# Patient Record
Sex: Male | Born: 1960 | Race: White | Hispanic: No | Marital: Single | Smoking: Current every day smoker
Health system: Southern US, Community
[De-identification: ages and names within clinical notes are randomized; demographics above are authoritative.]

## PROBLEM LIST (undated history)

## (undated) DIAGNOSIS — F1910 Other psychoactive substance abuse, uncomplicated: Secondary | ICD-10-CM

## (undated) DIAGNOSIS — F4310 Post-traumatic stress disorder, unspecified: Secondary | ICD-10-CM

## (undated) DIAGNOSIS — R51 Headache: Secondary | ICD-10-CM

## (undated) DIAGNOSIS — F419 Anxiety disorder, unspecified: Secondary | ICD-10-CM

## (undated) DIAGNOSIS — E785 Hyperlipidemia, unspecified: Secondary | ICD-10-CM

## (undated) DIAGNOSIS — G4700 Insomnia, unspecified: Secondary | ICD-10-CM

## (undated) DIAGNOSIS — F99 Mental disorder, not otherwise specified: Secondary | ICD-10-CM

## (undated) DIAGNOSIS — I2510 Atherosclerotic heart disease of native coronary artery without angina pectoris: Secondary | ICD-10-CM

## (undated) DIAGNOSIS — I10 Essential (primary) hypertension: Secondary | ICD-10-CM

## (undated) DIAGNOSIS — I219 Acute myocardial infarction, unspecified: Secondary | ICD-10-CM

## (undated) DIAGNOSIS — F329 Major depressive disorder, single episode, unspecified: Secondary | ICD-10-CM

## (undated) DIAGNOSIS — F1010 Alcohol abuse, uncomplicated: Secondary | ICD-10-CM

## (undated) DIAGNOSIS — F32A Depression, unspecified: Secondary | ICD-10-CM

## (undated) DIAGNOSIS — C4490 Unspecified malignant neoplasm of skin, unspecified: Secondary | ICD-10-CM

## (undated) HISTORY — PX: TONSILLECTOMY AND ADENOIDECTOMY: SUR1326

## (undated) HISTORY — DX: Insomnia, unspecified: G47.00

---

## 2003-09-01 ENCOUNTER — Other Ambulatory Visit: Payer: Self-pay

## 2005-06-25 ENCOUNTER — Inpatient Hospital Stay: Payer: Self-pay | Admitting: Internal Medicine

## 2005-06-25 ENCOUNTER — Other Ambulatory Visit: Payer: Self-pay

## 2005-12-01 ENCOUNTER — Inpatient Hospital Stay: Payer: Self-pay | Admitting: Internal Medicine

## 2007-01-03 ENCOUNTER — Ambulatory Visit: Payer: Self-pay | Admitting: Family Medicine

## 2008-09-21 ENCOUNTER — Emergency Department: Payer: Self-pay | Admitting: Internal Medicine

## 2008-09-23 ENCOUNTER — Emergency Department: Payer: Self-pay | Admitting: Emergency Medicine

## 2011-05-29 ENCOUNTER — Encounter (HOSPITAL_COMMUNITY): Payer: Self-pay | Admitting: *Deleted

## 2011-05-29 ENCOUNTER — Other Ambulatory Visit: Payer: Self-pay

## 2011-05-29 ENCOUNTER — Inpatient Hospital Stay (HOSPITAL_COMMUNITY)
Admission: EM | Admit: 2011-05-29 | Discharge: 2011-06-02 | DRG: 247 | Disposition: A | Discharge status: Home / Self Care | Payer: 59 | Attending: Cardiology | Admitting: Cardiology

## 2011-05-29 ENCOUNTER — Encounter (HOSPITAL_COMMUNITY)
Admission: EM | Disposition: A | Discharge status: Home / Self Care | Payer: Self-pay | Source: Home / Self Care | Attending: Cardiology

## 2011-05-29 ENCOUNTER — Emergency Department (HOSPITAL_COMMUNITY): Payer: Self-pay

## 2011-05-29 DIAGNOSIS — I213 ST elevation (STEMI) myocardial infarction of unspecified site: Secondary | ICD-10-CM | POA: Diagnosis present

## 2011-05-29 DIAGNOSIS — I2119 ST elevation (STEMI) myocardial infarction involving other coronary artery of inferior wall: Principal | ICD-10-CM | POA: Diagnosis present

## 2011-05-29 DIAGNOSIS — Z720 Tobacco use: Secondary | ICD-10-CM | POA: Diagnosis present

## 2011-05-29 DIAGNOSIS — F17200 Nicotine dependence, unspecified, uncomplicated: Secondary | ICD-10-CM | POA: Diagnosis present

## 2011-05-29 DIAGNOSIS — I2510 Atherosclerotic heart disease of native coronary artery without angina pectoris: Secondary | ICD-10-CM | POA: Diagnosis present

## 2011-05-29 DIAGNOSIS — F1011 Alcohol abuse, in remission: Secondary | ICD-10-CM | POA: Diagnosis present

## 2011-05-29 HISTORY — PX: LEFT HEART CATH: SHX5478

## 2011-05-29 HISTORY — DX: Depression, unspecified: F32.A

## 2011-05-29 HISTORY — DX: Mental disorder, not otherwise specified: F99

## 2011-05-29 HISTORY — DX: Anxiety disorder, unspecified: F41.9

## 2011-05-29 HISTORY — DX: Atherosclerotic heart disease of native coronary artery without angina pectoris: I25.10

## 2011-05-29 HISTORY — DX: Unspecified malignant neoplasm of skin, unspecified: C44.90

## 2011-05-29 HISTORY — DX: Headache: R51

## 2011-05-29 HISTORY — DX: Major depressive disorder, single episode, unspecified: F32.9

## 2011-05-29 HISTORY — DX: Alcohol abuse, uncomplicated: F10.10

## 2011-05-29 LAB — BASIC METABOLIC PANEL
BUN: 9 mg/dL (ref 6–23)
CO2: 24 mEq/L (ref 19–32)
Chloride: 103 mEq/L (ref 96–112)
Creatinine, Ser: 1.11 mg/dL (ref 0.50–1.35)
GFR calc Af Amer: 88 mL/min — ABNORMAL LOW (ref >90)
Potassium: 3.2 mEq/L — ABNORMAL LOW (ref 3.5–5.1)

## 2011-05-29 LAB — CK: Total CK: 70 U/L (ref 7–232)

## 2011-05-29 LAB — CBC
HCT: 44.1 % (ref 39.0–52.0)
MCV: 90 fL (ref 78.0–100.0)
Platelets: 228 10*3/uL (ref 150–400)
RBC: 4.9 MIL/uL (ref 4.22–5.81)
WBC: 9.3 10*3/uL (ref 4.0–10.5)

## 2011-05-29 LAB — TROPONIN I: Troponin I: 0.36 ng/mL (ref <0.30)

## 2011-05-29 SURGERY — LEFT HEART CATH
Anesthesia: LOCAL | Laterality: Right

## 2011-05-29 SURGERY — LEFT HEART CATH
Anesthesia: LOCAL | Site: Groin | Laterality: Right

## 2011-05-29 MED ORDER — EPTIFIBATIDE BOLUS VIA INFUSION: 180.0000 ug/kg | Freq: Once | INTRAVENOUS | Status: DC

## 2011-05-29 MED ORDER — NITROGLYCERIN IN D5W 200-5 MCG/ML-% IV SOLN
2.0000 ug/min | INTRAVENOUS | Status: DC
  Administered 2011-05-29: 5 ug/min via INTRAVENOUS

## 2011-05-29 MED ORDER — SODIUM CHLORIDE 0.9 % IJ SOLN
3.0000 mL | Freq: Two times a day (BID) | INTRAMUSCULAR | Status: DC
  Administered 2011-05-30 – 2011-06-01 (×4): 3 mL via INTRAVENOUS

## 2011-05-29 MED ORDER — DIAZEPAM 2 MG PO TABS
2.0000 mg | ORAL_TABLET | ORAL | Status: DC | PRN
  Administered 2011-05-29 – 2011-05-31 (×5): 2 mg via ORAL
  Filled 2011-05-29 (×6): qty 1

## 2011-05-29 MED ORDER — TRAZODONE HCL 150 MG PO TABS
300.0000 mg | ORAL_TABLET | Freq: Every evening | ORAL | Status: DC | PRN
Start: 2011-05-29 — End: 2011-06-02
  Administered 2011-05-30 – 2011-06-01 (×4): 300 mg via ORAL
  Filled 2011-05-29 (×5): qty 2

## 2011-05-29 MED ORDER — NICOTINE 14 MG/24HR TD PT24
14.0000 mg | MEDICATED_PATCH | TRANSDERMAL | Status: DC
  Administered 2011-05-30 – 2011-06-01 (×3): 14 mg via TRANSDERMAL
  Filled 2011-05-29 (×5): qty 1

## 2011-05-29 MED ORDER — HEPARIN SODIUM (PORCINE) 1000 UNIT/ML IJ SOLN
INTRAMUSCULAR | Status: AC
  Filled 2011-05-29: qty 1

## 2011-05-29 MED ORDER — ASPIRIN 325 MG PO TABS
325.0000 mg | ORAL_TABLET | Freq: Once | ORAL | Status: AC
  Administered 2011-05-29: 325 mg via ORAL

## 2011-05-29 MED ORDER — ATROPINE SULFATE 1 MG/ML IJ SOLN
INTRAMUSCULAR | Status: AC
  Filled 2011-05-29: qty 1

## 2011-05-29 MED ORDER — MORPHINE SULFATE 2 MG/ML IJ SOLN
2.0000 mg | Freq: Four times a day (QID) | INTRAMUSCULAR | Status: DC | PRN
  Administered 2011-05-30: 2 mg via INTRAVENOUS
  Administered 2011-05-30: 4 mg via INTRAVENOUS
  Administered 2011-05-30 – 2011-06-01 (×5): 2 mg via INTRAVENOUS
  Filled 2011-05-29 (×6): qty 1
  Filled 2011-05-29: qty 2
  Filled 2011-05-29 (×2): qty 1

## 2011-05-29 MED ORDER — HEPARIN SODIUM (PORCINE) 1000 UNIT/ML IJ SOLN
4000.0000 [IU] | Freq: Once | INTRAMUSCULAR | Status: AC
  Administered 2011-05-29: 4000 [IU] via INTRAVENOUS

## 2011-05-29 MED ORDER — HYDROXYZINE HCL 25 MG PO TABS
50.0000 mg | ORAL_TABLET | Freq: Four times a day (QID) | ORAL | Status: DC | PRN
  Administered 2011-05-30 – 2011-06-02 (×3): 50 mg via ORAL
  Filled 2011-05-29: qty 1
  Filled 2011-05-29: qty 2
  Filled 2011-05-29: qty 1
  Filled 2011-05-29: qty 2

## 2011-05-29 MED ORDER — METOPROLOL TARTRATE 1 MG/ML IV SOLN
INTRAVENOUS | Status: AC
  Filled 2011-05-29: qty 5

## 2011-05-29 MED ORDER — ONDANSETRON HCL 4 MG/2ML IJ SOLN: 4.0000 mg | Freq: Four times a day (QID) | INTRAMUSCULAR | Status: DC | PRN

## 2011-05-29 MED ORDER — SODIUM CHLORIDE 0.9 % IV SOLN
1.0000 mL/kg/h | INTRAVENOUS | Status: AC
  Administered 2011-05-29 – 2011-05-30 (×2): 1 mL/kg/h via INTRAVENOUS

## 2011-05-29 MED ORDER — PAROXETINE HCL 10 MG PO TABS
10.0000 mg | ORAL_TABLET | Freq: Every day | ORAL | Status: DC
  Administered 2011-05-30 – 2011-06-02 (×4): 10 mg via ORAL
  Filled 2011-05-29 (×4): qty 1

## 2011-05-29 MED ORDER — EPTIFIBATIDE 75 MG/100ML IV SOLN
INTRAVENOUS | Status: AC
  Filled 2011-05-29: qty 100

## 2011-05-29 MED ORDER — FENTANYL CITRATE 0.05 MG/ML IJ SOLN
INTRAMUSCULAR | Status: AC
  Administered 2011-05-29: 25 ug
  Filled 2011-05-29: qty 2

## 2011-05-29 MED ORDER — CLOPIDOGREL BISULFATE 300 MG PO TABS
600.0000 mg | ORAL_TABLET | Freq: Once | ORAL | Status: AC
  Administered 2011-05-29: 600 mg via ORAL
  Filled 2011-05-29: qty 2

## 2011-05-29 MED ORDER — MIDAZOLAM HCL 2 MG/2ML IJ SOLN
INTRAMUSCULAR | Status: AC
  Filled 2011-05-29: qty 2

## 2011-05-29 MED ORDER — EPTIFIBATIDE BOLUS VIA INFUSION
180.0000 ug/kg | Freq: Once | INTRAVENOUS | Status: DC
  Filled 2011-05-29: qty 23

## 2011-05-29 MED ORDER — ASPIRIN 81 MG PO CHEW
81.0000 mg | CHEWABLE_TABLET | Freq: Every day | ORAL | Status: DC
  Administered 2011-05-30 – 2011-06-02 (×4): 81 mg via ORAL
  Filled 2011-05-29 (×3): qty 1

## 2011-05-29 MED ORDER — QUETIAPINE FUMARATE 100 MG PO TABS
100.0000 mg | ORAL_TABLET | Freq: Every day | ORAL | Status: DC
  Administered 2011-05-29 – 2011-06-01 (×4): 100 mg via ORAL
  Filled 2011-05-29 (×5): qty 1

## 2011-05-29 MED ORDER — SODIUM CHLORIDE 0.9 % IJ SOLN: 3.0000 mL | INTRAMUSCULAR | Status: DC | PRN

## 2011-05-29 MED ORDER — HEPARIN (PORCINE) IN NACL 100-0.45 UNIT/ML-% IJ SOLN: 14.0000 [IU]/kg/h | Freq: Once | INTRAMUSCULAR | Status: DC

## 2011-05-29 MED ORDER — MORPHINE SULFATE 2 MG/ML IJ SOLN
INTRAMUSCULAR | Status: AC
  Administered 2011-05-29: 2 mg via INTRAVENOUS
  Filled 2011-05-29: qty 1

## 2011-05-29 MED ORDER — FENTANYL CITRATE 0.05 MG/ML IJ SOLN
INTRAMUSCULAR | Status: AC
  Filled 2011-05-29: qty 2

## 2011-05-29 MED ORDER — METOPROLOL TARTRATE 1 MG/ML IV SOLN: 2.5000 mg | Freq: Once | INTRAVENOUS | Status: DC

## 2011-05-29 MED ORDER — PRASUGREL HCL 10 MG PO TABS
10.0000 mg | ORAL_TABLET | Freq: Every day | ORAL | Status: DC
  Administered 2011-05-30 – 2011-06-02 (×4): 10 mg via ORAL
  Filled 2011-05-29 (×4): qty 1

## 2011-05-29 MED ORDER — LIDOCAINE HCL (PF) 1 % IJ SOLN
INTRAMUSCULAR | Status: AC
  Filled 2011-05-29: qty 30

## 2011-05-29 MED ORDER — EPTIFIBATIDE 75 MG/100ML IV SOLN
1.0000 ug/kg/min | INTRAVENOUS | Status: AC
  Administered 2011-05-30: 2 ug/kg/min via INTRAVENOUS
  Administered 2011-05-30: 1 ug/kg/min via INTRAVENOUS
  Administered 2011-05-30: 0.997 ug/kg/min via INTRAVENOUS
  Filled 2011-05-29 (×6): qty 100

## 2011-05-29 MED ORDER — HEPARIN (PORCINE) IN NACL 2-0.9 UNIT/ML-% IJ SOLN
INTRAMUSCULAR | Status: AC
  Filled 2011-05-29: qty 2000

## 2011-05-29 MED ORDER — NITROGLYCERIN 0.2 MG/ML ON CALL CATH LAB
INTRAVENOUS | Status: AC
  Filled 2011-05-29: qty 1

## 2011-05-29 MED ORDER — NITROGLYCERIN IN D5W 200-5 MCG/ML-% IV SOLN
INTRAVENOUS | Status: AC
  Filled 2011-05-29: qty 250

## 2011-05-29 MED ORDER — VILAZODONE HCL 40 MG PO TABS
40.0000 mg | ORAL_TABLET | Freq: Every day | ORAL | Status: DC
Start: 2011-05-30 — End: 2011-06-02
  Administered 2011-05-30 – 2011-06-02 (×4): 40 mg via ORAL
  Filled 2011-05-29 (×4): qty 1

## 2011-05-29 MED ORDER — EPTIFIBATIDE 75 MG/100ML IV SOLN
INTRAVENOUS | Status: AC
  Administered 2011-05-30: 0.997 ug/kg/min via INTRAVENOUS
  Filled 2011-05-29: qty 100

## 2011-05-29 MED ORDER — SODIUM CHLORIDE 0.9 % IV SOLN: 250.0000 mL | INTRAVENOUS | Status: DC

## 2011-05-29 MED ORDER — ACETAMINOPHEN 325 MG PO TABS
650.0000 mg | ORAL_TABLET | ORAL | Status: DC | PRN
  Administered 2011-05-30 – 2011-06-01 (×3): 650 mg via ORAL
  Filled 2011-05-29 (×4): qty 2

## 2011-05-29 MED ORDER — ROSUVASTATIN CALCIUM 40 MG PO TABS
40.0000 mg | ORAL_TABLET | Freq: Every day | ORAL | Status: DC
  Administered 2011-05-29 – 2011-06-01 (×4): 40 mg via ORAL
  Filled 2011-05-29 (×5): qty 1

## 2011-05-29 MED ORDER — METOPROLOL TARTRATE 25 MG PO TABS
25.0000 mg | ORAL_TABLET | Freq: Two times a day (BID) | ORAL | Status: DC
  Administered 2011-05-29 – 2011-05-31 (×4): 25 mg via ORAL
  Filled 2011-05-29 (×4): qty 1

## 2011-05-29 NOTE — ED Notes (Signed)
Family notified pt here

## 2011-05-29 NOTE — Op Note (Signed)
[facility] CARDIAC CATHETERIZATION REPORT  NAME:  Larry French     MRN: [id number] DATE OF BIRTH:  23-May-1960    ADMIT DATE:  05/29/2011  Performing Cardiologist: Marykay Lex, M.D., M.S. Primary Physician: No primary provider on file. Primary Cardiologist:  Marykay Lex , MD, MS  Procedures Performed:  Left Heart Catheterization via 6 Fr Right Common Femoral Artery access  Native Coronary Angiography  IC NTG Injection  Difficult/Complex Percutaneous Coronary Artery Intervention on mid Right Coronary Artery with a 2.25 mm x 16 mm Promus DES; final diameter 2.45 mm  Indication(s): Inferior STEMI  History: 51 y.o. male no prior cardiac history the long standing tobacco abuse history who has noted some right arm and jaw pain over last couple days but has otherwise been in his usual state of health until about 4:00 this afternoon (05/29/2011) when he began having more constant pain radiating to his right arm. After 2 hours his symptoms he called EMS and was given nitroglycerin, improving his pain from 10/10 to 5/10. ECG per EMS demonstrated significant inferior ST elevations consistent with STEMI. Code STEMI was called during transport. He arrived at the emergency room at 1834 hours and ECG  confirmed inferior STEMI. At the time of the activating the code STEMI for this patient there was already 1 patient on the table for a complex ST elevation MI procedure. A second call team was called and for this procedure as well as myself as a backup Interventional Cardiologist. There was a significant delay of at least 35 minutes of the time the patient arrived in the emergency room until there was enough Cath Lab staff available for second team.   Reasons for Non--System Delay:    Prompt arrival the patient emergency room with having to wait for second Cath Lab staff.  Extreme difficulty engaging the Right Coronary Artery with a guide adequate for wiring the  artery.  Despite these delays and ECG with ST elevations upon arrival to the Cath Lab he did have a brief spell where he mentioned that his symptoms were significantly improved and his ECG did so and show some improvement on telemetry. This likely reflected temporary reperfusion following the Heparin and Aspirin/Plavix administered in the Emergency Room.   Consent:  Emergency consent was implied. I did discuss the risks benefits alternatives and indications of procedure with the patient and he did agree to proceed with verbal consent.  Risks / Complications include, but Not limited to: Death, MI, CVA/TIA, VF/VT (with defibrillation), Bradycardia (need for temporary pacer placement), contrast induced nephropathy, bleeding / bruising / hematoma / pseudoaneurysm, vascular or coronary injury (with possible emergent CT or Vascular Surgery), adverse medication reactions, infection.    Procedure: The patient was brought to the 2nd Floor [facility] C[facility]ter[facility] Lab in the fasting state and prepped and draped in the usual sterile fashion for Right groin access. Sterile technique was used including antiseptics, cap, gloves, gown, hand hygiene, mask and sheet.  Skin prep: Chlorhexidine;  Time Out: Verified patient identification, verified procedure, site/side was marked, verified correct patient position, special equipment/implants available, medications/allergies/relevent history reviewed, required imaging and test results available.  Performed  The right femoral head was identified using tactile and fluoroscopic technique.  The right groin was anesthetized with 1% subcutaneous Lidocaine.  The right Common Femoral Artery was accessed using the Modified Seldinger Technique with placement of a antimicrobial bonded/coated single lumen 6 Fr sheath.  The sheath was aspirated and flushed.  A 6 Fr JL4 Catheter was advanced of over a Standard J wire into the ascending Aorta.  The catheter was used to engage  the Left Coronary Artery, and multiple cineangiographic views of the Left Coronary Artery system were performed demonstrating significant lesions in both the mid Circumflex and mid LAD.    Next, a  6 Fr  JR 4 Guide Catheter was advanced of over a  standard J  wire into the ascending Aorta.  The catheter was used to engage the  Right Coronary Artery, and multiple cineangiographic views of the  Right Coronary Artery system were performed -- demonstrating the likely culprit lesion in the mid portion of extremely tortuous diffusely diseased RCA.  Hemodynamics:  Central Aortic Pressure / Mean Aortic Pressure: 139/91 mmHg; 113 mm Hg.  LV Pressure / LV End diastolic Pressure:  137/10 mmHg; 23 mm Hg.  Coronary Angiographic Data:  Left Main:  Ostial 10-20%, large caliber vessel bifurcates normally into the LAD and Circumflex  Left Anterior Descending (LAD):  Large caliber vessel that gives off one major diagonal branch. At this branch point just before and after there is a hazy ulcerated eccentric 60-70% lesion.  1st diagonal (D1):  Small in size but large in distribution;20-40% proximal, 70% mid/distal and the superior branch of this vessel.  Circumflex (LCx):  Large caliber vessel gives rise to a very high small obtuse marginal that is insignificant. On the curved portion just prior to the second Obtuse Marginal there is a focal eccentric 75% lesion. The remainder of the vessel is rise to a large caliber branching OM 3 with a tubular 40-50% lesion in the midportion.  The remainder the circumflex is free of any significant disease in the small AV groove branch there is insignificant.  Right Coronary Artery: This is an extremely tortuous moderate caliber vessel at the origin with a steep shepherd's crook-type band and diffuse 20-40% and maybe in this case up to 50% stenosis in the early midportion with a returns relatively normal at the takeoff of the RV marginal branch. Just beyond this branch there is a  hazy, thrombotic 80-90 % lesion that is the likely culprit lesion for severe escalation of MI.   There is diffuse mild to moderate disease throughout the early mid RCA the distal RCA is moderate to large in caliber with good large distribution with a moderate to half a 3 mm PDA and small moderate posterior lateral system with no significant disease  PERCUTANEOUS CORONARY INTERVENTION PROCEDURE  After reviewing the initial cineangiography images, the culprit lesion(s) was identified, and the decision was made to proceed with percutaneous coronary intervention. Oral Clopidogrel 600 was administered by the emergency room staff. A weight based double bolus of IV Integrilin was administered and the drip was continued until completion of the procedure with the plan to continue for 12-18 hours post procedure.   Heparin boluses were administered after ACT was checked to ensure an ACT of greater than 220 seconds.  Initially the JL4 guide catheter was used however due to the extreme tortuosity and side branches of the vessel is able to pass a wire beyond the lesion even with the use of the predilatation balloon to stabilize the wire. The guide catheter continued to push back on losing support. This guide catheter was exchanged for a 6 [place] AR 1 catheter which is used today safely engage the Right Coronary Artery, and allowed for safe wiring of the culprit lesion.   This is extremely difficult/complex due to the  extreme tortuosity of the RCA requiring multiple guide catheters exchanging wires. After the initial balloon inflation that with the predilatation balloon the guide was extremely deep-seated and upon pulling it back to the ostium guidewire placement was lost. Present impossible to be engaged with this guide catheter to provide enough support to rewired the lesion. Similar situation occurred for both a stent and the predilatation balloon. After predilatation the guide catheter itself was exchanged and for  an AL 0.75 guide catheter which was then used to complete the case. Catheter engagement in place and was extremely difficult to manipulate and managed with resulting in extreme wire bias making scout angiography difficult to interpret. Finally upon completion of the interventional procedure with the wire removed a diagnostic JR 4 catheter was used to perform post-deployment angiography. Intracoronary nitroglycerin she was administered after each balloon catheter use.  Lesion #1:  Mid RCA hazy thrombotic lesion  Pre-PCI Stenosis: 80-90 % Post-PCI Stenosis: 0 %     TIMI 2-3 flow       TIMI 3 flow  Guide Catheter: 6 Fr AR-1, followed by 6 Fr AL0.75   Guidewire: Initial - Prowater, followed by a Luge then finally a Whisper wire   Pre-Dilitation Balloon: Emerge Monorail 2.0 mm x  12 mm ; 2 Inflations: 6 Atm for 35 Sec; 8 Atm for 30 Sec Scout angiography did not reveal evidence of dissection or perforation.  However the guide placed and was lost requiring rewiring of the vessel.   Stent: Promus Element DES 2.25 mm x 16 mm ;  Deployment: 12 Atm for 19 Sec Scout angiography did reveal evidence of dissection or perforation.  Post-Dilitation Balloon: [place] Quantum Apex 2.5 mm 12 mm; 1st Inflation: 10 Atm for  10 Sec; final diameter 2.45 mm   Post deployment angiography in multiple angiographic projections using a 6 Fr JR 4 diagnostic catheter --  did not reveal evidence of dissection or perforation. The catheter was then removed the body over wire.   The patient was transported to the  CCU in hemodynamically stable, chest pain-free condition, where the sheath will be removed after ACT checked with manual pressure held for hemostasis.  The patient  was stable before, during and following the procedure.  The Patient did tolerate procedure well.  There were not complications.  EBL: < 20 mL  Medications:  Sedation:  4 mg IV Versed,  150 IV mcg Fentanyl  Contrast:  260 ml Omnipaque  Integrilin double  bolus and drip (weight based) was administered and continued throughout the procedure with the plan to continue for 18 hours post procedure  IV Heparin boluses of 2 to and 1000 units in addition to the 5000 units given in the emergency room her minister to keep an ACT greater than 220 seconds.  0.5 mg IV atropine  2.5 mg IV metoprolol  Intracoronary Nitroglycerin: 4 injections of 200 mg each  Impression:  Severe multivessel coronary disease with discrete lesions in all 3 major coronary arteries.  70% focal Proximal/Mid Circumflex, 70% eccentric ulcerated plaque in the mid LAD at the takeoff of the major diagonal branch with notable disease in his diagonal branch that is not PCI amenable.  Culprit lesion is a mid RCA hazy thrombotic 80-90% lesion successfully treated with Percutaneous Coronary Intervention using a Promus Element DES 2.25 mm x 16 mm postdilated to 2.45 mm.  Mildly elevated LVEDP.  Plan:   Admit to CCU, continue Integrilin overnight until Effient is given in the morning - converting from  Plavix in the emergency room, given the extent of his disease.  Would not fast track based on his multivessel disease.   Sheath were removed according to ACT.  Initiate beta blocker and statin therapy. Dual Antiplatelet therapy for at least one year but likely longer based on multivessel PCI plan.  Check a 2-D echocardiogram in the morning to assess his EF and wall motion.  He does have the residual lesions in the LAD and Circumflex. I will consider bring him back to the Cath Lab on Tuesday, February 12 for staged revascularization of the LAD and Circumflex lesions. Initial evaluation of his lesions all 3 are PCI amenable. He is nondiabetic and given his age we discussed the possibility of bypass surgery versus multivessel stenting and he was in favor proceeding multivessel stenting. Based on his home situation I feel is best to complete his revascularization prior to discharge. I am concerned  about the ulcerated LAD plaque.   The case and results was discussed with the patient (and family).  The case and results was discussed with the patient's Cardiologist.  Time Spend Directly with Patient:  180 minutes  HARDING,DAVID W, M.D., M.S. [facility] [address] [place], [place]  [address]  [phone]

## 2011-05-29 NOTE — H&P (Signed)
CARDIOLOGY ADMISSION NOTE  Patient ID: Larry French MRN: [id number] DOB/AGE: 25-May-1960 51 y.o.  Admit date: 05/29/2011 Primary Physician   None Primary Cardiologist   None Chief Complaint    Chest pain    HPI: The patient has not cardiac history. He does have a history of long-standing tobacco abuse. Today he was dancing to some music. He developed substernal chest discomfort. It is 10 out of 10. He radiates down his right arm and up into his jaw. It has been constant. He was nauseated with dry heaves. He was diaphoretic. He was somewhat short of breath. It never had this kind of discomfort before. EMS was called and he was found to have acute inferior ST elevation. He is currently having 8/10 chance chest pain. He's been given aspirin, morphine, fentanyl and is on heparin. He said sublabel nitroglycerin.  Past Medical History  Diagnosis Date  . Alcohol abuse     Recovered x 15 months    Past Surgical History  Procedure Date  . Tonsillectomy and adenoidectomy     No Known Allergies    Medications (Home)    Paxil 10 mg by mouth daily    Seroquel 100 mg by mouth each bedtime    Trazodone 300 mg each bedtime    Vilazodone  40 mg po daily  History   Social History  . Marital Status: Unknown    Spouse Name: [name]    Number of Children: 0  . Years of Education: [name]   Occupational History  . Student    Social History Main Topics  . Smoking status: Current Everyday Smoker -- 1.0 packs/day for 30 years    Types: Cigarettes  . Smokeless tobacco: Not on file  . Alcohol Use: No  . Drug Use: Not on file  . Sexually Active: Not on file   Other Topics Concern  . Not on file   Social History Narrative  . No narrative on file    Family History  Problem Relation Age of Onset  . Coronary artery disease Maternal Grandfather 34    Died    ROS:  As stated in the HPI and negative for all other systems.  Physical Exam: Blood pressure 114/62, pulse 79, temperature 98.2 F (36.8  C), temperature source Oral, resp. rate 18, weight 95.255 kg (210 lb), SpO2 100.00%.  GENERAL:  Well appearing HEENT:  Pupils equal round and reactive, fundi not visualized, oral mucosa unremarkable NECK:  No jugular venous distention, waveform within normal limits, carotid upstroke brisk and symmetric, no bruits, no thyromegaly LYMPHATICS:  No cervical, inguinal adenopathy LUNGS:  Clear to auscultation bilaterally BACK:  No CVA tenderness CHEST:  Unremarkable HEART:  PMI not displaced or sustained,S1 and S2 within normal limits, no S3, no S4, no clicks, no rubs, no murmurs ABD:  Flat, positive bowel sounds normal in frequency in pitch, no bruits, no rebound, no guarding, no midline pulsatile mass, no hepatomegaly, no splenomegaly EXT:  2 plus pulses throughout, no edema, no cyanosis no clubbing SKIN:  No rashes no nodules NEURO:  Cranial nerves II through XII grossly intact, motor grossly intact throughout PSYCH:  Cognitively intact, oriented to person place and time  Labs: Lab Results  Component Value Date   WBC 9.3 05/29/2011   HGB 15.2 05/29/2011   HCT 44.1 05/29/2011   MCV 90.0 05/29/2011   PLT 228 05/29/2011    Radiology:  CXR:  No acute pulmonary disease  05/29/2011  EKG:  05/29/2011  NSR acute inferior  ST elevation 5 mm II, III, aVF  ASSESSMENT AND PLAN:   1)  Acute inferior MI:  Urgently to the cath lab with Dr. Herbie Baltimore  2)  Tobacco:  Stop smoking consult  3)  Substance abuse/psych:  Continue previous medications  4)  Risk reduction:  Fasting lipids and statins  Signed: Rollene Rotunda 05/29/2011, 7:08 PM

## 2011-05-29 NOTE — ED Notes (Signed)
Patient c/o cp starting at 1630, right sided chest pain radiating into right arm, patient states diaphoresis, n/v associated.

## 2011-05-29 NOTE — ED Notes (Signed)
Pt to cath lab.

## 2011-05-29 NOTE — ED Provider Notes (Signed)
History     CSN: [id number]  Arrival date & time 05/29/11  4098   First MD Initiated Contact with Patient 05/29/11 1836      Chief Complaint  Patient presents with  . Chest Pain    stemi    (Consider location/radiation/quality/duration/timing/severity/associated sxs/prior treatment) HPI Comments: Has been having chest pain intermittently for 2 weeks. Approximately 2 hours prior to calling EMS he began having chest pain and it has been constant since onset. Pain associated with diaphoresis. Given nitro per EMS with improvement of pain from 10/10 to 5/10.   Patient is a 51 y.o. male presenting with chest pain. The history is provided by the patient and the EMS personnel.  Chest Pain Episode onset: about  2 weeks ago. At its most intense, the pain is at 10/10. The pain is currently at 5/10. The quality of the pain is described as tightness. The pain radiates to the right shoulder and right arm. Chest pain is worsened by exertion. Primary symptoms include shortness of breath and nausea. Pertinent negatives for primary symptoms include no fever, no fatigue, no syncope, no cough, no wheezing, no palpitations, no abdominal pain, no vomiting and no dizziness.  Associated symptoms include diaphoresis.  Pertinent negatives for associated symptoms include no claudication, no lower extremity edema, no numbness and no weakness. He tried aspirin for the symptoms. Risk factors include no known risk factors.  Pertinent negatives for past medical history include no seizures. Past medical history comments: history of alcohol abuse  Procedure history is negative for cardiac catheterization and stress echo.     No past medical history on file.  No past surgical history on file.  No family history on file.  History  Substance Use Topics  . Smoking status: Not on file  . Smokeless tobacco: Not on file  . Alcohol Use: Not on file      Review of Systems  Constitutional: Positive for diaphoresis.  Negative for fever, chills, activity change, appetite change and fatigue.  HENT: Negative for congestion, sore throat, rhinorrhea, neck pain and neck stiffness.   Eyes: Negative for photophobia, redness and visual disturbance.  Respiratory: Positive for chest tightness and shortness of breath. Negative for cough and wheezing.   Cardiovascular: Positive for chest pain. Negative for palpitations, claudication, leg swelling and syncope.  Gastrointestinal: Positive for nausea. Negative for vomiting, abdominal pain, diarrhea, constipation and blood in stool.  Genitourinary: Negative for dysuria, urgency, hematuria and flank pain.  Musculoskeletal: Negative for back pain.  Skin: Negative for rash and wound.  Neurological: Negative for dizziness, seizures, facial asymmetry, speech difficulty, weakness, light-headedness, numbness and headaches.  Psychiatric/Behavioral: Negative for confusion.  All other systems reviewed and are negative.    Allergies  Review of patient's allergies indicates not on file.  Home Medications  No current outpatient prescriptions on file.  BP 145/95  Pulse 66  Temp(Src) 98.2 F (36.8 C) (Oral)  Resp 18  Wt 210 lb (95.255 kg)  SpO2 99%  Physical Exam  Nursing note and vitals reviewed. Constitutional: He is oriented to person, place, and time. He appears well-developed and well-nourished.  Non-toxic appearance. He appears distressed (mild due to pain).  HENT:  Head: Normocephalic and atraumatic.  Mouth/Throat: Oropharynx is clear and moist.  Eyes: Conjunctivae and EOM are normal. Pupils are equal, round, and reactive to light. No scleral icterus.  Neck: Normal range of motion. Neck supple. No JVD present.  Cardiovascular: Normal rate, regular rhythm, normal heart sounds and intact distal pulses.  No murmur heard. Pulmonary/Chest: Effort normal and breath sounds normal. No respiratory distress. He has no wheezes. He has no rales.  Abdominal: Soft. Bowel  sounds are normal. He exhibits no distension. There is no tenderness. There is no rebound and no guarding.  Musculoskeletal: Normal range of motion. He exhibits no edema.  Neurological: He is alert and oriented to person, place, and time. He has normal strength. No cranial nerve deficit. GCS eye subscore is 4. GCS verbal subscore is 5. GCS motor subscore is 6.  Skin: Skin is warm and dry. No rash noted. He is not diaphoretic.  Psychiatric: He has a normal mood and affect.    ED Course  Procedures (including critical care time)   Date: 05/29/2011  Rate: 59  Rhythm: sinus bradycardia  QRS Axis: normal  Intervals: normal  ST/T Wave abnormalities: ST elevations inferiorly  Conduction Disutrbances:none  Narrative Interpretation: Acute inferior MI.   Old EKG Reviewed: none available    Labs Reviewed  CBC  TROPONIN I  BASIC METABOLIC PANEL  CK   Dg Chest Port 1 View  05/29/2011  *RADIOLOGY REPORT*  Clinical Data: Abdominal pain  PORTABLE CHEST - 1 VIEW  Comparison: None.  Findings: Heart size is mildly enlarged.  Vascularity is normal. Negative for pneumonia or effusion.  Lungs are clear  IMPRESSION: No active cardiopulmonary disease.  Original Report Authenticated By: Camelia Phenes, M.D.     1. Inferior MI       MDM  51yo CM with PMH significant for depression and remote hx alcohol abuse who presents to the ED as a code STEMI from home. EKG with 3-60mm STE in inferior leads. EMS gave 2 NTG tabs with improvement of pain from 10 to 5. No hx GIB or melena. Giving ASA, plavix, and heparin bolus followed by gtt. Giving additional SL nitro. 2 patients in cath lab. Pt staying in ED for now.   At 6:51 PM no improvement with additional SL nitro. Giving 4mg  morphine. Starting nitro gtt.   CXR w/o mediastinal widening or infiltrate.   At 6:55 PM cardiologist Dr. Delene Loll at bedside.   Pt taken to cath lab.    Verne Carrow, MD 05/29/11 1919

## 2011-05-30 ENCOUNTER — Other Ambulatory Visit: Payer: Self-pay

## 2011-05-30 LAB — CARDIAC PANEL(CRET KIN+CKTOT+MB+TROPI)
Relative Index: 12.3 — ABNORMAL HIGH (ref 0.0–2.5)
Relative Index: 12.7 — ABNORMAL HIGH (ref 0.0–2.5)
Total CK: 563 U/L — ABNORMAL HIGH (ref 7–232)
Total CK: 853 U/L — ABNORMAL HIGH (ref 7–232)
Total CK: 592 U/L — ABNORMAL HIGH (ref 7–232)
Troponin I: >25 ng/mL (ref <0.30)

## 2011-05-30 LAB — BASIC METABOLIC PANEL
Calcium: 8.9 mg/dL (ref 8.4–10.5)
GFR calc Af Amer: >90 mL/min (ref >90)
GFR calc non Af Amer: >90 mL/min (ref >90)
Potassium: 3.8 mEq/L (ref 3.5–5.1)
Sodium: 140 mEq/L (ref 135–145)

## 2011-05-30 LAB — POCT ACTIVATED CLOTTING TIME: Activated Clotting Time: 89 seconds

## 2011-05-30 LAB — CBC
MCHC: 32.9 g/dL (ref 30.0–36.0)
Platelets: 227 10*3/uL (ref 150–400)
RDW: 14.3 % (ref 11.5–15.5)

## 2011-05-30 LAB — MRSA PCR SCREENING: MRSA by PCR: POSITIVE — AB

## 2011-05-30 MED ORDER — MUPIROCIN 2 % EX OINT
1.0000 "application " | TOPICAL_OINTMENT | Freq: Two times a day (BID) | CUTANEOUS | Status: DC
  Administered 2011-05-30 – 2011-06-01 (×5): 1 via NASAL
  Filled 2011-05-30: qty 22

## 2011-05-30 MED ORDER — POTASSIUM CHLORIDE CRYS ER 20 MEQ PO TBCR
40.0000 meq | EXTENDED_RELEASE_TABLET | Freq: Once | ORAL | Status: AC
  Administered 2011-05-30: 40 meq via ORAL
  Filled 2011-05-30: qty 2

## 2011-05-30 MED ORDER — CHLORHEXIDINE GLUCONATE CLOTH 2 % EX PADS
6.0000 | MEDICATED_PAD | Freq: Every day | CUTANEOUS | Status: DC
  Administered 2011-05-30: 6 via TOPICAL

## 2011-05-30 NOTE — ED Provider Notes (Signed)
I saw and evaluated the patient, reviewed the resident's note and I agree with the findings and plan. ekg interpretation (reviewed by me)  Inferior STEMI. CP since approx 1630, currently having chest pain. Heparin, plavix, asa ordered in ED while awaiting cardiology. Dr. Antoine Poche has seen pt in the ED. He was transported to the cath lab.  CRITICAL CARE Performed by: Forbes Cellar   Total critical care time:  Critical care time was exclusive of separately billable procedures and treating other patients.  Critical care was necessary to treat or prevent imminent or life-threatening deterioration.  Critical care was time spent personally by me on the following activities: development of treatment plan with patient and/or surrogate as well as nursing, discussions with consultants, evaluation of patient's response to treatment, examination of patient, obtaining history from patient or surrogate, ordering and performing treatments and interventions, ordering and review of laboratory studies, ordering and review of radiographic studies, pulse oximetry and re-evaluation of patient's condition.   Forbes Cellar, MD 05/30/11 [phone]

## 2011-05-30 NOTE — Progress Notes (Signed)
At 0155, Right femoral arterial line was d/c with any complications, ACT was 100.  Manual pressure was held for 30 minutes. Pressure dsg. Applied. Site began and ended as a level 0. 2+ pedal pulses obtained. Educated pt. To keep head on the pillow and to keep leg straight. Will continue to monitor groin closely.

## 2011-05-30 NOTE — Progress Notes (Deleted)
Dionisios Ricci MRN: [id number] DOB/AGE: 11-06-1960 51 y.o.  Admit date: 05/29/2011 Primary Physician   None Primary Cardiologist   None Chief Complaint    Chest pain    HPI: The patient has not cardiac history. He does have a history of long-standing tobacco abuse. Today he was dancing to some music. He developed substernal chest discomfort. It is 10 out of 10. He radiates down his right arm and up into his jaw. It has been constant. He was nauseated with dry heaves. He was diaphoretic. He was somewhat short of breath. It never had this kind of discomfort before. EMS was called and he was found to have acute inferior ST elevation. He is currently having 8/10 chance chest pain. He's been given aspirin, morphine, fentanyl and is on heparin. He said sublabel nitroglycerin.  Past Medical History  Diagnosis Date  . Alcohol abuse     Recovered x 15 months  . CA - skin cancer   . Mental disorder   . Headache   . Anxiety   . Depression     Past Surgical History  Procedure Date  . Tonsillectomy and adenoidectomy     No Known Allergies    Medications (Home)    Paxil 10 mg by mouth daily    Seroquel 100 mg by mouth each bedtime    Trazodone 300 mg each bedtime    Vilazodone  40 mg po daily  History   Social History  . Marital Status: Unknown    Spouse Name: [name]    Number of Children: 0  . Years of Education: [name]   Occupational History  . Student    Social History Main Topics  . Smoking status: Current Everyday Smoker -- 1.0 packs/day for 30 years    Types: Cigarettes  . Smokeless tobacco: Not on file  . Alcohol Use: No  . Drug Use: No  . Sexually Active: No   Other Topics Concern  . Not on file   Social History Narrative  . No narrative on file    Family History  Problem Relation Age of Onset  . Coronary artery disease Maternal Grandfather 67    Died    ROS:  As stated in the HPI and negative for all other systems.  Physical Exam: Blood pressure 122/71, pulse  71, temperature 98 F (36.7 C), temperature source Oral, resp. rate 12, height 5\' 9"  (1.753 m), weight 216 lb 4.3 oz (98.1 kg), SpO2 93.00%.  GENERAL:  Well appearing. NAD NECK:  No jugular venous distention, waveform within normal limits, carotid upstroke brisk and symmetric, no bruits Resp:  Clear to auscultation bilaterally HEART:  PMI not displaced or sustained,S1 and S2 within normal limits, no S3, no S4, no clicks, no rubs, no murmurs ABD:  Flat, NT/ND, no rebound, no guarding EXT:  2 plus pulses throughout, no edema, no cyanosis no clubbing SKIN:  No rashes no nodules NEURO:  Cranial nerves II through XII grossly intact, motor grossly intact throughout  Labs: Lab Results  Component Value Date   WBC 9.9 05/30/2011   HGB 13.4 05/30/2011   HCT 40.7 05/30/2011   MCV 92.3 05/30/2011   PLT 227 05/30/2011    Radiology:  CXR:  No acute pulmonary disease  05/30/2011  EKG:  05/30/2011  NSR acute inferior ST elevation 5 mm II, III, aVF  ASSESSMENT AND PLAN:   1)  Acute inferior STEMI: s/p RCA stent 2/9 per Dr. Herbie Baltimore. Diffuse disease. LAD ulcerated plaque and Circumflex  dis- both amenable to PCI. Tr- 13. No other lab abn. - Continue Heparin Gtt, ASA, Effient. - D/C plavix and integrilin. - Add BB and started statin.  - 2D Echo pending. - Staged Revasc of LAD and Circumflex on Tuesday per Dr. Herbie Baltimore.  2)  Tobacco abuse: smoking cessation consult  3)  Substance abuse/psych:  Continue previous medications Trazodone, Seoquel, Paxil and Vilazodone  4)  Risk reduction:  Fasting lipids and statins  Signed: Saheed Carrington 05/30/2011, 8:04 AM

## 2011-05-30 NOTE — Progress Notes (Addendum)
[facility]  Subjective: Pt is anxious.  CP  / R arm pain resolved. ECG - STE resolved - evolutionary TWI & Q in III/avF. No further tachycardia  Objective: Vital signs in last 24 hours: Temp:  [98 F (36.7 C)-98.2 F (36.8 C)] 98 F (36.7 C) (02/10 0400) Pulse Rate:  [47-94] 71  (02/10 0500) Resp:  [12-22] 12  (02/10 0500) BP: (112-145)/(62-95) 122/71 mmHg (02/10 0500) SpO2:  [91 %-100 %] 93 % (02/10 0500) Weight:  [95.255 kg (210 lb)-98.1 kg (216 lb 4.3 oz)] 98.1 kg (216 lb 4.3 oz) (02/09 2200) Last BM Date: 05/29/11  Intake/Output from previous day: 02/09 0701 - 02/10 0700 In: 1358.9 [P.O.:360; I.V.:998.9] Out: 1875 [Urine:1875] Intake/Output this shift:    Medications Current Facility-Administered Medications  Medication Dose Route Frequency Provider Last Rate Last Dose  . 0.9 %  sodium chloride infusion  1 mL/kg/hr Intravenous Continuous Marykay Lex, MD 20 mL/hr at 05/30/11 0556 20 mL/hr at 05/30/11 0556  . 0.9 %  sodium chloride infusion  250 mL Intravenous Continuous Marykay Lex, MD      . acetaminophen (TYLENOL) tablet 650 mg  650 mg Oral Q4H PRN Marykay Lex, MD      . aspirin chewable tablet 81 mg  81 mg Oral Daily Marykay Lex, MD      . aspirin tablet 325 mg  325 mg Oral Once Verne Carrow, MD   325 mg at 05/29/11 1840  . atropine 1 MG/ML injection           . Chlorhexidine Gluconate Cloth 2 % PADS 6 each  6 each Topical Q0600 Rollene Rotunda, MD      . clopidogrel (PLAVIX) tablet 600 mg  600 mg Oral Once Verne Carrow, MD   600 mg at 05/29/11 1845  . diazepam (VALIUM) tablet 2 mg  2 mg Oral Q4H PRN Marykay Lex, MD   2 mg at 05/30/11 1610  . eptifibatide (INTEGRILIN) 75 mg / 100 mL infusion  2 mcg/kg/min Intravenous Continuous Marykay Lex, MD 15.2 mL/hr at 05/30/11 0414 2 mcg/kg/min at 05/30/11 0414  . eptifibatide (INTEGRILIN) 75 mg / 100 mL infusion           . eptifibatide (INTEGRILIN) 75 mg / 100 mL  infusion           . fentaNYL (SUBLIMAZE) 0.05 MG/ML injection        25 mcg at 05/29/11 1905  . fentaNYL (SUBLIMAZE) 0.05 MG/ML injection           . fentaNYL (SUBLIMAZE) 0.05 MG/ML injection           . heparin 1000 UNIT/ML injection           . heparin 2-0.9 UNIT/ML-% infusion           . heparin injection 4,000 Units  4,000 Units Intravenous Once Verne Carrow, MD   4,000 Units at 05/29/11 1841  . hydrOXYzine (ATARAX/VISTARIL) tablet 50 mg  50 mg Oral Q6H PRN Marykay Lex, MD      . lidocaine (XYLOCAINE) 1 % injection           . metoprolol (LOPRESSOR) 1 MG/ML injection           . metoprolol tartrate (LOPRESSOR) tablet 25 mg  25 mg Oral BID Marykay Lex, MD   25 mg at 05/29/11 2312  . midazolam (VERSED) 2 MG/2ML injection           .  midazolam (VERSED) 2 MG/2ML injection           . midazolam (VERSED) 2 MG/2ML injection           . morphine 2 MG/ML injection 2-4 mg  2-4 mg Intravenous Q6H PRN Marykay Lex, MD   2 mg at 05/30/11 0202  . morphine 2 MG/ML injection        2 mg at 05/29/11 1850  . mupirocin ointment (BACTROBAN) 2 % 1 application  1 application Nasal BID Rollene Rotunda, MD      . nicotine (NICODERM CQ - dosed in mg/24 hours) patch 14 mg  14 mg Transdermal Q24H Marykay Lex, MD      . nitroGLYCERIN (NTG ON-CALL) 0.2 mg/mL injection           . nitroGLYCERIN 0.2 mg/mL in dextrose 5 % infusion           . ondansetron (ZOFRAN) injection 4 mg  4 mg Intravenous Q6H PRN Marykay Lex, MD      . PARoxetine (PAXIL) tablet 10 mg  10 mg Oral Daily Marykay Lex, MD      . potassium chloride SA (K-DUR,KLOR-CON) CR tablet 40 mEq  40 mEq Oral Once Rollene Rotunda, MD   40 mEq at 05/30/11 0100  . prasugrel (EFFIENT) tablet 10 mg  10 mg Oral Daily Marykay Lex, MD      . QUEtiapine (SEROQUEL) tablet 100 mg  100 mg Oral QHS Marykay Lex, MD   100 mg at 05/29/11 2311  . rosuvastatin (CRESTOR) tablet 40 mg  40 mg Oral q1800 Marykay Lex, MD   40 mg at 05/29/11 2312    . sodium chloride 0.9 % injection 3 mL  3 mL Intravenous Q12H Marykay Lex, MD      . sodium chloride 0.9 % injection 3 mL  3 mL Intravenous PRN Marykay Lex, MD      . traZODone (DESYREL) tablet 300 mg  300 mg Oral QHS PRN Marykay Lex, MD   300 mg at 05/30/11 0232  . Vilazodone HCl (VIIBRYD) TABS 40 mg  40 mg Oral Daily Marykay Lex, MD      . DISCONTD: eptifibatide (INTEGRILIN) 0.75 mg/mL bolus via infusion 17,200 mcg  180 mcg/kg Intravenous Once Marykay Lex, MD      . DISCONTD: eptifibatide (INTEGRILIN) 0.75 mg/mL bolus via infusion 17,200 mcg  180 mcg/kg Intravenous Once Marykay Lex, MD      . DISCONTD: heparin ADULT infusion 100 units/mL (25000 units/250 mL)  14 Units/kg/hr Intravenous Once Verne Carrow, MD      . DISCONTD: metoprolol (LOPRESSOR) injection 2.5 mg  2.5 mg Intravenous Once Forbes Cellar, MD      . DISCONTD: nitroGLYCERIN 0.2 mg/mL in dextrose 5 % infusion  2-200 mcg/min Intravenous Titrated Verne Carrow, MD   5 mcg/min at 05/29/11 1851    PE: General appearance: alert, cooperative and no distress Lungs: Decreased BS bilaterally. Heart: regular rate and rhythm, S1, S2 normal, no murmur, click, rub or gallop Abd:  BS + Extremities: No LEE; R groin access site - dressing c/d/i, no bruit & non-tender. Pulses: DPs 2+ and symmetric, 1+ radials  Lab Results:   Basename 05/30/11 0531 05/29/11 1840  WBC 9.9 9.3  HGB 13.4 15.2  HCT 40.7 44.1  PLT 227 228   BMET  Basename 05/30/11 0531 05/29/11 1840  NA 140 139  K 3.8 3.2*  CL 107  103  CO2 23 24  GLUCOSE 112* 151*  BUN 8 9  CREATININE 0.93 1.11  CALCIUM 8.9 9.8   PT/INR  Cholesterol  Cardiac Enzymes Troponin I 0.36,  12.99   Studies/Results: Studies/Results: Hemodynamics:  Central Aortic Pressure / Mean Aortic Pressure: 139/91 mmHg; 113 mm Hg.  LV Pressure / LV End diastolic Pressure: 137/10 mmHg; 23 mm Hg.  Coronary Angiographic Data:  Left Main: Ostial 10-20%, large caliber  vessel bifurcates normally into the LAD and Circumflex  Left Anterior Descending (LAD): Large caliber vessel that gives off one major diagonal branch. At this branch point just before and after there is a hazy ulcerated eccentric 60-70% lesion.  1st diagonal (D1): Small in size but large in distribution;20-40% proximal, 70% mid/distal and the superior branch of this vessel.  Circumflex (LCx): Large caliber vessel gives rise to a very high small obtuse marginal that is insignificant. On the curved portion just prior to the second Obtuse Marginal there is a focal eccentric 75% lesion. The remainder of the vessel is rise to a large caliber branching OM 3 with a tubular 40-50% lesion in the midportion.  The remainder the circumflex is free of any significant disease in the small AV groove branch there is insignificant.  Right Coronary Artery: This is an extremely tortuous moderate caliber vessel at the origin with a steep shepherd's crook-type band and diffuse 20-40% and maybe in this case up to 50% stenosis in the early midportion with a returns relatively normal at the takeoff of the RV marginal branch. Just beyond this branch there is a hazy, thrombotic 80-90 % lesion that is the likely culprit lesion for severe escalation of MI.  There is diffuse mild to moderate disease throughout the early mid RCA the distal RCA is moderate to large in caliber with good large distribution with a moderate to half a 3 mm PDA and small moderate posterior lateral system with no significant disease PERCUTANEOUS CORONARY INTERVENTION PROCEDURE  Lesion #1: Mid RCA hazy thrombotic lesion  Pre-PCI Stenosis: 80-90 % Post-PCI Stenosis: 0 %  TIMI 2-3 flow TIMI 3 flow  Guide Catheter: 6 Fr AR-1, followed by 6 Fr AL0.75  Guidewire: Initial - Prowater, followed by a Luge then finally a Whisper wire Pre-Dilitation Balloon: Emerge Monorail 2.0 mm x 12 mm ; 2 Inflations: 6 Atm for 35 Sec; 8 Atm for 30 Sec Scout angiography did not  reveal evidence of dissection or perforation. However the guide placed and was lost requiring rewiring of the vessel.  Stent: Promus Element DES 2.25 mm x 16 mm ; Deployment: 12 Atm for 19 Sec Scout angiography did reveal evidence of dissection or perforation.  Post-Dilitation Balloon: [place] Quantum Apex 2.5 mm 12 mm; 1st Inflation: 10 Atm for 10 Sec; final diameter 2.45 mm  Impression:  Severe multivessel coronary disease with discrete lesions in all 3 major coronary arteries. 70% focal Proximal/Mid Circumflex, 70% eccentric ulcerated plaque in the mid LAD at the takeoff of the major diagonal branch with notable disease in his diagonal branch that is not PCI amenable.  Culprit lesion is a mid RCA hazy thrombotic 80-90% lesion successfully treated with Percutaneous Coronary Intervention using a Promus Element DES 2.25 mm x 16 mm postdilated to 2.45 mm.  Mildly elevated LVEDP.   PORTABLE CHEST - 1 VIEW  Comparison: None.  Findings: Heart size is mildly enlarged. Vascularity is normal.  Negative for pneumonia or effusion. Lungs are clear  IMPRESSION:  No active cardiopulmonary disease  Echo ordered -  not yet done.  Assessment/Plan    Principal Problem:  *Inferior MI-STEMI Active Problems:   CAD- multivessel - mid LAD ulcerated ~70%, mid LCx 75% focal eccentric Tobacco abuse History of ETOH abuse  Plan:  S/P emergent left heart cath and PCI to the Mid RCA with a Promus DES.  Possible staged intervention to LAD and Circ.  ASA/ Effient/ lopressor/crestor.  K+ repleted.  Nicotine patch.   LOS: 1 day    HAGER,BRYAN W 05/30/2011 8:09 AM  I have seen & examined the patient this AM along with Mr. Leron Croak, [place].  I agree with his note as written.  Stable post complex PCI of mRCA for Inf STEMI.   Continue Integrelin gtt at reduced rate for additional 24 hrs once Prasugrel dose given due to ulcerated LAD plaque.  Continue BB - titrate up as able  Statin on board.  With significant Anxiety  / Depression history - will continue home regimen with the addition of prn Bzds & morphine.  Bzds given his extensive EtOH history -- although he does note ~4months sobriety.  Nicotine patch  Replete electrolytes  Follow-up echo.  I will review films with my partners, but will most likley proceed with staged PCI of mLAD & mCx lesion on Tuesday.    Marykay Lex, M.D., M.S. [facility] [address] [place], [place]  [address]  [phone]  05/30/2011 8:45 AM

## 2011-05-31 MED ORDER — DIAZEPAM 5 MG PO TABS
5.0000 mg | ORAL_TABLET | ORAL | Status: DC | PRN
  Administered 2011-05-31 (×3): 5 mg via ORAL
  Filled 2011-05-31 (×3): qty 1

## 2011-05-31 MED ORDER — DIAZEPAM 5 MG PO TABS: 5.0000 mg | ORAL_TABLET | ORAL | Status: DC

## 2011-05-31 MED ORDER — SODIUM CHLORIDE 0.9 % IV SOLN: 250.0000 mL | INTRAVENOUS | Status: DC | PRN

## 2011-05-31 MED ORDER — SODIUM CHLORIDE 0.9 % IJ SOLN: 3.0000 mL | Freq: Two times a day (BID) | INTRAMUSCULAR | Status: DC

## 2011-05-31 MED ORDER — SODIUM CHLORIDE 0.9 % IV SOLN
1.0000 mL/kg/h | INTRAVENOUS | Status: DC
  Administered 2011-05-31: 1 mL/kg/h via INTRAVENOUS

## 2011-05-31 MED ORDER — SODIUM CHLORIDE 0.9 % IJ SOLN: 3.0000 mL | INTRAMUSCULAR | Status: DC | PRN

## 2011-05-31 MED ORDER — SODIUM CHLORIDE 0.9 % IV SOLN: 1.0000 mL/kg/h | INTRAVENOUS | Status: DC

## 2011-05-31 MED ORDER — DIAZEPAM 5 MG PO TABS
5.0000 mg | ORAL_TABLET | ORAL | Status: AC
  Administered 2011-06-01: 5 mg via ORAL
  Filled 2011-05-31: qty 1

## 2011-05-31 MED ORDER — CHLORHEXIDINE GLUCONATE CLOTH 2 % EX PADS
6.0000 | MEDICATED_PAD | Freq: Every day | CUTANEOUS | Status: DC
  Administered 2011-05-31: 6 via TOPICAL

## 2011-05-31 MED ORDER — METOPROLOL TARTRATE 50 MG PO TABS
50.0000 mg | ORAL_TABLET | Freq: Two times a day (BID) | ORAL | Status: DC
  Administered 2011-05-31: 25 mg via ORAL
  Administered 2011-05-31 – 2011-06-01 (×2): 50 mg via ORAL
  Filled 2011-05-31 (×4): qty 1

## 2011-05-31 NOTE — Progress Notes (Signed)
[facility]  Subjective: Right axillary tenderness exacerbated with movement.  Very Anxious.  Objective: Vital signs in last 24 hours: Temp:  [97.5 F (36.4 C)-98.9 F (37.2 C)] 98.2 F (36.8 C) (02/11 0400) Pulse Rate:  [74-82] 80  (02/10 2100) Resp:  [12-23] 16  (02/11 0400) BP: (103-147)/(70-87) 129/74 mmHg (02/11 0700) SpO2:  [93 %-97 %] 97 % (02/11 0400) Last BM Date: 05/29/11  Intake/Output from previous day: 02/10 0701 - 02/11 0700 In: 1647.1 [P.O.:1320; I.V.:327.1] Out: 3425 [Urine:3425] Intake/Output this shift:    Medications Current Facility-Administered Medications  Medication Dose Route Frequency Provider Last Rate Last Dose  . 0.9 %  sodium chloride infusion  250 mL Intravenous Continuous Marykay Lex, MD 10 mL/hr at 05/30/11 1900 250 mL at 05/30/11 1900  . acetaminophen (TYLENOL) tablet 650 mg  650 mg Oral Q4H PRN Marykay Lex, MD   650 mg at 05/30/11 1616  . aspirin chewable tablet 81 mg  81 mg Oral Daily Marykay Lex, MD   81 mg at 05/30/11 9811  . Chlorhexidine Gluconate Cloth 2 % PADS 6 each  6 each Topical Daily Marykay Lex, MD      . diazepam (VALIUM) tablet 2 mg  2 mg Oral Q4H PRN Marykay Lex, MD   2 mg at 05/31/11 0336  . eptifibatide (INTEGRILIN) 75 mg / 100 mL infusion  1 mcg/kg/min Intravenous Continuous Marykay Lex, MD 7.6 mL/hr at 05/30/11 2337 1 mcg/kg/min at 05/30/11 2337  . hydrOXYzine (ATARAX/VISTARIL) tablet 50 mg  50 mg Oral Q6H PRN Marykay Lex, MD   50 mg at 05/31/11 0735  . metoprolol tartrate (LOPRESSOR) tablet 25 mg  25 mg Oral BID Marykay Lex, MD   25 mg at 05/30/11 2124  . morphine 2 MG/ML injection 2-4 mg  2-4 mg Intravenous Q6H PRN Marykay Lex, MD   2 mg at 05/31/11 0735  . mupirocin ointment (BACTROBAN) 2 % 1 application  1 application Nasal BID Rollene Rotunda, MD   1 application at 05/30/11 2125  . nicotine (NICODERM CQ - dosed in mg/24 hours) patch 14 mg  14 mg  Transdermal Q24H Marykay Lex, MD   14 mg at 05/30/11 0830  . ondansetron (ZOFRAN) injection 4 mg  4 mg Intravenous Q6H PRN Marykay Lex, MD      . PARoxetine (PAXIL) tablet 10 mg  10 mg Oral Daily Marykay Lex, MD   10 mg at 05/30/11 9147  . prasugrel (EFFIENT) tablet 10 mg  10 mg Oral Daily Marykay Lex, MD   10 mg at 05/30/11 8295  . QUEtiapine (SEROQUEL) tablet 100 mg  100 mg Oral QHS Marykay Lex, MD   100 mg at 05/30/11 2124  . rosuvastatin (CRESTOR) tablet 40 mg  40 mg Oral q1800 Marykay Lex, MD   40 mg at 05/30/11 1700  . sodium chloride 0.9 % injection 3 mL  3 mL Intravenous Q12H Marykay Lex, MD   3 mL at 05/30/11 2234  . sodium chloride 0.9 % injection 3 mL  3 mL Intravenous PRN Marykay Lex, MD      . traZODone (DESYREL) tablet 300 mg  300 mg Oral QHS PRN Marykay Lex, MD   300 mg at 05/30/11 2231  . Vilazodone HCl (VIIBRYD) TABS 40 mg  40 mg Oral Daily Marykay Lex, MD   40 mg at 05/30/11 6213  . DISCONTD: Chlorhexidine  Gluconate Cloth 2 % PADS 6 each  6 each Topical Q0600 Rollene Rotunda, MD   6 each at 05/30/11 1430    PE: General appearance: alert, cooperative and no distress Lungs: Decreased BS bilaterally,  Mild crackles at bases. Heart: regular rate and rhythm, S1, S2 normal, no murmur, click, rub or gallop Abdomen: Nontender. Extremities: No LEE Pulses: DPs 2+ and symmetric, right radial 1+, left 2+ Right mid axillary tenderness on distal ribs  Lab Results:   Basename 05/30/11 0531 05/29/11 1840  WBC 9.9 9.3  HGB 13.4 15.2  HCT 40.7 44.1  PLT 227 228   BMET  Basename 05/30/11 0531 05/29/11 1840  NA 140 139  K 3.8 3.2*  CL 107 103  CO2 23 24  GLUCOSE 112* 151*  BUN 8 9  CREATININE 0.93 1.11  CALCIUM 8.9 9.8    Assessment/Plan  Principal Problem:  *Inferior MI Active Problems:  Tobacco abuse  History of ETOH abuse  Plan:  S/P LHC for STEMI, Mid RCA, PRomus DES.  Staged intervention to LAD, CIRC tomorrow.  ASA/ Effient/  lopressor/crestor. K+ repleted. Nicotine patch.  Tele: SR 70's - with one run of NSVT last PM (within 48 hr of MI). Increased Valium to 5mg  Q4hr.  BP stable and controlled.   LOS: 2 days    HAGER,BRYAN W 05/31/2011 8:34 AM  I have seen and examined the patient along with Wilburt Finlay, PA. I have reviewed the chart, notes and new data.  I agree with Bryan's note.  Key new complaints: Had one episode of feeling clammy & SOB o/n - may have coincided with NSVT run.  No further Sx - excited to walk Key examination changes: groin with mild bruising Key new findings / data: significantTroponin elevation - will f/u Echo done this AM & recheck EGG  PLAN: Complete ~24 hrs of Integrilin gtt, on ASA/Effient Increase BB dose Continue statin & anxiolytics  Relook Cath with likely staged PCI of mLAD & mLCx in Am.  Performing MD: Marykay Lex, M.D., M.S.  Procedure:  Left Heart Catheterization with coronary angiography and possible percutaneous coronary intervention on 2 separate lesions.  The procedure with Risks/Benefits/Alternatives and Indications was reviewed with the patient and his parents.  All questions were answered.    Risks / Complications include, but not limited to: Death, MI, CVA/TIA, VF/VT (with defibrillation), Bradycardia (need for temporary pacer placement), contrast induced nephropathy, bleeding / bruising / hematoma / pseudoaneurysm, vascular or coronary injury (with possible emergent CT or Vascular Surgery), adverse medication reactions, infection.    The patient voicew understanding and agree to proceed.     Marykay Lex, M.D., M.S. [facility] [address] [place], [place]  [address]  [phone]  05/31/2011 11:09 AM

## 2011-05-31 NOTE — Progress Notes (Signed)
   CARE MANAGEMENT NOTE 05/31/2011  Patient:  Larry French,Larry French   Account Number:  [id number]  Date Initiated:  05/31/2011  Documentation initiated by:  GRAVES-BIGELOW,Siria Calandro  Subjective/Objective Assessment:   Pt admitted with cp. 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 Plan is for d/c on Thursday.     Action/Plan:   Pt will need an effient 30 day free card with a rx for 30 day free no refills and please fill out lilly forms for patient assistance. CM will f/u before Thursday.   Anticipated DC Date:  06/03/2011   Anticipated DC Plan:  HOME/SELF CARE  In-house referral  Clinical Social Leisure centre manager         Choice offered to / List presented to:             Status of service:  In process, will continue to follow Medicare Important Message given?   (If response is "NO", the following Medicare IM given date fields will be blank) Date Medicare IM given:   Date Additional Medicare IM given:    Discharge Disposition:    Per UR Regulation:    Comments:

## 2011-05-31 NOTE — Progress Notes (Signed)
  Echocardiogram 2D Echocardiogram has been performed.  [name] [name] 05/31/2011, 10:09 AM

## 2011-05-31 NOTE — Progress Notes (Signed)
Clinical Social Worker spoke with patient, who was pleasant, regarding his self pay status. Patient is interested in receiving assistance with insurance and hopefully receiving Medicaid. CSW contacted Arline Asp, Artist but had to leave a voicemail. Patient stated that he lives in a residential treatment facility for abuse of xanax. Patient declined CSW making an appointment at a health clinic in [place] for pcp need because he has an appointment at [facility] for medical care on March 1st. Patient lives alone, and has family support. CSW followed up with CM regarding medication need.   Rozetta Nunnery MSW, [employer] [phone]

## 2011-05-31 NOTE — Progress Notes (Signed)
UR Completed. Simmons, Jeilani Grupe F [phone]  

## 2011-05-31 NOTE — Progress Notes (Signed)
CARDIAC REHAB PHASE I   PRE:  Rate/Rhythm: 96 SR  BP:  Supine:   Sitting: 130/82  Standing:    SaO2:   MODE:  Ambulation: 250 ft   POST:  Rate/Rhythem: 95 SR  BP:  Supine:   Sitting: 142/82  Standing:    SaO2:  Pt ambulated well, steady gait slow pace. Pt denied any SOB, CP or leg pain. At Dr. Elissa Hefty request kept walk brief.   Minus [place]

## 2011-05-31 NOTE — Progress Notes (Signed)
Pt smokes 1 ppd and has been for 30 years. He is a recovering alcoholic who's been sober now for 15 months per pt. He verbalizes understanding of the risk factors with smoking and is in contemplation stage because he wants to quit but not right now. Encouraged pt to quit and in the mean time referred to 1-800 quit now for f/u and support. Discussed oral fixation substitutes, second hand smoke and in home smoking policy. Reviewed and gave pt Written education/contact information.

## 2011-06-01 ENCOUNTER — Encounter (HOSPITAL_COMMUNITY)
Admission: EM | Disposition: A | Discharge status: Home / Self Care | Payer: Self-pay | Source: Home / Self Care | Attending: Cardiology

## 2011-06-01 ENCOUNTER — Encounter (HOSPITAL_COMMUNITY): Payer: Self-pay | Admitting: Cardiology

## 2011-06-01 DIAGNOSIS — I2510 Atherosclerotic heart disease of native coronary artery without angina pectoris: Secondary | ICD-10-CM

## 2011-06-01 DIAGNOSIS — I213 ST elevation (STEMI) myocardial infarction of unspecified site: Secondary | ICD-10-CM | POA: Diagnosis present

## 2011-06-01 HISTORY — PX: LEFT HEART CATHETERIZATION WITH CORONARY ANGIOGRAM: SHX5451

## 2011-06-01 HISTORY — DX: Atherosclerotic heart disease of native coronary artery without angina pectoris: I25.10

## 2011-06-01 LAB — POCT ACTIVATED CLOTTING TIME
Activated Clotting Time: 204 seconds
Activated Clotting Time: 221 seconds
Activated Clotting Time: 408 seconds

## 2011-06-01 LAB — LIPID PANEL
Cholesterol: 170 mg/dL (ref 0–200)
HDL: 28 mg/dL — ABNORMAL LOW (ref >39)
LDL Cholesterol: 98 mg/dL (ref 0–99)
Total CHOL/HDL Ratio: 6.1 RATIO
Triglycerides: 221 mg/dL — ABNORMAL HIGH (ref <150)
VLDL: 44 mg/dL — ABNORMAL HIGH (ref 0–40)

## 2011-06-01 LAB — CBC
HCT: 42 % (ref 39.0–52.0)
Hemoglobin: 14.2 g/dL (ref 13.0–17.0)
MCH: 30.7 pg (ref 26.0–34.0)
MCV: 90.9 fL (ref 78.0–100.0)
Platelets: 220 10*3/uL (ref 150–400)
RBC: 4.62 MIL/uL (ref 4.22–5.81)
WBC: 10.8 10*3/uL — ABNORMAL HIGH (ref 4.0–10.5)

## 2011-06-01 LAB — BASIC METABOLIC PANEL
CO2: 25 mEq/L (ref 19–32)
Chloride: 105 mEq/L (ref 96–112)
Glucose, Bld: 99 mg/dL (ref 70–99)
Sodium: 139 mEq/L (ref 135–145)

## 2011-06-01 LAB — PROTIME-INR: INR: 0.96 (ref 0.00–1.49)

## 2011-06-01 SURGERY — PERCUTANEOUS CORONARY STENT INTERVENTION (PCI-S)
Anesthesia: LOCAL

## 2011-06-01 SURGERY — LEFT HEART CATHETERIZATION WITH CORONARY ANGIOGRAM
Anesthesia: LOCAL

## 2011-06-01 MED ORDER — SODIUM CHLORIDE 0.9 % IV SOLN
1.0000 mL/kg/h | INTRAVENOUS | Status: AC
  Administered 2011-06-01: 1 mL/kg/h via INTRAVENOUS

## 2011-06-01 MED ORDER — FENTANYL CITRATE 0.05 MG/ML IJ SOLN
INTRAMUSCULAR | Status: AC
  Filled 2011-06-01: qty 2

## 2011-06-01 MED ORDER — SODIUM CHLORIDE 0.9 % IV SOLN
INTRAVENOUS | Status: DC
  Administered 2011-06-01: 10 mL/h via INTRAVENOUS

## 2011-06-01 MED ORDER — NITROGLYCERIN 0.2 MG/ML ON CALL CATH LAB
INTRAVENOUS | Status: AC
  Filled 2011-06-01: qty 1

## 2011-06-01 MED ORDER — SODIUM CHLORIDE 0.9 % IJ SOLN: 3.0000 mL | INTRAMUSCULAR | Status: DC | PRN

## 2011-06-01 MED ORDER — BIVALIRUDIN 250 MG IV SOLR
INTRAVENOUS | Status: AC
  Filled 2011-06-01: qty 250

## 2011-06-01 MED ORDER — OXYCODONE-ACETAMINOPHEN 5-325 MG PO TABS
1.0000 | ORAL_TABLET | Freq: Four times a day (QID) | ORAL | Status: DC | PRN
  Administered 2011-06-01: 2 via ORAL
  Filled 2011-06-01: qty 2

## 2011-06-01 MED ORDER — MIDAZOLAM HCL 2 MG/2ML IJ SOLN
INTRAMUSCULAR | Status: AC
  Filled 2011-06-01: qty 2

## 2011-06-01 MED ORDER — SODIUM CHLORIDE 0.9 % IV SOLN: 250.0000 mL | INTRAVENOUS | Status: DC

## 2011-06-01 MED ORDER — VERAPAMIL HCL 2.5 MG/ML IV SOLN
INTRAVENOUS | Status: AC
  Filled 2011-06-01: qty 2

## 2011-06-01 MED ORDER — LIDOCAINE HCL (PF) 1 % IJ SOLN
INTRAMUSCULAR | Status: AC
  Filled 2011-06-01: qty 30

## 2011-06-01 MED ORDER — SODIUM CHLORIDE 0.9 % IJ SOLN
3.0000 mL | Freq: Two times a day (BID) | INTRAMUSCULAR | Status: DC
  Administered 2011-06-01 – 2011-06-02 (×2): 3 mL via INTRAVENOUS

## 2011-06-01 MED ORDER — HEPARIN (PORCINE) IN NACL 2-0.9 UNIT/ML-% IJ SOLN
INTRAMUSCULAR | Status: AC
  Filled 2011-06-01: qty 2000

## 2011-06-01 MED ORDER — ACETAMINOPHEN 325 MG PO TABS
650.0000 mg | ORAL_TABLET | Freq: Once | ORAL | Status: DC
  Filled 2011-06-01 (×2): qty 2

## 2011-06-01 NOTE — Progress Notes (Signed)
Subjective:  No chest pain, slept well.  Objective:  Vital Signs in the last 24 hours: Temp:  [97.7 F (36.5 C)-98.2 F (36.8 C)] 98.2 F (36.8 C) (02/12 0000) Pulse Rate:  [73-74] 73  (02/11 1632) Resp:  [16-18] 16  (02/11 2200) BP: (92-126)/(63-90) 108/71 mmHg (02/12 0600) SpO2:  [96 %-98 %] 96 % (02/11 2000)  Intake/Output from previous day:  Intake/Output Summary (Last 24 hours) at 06/01/11 0746 Last data filed at 06/01/11 0600  Gross per 24 hour  Intake 1974.4 ml  Output   3225 ml  Net -1250.6 ml    Physical Exam: General appearance: alert, cooperative and no distress Lungs: clear to auscultation bilaterally Heart: regular rate and rhythm Rt groin echymotic, no hematoma   Rate: 74  Rhythm: normal sinus rhythm  Lab Results:  Basename 06/01/11 0530 05/30/11 0531  WBC 10.8* 9.9  HGB 14.2 13.4  PLT 220 227    Basename 06/01/11 0530 05/30/11 0531  NA 139 140  K 3.8 3.8  CL 105 107  CO2 25 23  GLUCOSE 99 112*  BUN 12 8  CREATININE 1.13 0.93    Basename 05/30/11 1447 05/30/11 0840  TROPONINI >25.00* >25.00*   Hepatic Function Panel No results found for this basename: PROT,ALBUMIN,AST,ALT,ALKPHOS,BILITOT,BILIDIR,IBILI in the last 72 hours No results found for this basename: CHOL in the last 72 hours  Basename 06/01/11 0530  INR 0.96    Imaging: Imaging results have been reviewed  Cardiac Studies:2D- NL LVF  Assessment/Plan:   Principal Problem:  *STEMI, RCA DES 05/28/10 Active Problems:  History of ETOH abuse and Xanax abuse. In rehab pta  CAD, residual CFX LAD disease  Tobacco abuse  Plan-Cath/PCI today. Check lipids, d/c prn Valium.  Larry Shelter PA-C 06/01/2011, 7:46 AM  Had a good night.   Based upon the appearance of the LAD lesion on initial catheterization - ulcerated / irregular, we have treated him aggressively with antiplatelet agents.  I feel that it is necessary to re-look at his coronary arteries for stability.  I am also a bit  concerned about his follow-up with significant anxiety that he is likely to have additional "angina" with the existing lesions. Thankfully his EF is preserved with 83 min door-to-balloon time despite the complex procedure.   The plan is to perform PCI on the 2 remaining lesions to complete his revascularization.   Procedure:  Left Heart Catheterization with planned Percutaneous Coronary Intervention of the LAD & LCircumflex lesions  The procedure with Risks/Benefits/Alternatives and Indications was reviewed with the patient.  All questions were answered.    Risks / Complications include, but not limited to: Death, MI, CVA/TIA, VF/VT (with defibrillation), Bradycardia (need for temporary pacer placement), contrast induced nephropathy, bleeding / bruising / hematoma / pseudoaneurysm, vascular or coronary injury (with possible emergent CT or Vascular Surgery), adverse medication reactions, infection.    The patient voice understanding and agree to proceed.   I have signed the consent form and placed it on the chart for patient signature and RN witness.     Larry French, M.D., M.S. [facility] [address] [place], [place]  [address]  [phone]  06/01/2011 8:34 AM

## 2011-06-01 NOTE — Brief Op Note (Signed)
05/29/2011 - 06/01/2011  3:52 PM  PATIENT:  Larry French  51 y.o. male who is status post inferior STEMI on the evening of 05/29/2011 who underwent emergent PCI of the mid RCA And a Complex Procedure with a Promus Drug Eluding Stent 2.25 mm x 16 mm postdilated to 2.45 mm. During this catheterization is also noted to have an ulcerated plaque in proximal mid LAD with questionable possible stump small Diagonal branch versus a Septal Perforator. He also had a moderate to severe lesion in the Circumflex. Based on the nature of the LAD lesion in the difficulty of the RCA PCI he was brought back to the cardiac catheterization lab to look catheterization of the RCA and plan PCI of the LAD lesion. We then also consider possibly performing PCI on the Circumflex. He has been on aspirin and ST and  PRE-OPERATIVE DIAGNOSIS:  Residual Severe LAD CAD with history of STEMI,  POST-OPERATIVE DIAGNOSIS:    Successful PCI of the proximal and mid LAD and Diagonal bifurcation with a Promus DES 2.75 mm x 20 mm post dilated to 3.0 mm.  Well expanded RCA stent with brisk TIMI 3 flow & residual proximal disease  Mid LCx lesion appears to be 50-60% , therefore no PCI performed.    PROCEDURE:  Procedure(s) (LRB): LEFT HEART CATHETERIZATION WITH CORONARY ANGIOGRAM ([name]) Percutaneous Coronary Intervention of Prox to Mid LAD with a Promus DES 2.75 mm x 20 mm (post dilated to 3.0 mm)   6 French Right Femoral Artery Sheath after failed attempt at access in the Right Radial Artery  JR 4 -- RCA diagnostic  6 French XP 3.5 guide; BMW wire  Predilatation balloon: Emergent Monorail 2.5 mm x 15 mm: 10 atmospheres x30 seconds; posterior to 30 seconds.  Stent: Promus Element 2.75 mm x 20 mm: Deployment at 14 atmospheres x45 seconds; postdilatation 18 atmospheres for 10 seconds. Final diameter: 3.0 mm  SURGEON:  Marykay Lex, MD - Primary  ASSISTANTS: Holli Humbles, RRT   ANESTHESIA:   IV sedation; 4mg  Versed,  Fentanyl OMNIPAQUE: 130 ml  IC NTG 200 mcg  EBL: < 20ml  DICTATION: .Note written in EPIC  PLAN OF CARE: Monitor overnight as PCI anticipate discharge in the morning.  PATIENT DISPOSITION:  PACU - hemodynamically stable. chest pain-free.    Delay start of Pharmacological VTE agent (>24hrs) due to surgical blood loss or risk of bleeding: not applicable

## 2011-06-01 NOTE — Progress Notes (Signed)
Patient's right groin is a level 1 because of a large area of ecchymosis.  His right groin sheath was removed at 1919 and pressure was held for 28 minutes. Hemostasis was achieved and a dry sterile pressure dressing was applied. Groin remains a level 1 post sheath removal.  Right dorsalis pedis pulse remains a 2+.  Color good with good capillary refill.  Patient tolerated procedure well

## 2011-06-02 LAB — CBC
MCV: 91.3 fL (ref 78.0–100.0)
Platelets: 194 10*3/uL (ref 150–400)
RDW: 14.1 % (ref 11.5–15.5)
WBC: 11.1 10*3/uL — ABNORMAL HIGH (ref 4.0–10.5)

## 2011-06-02 LAB — BASIC METABOLIC PANEL
Chloride: 104 mEq/L (ref 96–112)
Creatinine, Ser: 1.02 mg/dL (ref 0.50–1.35)
GFR calc Af Amer: >90 mL/min (ref >90)
Sodium: 136 mEq/L (ref 135–145)

## 2011-06-02 MED ORDER — PRASUGREL HCL 10 MG PO TABS: 10.0000 mg | ORAL_TABLET | Freq: Every day | ORAL | Status: DC

## 2011-06-02 MED ORDER — METOPROLOL TARTRATE 12.5 MG HALF TABLET: 12.5000 mg | ORAL_TABLET | Freq: Two times a day (BID) | ORAL | Status: DC

## 2011-06-02 MED ORDER — METOPROLOL TARTRATE 12.5 MG HALF TABLET
12.5000 mg | ORAL_TABLET | Freq: Two times a day (BID) | ORAL | Status: DC
  Administered 2011-06-02: 12.5 mg via ORAL
  Filled 2011-06-02 (×2): qty 1

## 2011-06-02 MED ORDER — ALPRAZOLAM 0.25 MG PO TABS
0.2500 mg | ORAL_TABLET | Freq: Once | ORAL | Status: AC
  Administered 2011-06-02: 0.25 mg via ORAL

## 2011-06-02 MED ORDER — ASPIRIN 81 MG PO CHEW: 81.0000 mg | CHEWABLE_TABLET | Freq: Every day | ORAL | Status: AC

## 2011-06-02 MED ORDER — ALPRAZOLAM 0.25 MG PO TABS
0.2500 mg | ORAL_TABLET | Freq: Three times a day (TID) | ORAL | Status: DC | PRN
  Filled 2011-06-02: qty 1

## 2011-06-02 MED ORDER — SIMVASTATIN 40 MG PO TABS: 40.0000 mg | ORAL_TABLET | Freq: Every evening | ORAL | Status: DC

## 2011-06-02 MED ORDER — NITROGLYCERIN 0.4 MG SL SUBL: 0.4000 mg | SUBLINGUAL_TABLET | SUBLINGUAL | Status: DC | PRN

## 2011-06-02 MED ORDER — ACETAMINOPHEN 325 MG PO TABS
650.0000 mg | ORAL_TABLET | Freq: Once | ORAL | Status: AC
  Administered 2011-06-02: 650 mg via ORAL

## 2011-06-02 MED FILL — Dextrose Inj 5%: INTRAVENOUS | Qty: 50 | Status: AC

## 2011-06-02 NOTE — Progress Notes (Signed)
   CARE MANAGEMENT NOTE 06/02/2011  Patient:  Larry French,Larry French   Account Number:  [id number]  Date Initiated:  05/31/2011  Documentation initiated by:  GRAVES-BIGELOW,Ariez Neilan  Subjective/Objective Assessment:   Pt admitted with cp. Currently lives at a residentail treatment center in [place] for substance abuse to xanax. Plan is for d/c on Thursday.     Action/Plan:   Pt will need an effient 30 day free card with a rx for 30 day free no refills and please fill out lilly forms for patient assistance. CM will f/u before Thursday.   Anticipated DC Date:  06/03/2011   Anticipated DC Plan:  HOME/SELF CARE  In-house referral  Clinical Social Worker  Artist      DC Planning Services  CM consult  Medication Assistance      Choice offered to / List presented to:             Status of service:  In process, will continue to follow Medicare Important Message given?   (If response is "NO", the following Medicare IM given date fields will be blank) Date Medicare IM given:   Date Additional Medicare IM given:    Discharge Disposition:    Per UR Regulation:    Comments:  06-02-11 [address] Tomi Bamberger, RN,BSN [phone] CM spoke to pt and he lives at the [facility] [place]. CM spoke to [employer] at the facility and they dispense medications. CM will provide pt with a 30 day supply card of effient and MD please write rx for 30 day free no refills and then the original rx with refills to be sent to [facility] with patient assist application. MD please fill out patient assist forms in the shadow chart in d/c section. RN please give pt patient assist forms at d/c. CM stated to Okey Regal that if pt has not been approved for effient before te 30 day free is completed, to call the office to check for samples. No other needs assessed at this time.

## 2011-06-02 NOTE — Discharge Instructions (Signed)
Myocardial Infarction A myocardial infarction (MI) is damage to the heart that is not reversible. It is also called a heart attack. An MI usually occurs when a heart (coronary) artery becomes blocked or narrowed. This cuts off the blood supply to the heart. When one or more of the heart (coronary) arteries becomes blocked, that area of the heart begins to die. This causes pain felt during an MI.  If you think you might be having an MI, call your local emergency services immediately (911 in [place] It is recommended that you take a 162 mg non-enteric coated aspirin if you do not have an aspirin allergy. Do not drive yourself to the hospital or wait to see if your symptoms go away. The sooner MI is treated, the greater the amount of heart muscle saved. Time is muscle. It can save your life. CAUSES  An MI can occur from:  A gradual buildup of a fatty substance called plaque. When plaque builds up in the arteries, this condition is called atherosclerosis. This buildup can block or reduce the blood supply to the heart artery(s).   A sudden plaque rupture within a heart artery that causes a blood clot (thrombus). A blood clot can block the heart artery which does not allow blood flow to the heart.   A severe tightening (spasm) of the heart artery. This is a less common cause of a heart attack. When a heart artery spasms, it cuts off blood flow through the artery. Spasms can occur in heart arteries that do not have atherosclerosis.  RISK FACTORS People at risk for an MI usually have one or more risk factors, such as:  High blood pressure.   High cholesterol.   Smoking.   Gender. Men have a higher heart attack risk.   Overweight/obesity.   Age.   Family history.   Lack of exercise.   Diabetes.   Stress.   Excessive alcohol use.   Street drug use (cocaine and methamphetamines).  SYMPTOMS  MI symptoms can vary, such as:  In both men and women, MI symptoms can include the following:    Chest pain. The chest pain may feel like a crushing, squeezing, or "pressure" type feeling. MI pain can be "referred," meaning pain can be caused in one part of the body but felt in another part of the body. Referred MI pain may occur in the left arm, neck, or jaw. Pain may even be felt in the right arm.   Shortness of breath (dyspnea).   Heartburn or indigestion with or without vomiting, shortness of breath, or sweating (diaphoresis).   Sudden, cold sweats.   Sudden lightheadedness.   Upper back pain.   Women can have unique MI symptoms, such as:   Unexplained feelings of nervousness or anxiety.   Discomfort between the shoulder blades (scapula) or upper back.   Tingling in the hands and arms.   In elderly people (regardless of gender), MI symptoms can be subtle, such as:   Sweating (diaphoresis).   Shortness of breath (dyspnea).   General tiredness (fatigue) or not feeling well (malaise).  DIAGNOSIS  Diagnosis of an MI involves several tests such as:  An assessment of your vital signs such as heart rhythm, blood pressure, respiratory rate, and oxygen level.   An EKG (ECG) to look at the electrical activity of your heart.   Blood tests called cardiac markers are drawn at scheduled times to measure proteins or enzymes released by the damaged heart muscle.   A chest   X-ray.   An echocardiogram to evaluate heart motion and blood flow.   Coronary angiography (cardiac catheterization). This is a diagnostic procedure to look at the heart arteries.  TREATMENT  Acute Intervention. For an MI, the national standard in the [place] is to have an acute intervention in under 90 minutes from the time you get to the hospital. An acute intervention is a special procedure to open up the heart arteries. It is done in a treatment room called a "catheterization lab" (cath lab). Some hospitals do no have a cath lab. If you are having an MI and the hospital does not have a cath lab, the  standard is to transport you to a hospital that has one. In the cath lab, acute intervention includes:  Angioplasty. An angioplasty involves inserting a thin, flexible tube (catheter) into an artery in either your groin or wrist. The catheter is threaded to the heart arteries. A balloon at the end of the catheter is inflated to open a narrowed or blocked heart artery. During an angioplasty procedure, a small mesh tube (stent) may be used to keep the heart artery open. Depending on your condition and health history, one of two types of stents may be placed:   Drug-eluting stent (DES). A DES is coated with a medicine to prevent scar tissue from growing over the stent. With drug-eluting stents, blood thinning medicine will need to be taken for up to a year.   Bare metal stent. This type of stent has no special coating to keep tissue from growing over it. This type of stent is used if you cannot take blood thinning medicine for a prolonged time or you need surgery in the near future. After a bare metal stent is placed, blood thinning medicine will need to be taken for about a month.   If you are taking blood thinning medicine (anti-platelet therapy) after stent placement, do not stop taking it unless your caregiver says it is okay to do so. Make sure you understand how long you need to take the medicine.  Surgical Intervention  If an acute intervention is not successful, surgery may be needed:   Open heart surgery (coronary artery bypass graft, CABG). CABG takes a vein (saphenous vein) from your leg. The vein is then attached to the blocked heart artery which bypasses the blockage. This then allows blood flow to the heart muscle.  Additional Interventions  A "clot buster" medicine (thrombolytic) may be given. This medicine can help break up a clot in the heart artery. This medicine may be given if a person cannot get to a cath lab right away.   Intra-aortic balloon pump (IABP). If you have suffered a  very severe MI and are too unstable to go to the cath lab or to surgery, an IABP may be used. This is a temporary mechanical device used to increase blood flow to the heart and reduce the workload of the heart until you are stable enough to go to the cath lab or surgery.  HOME CARE INSTRUCTIONS After an MI, you may need the following:  Medication. Take medication as directed by your caregiver. Medications after an MI may:   Keep your blood from clotting easily (blood thinners).   Control your blood pressure.   Help lower your cholesterol.   Control abnormal heart rhythms.   Lifestyle changes. Under the guidance of your caregiver, lifestyle changes include:   Quitting smoking, if you smoke. Your caregiver can help you quit.   Being   physically active.   Maintaining a healthy weight.   Eating a heart healthy diet. A dietician can help you learn healthy eating options.   Managing diabetes.   Reducing stress.   Limiting alcohol intake.  SEEK IMMEDIATE MEDICAL CARE IF:   You have severe chest pain, especially if the pain is crushing or pressure-like and spreads to the arms, back, neck, or jaw. This is an emergency. Do not wait to see if the pain will go away. Get medical help at once. Call your local emergency services (911 in the [place] Do not drive yourself to the hospital.   You have shortness of breath during rest, sleep, or with activity.   You have sudden sweating or clammy skin.   You feel sick to your stomach (nauseous) and throw up (vomit).   You suddenly become lightheaded or dizzy.   You feel your heart beating rapidly or you notice "skipped" beats.  MAKE SURE YOU:   Understand these instructions.   Will watch your condition.   Will get help right away if you are not doing well or get worse.  Document Released: 04/05/2005 Document Revised: 12/16/2010 Document Reviewed: 09/02/2010 [employer] Patient Information 2012 [employer]. 

## 2011-06-02 NOTE — Progress Notes (Signed)
CARDIAC REHAB PHASE I   PRE:  Rate/Rhythm: 82 SR    BP: sitting 122/75    SaO2:   MODE:  Ambulation: 350 ft   POST:  Rate/Rhythm: 100 ST    BP: sitting 121/75     SaO2:   Tolerated well. Pt very interested in education. Ready to make diet and ex change, not ready to quit smoking. Sts he will process that in the future. Requests his name be sent to [facility]. [phone]  Harriet Masson CES, ACSM

## 2011-06-02 NOTE — Progress Notes (Signed)
Subjective:  No chest pain this am.  Objective:  Vital Signs in the last 24 hours: Temp:  [96.6 F (35.9 C)-98 F (36.7 C)] 97.4 F (36.3 C) (02/13 0739) Pulse Rate:  [44-88] 64  (02/13 0800) Resp:  [13-22] 16  (02/13 0739) BP: (91-118)/(51-83) 107/66 mmHg (02/13 0739) SpO2:  [93 %-99 %] 94 % (02/13 0800)  Intake/Output from previous day:  Intake/Output Summary (Last 24 hours) at 06/02/11 0838 Last data filed at 06/02/11 0400  Gross per 24 hour  Intake  648.3 ml  Output   3850 ml  Net -3201.7 ml    Physical Exam: General appearance: alert, cooperative and no distress Lungs: clear to auscultation bilaterally Heart: regular rate and rhythm Rt groin echymotic, no hematoma   Rate: 64  Rhythm: normal sinus rhythm  Lab Results:  Basename 06/02/11 0535 06/01/11 0530  WBC 11.1* 10.8*  HGB 15.3 14.2  PLT 194 220    Basename 06/02/11 0535 06/01/11 0530  NA 136 139  K 4.3 3.8  CL 104 105  CO2 20 25  GLUCOSE 95 99  BUN 13 12  CREATININE 1.02 1.13    Basename 05/30/11 1447 05/30/11 0840  TROPONINI >25.00* >25.00*   Hepatic Function Panel No results found for this basename: PROT,ALBUMIN,AST,ALT,ALKPHOS,BILITOT,BILIDIR,IBILI in the last 72 hours  Basename 06/01/11 0530  CHOL 170    Basename 06/01/11 0530  INR 0.96    Imaging: Imaging results have been reviewed  Cardiac Studies:  Assessment/Plan:   Principal Problem:  *STEMI, RCA DES 05/28/10  Active Problems:  History of ETOH abuse and Xanax abuse. In rehab pta  CAD, residual CFX LAD disease, s/p LAD DES 06/01/11  Tobacco abuse   Plan-Pt is self pay, will review meds with MD. He will [facility] in [place]. He is not on a beta blocker because of sinus bradycardia earlier this adm but this appears to have resolved. Will start low dose beta blocker. Consider adding ACE-I.   Corine Shelter PA-C 06/02/2011, 8:38 AM Agree with note written by Corine Shelter PAC  No CP/SOB. S/P inf  STEMI treated with PCI and Stent 2/9 with staged LAD PCI and stent yesterday (DES). Exam benign. Groin ecchymotic. OK to D/C home on ASA, effient (need to make sure he can get free drug, cost will be an issue), low dose BB and statin. Can add ace-I as an OP BP allowing. ROV with Dr. Dwaine Deter 1-2 weeks.   Runell Gess 06/02/2011 11:02 AM

## 2011-06-02 NOTE — Discharge Summary (Signed)
Patient ID: Larry French,  MRN: [id number], DOB/AGE: 50-Oct-1962 50 y.o.  Admit date: 05/29/2011 Discharge date: 06/02/2011  Primary Care Provider:  Primary Cardiologist: Dr Herbie Baltimore  Discharge Diagnoses Principal Problem:  *STEMI, RCA DES 05/28/10 Active Problems:  History of ETOH abuse and Xanax abuse. In rehab pta  CAD, residual CFX LAD disease  Tobacco abuse    Procedures: Urgent RCA DES 05/29/11                        Elective LAD DES 06/01/11   Hospital Course The patient has no cardiac history. He does have a history of long-standing tobacco abuse and Xanax and ETOH abuse. He is a resident of the [facility] [place].On the day of admission he was dancing to some music. He developed substernal chest discomfort. Described as  10 out of 10. It radiated down his right arm and up into his jaw. It was constant. He was nauseated with dry heaves. He was diaphoretic. He was somewhat short of breath. HE never had this kind of discomfort before. EMS was called and he was found to have acute inferior ST elevation. He was admitted as a STEMI and taken urgently to the cath lab by Dr Herbie Baltimore. He underwent elective RCA DES. He tolorated this well. Pk CK/MB 853/152mb. He did have residual CAD in the LAD and CFX and was taken back to the lab 06/01/11. He received a DES to the LAD and the plan is for medical Rx of CFX disease. We feel he can be discharged 06/02/11. He is to return to the [facility] center. 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 We'll see him in follow up. He'll need his lipids checked as an out patient. We arranged for [employer] assistance as he is self pay.   Discharge Vitals:  Blood pressure 126/80, pulse 76, temperature 96.6 F (35.9 C), temperature source Oral, resp. rate 18, height 5\' 9"  (1.753 m), weight 98.1 kg (216 lb 4.3 oz), SpO2 96.00%.    Labs: Results for orders placed during the hospital encounter of 05/29/11  (from the past 48 hour(s))  BASIC METABOLIC PANEL     Status: Abnormal   Collection Time   06/01/11  5:30 AM      Component Value Range Comment   Sodium 139  135 - 145 (mEq/L)    Potassium 3.8  3.5 - 5.1 (mEq/L)    Chloride 105  96 - 112 (mEq/L)    CO2 25  19 - 32 (mEq/L)    Glucose, Bld 99  70 - 99 (mg/dL)    BUN 12  6 - 23 (mg/dL)    Creatinine, Ser 9.14  0.50 - 1.35 (mg/dL)    Calcium 9.3  8.4 - 10.5 (mg/dL)    GFR calc non Af Amer 75 (*) >90 (mL/min)    GFR calc Af Amer 87 (*) >90 (mL/min)   CBC     Status: Abnormal   Collection Time   06/01/11  5:30 AM      Component Value Range Comment   WBC 10.8 (*) 4.0 - 10.5 (K/uL)    RBC 4.62  4.22 - 5.81 (MIL/uL)    Hemoglobin 14.2  13.0 - 17.0 (g/dL)    HCT 78.2  95.6 - 21.3 (%)    MCV 90.9  78.0 - 100.0 (fL)    MCH 30.7  26.0 - 34.0 (pg)  MCHC 33.8  30.0 - 36.0 (g/dL)    RDW 16.1  09.6 - 04.5 (%)    Platelets 220  150 - 400 (K/uL)   PROTIME-INR     Status: Normal   Collection Time   06/01/11  5:30 AM      Component Value Range Comment   Prothrombin Time 13.0  11.6 - 15.2 (seconds)    INR 0.96  0.00 - 1.49    LIPID PANEL     Status: Abnormal   Collection Time   06/01/11  5:30 AM      Component Value Range Comment   Cholesterol 170  0 - 200 (mg/dL)    Triglycerides 409 (*) <150 (mg/dL)    HDL 28 (*) >81 (mg/dL)    Total CHOL/HDL Ratio 6.1      VLDL 44 (*) 0 - 40 (mg/dL)    LDL Cholesterol 98  0 - 99 (mg/dL)   POCT ACTIVATED CLOTTING TIME     Status: Normal   Collection Time   06/01/11  2:54 PM      Component Value Range Comment   Activated Clotting Time 408     CBC     Status: Abnormal   Collection Time   06/02/11  5:35 AM      Component Value Range Comment   WBC 11.1 (*) 4.0 - 10.5 (K/uL)    RBC 4.96  4.22 - 5.81 (MIL/uL)    Hemoglobin 15.3  13.0 - 17.0 (g/dL)    HCT 19.1  47.8 - 29.5 (%)    MCV 91.3  78.0 - 100.0 (fL)    MCH 30.8  26.0 - 34.0 (pg)    MCHC 33.8  30.0 - 36.0 (g/dL)    RDW 62.1  30.8 - 65.7 (%)     Platelets 194  150 - 400 (K/uL)   BASIC METABOLIC PANEL     Status: Abnormal   Collection Time   06/02/11  5:35 AM      Component Value Range Comment   Sodium 136  135 - 145 (mEq/L)    Potassium 4.3  3.5 - 5.1 (mEq/L) HEMOLYSIS AT THIS LEVEL MAY AFFECT RESULT   Chloride 104  96 - 112 (mEq/L)    CO2 20  19 - 32 (mEq/L)    Glucose, Bld 95  70 - 99 (mg/dL)    BUN 13  6 - 23 (mg/dL)    Creatinine, Ser 8.46  0.50 - 1.35 (mg/dL)    Calcium 9.7  8.4 - 10.5 (mg/dL)    GFR calc non Af Amer 85 (*) >90 (mL/min)    GFR calc Af Amer >90  >90 (mL/min)     Disposition:  Follow-up Information    Follow up with HARDING,DAVID W, MD. (office will call)    Contact information:   [facility] [address], [address] [place] [address] [phone]          Discharge Medications:  Medication List  As of 06/02/2011  3:18 PM   TAKE these medications         aspirin 81 MG chewable tablet   Chew 1 tablet (81 mg total) by mouth daily.      metoprolol tartrate 12.5 mg Tabs   Commonly known as: LOPRESSOR   Take 0.5 tablets (12.5 mg total) by mouth 2 (two) times daily.      nitroGLYCERIN 0.4 MG SL tablet   Commonly known as: NITROSTAT   Place 1 tablet (0.4 mg  total) under the tongue every 5 (five) minutes as needed for chest pain.      PARoxetine 10 MG tablet   Commonly known as: PAXIL   Take 10 mg by mouth every morning.      prasugrel 10 MG Tabs   Commonly known as: EFFIENT   Take 1 tablet (10 mg total) by mouth daily.      QUEtiapine 100 MG tablet   Commonly known as: SEROQUEL   Take 100 mg by mouth at bedtime.      simvastatin 40 MG tablet   Commonly known as: ZOCOR   Take 1 tablet (40 mg total) by mouth every evening.      trazodone 300 MG tablet   Commonly known as: DESYREL   Take 300 mg by mouth at bedtime.      VIIBRYD 40 MG Tabs   Generic drug: Vilazodone HCl   Take 40 mg by mouth daily.            Outstanding  Labs/Studies  Duration of Discharge Encounter: Greater than 30 minutes including physician time.  Jolene Provost PA-C 06/02/2011 3:18 PM

## 2012-09-22 ENCOUNTER — Other Ambulatory Visit: Payer: Self-pay

## 2012-09-22 MED ORDER — METOPROLOL TARTRATE 12.5 MG HALF TABLET: 12.5000 mg | ORAL_TABLET | Freq: Two times a day (BID) | ORAL | Status: DC

## 2012-09-22 NOTE — Telephone Encounter (Signed)
Rx was sent to pharmacy electronically. 

## 2012-09-22 NOTE — Telephone Encounter (Addendum)
Rx was called in to pharmacy. 

## 2012-09-22 NOTE — Addendum Note (Signed)
Addended by: Neta Ehlers on: 09/22/2012 04:06 PM   Modules accepted: Orders

## 2012-09-22 NOTE — Addendum Note (Signed)
Addended by: Neta Ehlers on: 09/22/2012 04:12 PM   Modules accepted: Orders

## 2012-10-10 ENCOUNTER — Other Ambulatory Visit: Payer: Self-pay | Admitting: *Deleted

## 2012-10-10 MED ORDER — SIMVASTATIN 40 MG PO TABS: 40.0000 mg | ORAL_TABLET | Freq: Every evening | ORAL | Status: DC

## 2012-10-10 NOTE — Telephone Encounter (Signed)
Simvastatin refilled x1 electronically

## 2012-11-22 ENCOUNTER — Other Ambulatory Visit: Payer: Self-pay

## 2012-11-22 MED ORDER — SIMVASTATIN 40 MG PO TABS: 40.0000 mg | ORAL_TABLET | Freq: Every evening | ORAL | Status: DC

## 2012-11-22 NOTE — Telephone Encounter (Signed)
Rx was called in to pharmacy. 

## 2012-12-05 ENCOUNTER — Other Ambulatory Visit: Payer: Self-pay | Admitting: *Deleted

## 2013-01-03 ENCOUNTER — Other Ambulatory Visit: Payer: Self-pay | Admitting: *Deleted

## 2013-01-03 MED ORDER — SIMVASTATIN 40 MG PO TABS: 40.0000 mg | ORAL_TABLET | Freq: Every evening | ORAL | Status: DC

## 2013-01-03 NOTE — Telephone Encounter (Signed)
Rx was sent to pharmacy electronically. 

## 2013-11-24 ENCOUNTER — Emergency Department: Payer: Self-pay | Admitting: Emergency Medicine

## 2013-11-24 LAB — URINALYSIS, COMPLETE
BACTERIA: NONE SEEN
BILIRUBIN, UR: NEGATIVE
BLOOD: NEGATIVE
GLUCOSE, UR: NEGATIVE mg/dL (ref 0–75)
KETONE: NEGATIVE
LEUKOCYTE ESTERASE: NEGATIVE
Nitrite: NEGATIVE
PROTEIN: NEGATIVE
Ph: 6 (ref 4.5–8.0)
RBC, UR: NONE SEEN /HPF (ref 0–5)
Specific Gravity: 1.008 (ref 1.003–1.030)
Squamous Epithelial: NONE SEEN
WBC UR: 2 /HPF (ref 0–5)

## 2013-11-24 LAB — COMPREHENSIVE METABOLIC PANEL
ALT: 31 U/L
ANION GAP: 11 (ref 7–16)
AST: 54 U/L — AB (ref 15–37)
Albumin: 3.7 g/dL (ref 3.4–5.0)
Alkaline Phosphatase: 82 U/L
BUN: 7 mg/dL (ref 7–18)
Bilirubin,Total: 0.4 mg/dL (ref 0.2–1.0)
Calcium, Total: 8 mg/dL — ABNORMAL LOW (ref 8.5–10.1)
Chloride: 107 mmol/L (ref 98–107)
Co2: 25 mmol/L (ref 21–32)
Creatinine: 1.04 mg/dL (ref 0.60–1.30)
EGFR (African American): >60
EGFR (Non-African Amer.): >60
GLUCOSE: 104 mg/dL — AB (ref 65–99)
OSMOLALITY: 283 (ref 275–301)
POTASSIUM: 3.5 mmol/L (ref 3.5–5.1)
SODIUM: 143 mmol/L (ref 136–145)
Total Protein: 7 g/dL (ref 6.4–8.2)

## 2013-11-24 LAB — DRUG SCREEN, URINE
AMPHETAMINES, UR SCREEN: NEGATIVE (ref ?–1000)
Barbiturates, Ur Screen: NEGATIVE (ref ?–200)
Benzodiazepine, Ur Scrn: POSITIVE (ref ?–200)
Cannabinoid 50 Ng, Ur ~~LOC~~: NEGATIVE (ref ?–50)
Cocaine Metabolite,Ur ~~LOC~~: NEGATIVE (ref ?–300)
MDMA (Ecstasy)Ur Screen: POSITIVE (ref ?–500)
Methadone, Ur Screen: NEGATIVE (ref ?–300)
OPIATE, UR SCREEN: NEGATIVE (ref ?–300)
PHENCYCLIDINE (PCP) UR S: NEGATIVE (ref ?–25)
TRICYCLIC, UR SCREEN: POSITIVE (ref ?–1000)

## 2013-11-24 LAB — ACETAMINOPHEN LEVEL

## 2013-11-24 LAB — CBC
HCT: 45.9 % (ref 40.0–52.0)
HGB: 15.2 g/dL (ref 13.0–18.0)
MCH: 32.1 pg (ref 26.0–34.0)
MCHC: 33.2 g/dL (ref 32.0–36.0)
MCV: 97 fL (ref 80–100)
PLATELETS: 232 10*3/uL (ref 150–440)
RBC: 4.75 10*6/uL (ref 4.40–5.90)
RDW: 14.6 % — ABNORMAL HIGH (ref 11.5–14.5)
WBC: 6.9 10*3/uL (ref 3.8–10.6)

## 2013-11-24 LAB — ETHANOL
ETHANOL %: 0.21 % — AB (ref 0.000–0.080)
Ethanol: 210 mg/dL

## 2013-11-24 LAB — SALICYLATE LEVEL: SALICYLATES, SERUM: 4.4 mg/dL — AB

## 2013-11-24 LAB — TSH: Thyroid Stimulating Horm: 0.41 u[IU]/mL — ABNORMAL LOW

## 2013-12-01 ENCOUNTER — Emergency Department: Payer: Self-pay | Admitting: Student

## 2014-03-04 ENCOUNTER — Emergency Department: Payer: Self-pay | Admitting: Emergency Medicine

## 2014-03-04 LAB — DRUG SCREEN, URINE
AMPHETAMINES, UR SCREEN: NEGATIVE (ref ?–1000)
BENZODIAZEPINE, UR SCRN: POSITIVE (ref ?–200)
Barbiturates, Ur Screen: NEGATIVE (ref ?–200)
COCAINE METABOLITE, UR ~~LOC~~: NEGATIVE (ref ?–300)
Cannabinoid 50 Ng, Ur ~~LOC~~: NEGATIVE (ref ?–50)
MDMA (Ecstasy)Ur Screen: NEGATIVE (ref ?–500)
METHADONE, UR SCREEN: NEGATIVE (ref ?–300)
OPIATE, UR SCREEN: NEGATIVE (ref ?–300)
Phencyclidine (PCP) Ur S: NEGATIVE (ref ?–25)
Tricyclic, Ur Screen: NEGATIVE (ref ?–1000)

## 2014-03-04 LAB — URINALYSIS, COMPLETE
BILIRUBIN, UR: NEGATIVE
Glucose,UR: NEGATIVE mg/dL (ref 0–75)
Ketone: NEGATIVE
Leukocyte Esterase: NEGATIVE
Nitrite: NEGATIVE
Ph: 5 (ref 4.5–8.0)
Protein: NEGATIVE
RBC,UR: <1 /HPF (ref 0–5)
SPECIFIC GRAVITY: 1.005 (ref 1.003–1.030)
Squamous Epithelial: NONE SEEN
WBC UR: 1 /HPF (ref 0–5)

## 2014-03-04 LAB — CBC WITH DIFFERENTIAL/PLATELET
BASOS ABS: 0.1 10*3/uL (ref 0.0–0.1)
BASOS PCT: 0.7 %
Eosinophil #: 0 10*3/uL (ref 0.0–0.7)
Eosinophil %: 0.1 %
HCT: 54.2 % — AB (ref 40.0–52.0)
HGB: 18.3 g/dL — AB (ref 13.0–18.0)
LYMPHS PCT: 24.9 %
Lymphocyte #: 3.1 10*3/uL (ref 1.0–3.6)
MCH: 32.8 pg (ref 26.0–34.0)
MCHC: 33.7 g/dL (ref 32.0–36.0)
MCV: 98 fL (ref 80–100)
MONO ABS: 1 x10 3/mm (ref 0.2–1.0)
Monocyte %: 7.9 %
NEUTROS ABS: 8.3 10*3/uL — AB (ref 1.4–6.5)
Neutrophil %: 66.4 %
Platelet: 311 10*3/uL (ref 150–440)
RBC: 5.56 10*6/uL (ref 4.40–5.90)
RDW: 14 % (ref 11.5–14.5)
WBC: 12.5 10*3/uL — ABNORMAL HIGH (ref 3.8–10.6)

## 2014-03-04 LAB — COMPREHENSIVE METABOLIC PANEL
ALBUMIN: 4.3 g/dL (ref 3.4–5.0)
ALT: 79 U/L — AB
Alkaline Phosphatase: 120 U/L — ABNORMAL HIGH
Anion Gap: 13 (ref 7–16)
BUN: 13 mg/dL (ref 7–18)
Bilirubin,Total: 0.5 mg/dL (ref 0.2–1.0)
CALCIUM: 7.9 mg/dL — AB (ref 8.5–10.1)
CREATININE: 1.18 mg/dL (ref 0.60–1.30)
Chloride: 109 mmol/L — ABNORMAL HIGH (ref 98–107)
Co2: 22 mmol/L (ref 21–32)
Glucose: 133 mg/dL — ABNORMAL HIGH (ref 65–99)
Potassium: 3.9 mmol/L (ref 3.5–5.1)
SGOT(AST): 95 U/L — ABNORMAL HIGH (ref 15–37)
Sodium: 144 mmol/L (ref 136–145)
Total Protein: 7.8 g/dL (ref 6.4–8.2)

## 2014-03-04 LAB — PROTIME-INR
INR: 1.1
Prothrombin Time: 14.3 secs (ref 11.5–14.7)

## 2014-03-04 LAB — ETHANOL: ETHANOL LVL: 341 mg/dL — AB

## 2014-03-04 LAB — MAGNESIUM: Magnesium: 2.3 mg/dL

## 2014-03-04 LAB — SALICYLATE LEVEL: Salicylates, Serum: 3.9 mg/dL — ABNORMAL HIGH

## 2014-03-04 LAB — APTT: Activated PTT: 31.3 secs (ref 23.6–35.9)

## 2014-03-04 LAB — ACETAMINOPHEN LEVEL: Acetaminophen: <2 ug/mL

## 2014-03-04 LAB — LIPASE, BLOOD: Lipase: 77 U/L (ref 73–393)

## 2014-03-05 ENCOUNTER — Emergency Department: Payer: Self-pay | Admitting: Emergency Medicine

## 2014-03-05 LAB — URINALYSIS, COMPLETE
BLOOD: NEGATIVE
Bacteria: NONE SEEN
Bilirubin,UR: NEGATIVE
Glucose,UR: NEGATIVE mg/dL (ref 0–75)
Ketone: NEGATIVE
Leukocyte Esterase: NEGATIVE
Nitrite: NEGATIVE
Ph: 6 (ref 4.5–8.0)
Protein: NEGATIVE
RBC,UR: NONE SEEN /HPF (ref 0–5)
Specific Gravity: 1.005 (ref 1.003–1.030)
Squamous Epithelial: NONE SEEN
WBC UR: <1 /HPF (ref 0–5)

## 2014-03-05 LAB — COMPREHENSIVE METABOLIC PANEL
ALBUMIN: 3.5 g/dL (ref 3.4–5.0)
AST: 139 U/L — AB (ref 15–37)
Alkaline Phosphatase: 101 U/L
Anion Gap: 8 (ref 7–16)
BUN: 12 mg/dL (ref 7–18)
Bilirubin,Total: 0.6 mg/dL (ref 0.2–1.0)
Calcium, Total: 7.6 mg/dL — ABNORMAL LOW (ref 8.5–10.1)
Chloride: 108 mmol/L — ABNORMAL HIGH (ref 98–107)
Co2: 22 mmol/L (ref 21–32)
Creatinine: 0.83 mg/dL (ref 0.60–1.30)
Glucose: 100 mg/dL — ABNORMAL HIGH (ref 65–99)
Osmolality: 276 (ref 275–301)
Potassium: 4.2 mmol/L (ref 3.5–5.1)
SGPT (ALT): 88 U/L — ABNORMAL HIGH
Sodium: 138 mmol/L (ref 136–145)
TOTAL PROTEIN: 6 g/dL — AB (ref 6.4–8.2)

## 2014-03-05 LAB — DRUG SCREEN, URINE

## 2014-03-05 LAB — CBC
HCT: 45.5 % (ref 40.0–52.0)
HGB: 15.1 g/dL (ref 13.0–18.0)
MCH: 33 pg (ref 26.0–34.0)
MCHC: 33.1 g/dL (ref 32.0–36.0)
MCV: 100 fL (ref 80–100)
PLATELETS: 167 10*3/uL (ref 150–440)
RBC: 4.58 10*6/uL (ref 4.40–5.90)
RDW: 13.8 % (ref 11.5–14.5)
WBC: 7.9 10*3/uL (ref 3.8–10.6)

## 2014-03-05 LAB — ACETAMINOPHEN LEVEL: ACETAMINOPHEN: 11 ug/mL

## 2014-03-05 LAB — TROPONIN I
Troponin-I: <0.02 ng/mL
Troponin-I: <0.02 ng/mL

## 2014-03-05 LAB — SALICYLATE LEVEL: SALICYLATES, SERUM: 12.5 mg/dL — AB

## 2014-03-05 LAB — ETHANOL: Ethanol: 8 mg/dL

## 2014-03-07 ENCOUNTER — Emergency Department: Payer: Self-pay | Admitting: Emergency Medicine

## 2014-03-07 LAB — ETHANOL: Ethanol: 323 mg/dL

## 2014-03-07 LAB — DRUG SCREEN, URINE
Amphetamines, Ur Screen: NEGATIVE (ref ?–1000)
BENZODIAZEPINE, UR SCRN: POSITIVE (ref ?–200)
Barbiturates, Ur Screen: NEGATIVE (ref ?–200)
COCAINE METABOLITE, UR ~~LOC~~: NEGATIVE (ref ?–300)
Cannabinoid 50 Ng, Ur ~~LOC~~: NEGATIVE (ref ?–50)
MDMA (Ecstasy)Ur Screen: NEGATIVE (ref ?–500)
METHADONE, UR SCREEN: NEGATIVE (ref ?–300)
OPIATE, UR SCREEN: NEGATIVE (ref ?–300)
Phencyclidine (PCP) Ur S: NEGATIVE (ref ?–25)
Tricyclic, Ur Screen: POSITIVE (ref ?–1000)

## 2014-03-07 LAB — CBC
HCT: 46.7 % (ref 40.0–52.0)
HGB: 15.6 g/dL (ref 13.0–18.0)
MCH: 32.9 pg (ref 26.0–34.0)
MCHC: 33.4 g/dL (ref 32.0–36.0)
MCV: 99 fL (ref 80–100)
Platelet: 148 10*3/uL — ABNORMAL LOW (ref 150–440)
RBC: 4.74 10*6/uL (ref 4.40–5.90)
RDW: 14.1 % (ref 11.5–14.5)
WBC: 5.6 10*3/uL (ref 3.8–10.6)

## 2014-03-07 LAB — COMPREHENSIVE METABOLIC PANEL
ALBUMIN: 3.4 g/dL (ref 3.4–5.0)
AST: 129 U/L — AB (ref 15–37)
Alkaline Phosphatase: 152 U/L — ABNORMAL HIGH
Anion Gap: 7 (ref 7–16)
BUN: 9 mg/dL (ref 7–18)
Bilirubin,Total: 0.3 mg/dL (ref 0.2–1.0)
CHLORIDE: 109 mmol/L — AB (ref 98–107)
Calcium, Total: 7.4 mg/dL — ABNORMAL LOW (ref 8.5–10.1)
Co2: 27 mmol/L (ref 21–32)
Creatinine: 1.21 mg/dL (ref 0.60–1.30)
EGFR (African American): >60
EGFR (Non-African Amer.): >60
GLUCOSE: 123 mg/dL — AB (ref 65–99)
Osmolality: 285 (ref 275–301)
Potassium: 3.2 mmol/L — ABNORMAL LOW (ref 3.5–5.1)
SGPT (ALT): 104 U/L — ABNORMAL HIGH
SODIUM: 143 mmol/L (ref 136–145)
TOTAL PROTEIN: 6.4 g/dL (ref 6.4–8.2)

## 2014-03-07 LAB — ACETAMINOPHEN LEVEL: Acetaminophen: 4 ug/mL — ABNORMAL LOW

## 2014-03-07 LAB — SALICYLATE LEVEL: SALICYLATES, SERUM: 2.8 mg/dL

## 2014-03-28 ENCOUNTER — Encounter (HOSPITAL_COMMUNITY): Payer: Self-pay | Admitting: Cardiology

## 2014-08-10 NOTE — Consult Note (Signed)
PATIENT NAME:  Larry French, Larry French MR#:  [id number] DATE OF BIRTH:  [birth year]  DATE OF CONSULTATION:  03/07/2014  REFERRING PHYSICIAN:   CONSULTING PHYSICIAN:  Gonzella Lex, MD  IDENTIFYING INFORMATION AND REASON FOR CONSULTATION: A 54 year old man with a history of alcohol abuse.   CHIEF COMPLAINT: "I'm drinking a pint a day."   HISTORY OF PRESENT ILLNESS: Information obtained from the patient and the chart. The patient came into the Emergency Room stating that he was drinking and initially stating that he wanted detoxification. He tells me that he has been drinking a pint a day for the last several days. He tells me that he just did that because his brother had been drinking and for some reason that was a justification to the patient drinking. He claims that prior to that, he had been sober for 6 years. He does not report any mood symptoms. Says he has chronic anxiety symptoms, but that when he takes his medicine he feels fine. Denies any suicidal or homicidal ideation. He denies that he is abusing other drugs. The patient has been in the Emergency Room 3 times in the last 4 days with similar complaints but has not been admitted to the hospital. At this point, he is now stating that he is feeling okay, and that he feels that going to an outpatient treatment program would be safe and reasonable for him.   PAST PSYCHIATRIC HISTORY: Long history of alcohol abuse. According to the patient, he had been sober for 6 years. That is slightly contradicted by his having been here in August of this year for detoxification, but prior to that it did look like it had been several years since he was last in the hospital for detoxification. He denies any history of suicide attempts, denies any history of violence. He sees Dr. Jacqualine Code for outpatient treatment and is apparently prescribed Seroquel. He claims that he is prescribed Xanax 5 times a day, but we are not able to find any evidence of that.   PAST MEDICAL  HISTORY: History of myocardial infarction. History of high blood pressure, dyslipidemia.   SOCIAL HISTORY: The patient lives with his elderly parents, evidently his brother does too. Never married. No children. Does not hold down a job.   FAMILY HISTORY: Positive for substance abuse.   SUBSTANCE ABUSE HISTORY: Long history of alcohol abuse. It looks like he has had detoxification treatment in the hospital before, back in 2007. The patient denies any history of seizures. He says that there have been times when he had hallucinations related to intoxication.   REVIEW OF SYSTEMS: Not having any specific complaints right now. No suicidal or homicidal ideation. No hallucinations. Not feeling shaky. The rest of the review of systems unremarkable.   MENTAL STATUS EXAMINATION: Disheveled gentleman who looks his stated age, cooperative with the interview. Eye contact good. Psychomotor activity rather expansive and demonstrative. Speech is a little bit loud at times. Affect is upbeat and jolly. Mood is stated as good. Thoughts appear to be a little bit scattered but not bizarre. No obvious delusions. Denies auditory or visual hallucinations. Denies suicidal or homicidal ideation. Judgment and insight somewhat impaired about his ongoing substance abuse. The patient able to remember 3/3 objects immediately, only 1/3 at three minutes. Baseline intelligence presumably normal.   LABORATORY RESULTS: Drug screen positive for tricyclics and benzodiazepines. Salicylates normal. Acetaminophen unremarkable. Alcohol level at 9:30 this morning was 323. Glucose elevated at 123, potassium low at 3.2, low calcium 7.4. Elevated  alkaline phosphatase, ALT and AST. CBC shows a low platelet count at 148,000. Urinalysis unremarkable.   VITAL SIGNS: Blood pressure 120/84, respirations 20, pulse 102, temperature 97.7.   ASSESSMENT: A 54 year old man with a history of substance abuse, particularly alcohol. Intoxicated again. Not  psychotic. States he wants to go to outpatient treatment. Understands the potential risks of that. Denies any suicidal ideation. No evidence of acute suicidality. At this point, the patient no longer needs involuntary commitment. He can be released to outpatient treatment. Continue current medicine as prescribed by his outpatient psychiatrist.   DIAGNOSIS, PRINCIPAL AND PRIMARY:  AXIS I: Alcohol dependence.  AXIS II: Anxiety disorder, not otherwise specified.    ____________________________ Gonzella Lex, MD jtc:TT D: 03/07/2014 17:06:28 ET T: 03/07/2014 17:29:16 ET JOB#: [id number]  cc: Gonzella Lex, MD, <Dictator> Gonzella Lex MD ELECTRONICALLY SIGNED 03/29/2014 19:06

## 2014-08-10 NOTE — Consult Note (Signed)
PATIENT NAME:  Larry French, Larry French MR#:  [id number] DATE OF BIRTH:  11-29-1960  DATE OF CONSULTATION:  11/25/2013  REFERRING PHYSICIAN:    CONSULTING PHYSICIAN:  Vu Liebman K. Franchot Mimes, MD  PLACE OF DICTATION:  [place] [address], [place], [place].  SUBJECTIVE:  Patient was seen in consultation in the [facility] Emergency Room at [facility]. Patient is a 54 year old white male, not employed, and last worked at [employer], and he was let go after 6 months for tardiness and absenteeism.  Patient is single, and never married, and lives with his parents who are 42 year old, and 44 year old, all 3 of them live a house.   Patient comes today to [facility] Emergency Room with a chief complaint "I have been drinking alcohol, and I have been sober for 5 years, and started drinking for the past 2 days because I was stupid."    HISTORY OF PRESENT ILLNESS:  Patient reports that he had been sober for 5 years with the help of [place] meetings, and for no reason he started drinking again at the rate of a case of beer a day for the past 2 days, and he got drunk, and he fell down on his piano, and he has bruises on his forehead, and was brought here for help.  ALCOHOL AND DRUGS: Patient reports that he has been sober for 5 years and has been attending [place] meetings.  Currently he has a sponsor that he does not relate.  Had a DWI in 1990s, and has a probational license to go to work; denies any other street drug or prescription drug abuse; does admit to smoking nicotine cigarettes at the rate of pack a day for many years.   MENTAL STATUS:  Patient is alert and oriented to place, person, and time, calm, pleasant, and cooperative, no agitation; affect is neutral, mood stable. Denies feeling depressed; denies feeling hopeless or helpless. No psychosis; does not appear to be responding to internal stimuli; cognition intact.  Denies suicidal or homicidal plans and contracts for safety, is eager to go home, and go back to [employer] and find a new  sponsor, as he does not relate with the current sponsor.   IMPRESSION: Alcohol abuse with intoxication.   RECOMMEND:  Discontinue IVC, that is involuntary commitment, and discharge patient to go back home to live with his parents, and keep up his followup appointments at [facility] and find a new sponsor, which he will be able to do.     ____________________________ Wallace Cullens. Franchot Mimes, MD skc:nt D: 11/25/2013 17:32:16 ET T: 11/25/2013 17:38:44 ET JOB#: [id number]  cc: Arlyn Leak K. Franchot Mimes, MD, <Dictator> Dewain Penning MD ELECTRONICALLY SIGNED 12/01/2013 17:15

## 2014-09-10 ENCOUNTER — Encounter: Payer: Self-pay | Admitting: Pharmacist

## 2014-09-17 ENCOUNTER — Encounter (INDEPENDENT_AMBULATORY_CARE_PROVIDER_SITE_OTHER): Payer: Self-pay

## 2014-10-11 ENCOUNTER — Encounter: Payer: Self-pay | Admitting: Emergency Medicine

## 2014-10-11 ENCOUNTER — Emergency Department
Admission: EM | Admit: 2014-10-11 | Discharge: 2014-10-11 | Disposition: A | Discharge status: Home / Self Care | Payer: Self-pay | Attending: Emergency Medicine | Admitting: Emergency Medicine

## 2014-10-11 DIAGNOSIS — Z720 Tobacco use: Secondary | ICD-10-CM | POA: Insufficient documentation

## 2014-10-11 DIAGNOSIS — F1510 Other stimulant abuse, uncomplicated: Secondary | ICD-10-CM | POA: Insufficient documentation

## 2014-10-11 DIAGNOSIS — F1310 Sedative, hypnotic or anxiolytic abuse, uncomplicated: Secondary | ICD-10-CM | POA: Insufficient documentation

## 2014-10-11 DIAGNOSIS — F419 Anxiety disorder, unspecified: Secondary | ICD-10-CM | POA: Insufficient documentation

## 2014-10-11 LAB — COMPREHENSIVE METABOLIC PANEL
ALT: 21 U/L (ref 17–63)
AST: 21 U/L (ref 15–41)
Albumin: 4.4 g/dL (ref 3.5–5.0)
Alkaline Phosphatase: 80 U/L (ref 38–126)
Anion gap: 9 (ref 5–15)
BILIRUBIN TOTAL: 0.5 mg/dL (ref 0.3–1.2)
BUN: 12 mg/dL (ref 6–20)
CHLORIDE: 99 mmol/L — AB (ref 101–111)
CO2: 26 mmol/L (ref 22–32)
CREATININE: 1.3 mg/dL — AB (ref 0.61–1.24)
Calcium: 9.6 mg/dL (ref 8.9–10.3)
GLUCOSE: 84 mg/dL (ref 65–99)
Potassium: 5.1 mmol/L (ref 3.5–5.1)
Sodium: 134 mmol/L — ABNORMAL LOW (ref 135–145)
Total Protein: 8 g/dL (ref 6.5–8.1)

## 2014-10-11 LAB — URINE DRUG SCREEN, QUALITATIVE (ARMC ONLY)
AMPHETAMINES, UR SCREEN: NOT DETECTED
Barbiturates, Ur Screen: NOT DETECTED
Benzodiazepine, Ur Scrn: POSITIVE — AB
COCAINE METABOLITE, UR ~~LOC~~: NOT DETECTED
Cannabinoid 50 Ng, Ur ~~LOC~~: NOT DETECTED
MDMA (ECSTASY) UR SCREEN: NOT DETECTED
Methadone Scn, Ur: NOT DETECTED
Opiate, Ur Screen: NOT DETECTED
Phencyclidine (PCP) Ur S: NOT DETECTED
TRICYCLIC, UR SCREEN: POSITIVE — AB

## 2014-10-11 LAB — CBC
HCT: 50.4 % (ref 40.0–52.0)
Hemoglobin: 17.1 g/dL (ref 13.0–18.0)
MCH: 32.8 pg (ref 26.0–34.0)
MCHC: 33.8 g/dL (ref 32.0–36.0)
MCV: 96.8 fL (ref 80.0–100.0)
PLATELETS: 221 10*3/uL (ref 150–440)
RBC: 5.21 MIL/uL (ref 4.40–5.90)
RDW: 14.7 % — ABNORMAL HIGH (ref 11.5–14.5)
WBC: 8.7 10*3/uL (ref 3.8–10.6)

## 2014-10-11 NOTE — ED Notes (Signed)
Pt to ed wanting medical clearance to be able to go to [facility].  Pt states he uses five, 1mg  tablets of xanax everyday.  Pt states they have a bed for him there but needs medical clearance.

## 2014-10-11 NOTE — Discharge Instructions (Signed)
Chemical Dependency Chemical dependency is an addiction to drugs or alcohol. It is characterized by the repeated behavior of seeking out and using drugs and alcohol despite harmful consequences to the health and safety of ones self and others.  RISK FACTORS There are certain situations or behaviors that increase a person's risk for chemical dependency. These include:  A family history of chemical dependency.  A history of mental health issues, including depression and anxiety.  A home environment where drugs and alcohol are easily available to you.  Drug or alcohol use at a young age. SYMPTOMS  The following symptoms can indicate chemical dependency:  Inability to limit the use of drugs or alcohol.  Nausea, sweating, shakiness, and anxiety that occurs when alcohol or drugs are not being used.  An increase in amount of drugs or alcohol that is necessary to get drunk or high. People who experience these symptoms can assess their use of drugs and alcohol by asking themselves the following questions:  Have you been told by friends or family that they are worried about your use of alcohol or drugs?  Do friends and family ever tell you about things you did while drinking alcohol or using drugs that you do not remember?  Do you lie about using alcohol or drugs or about the amounts you use?  Do you have difficulty completing daily tasks unless you use alcohol or drugs?  Is the level of your work or school performance lower because of your drug or alcohol use?  Do you get sick from using drugs or alcohol but keep using anyway?  Do you feel uncomfortable in social situations unless you use alcohol or drugs?  Do you use drugs or alcohol to help forget problems? An answer of yes to any of these questions may indicate chemical dependency. Professional evaluation is suggested. Document Released: 03/30/2001 Document Revised: 06/28/2011 Document Reviewed: 06/11/2010 [facility] Patient  Information 2015 [place], [place]. This information is not intended to replace advice given to you by your health care provider. Make sure you discuss any questions you have with your health care provider.

## 2014-10-11 NOTE — ED Provider Notes (Signed)
[facility] Emergency Department Provider Note     Time seen: ----------------------------------------- 12:19 PM on 10/11/2014 -----------------------------------------    I have reviewed the triage vital signs and the nursing notes.   HISTORY  Chief Complaint Medical Clearance    HPI Larry French is a 54 y.o. male who presents ER for medical clearance to go to [facility]. Patient states he uses 51 mg tablets of Xanax every day, and was told by the [facility] director that they have a bed for him today. Patient denies any complaints, states he wants help because he is using more Xanax phase posterior. States his brother died in a car wreck and his usage has increased for him to cope of same.   Past Medical History  Diagnosis Date  . Alcohol abuse     Recovered x 15 months  . CA - skin cancer   . Mental disorder   . Headache(784.0)   . Anxiety   . Depression   . 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 STEMI, RCA DES 05/28/10 06/01/2011  . 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 Tobacco abuse 05/29/2011  . History of ETOH abuse and Xanax abuse. In rehab pta 05/29/2011    Past Surgical History  Procedure Laterality Date  . Tonsillectomy and adenoidectomy    . Left heart cath Right 05/29/2011    Procedure: LEFT HEART CATH;  Surgeon: Leonie Man, MD;  Location[facility] C[facility] LAB;  Service: Cardiovascular;  Laterality: Right;  . Left heart catheterization with coronary angiogram [name] 06/01/2011    Procedure: LEFT HEART CATHETERIZATION WITH CORONARY ANGIOGRAM;  Surgeon: Leonie Man, MD;  Location: [facility] CATH LAB;  Service: Cardiovascular;  Laterality: [name];  relook cath, possible PCI    Allergies Review of patient's allergies indicates no known allergies.  Social History History  Substance Use Topics  . Smoking status: Current Every Day Smoker -- 1.00 packs/day for 30 years    Types: Cigarettes   . Smokeless tobacco: Not on file  . Alcohol Use: No    Review of Systems Constitutional: Negative for fever. Eyes: Negative for visual changes. ENT: Negative for sore throat. Cardiovascular: Negative for chest pain. Respiratory: Negative for shortness of breath. Gastrointestinal: Negative for abdominal pain, vomiting and diarrhea. Genitourinary: Negative for dysuria. Musculoskeletal: Negative for back pain. Skin: Negative for rash. Neurological: Negative for headaches, focal weakness or numbness. Psychiatric: Positive for anxiety  10-point ROS otherwise negative.  ____________________________________________   PHYSICAL EXAM:  VITAL SIGNS: ED Triage Vitals  Enc Vitals Group     BP 10/11/14 1118 131/78 mmHg     Pulse Rate 10/11/14 1118 66     Resp 10/11/14 1118 18     Temp 10/11/14 1118 97.7 F (36.5 C)     Temp Source 10/11/14 1118 Oral     SpO2 10/11/14 1118 98 %     Weight 10/11/14 1118 195 lb (88.451 kg)     Height 10/11/14 1118 5\' 8"  (1.727 m)     Head Cir --      Peak Flow --      Pain Score 10/11/14 1119 8     Pain Loc --      Pain Edu? --      Excl. in [place]? --     Constitutional: Alert and oriented. Well appearing and in no distress. Eyes: Conjunctivae are normal. PERRL. Normal extraocular movements. ENT  Head: Normocephalic and atraumatic.   Nose: No congestion/rhinnorhea.   Mouth/Throat: Mucous membranes are moist.   Neck: No stridor. Hematological/Lymphatic/Immunilogical: No cervical lymphadenopathy. Cardiovascular: Normal rate, regular rhythm. Normal and symmetric distal pulses are present in all extremities. No murmurs, rubs, or gallops. Respiratory: Normal respiratory effort without tachypnea nor retractions. Breath sounds are clear and equal bilaterally. No wheezes/rales/rhonchi. Gastrointestinal: Soft and nontender. No distention. No abdominal bruits. There is no CVA tenderness. Musculoskeletal: Nontender with normal range of motion  in all extremities. No joint effusions.  No lower extremity tenderness nor edema. Neurologic:  Normal speech and language. No gross focal neurologic deficits are appreciated. Speech is normal. No gait instability. Skin:  Skin is warm, dry and intact. No rash noted. Psychiatric: Mood and affect are normal. Speech and behavior are normal. Patient exhibits appropriate insight and judgment. ____________________________________________  ED COURSE:  Pertinent labs & imaging results that were available during my care of the patient were reviewed by me and considered in my medical decision making (see chart for details). Patient with history of alcohol abuse that he subsequently traded for Xanax addiction. He is stable for [facility] from medical perspective ____________________________________________    LABS (pertinent positives/negatives)  Labs Reviewed  CBC - Abnormal; Notable for the following:    RDW 14.7 (*)    All other components within normal limits  COMPREHENSIVE METABOLIC PANEL  URINE DRUG SCREEN, QUALITATIVE ([facility] ONLY)   ____________________________________________  FINAL ASSESSMENT AND PLAN  Benzodiazepine addiction  Plan: Patient is medically cleared to go to [facility], advised him to follow instructions there.   Earleen Newport, MD   Earleen Newport, MD 10/11/14 [phone]

## 2014-10-17 ENCOUNTER — Other Ambulatory Visit: Payer: Self-pay

## 2014-10-18 LAB — BASIC METABOLIC PANEL
BUN: 8 mg/dL (ref 4–21)
Creatinine: 0.9 mg/dL (ref 0.6–1.3)
GLUCOSE: 86 mg/dL
SODIUM: 138 mmol/L (ref 137–147)

## 2014-10-18 LAB — TSH: TSH: 3.66 u[IU]/mL (ref 0.41–5.90)

## 2014-10-18 LAB — HEPATIC FUNCTION PANEL: BILIRUBIN, TOTAL: 0.3 mg/dL

## 2014-10-24 ENCOUNTER — Ambulatory Visit: Payer: Self-pay

## 2014-12-24 ENCOUNTER — Encounter: Payer: Self-pay | Admitting: Pharmacist

## 2015-01-28 ENCOUNTER — Emergency Department
Admission: EM | Admit: 2015-01-28 | Discharge: 2015-01-28 | Disposition: A | Discharge status: Home / Self Care | Payer: Self-pay | Attending: Emergency Medicine | Admitting: Emergency Medicine

## 2015-01-28 ENCOUNTER — Encounter: Payer: Self-pay | Admitting: Urgent Care

## 2015-01-28 DIAGNOSIS — F410 Panic disorder [episodic paroxysmal anxiety] without agoraphobia: Secondary | ICD-10-CM

## 2015-01-28 DIAGNOSIS — Z79899 Other long term (current) drug therapy: Secondary | ICD-10-CM | POA: Insufficient documentation

## 2015-01-28 DIAGNOSIS — R112 Nausea with vomiting, unspecified: Secondary | ICD-10-CM | POA: Insufficient documentation

## 2015-01-28 DIAGNOSIS — Z7902 Long term (current) use of antithrombotics/antiplatelets: Secondary | ICD-10-CM | POA: Insufficient documentation

## 2015-01-28 DIAGNOSIS — Z720 Tobacco use: Secondary | ICD-10-CM | POA: Insufficient documentation

## 2015-01-28 DIAGNOSIS — F419 Anxiety disorder, unspecified: Secondary | ICD-10-CM | POA: Insufficient documentation

## 2015-01-28 DIAGNOSIS — I10 Essential (primary) hypertension: Secondary | ICD-10-CM | POA: Insufficient documentation

## 2015-01-28 HISTORY — DX: Essential (primary) hypertension: I10

## 2015-01-28 HISTORY — DX: Hyperlipidemia, unspecified: E78.5

## 2015-01-28 LAB — COMPREHENSIVE METABOLIC PANEL
ALT: 24 U/L (ref 17–63)
ANION GAP: 14 (ref 5–15)
AST: 24 U/L (ref 15–41)
Albumin: 4.8 g/dL (ref 3.5–5.0)
Alkaline Phosphatase: 107 U/L (ref 38–126)
BUN: 13 mg/dL (ref 6–20)
CO2: 28 mmol/L (ref 22–32)
Calcium: 10 mg/dL (ref 8.9–10.3)
Chloride: 96 mmol/L — ABNORMAL LOW (ref 101–111)
Creatinine, Ser: 1.24 mg/dL (ref 0.61–1.24)
GFR calc non Af Amer: >60 mL/min (ref >60)
Glucose, Bld: 99 mg/dL (ref 65–99)
Potassium: 4.4 mmol/L (ref 3.5–5.1)
SODIUM: 138 mmol/L (ref 135–145)
Total Bilirubin: 0.5 mg/dL (ref 0.3–1.2)
Total Protein: 9 g/dL — ABNORMAL HIGH (ref 6.5–8.1)

## 2015-01-28 LAB — URINALYSIS COMPLETE WITH MICROSCOPIC (ARMC ONLY)
Bilirubin Urine: NEGATIVE
Glucose, UA: NEGATIVE mg/dL
Hgb urine dipstick: NEGATIVE
Ketones, ur: NEGATIVE mg/dL
Leukocytes, UA: NEGATIVE
Nitrite: NEGATIVE
PH: 6 (ref 5.0–8.0)
Protein, ur: NEGATIVE mg/dL
Specific Gravity, Urine: 1.005 (ref 1.005–1.030)

## 2015-01-28 LAB — CBC
HCT: 54 % — ABNORMAL HIGH (ref 40.0–52.0)
HEMOGLOBIN: 18.2 g/dL — AB (ref 13.0–18.0)
MCH: 31.9 pg (ref 26.0–34.0)
MCHC: 33.7 g/dL (ref 32.0–36.0)
MCV: 94.8 fL (ref 80.0–100.0)
Platelets: 314 10*3/uL (ref 150–440)
RBC: 5.7 MIL/uL (ref 4.40–5.90)
RDW: 13.7 % (ref 11.5–14.5)
WBC: 14.2 10*3/uL — ABNORMAL HIGH (ref 3.8–10.6)

## 2015-01-28 LAB — TROPONIN I: Troponin I: <0.03 ng/mL (ref <0.031)

## 2015-01-28 LAB — LIPASE, BLOOD: Lipase: 26 U/L (ref 22–51)

## 2015-01-28 MED ORDER — LORAZEPAM 0.5 MG PO TABS
0.5000 mg | ORAL_TABLET | Freq: Once | ORAL | Status: AC
  Administered 2015-01-28: 0.5 mg via ORAL
  Filled 2015-01-28: qty 1

## 2015-01-28 MED ORDER — ALPRAZOLAM 0.5 MG PO TABS: 0.5000 mg | ORAL_TABLET | Freq: Three times a day (TID) | ORAL | Status: DC | PRN

## 2015-01-28 NOTE — ED Notes (Addendum)
Patient presents via EMS from home with c/o nausea, vomiting, and diaphoresis. Patient presents very anxious reporting that he has not slept in over a month d/t psychiatrist "messing with his meds". Patient reports racing thought upon arrival - asking for anxiety meds before being moved from EMS stretcher to ED bed. PMH significant for MI (2012), HTN, and anxiety.

## 2015-01-28 NOTE — Discharge Instructions (Signed)

## 2015-01-28 NOTE — ED Notes (Signed)
Spoke with Edd Fabian, MD who wants abdominal protocol on patient only at this time. MD made aware of cardiac history, however does not want troponin at this time as patient is not experiencing CP.

## 2015-01-28 NOTE — ED Notes (Signed)
Discussed discharge instructions, prescriptions, and follow-up care with patient. No questions or concerns at this time. Pt stable at discharge.  

## 2015-01-28 NOTE — ED Provider Notes (Signed)
[facility] Emergency Department Provider Note  ____________________________________________  Time seen: 7 AM  I have reviewed the triage vital signs and the nursing notes.   HISTORY  Chief Complaint Anxiety; Nausea; and Emesis    HPI Larry French is a 54 y.o. male who presents with complaints of nausea vomiting and sweating over the last day but it appeared to get worse this morning. Thinks he is having an anxiety attack and blames it on his psychiatrist adjusting his medications. He denies chest pain, he denies shortness of breath. He does have a history of heart attack in the past. No fevers chills. No abdominal pain. He denies diarrhea     Past Medical History  Diagnosis Date  . Alcohol abuse     Recovered x 15 months  . CA - skin cancer   . Mental disorder   . Headache(784.0)   . Anxiety   . Depression   . 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 Hypertension   . Hyperlipemia   . MI, old 2012    Patient Active Problem List   Diagnosis Date Noted  . STEMI, RCA DES 05/28/10 06/01/2011  . 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 Tobacco abuse 05/29/2011  . History of ETOH abuse and Xanax abuse. In rehab pta 05/29/2011    Past Surgical History  Procedure Laterality Date  . Tonsillectomy and adenoidectomy    . Left heart cath Right 05/29/2011    Procedure: LEFT HEART CATH;  Surgeon: Leonie Man, MD;  Location: [facility] CATH LAB;  Service: Cardiovascular;  Laterality: Right;  . Left heart catheterization with coronary angiogram [name] 06/01/2011    Procedure: LEFT HEART CATHETERIZATION WITH CORONARY ANGIOGRAM;  Surgeon: Leonie Man, MD;  Location: [facility] CATH LAB;  Service: Cardiovascular;  Laterality: [name];  relook cath, possible PCI    Current Outpatient Rx  Name  Route  Sig  Dispense  Refill  . metoprolol tartrate (LOPRESSOR) 12.5 mg TABS   Oral   Take 0.5 tablets (12.5 mg total) by mouth 2 (two) times daily.   30 tablet   0   .  EXPIRED: nitroGLYCERIN (NITROSTAT) 0.4 MG SL tablet   Sublingual   Place 1 tablet (0.4 mg total) under the tongue every 5 (five) minutes as needed for chest pain.   100 tablet   3   . PARoxetine (PAXIL) 10 MG tablet   Oral   Take 10 mg by mouth every morning.         . prasugrel (EFFIENT) 10 MG TABS   Oral   Take 1 tablet (10 mg total) by mouth daily.   30 tablet   0   . QUEtiapine (SEROQUEL) 100 MG tablet   Oral   Take 100 mg by mouth at bedtime.         Marland Kitchen EXPIRED: simvastatin (ZOCOR) 40 MG tablet   Oral   Take 1 tablet (40 mg total) by mouth every evening.   15 tablet   0     Patient needs to schedule appointment before futur ...   . trazodone (DESYREL) 300 MG tablet   Oral   Take 300 mg by mouth at bedtime.         . Vilazodone HCl (VIIBRYD) 40 MG TABS   Oral   Take 40 mg by mouth daily.           Allergies Review of patient's allergies indicates no known allergies.  Family History  Problem  Relation Age of Onset  . Coronary artery disease Maternal Grandfather 36    Died    Social History Social History  Substance Use Topics  . Smoking status: Current Every Day Smoker -- 1.50 packs/day for 30 years    Types: Cigarettes  . Smokeless tobacco: None  . Alcohol Use: No    Review of Systems  Constitutional: Negative for fever. Eyes: Negative for visual changes. ENT: Negative for sore throat Cardiovascular: Negative for chest pain. Respiratory: Negative for shortness of breath. Gastrointestinal: Positive for nausea Genitourinary: Negative for dysuria. Musculoskeletal: Negative for back pain. Skin: Negative for rash. Neurological: Negative for headaches Psychiatric: Significant anxiety    ____________________________________________   PHYSICAL EXAM:  VITAL SIGNS: ED Triage Vitals  Enc Vitals Group     BP 01/28/15 0646 143/85 mmHg     Pulse Rate 01/28/15 0646 57     Resp 01/28/15 0646 22     Temp 01/28/15 0646 98.6 F (37 C)      Temp Source 01/28/15 0646 Oral     SpO2 01/28/15 0646 97 %     Weight 01/28/15 0646 195 lb (88.451 kg)     Height 01/28/15 0646 5\' 8"  (1.727 m)     Head Cir --      Peak Flow --      Pain Score 01/28/15 0648 8     Pain Loc --      Pain Edu? --      Excl. in [place]? --      Constitutional: Alert and oriented. Well appearing and in no distress. Eyes: Conjunctivae are normal.  ENT   Head: Normocephalic and atraumatic.   Mouth/Throat: Mucous membranes are moist. Cardiovascular: Normal rate, regular rhythm. Normal and symmetric distal pulses are present in all extremities. No murmurs, rubs, or gallops. Respiratory: Normal respiratory effort without tachypnea nor retractions. Breath sounds are clear and equal bilaterally.  Gastrointestinal: Soft and non-tender in all quadrants. No distention. There is no CVA tenderness. Genitourinary: deferred Musculoskeletal: Nontender with normal range of motion in all extremities. No lower extremity tenderness nor edema. Neurologic:  Normal speech and language. No gross focal neurologic deficits are appreciated. Skin:  Skin is warm, dry and intact. No rash noted. Psychiatric: Mood and affect are normal. Patient exhibits appropriate insight and judgment. Patient is significantly anxious  ____________________________________________    LABS (pertinent positives/negatives)  Labs Reviewed  COMPREHENSIVE METABOLIC PANEL - Abnormal; Notable for the following:    Chloride 96 (*)    Total Protein 9.0 (*)    All other components within normal limits  CBC - Abnormal; Notable for the following:    WBC 14.2 (*)    Hemoglobin 18.2 (*)    HCT 54.0 (*)    All other components within normal limits  LIPASE, BLOOD  URINALYSIS COMPLETEWITH MICROSCOPIC ([facility] ONLY)  TROPONIN I    ____________________________________________   EKG  ED ECG REPORT I, Lavonia Drafts, the attending physician, personally viewed and interpreted this ECG.  Date:  01/28/2015 EKG Time: 6:47 AM Rate: 57 Rhythm: Sinus bradycardia QRS Axis: normal Intervals: normal ST/T Wave abnormalities: normal Conduction Disutrbances: none Narrative Interpretation: unremarkable   ____________________________________________    RADIOLOGY I have personally reviewed any xrays that were ordered on this patient: None  ____________________________________________   PROCEDURES  Procedure(s) performed: none  Critical Care performed: none  ____________________________________________   INITIAL IMPRESSION / ASSESSMENT AND PLAN / ED COURSE  Pertinent labs & imaging results that were available during my care of  the patient were reviewed by me and considered in my medical decision making (see chart for details).  Patient well-appearing and in no distress. Does have some significant anxiety which I suspect is the cause of his presentation to the ED today. We will give Ativan by mouth. His labs thus far unremarkable and his EKG is reassuring. I will add on a Troponin  to previous collection given his history although he has no chest pain.  ----------------------------------------- 8:18 AM on 01/28/2015 -----------------------------------------  Troponin negative. Patient is calm but is now a patient to leave. I'll discharge him with a few days of a Xanax refill until he can see a psychiatrist  ____________________________________________   FINAL CLINICAL IMPRESSION(S) / ED DIAGNOSES  Final diagnoses:  Anxiety attack     Lavonia Drafts, MD 01/28/15 [phone]

## 2015-02-27 ENCOUNTER — Ambulatory Visit: Payer: Self-pay

## 2015-03-20 ENCOUNTER — Ambulatory Visit: Payer: Self-pay

## 2015-03-24 ENCOUNTER — Emergency Department
Admission: EM | Admit: 2015-03-24 | Discharge: 2015-03-24 | Disposition: A | Discharge status: Home / Self Care | Payer: Self-pay | Attending: Emergency Medicine | Admitting: Emergency Medicine

## 2015-03-24 ENCOUNTER — Encounter: Payer: Self-pay | Admitting: Emergency Medicine

## 2015-03-24 ENCOUNTER — Emergency Department
Admission: EM | Admit: 2015-03-24 | Discharge: 2015-03-24 | Discharge status: Against Medical Advice | Payer: Self-pay | Attending: Emergency Medicine | Admitting: Emergency Medicine

## 2015-03-24 DIAGNOSIS — R251 Tremor, unspecified: Secondary | ICD-10-CM | POA: Insufficient documentation

## 2015-03-24 DIAGNOSIS — I10 Essential (primary) hypertension: Secondary | ICD-10-CM | POA: Insufficient documentation

## 2015-03-24 DIAGNOSIS — F17210 Nicotine dependence, cigarettes, uncomplicated: Secondary | ICD-10-CM | POA: Insufficient documentation

## 2015-03-24 DIAGNOSIS — F1020 Alcohol dependence, uncomplicated: Secondary | ICD-10-CM | POA: Insufficient documentation

## 2015-03-24 DIAGNOSIS — Z79899 Other long term (current) drug therapy: Secondary | ICD-10-CM | POA: Insufficient documentation

## 2015-03-24 DIAGNOSIS — F419 Anxiety disorder, unspecified: Secondary | ICD-10-CM | POA: Insufficient documentation

## 2015-03-24 DIAGNOSIS — F1310 Sedative, hypnotic or anxiolytic abuse, uncomplicated: Secondary | ICD-10-CM | POA: Insufficient documentation

## 2015-03-24 DIAGNOSIS — Z7902 Long term (current) use of antithrombotics/antiplatelets: Secondary | ICD-10-CM | POA: Insufficient documentation

## 2015-03-24 DIAGNOSIS — F1010 Alcohol abuse, uncomplicated: Secondary | ICD-10-CM | POA: Insufficient documentation

## 2015-03-24 HISTORY — DX: Post-traumatic stress disorder, unspecified: F43.10

## 2015-03-24 LAB — CBC WITH DIFFERENTIAL/PLATELET
Basophils Absolute: 0 10*3/uL (ref 0–0.1)
Basophils Relative: 1 %
EOS ABS: 0.1 10*3/uL (ref 0–0.7)
Eosinophils Relative: 1 %
HEMATOCRIT: 47.6 % (ref 40.0–52.0)
Hemoglobin: 16.2 g/dL (ref 13.0–18.0)
Lymphocytes Relative: 26 %
Lymphs Abs: 1.9 10*3/uL (ref 1.0–3.6)
MCH: 33 pg (ref 26.0–34.0)
MCHC: 34 g/dL (ref 32.0–36.0)
MCV: 97.1 fL (ref 80.0–100.0)
MONO ABS: 0.9 10*3/uL (ref 0.2–1.0)
MONOS PCT: 12 %
NEUTROS ABS: 4.5 10*3/uL (ref 1.4–6.5)
NEUTROS PCT: 60 %
Platelets: 156 10*3/uL (ref 150–440)
RBC: 4.9 MIL/uL (ref 4.40–5.90)
RDW: 18.6 % — AB (ref 11.5–14.5)
WBC: 7.4 10*3/uL (ref 3.8–10.6)

## 2015-03-24 LAB — COMPREHENSIVE METABOLIC PANEL
ALT: 143 U/L — ABNORMAL HIGH (ref 17–63)
ANION GAP: 10 (ref 5–15)
AST: 173 U/L — AB (ref 15–41)
Albumin: 3.9 g/dL (ref 3.5–5.0)
Alkaline Phosphatase: 121 U/L (ref 38–126)
BUN: 12 mg/dL (ref 6–20)
CHLORIDE: 98 mmol/L — AB (ref 101–111)
CO2: 25 mmol/L (ref 22–32)
Calcium: 8.9 mg/dL (ref 8.9–10.3)
Creatinine, Ser: 1.1 mg/dL (ref 0.61–1.24)
GFR calc Af Amer: >60 mL/min (ref >60)
GFR calc non Af Amer: >60 mL/min (ref >60)
GLUCOSE: 78 mg/dL (ref 65–99)
POTASSIUM: 4.1 mmol/L (ref 3.5–5.1)
SODIUM: 133 mmol/L — AB (ref 135–145)
Total Bilirubin: 0.2 mg/dL — ABNORMAL LOW (ref 0.3–1.2)
Total Protein: 6.9 g/dL (ref 6.5–8.1)

## 2015-03-24 LAB — URINE DRUG SCREEN, QUALITATIVE (ARMC ONLY)
AMPHETAMINES, UR SCREEN: NOT DETECTED
BARBITURATES, UR SCREEN: NOT DETECTED
BENZODIAZEPINE, UR SCRN: POSITIVE — AB
CANNABINOID 50 NG, UR ~~LOC~~: NOT DETECTED
Cocaine Metabolite,Ur ~~LOC~~: NOT DETECTED
MDMA (Ecstasy)Ur Screen: NOT DETECTED
Methadone Scn, Ur: NOT DETECTED
OPIATE, UR SCREEN: NOT DETECTED
PHENCYCLIDINE (PCP) UR S: NOT DETECTED
Tricyclic, Ur Screen: NOT DETECTED

## 2015-03-24 LAB — ETHANOL: Alcohol, Ethyl (B): <5 mg/dL (ref <5)

## 2015-03-24 MED ORDER — LORAZEPAM 1 MG PO TABS
ORAL_TABLET | ORAL | Status: AC
  Administered 2015-03-24: 1 mg via ORAL
  Filled 2015-03-24: qty 1

## 2015-03-24 MED ORDER — NICOTINE 21 MG/24HR TD PT24
21.0000 mg | MEDICATED_PATCH | Freq: Once | TRANSDERMAL | Status: DC
  Administered 2015-03-24: 21 mg via TRANSDERMAL
  Filled 2015-03-24: qty 1

## 2015-03-24 MED ORDER — THIAMINE HCL 100 MG/ML IJ SOLN: 100.0000 mg | Freq: Every day | INTRAMUSCULAR | Status: DC

## 2015-03-24 MED ORDER — HYDROXYZINE HCL 25 MG PO TABS: 25.0000 mg | ORAL_TABLET | Freq: Three times a day (TID) | ORAL | Status: DC | PRN

## 2015-03-24 MED ORDER — LORAZEPAM 2 MG PO TABS: 0.0000 mg | ORAL_TABLET | Freq: Two times a day (BID) | ORAL | Status: DC

## 2015-03-24 MED ORDER — VITAMIN B-1 100 MG PO TABS: 100.0000 mg | ORAL_TABLET | Freq: Every day | ORAL | Status: DC

## 2015-03-24 MED ORDER — LORAZEPAM 2 MG PO TABS
0.0000 mg | ORAL_TABLET | Freq: Four times a day (QID) | ORAL | Status: DC
  Administered 2015-03-24: 1 mg via ORAL

## 2015-03-24 MED ORDER — CHLORDIAZEPOXIDE HCL 25 MG PO CAPS
25.0000 mg | ORAL_CAPSULE | Freq: Once | ORAL | Status: AC
  Administered 2015-03-24: 25 mg via ORAL
  Filled 2015-03-24: qty 1

## 2015-03-24 NOTE — Discharge Instructions (Signed)
Alcohol Use Disorder °Alcohol use disorder is a mental disorder. It is not a one-time incident of heavy drinking. Alcohol use disorder is the excessive and uncontrollable use of alcohol over time that leads to problems with functioning in one or more areas of daily living. People with this disorder risk harming themselves and others when they drink to excess. Alcohol use disorder also can cause other mental disorders, such as mood and anxiety disorders, and serious physical problems. People with alcohol use disorder often misuse other drugs.  °Alcohol use disorder is common and widespread. Some people with this disorder drink alcohol to cope with or escape from negative life events. Others drink to relieve chronic pain or symptoms of mental illness. People with a family history of alcohol use disorder are at higher risk of losing control and using alcohol to excess.  °Drinking too much alcohol can cause injury, accidents, and health problems. One drink can be too much when you are: °· Working. °· Pregnant or breastfeeding. °· Taking medicines. Ask your doctor. °· Driving or planning to drive. °SYMPTOMS  °Signs and symptoms of alcohol use disorder may include the following:  °· Consumption of alcohol in larger amounts or over a longer period of time than intended. °· Multiple unsuccessful attempts to cut down or control alcohol use.   °· A great deal of time spent obtaining alcohol, using alcohol, or recovering from the effects of alcohol (hangover). °· A strong desire or urge to use alcohol (cravings).   °· Continued use of alcohol despite problems at work, school, or home because of alcohol use.   °· Continued use of alcohol despite problems in relationships because of alcohol use. °· Continued use of alcohol in situations when it is physically hazardous, such as driving a car. °· Continued use of alcohol despite awareness of a physical or psychological problem that is likely related to alcohol use. Physical  problems related to alcohol use can involve the brain, heart, liver, stomach, and intestines. Psychological problems related to alcohol use include intoxication, depression, anxiety, psychosis, delirium, and dementia.   °· The need for increased amounts of alcohol to achieve the same desired effect, or a decreased effect from the consumption of the same amount of alcohol (tolerance). °· Withdrawal symptoms upon reducing or stopping alcohol use, or alcohol use to reduce or avoid withdrawal symptoms. Withdrawal symptoms include: °· Racing heart. °· Hand tremor. °· Difficulty sleeping. °· Nausea. °· Vomiting. °· Hallucinations. °· Restlessness. °· Seizures. °DIAGNOSIS °Alcohol use disorder is diagnosed through an assessment by your health care provider. Your health care provider may start by asking three or four questions to screen for excessive or problematic alcohol use. To confirm a diagnosis of alcohol use disorder, at least two symptoms must be present within a 12-month period. The severity of alcohol use disorder depends on the number of symptoms: °· Mild--two or three. °· Moderate--four or five. °· Severe--six or more. °Your health care provider may perform a physical exam or use results from lab tests to see if you have physical problems resulting from alcohol use. Your health care provider may refer you to a mental health professional for evaluation. °TREATMENT  °Some people with alcohol use disorder are able to reduce their alcohol use to low-risk levels. Some people with alcohol use disorder need to quit drinking alcohol. When necessary, mental health professionals with specialized training in substance use treatment can help. Your health care provider can help you decide how severe your alcohol use disorder is and what type of treatment you need.   The following forms of treatment are available:   Detoxification. Detoxification involves the use of prescription medicines to prevent alcohol withdrawal  symptoms in the first week after quitting. This is important for people with a history of symptoms of withdrawal and for heavy drinkers who are likely to have withdrawal symptoms. Alcohol withdrawal can be dangerous and, in severe cases, cause death. Detoxification is usually provided in a hospital or in-patient substance use treatment facility.  Counseling or talk therapy. Talk therapy is provided by substance use treatment counselors. It addresses the reasons people use alcohol and ways to keep them from drinking again. The goals of talk therapy are to help people with alcohol use disorder find healthy activities and ways to cope with life stress, to identify and avoid triggers for alcohol use, and to handle cravings, which can cause relapse.  Medicines.Different medicines can help treat alcohol use disorder through the following actions:  Decrease alcohol cravings.  Decrease the positive reward response felt from alcohol use.  Produce an uncomfortable physical reaction when alcohol is used (aversion therapy).  Support groups. Support groups are run by people who have quit drinking. They provide emotional support, advice, and guidance. These forms of treatment are often combined. Some people with alcohol use disorder benefit from intensive combination treatment provided by specialized substance use treatment centers. Both inpatient and outpatient treatment programs are available.   This information is not intended to replace advice given to you by your health care provider. Make sure you discuss any questions you have with your health care provider.   Document Released: 05/13/2004 Document Revised: 04/26/2014 Document Reviewed: 07/13/2012 Elsevier Interactive Patient Education 2016 Else[employer] Inc[employer]  Panic Attacks Panic attacks are sudden, short-livedsurges of severe anxiety, fear, or discomfort. They may occur for no reason when you are relaxed, when you are anxious, or when you are sleeping.  Panic attacks may occur for a number of reasons:   Healthy people occasionally have panic attacks in extreme, life-threatening situations, such as war or natural disasters. Normal anxiety is a protective mechanism of the body that helps [place] react to danger (fight or flight response).  Panic attacks are often seen with anxiety disorders, such as panic disorder, social anxiety disorder, generalized anxiety disorder, and phobias. Anxiety disorders cause excessive or uncontrollable anxiety. They may interfere with your relationships or other life activities.  Panic attacks are sometimes seen with other mental illnesses, such as depression and posttraumatic stress disorder.  Certain medical conditions, prescription medicines, and drugs of abuse can cause panic attacks. SYMPTOMS  Panic attacks start suddenly, peak within 20 minutes, and are accompanied by four or more of the following symptoms:  Pounding heart or fast heart rate (palpitations).  Sweating.  Trembling or shaking.  Shortness of breath or feeling smothered.  Feeling choked.  Chest pain or discomfort.  Nausea or strange feeling in your stomach.  Dizziness, light-headedness, or feeling like you will faint.  Chills or hot flushes.  Numbness or tingling in your lips or hands and feet.  Feeling that things are not real or feeling that you are not yourself.  Fear of losing control or going crazy.  Fear of dying. Some of these symptoms can mimic serious medical conditions. For example, you may think you are having a heart attack. Although panic attacks can be very scary, they are not life threatening. DIAGNOSIS  Panic attacks are diagnosed through an assessment by your health care provider. Your health care provider will ask questions about your symptoms, such  as where and when they occurred. Your health care provider will also ask about your medical history and use of alcohol and drugs, including prescription medicines. Your  health care provider may order blood tests or other studies to rule out a serious medical condition. Your health care provider may refer you to a mental health professional for further evaluation. TREATMENT   Most healthy people who have one or two panic attacks in an extreme, life-threatening situation will not require treatment.  The treatment for panic attacks associated with anxiety disorders or other mental illness typically involves counseling with a mental health professional, medicine, or a combination of both. Your health care provider will help determine what treatment is best for you.  Panic attacks due to physical illness usually go away with treatment of the illness. If prescription medicine is causing panic attacks, talk with your health care provider about stopping the medicine, decreasing the dose, or substituting another medicine.  Panic attacks due to alcohol or drug abuse go away with abstinence. Some adults need professional help in order to stop drinking or using drugs. HOME CARE INSTRUCTIONS   Take all medicines as directed by your health care provider.   Schedule and attend follow-up visits as directed by your health care provider. It is important to keep all your appointments. SEEK MEDICAL CARE IF:  You are not able to take your medicines as prescribed.  Your symptoms do not improve or get worse. SEEK IMMEDIATE MEDICAL CARE IF:   You experience panic attack symptoms that are different than your usual symptoms.  You have serious thoughts about hurting yourself or others.  You are taking medicine for panic attacks and have a serious side effect. MAKE SURE YOU:  Understand these instructions.  Will watch your condition.  Will get help right away if you are not doing well or get worse.   This information is not intended to replace advice given to you by your health care provider. Make sure you discuss any questions you have with your health care provider.     Document Released: 04/05/2005 Document Revised: 04/10/2013 Document Reviewed: 11/17/2012 Elsevier Interactive Patient Education 2016 Elsevier Inc.  Generalized Anxiety Disorder Generalized anxiety disorder (GAD) is a mental disorder. It interferes with life functions, including relationships, work, and school. GAD is different from normal anxiety, which everyone experiences at some point in their lives in response to specific life events and activities. Normal anxiety actually helps [place] prepare for and get through these life events and activities. Normal anxiety goes away after the event or activity is over.  GAD causes anxiety that is not necessarily related to specific events or activities. It also causes excess anxiety in proportion to specific events or activities. The anxiety associated with GAD is also difficult to control. GAD can vary from mild to severe. People with severe GAD can have intense waves of anxiety with physical symptoms (panic attacks).  SYMPTOMS The anxiety and worry associated with GAD are difficult to control. This anxiety and worry are related to many life events and activities and also occur more days than not for 6 months or longer. People with GAD also have three or more of the following symptoms (one or more in children):  Restlessness.   Fatigue.  Difficulty concentrating.   Irritability.  Muscle tension.  Difficulty sleeping or unsatisfying sleep. DIAGNOSIS GAD is diagnosed through an assessment by your health care provider. Your health care provider will ask you questions aboutyour mood,physical symptoms, and events in your  life. Your health care provider may ask you about your medical history and use of alcohol or drugs, including prescription medicines. Your health care provider may also do a physical exam and blood tests. Certain medical conditions and the use of certain substances can cause symptoms similar to those associated with GAD. Your health  care provider may refer you to a mental health specialist for further evaluation. TREATMENT The following therapies are usually used to treat GAD:   Medication. Antidepressant medication usually is prescribed for long-term daily control. Antianxiety medicines may be added in severe cases, especially when panic attacks occur.   Talk therapy (psychotherapy). Certain types of talk therapy can be helpful in treating GAD by providing support, education, and guidance. A form of talk therapy called cognitive behavioral therapy can teach you healthy ways to think about and react to daily life events and activities.  Stress managementtechniques. These include yoga, meditation, and exercise and can be very helpful when they are practiced regularly. A mental health specialist can help determine which treatment is best for you. Some people see improvement with one therapy. However, other people require a combination of therapies.   This information is not intended to replace advice given to you by your health care provider. Make sure you discuss any questions you have with your health care provider.   Document Released: 07/31/2012 Document Revised: 04/26/2014 Document Reviewed: 07/31/2012 Elsevier Interactive Patient Education [employer].
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
## 2015-03-24 NOTE — ED Provider Notes (Addendum)
[facility] Emergency Department Provider Note     Time seen: ----------------------------------------- 12:12 PM on 03/24/2015 -----------------------------------------    I have reviewed the triage vital signs and the nursing notes.   HISTORY  Chief Complaint No chief complaint on file.    HPI Larry French is a 54 y.o. male who presents ER requesting detox from alcohol. Patient states he last had alcohol yesterday morning. Patient also was taking alcohol disease Xanax withdrawal which he had run out of.Patient denies any fevers or chills, does feel shaky and anxious.   Past Medical History  Diagnosis Date  . Alcohol abuse     Recovered x 15 months  . CA - skin cancer   . Mental disorder   . Headache(784.0)   . Anxiety   . Depression   . 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 Hypertension   . Hyperlipemia   . MI, old 2012    Patient Active Problem List   Diagnosis Date Noted  . STEMI, RCA DES 05/28/10 06/01/2011  . 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 Tobacco abuse 05/29/2011  . History of ETOH abuse and Xanax abuse. In rehab pta 05/29/2011    Past Surgical History  Procedure Laterality Date  . Tonsillectomy and adenoidectomy    . Left heart cath Right 05/29/2011    Procedure: LEFT HEART CATH;  Surgeon: Leonie Man, MD;  Location: [facility] CATH LAB;  Service: Cardiovascular;  Laterality: Right;  . Left heart catheterization with coronary angiogram [name] 06/01/2011    Procedure: LEFT HEART CATHETERIZATION WITH CORONARY ANGIOGRAM;  Surgeon: Leonie Man, MD;  Location: [facility] CATH LAB;  Service: Cardiovascular;  Laterality: [name];  relook cath, possible PCI    Allergies Review of patient's allergies indicates no known allergies.  Social History Social History  Substance Use Topics  . Smoking status: Current Every Day Smoker -- 1.50 packs/day for 30 years    Types: Cigarettes  . Smokeless tobacco: Not on file  . Alcohol Use:  No    Review of Systems Constitutional: Negative for fever. Eyes: Negative for visual changes. ENT: Negative for sore throat. Cardiovascular: Negative for chest pain. Respiratory: Negative for shortness of breath. Gastrointestinal: Negative for abdominal pain, vomiting and diarrhea. Genitourinary: Negative for dysuria. Musculoskeletal: Negative for back pain. Skin: Negative for rash. Neurological: Negative for headaches, focal weakness or numbness. Psychiatric: Positive for anxiety and alcohol abuse  10-point ROS otherwise negative.  ____________________________________________   PHYSICAL EXAM:  VITAL SIGNS: ED Triage Vitals  Enc Vitals Group     BP --      Pulse --      Resp --      Temp --      Temp src --      SpO2 --      Weight --      Height --      Head Cir --      Peak Flow --      Pain Score --      Pain Loc --      Pain Edu? --      Excl. in [place]? --     Constitutional: Alert and oriented. Well appearing and in no distress. Eyes: Conjunctivae are normal. PERRL. Normal extraocular movements. ENT   Head: Normocephalic and atraumatic.   Nose: No congestion/rhinnorhea.   Mouth/Throat: Mucous membranes are moist.   Neck: No stridor. Cardiovascular: Normal rate, regular rhythm. Normal and symmetric distal pulses are present  in all extremities. No murmurs, rubs, or gallops. Respiratory: Normal respiratory effort without tachypnea nor retractions. Breath sounds are clear and equal bilaterally. No wheezes/rales/rhonchi. Gastrointestinal: Soft and nontender. No distention. No abdominal bruits.  Musculoskeletal: Nontender with normal range of motion in all extremities. No joint effusions.  No lower extremity tenderness nor edema. Neurologic:  Normal speech and language. No gross focal neurologic deficits are appreciated. Speech is normal. No gait instability. Tremor is noted. Skin:  Skin is warm, dry and intact. No rash noted. Psychiatric: Mood and  affect are normal. Speech and behavior are normal. Patient exhibits appropriate insight and judgment. ____________________________________________  ED COURSE:  Pertinent labs & imaging results that were available during my care of the patient were reviewed by me and considered in my medical decision making (see chart for details). Patient is in no acute distress, will start on CIWA protocol and attempts detox placement. ____________________________________________    LABS (pertinent positives/negatives)  Labs Reviewed  CBC WITH DIFFERENTIAL/PLATELET - Abnormal; Notable for the following:    RDW 18.6 (*)    All other components within normal limits  COMPREHENSIVE METABOLIC PANEL - Abnormal; Notable for the following:    Sodium 133 (*)    Chloride 98 (*)    AST 173 (*)    ALT 143 (*)    Total Bilirubin 0.2 (*)    All other components within normal limits  ETHANOL  URINE DRUG SCREEN, QUALITATIVE ([facility] ONLY)   ___________________________________________  FINAL ASSESSMENT AND PLAN  Alcohol withdrawal, anxiety, benzodiazepine abuse  Plan: Patient with labs and as dictated above. Patient was started on CIWA protocol, he is medically stable for detox referral.   Earleen Newport, MD  Patient remains medically stable, is requesting to leave [place]. Currently is medically stable to do so. Earleen Newport, MD 03/24/15 1214  Earleen Newport, MD 03/24/15 [id number]

## 2015-03-24 NOTE — ED Notes (Signed)
Patient presents to the ED requesting alcohol detox and assistance with his anxiety. Patient reports he has been taking xanax for anxiety but ran out about 1 week ago.  Patient states when he stopped taking the xanax he started drinking and has been drinking about 4, 40oz of beer per day since that time.  Patient states he last had alcohol yesterday morning and he is feeling very shaky.  Patient states he has a history of anxiety and PTSD.  Patient denies SI and HI.

## 2015-03-24 NOTE — ED Provider Notes (Addendum)
[facility] Emergency Department Provider Note  ____________________________________________  Time seen: 4:20 PM  I have reviewed the triage vital signs and the nursing notes.   HISTORY  Chief Complaint Alcohol Problem    HPI ZIYON BARRONE is a 54 y.o. male who complains of alcohol withdrawal requesting detox. He has a history of long-standing Xanax use, but a while back was unable to refill his Xanax, so he started drinking alcohol. Been drinking large quantities of beer daily. His last check was yesterday. He was seen earlier today requesting detox but when told that there was no inpatient detox here at [facility], he left the hospital.  Claiborne Billings states that he feels anxious and like he is in withdrawal. Requests a nerve pill and a warm blanket.     Past Medical History  Diagnosis Date  . Alcohol abuse     Recovered x 15 months  . CA - skin cancer   . Mental disorder   . Headache(784.0)   . Anxiety   . Depression   . 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 Hypertension   . Hyperlipemia   . MI, old 2012  . PTSD (post-traumatic stress disorder)      Patient Active Problem List   Diagnosis Date Noted  . STEMI, RCA DES 05/28/10 06/01/2011  . 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 Tobacco abuse 05/29/2011  . History of ETOH abuse and Xanax abuse. In rehab pta 05/29/2011     Past Surgical History  Procedure Laterality Date  . Tonsillectomy and adenoidectomy    . Left heart cath Right 05/29/2011    Procedure: LEFT HEART CATH;  Surgeon: Leonie Man, MD;  Location: [facility] CATH LAB;  Service: Cardiovascular;  Laterality: Right;  . Left heart catheterization with coronary angiogram [name] 06/01/2011    Procedure: LEFT HEART CATHETERIZATION WITH CORONARY ANGIOGRAM;  Surgeon: Leonie Man, MD;  Location: [facility] CATH LAB;  Service: Cardiovascular;  Laterality: [name];  relook cath, possible PCI     Current Outpatient Rx  Name  Route  Sig   Dispense  Refill  . ALPRAZolam (XANAX) 0.5 MG tablet   Oral   Take 1 tablet (0.5 mg total) by mouth 3 (three) times daily as needed for sleep or anxiety.   15 tablet   0   . ALPRAZolam (XANAX) 1 MG tablet   Oral   Take 1 mg by mouth 4 (four) times daily.         . clopidogrel (PLAVIX) 75 MG tablet   Oral   Take 75 mg by mouth daily.         . hydrOXYzine (ATARAX/VISTARIL) 25 MG tablet   Oral   Take 1 tablet (25 mg total) by mouth 3 (three) times daily as needed for anxiety.   30 tablet   0   . hydrOXYzine (VISTARIL) 50 MG capsule   Oral   Take 50 mg by mouth 2 (two) times daily as needed for anxiety or itching.         . losartan (COZAAR) 50 MG tablet   Oral   Take 25 mg by mouth daily.         . metoprolol tartrate (LOPRESSOR) 12.5 mg TABS   Oral   Take 0.5 tablets (12.5 mg total) by mouth 2 (two) times daily.   30 tablet   0   . metoprolol tartrate (LOPRESSOR) 25 MG tablet   Oral   Take 25 mg by mouth  2 (two) times daily.         Marland Kitchen EXPIRED: nitroGLYCERIN (NITROSTAT) 0.4 MG SL tablet   Sublingual   Place 1 tablet (0.4 mg total) under the tongue every 5 (five) minutes as needed for chest pain.   100 tablet   3   . nitroGLYCERIN (NITROSTAT) 0.4 MG SL tablet   Sublingual   Place 0.4 mg under the tongue every 5 (five) minutes as needed for chest pain.         . prasugrel (EFFIENT) 10 MG TABS   Oral   Take 1 tablet (10 mg total) by mouth daily.   30 tablet   0   . QUEtiapine (SEROQUEL) 100 MG tablet   Oral   Take 200 mg by mouth at bedtime.          Marland Kitchen EXPIRED: simvastatin (ZOCOR) 40 MG tablet   Oral   Take 1 tablet (40 mg total) by mouth every evening.   15 tablet   0     Patient needs to schedule appointment before futur ...   . simvastatin (ZOCOR) 40 MG tablet   Oral   Take 40 mg by mouth every morning.         . traZODone (DESYREL) 100 MG tablet   Oral   Take 200 mg by mouth at bedtime.         . Vortioxetine HBr (TRINTELLIX)  10 MG TABS   Oral   Take 10 mg by mouth daily.            Allergies Review of patient's allergies indicates no known allergies.   Family History  Problem Relation Age of Onset  . Coronary artery disease Maternal Grandfather 58    Died    Social History Social History  Substance Use Topics  . Smoking status: Current Every Day Smoker -- 1.50 packs/day for 30 years    Types: Cigarettes  . Smokeless tobacco: None  . Alcohol Use: Yes     Comment: this week    Review of Systems  Constitutional:   No fever or chills. No weight changes Eyes:   No blurry vision or double vision.  ENT:   No sore throat. Cardiovascular:   No chest pain. Respiratory:   No dyspnea or cough. Gastrointestinal:   Negative for abdominal pain, vomiting and diarrhea.  No BRBPR or melena. Genitourinary:   Negative for dysuria, urinary retention, bloody urine, or difficulty urinating. Musculoskeletal:   Negative for back pain. No joint swelling or pain. Skin:   Negative for rash. Neurological:   Negative for headaches, focal weakness or numbness. Psychiatric:  Positive anxiety.   Endocrine:  No hot/cold intolerance, changes in energy, or sleep difficulty.  10-point ROS otherwise negative.  ____________________________________________   PHYSICAL EXAM:  VITAL SIGNS: ED Triage Vitals  Enc Vitals Group     BP 03/24/15 1555 111/72 mmHg     Pulse Rate 03/24/15 1555 90     Resp 03/24/15 1555 16     Temp 03/24/15 1555 97.8 F (36.6 C)     Temp Source 03/24/15 1555 Oral     SpO2 03/24/15 1555 100 %     Weight 03/24/15 1555 190 lb (86.183 kg)     Height 03/24/15 1555 5\' 9"  (1.753 m)     Head Cir --      Peak Flow --      Pain Score 03/24/15 1556 10     Pain Loc --  Pain Edu? --      Excl. in [place]? --      Constitutional:   Alert and oriented. Well appearing and in no distress. Eyes:   No scleral icterus. No conjunctival pallor. PERRL. EOMI ENT   Head:   Normocephalic and atraumatic.  No flushing   Nose:   No congestion/rhinnorhea. No septal hematoma   Mouth/Throat:   MMM, no pharyngeal erythema. No peritonsillar mass. No uvula shift. No tongue fasciculations   Neck:   No stridor. No SubQ emphysema. No meningismus. Hematological/Lymphatic/Immunilogical:   No cervical lymphadenopathy. Cardiovascular:   RRR. Normal and symmetric distal pulses are present in all extremities. No murmurs, rubs, or gallops. Respiratory:   Normal respiratory effort without tachypnea nor retractions. Breath sounds are clear and equal bilaterally. No wheezes/rales/rhonchi. Gastrointestinal:   Soft and nontender. No distention. There is no CVA tenderness.  No rebound, rigidity, or guarding. Genitourinary:   deferred Musculoskeletal:   Nontender with normal range of motion in all extremities. No joint effusions.  No lower extremity tenderness.  No edema. Neurologic:   Normal speech and language.  CN 2-10 normal. Motor grossly intact. No pronator drift.  Normal gait. No gross focal neurologic deficits are appreciated.  Skin:    Skin is warm, dry and intact. No rash noted.  No petechiae, purpura, or bullae. Psychiatric:   Mood and affect are normal. Speech and behavior are normal. Patient exhibits appropriate insight and judgment.  ____________________________________________    LABS (pertinent positives/negatives) (all labs ordered are listed, but only abnormal results are displayed) Labs Reviewed - No data to display ____________________________________________   EKG  Interpreted by me  Date: 03/24/2015  Rate: 91  Rhythm: normal sinus rhythm  QRS Axis: normal  Intervals: normal  ST/T Wave abnormalities: normal  Conduction Disutrbances: none  Narrative Interpretation: unremarkable      ____________________________________________     RADIOLOGY    ____________________________________________   PROCEDURES   ____________________________________________   INITIAL IMPRESSION / ASSESSMENT AND PLAN / ED COURSE  Pertinent labs & imaging results that were available during my care of the patient were reviewed by me and considered in my medical decision making (see chart for details).  Patient presents with complaints of alcohol withdrawal. He last had alcohol yesterday. He also is formally dependent on Xanax. No evidence of withdrawal at this time. He symptomatically feels anxious and is worried that he is withdrawing, but has normal vital signs, no flushing diaphoresis or fasciculations or tremor. He is medically stable. He did have a friend arrive at the emergency department waiting room same a could drive him to our day to get his Xanax refilled, but the patient states that he no longer willing to go that route and wants to pursue detox primarily. No labs indicated at this time. We'll give the patient oral Librium for symptomatic relief. Will follow-up with [facility] for possible referral to [facility].Marland Kitchen  ----------------------------------------- 9:25 PM on 03/24/2015 -----------------------------------------  Discussed with [facility]. Referral to [facility] placed. Patient complains of anxiety. [facility] declines to take this patient due to cardiac history. Patient remains with normal vital signs, no evidence of alcohol or benzodiazepine withdrawal. Patient was found smoking in the bathroom. At this point we'll discharge him and have him follow-up with [facility]. I'll give him hydroxyzine for his anxiety. Low suspicion for significant chemical dependency.   ____________________________________________   FINAL CLINICAL IMPRESSION(S) / ED DIAGNOSES  Final diagnoses:  Uncomplicated alcohol dependence ([place])  Anxiety      Carrie Mew,  MD 03/24/15 1826  Carrie Mew, MD 03/24/15 2126

## 2015-03-24 NOTE — BH Assessment (Signed)
Per request ER MD (Dr. Joni Fears), writer talked with patient about Detox. Patient states "I can't go to [facility], because they won't let me have m Xanax." Writer explained to him, no detox facility will allow him to have Xanax while he is their program. Patient mentioned "my mom talked with some place in [facility] and they may let me go there..." Writer reiterated to the patient, Xanax wasn't an optioned. If he was referring to "[facility] [employer]," it will definitely be a no. Writer asked the patient again, what could the ER do for him. Patient stated, I guess nothing.  Writer explained to him, he will talk with the ER MD and he will more than likely be discharged. Patient requested to be stay the night. Writer told him, he will be discharged to the Lobby due to not being in acute distress.  While writing this note, the patient came to the nurse's station and stated he changed his mind. This escorted the patient back to his room and discussed with him, his options told him. discussed with him about his motivation for detox. Writer updated the ER MD(Dr. Joni Fears).    Writer had patient to sign a Release of Information/Informed Consent for [facility]. ER MD note faxed to [facility]. At this point, patient is pending review with [facility].

## 2015-03-24 NOTE — ED Notes (Signed)
Patient reports he is here for alcohol detox

## 2015-03-24 NOTE — ED Notes (Signed)
Report received from [employer].  Pt in room. No complaints or concerns voiced at this time. No abnormal behavior noted at this time. Will continue to monitor with q15 min checks. ODS officer in area.

## 2015-03-24 NOTE — ED Notes (Signed)
Patient presents to the ED after leaving the ED AMA about 1 hour prior.  Patient was seen earlier today to detox from alcohol.  Patient states he left AMA because, "I felt like I was ready."  Patient states he planned on going to his parents house and taking "a couple" of his mother's xanax pills until he could get someone to take him to [place] to get his own Xanax.  Patient reports last drinking alcohol yesterday morning.  Patient states he feels, "really bad."  Patient states, "my heart started hurting."  Patient was unable to get a ride home and was told by his parents they do not want him to come back to their house, where he was staying, at this time.

## 2015-03-24 NOTE — ED Notes (Addendum)
Patient's home medications not removed from his possession when brought to room 20. Pt attempted to take 2-200mg  Ibuprofen, 2--25mg  metoprolol and 1-1mg  terazosin.  Pt informed about policy for not taking home medications while in the Emergency Dept. Pt politely gave up pills he was going to take and surrendered bag with home medications.   Pt requested a cold glass of water to drink to cool off.

## 2015-03-24 NOTE — ED Notes (Signed)

## 2015-03-25 ENCOUNTER — Encounter: Payer: Self-pay | Admitting: Pharmacist

## 2015-03-27 ENCOUNTER — Encounter: Payer: Self-pay | Admitting: Pharmacist

## 2015-04-01 ENCOUNTER — Emergency Department
Admission: EM | Admit: 2015-04-01 | Discharge: 2015-04-01 | Disposition: A | Discharge status: Home / Self Care | Payer: Self-pay | Attending: Emergency Medicine | Admitting: Emergency Medicine

## 2015-04-01 ENCOUNTER — Encounter: Payer: Self-pay | Admitting: Emergency Medicine

## 2015-04-01 DIAGNOSIS — F1010 Alcohol abuse, uncomplicated: Secondary | ICD-10-CM | POA: Insufficient documentation

## 2015-04-01 DIAGNOSIS — Z79899 Other long term (current) drug therapy: Secondary | ICD-10-CM | POA: Insufficient documentation

## 2015-04-01 DIAGNOSIS — F17210 Nicotine dependence, cigarettes, uncomplicated: Secondary | ICD-10-CM | POA: Insufficient documentation

## 2015-04-01 DIAGNOSIS — I10 Essential (primary) hypertension: Secondary | ICD-10-CM | POA: Insufficient documentation

## 2015-04-01 NOTE — ED Notes (Addendum)
Pt presents for medical clearance to be accepted at [facility].   Pt voluntary and is going to [facility] for alcohol detox. Pt denies any thoughts of SI or HI>  No acute distress noted.

## 2015-04-01 NOTE — ED Notes (Signed)
Pt wants medical clearance to go to [facility]. Pt wants detox from alcohol. Denies SI or HI.

## 2015-04-01 NOTE — ED Notes (Signed)
MD at bedside. 

## 2015-04-01 NOTE — ED Provider Notes (Signed)
[facility] Emergency Department Provider Note   ____________________________________________  Time seen: 1550  I have reviewed the triage vital signs and the nursing notes.   HISTORY  Chief Complaint Medical Clearance   History limited by: Not Limited   HPI Larry French is a 54 y.o. male who presented to the emergency department today because he was told to come here for medical detox by [facility]. The patient has no current medical complaints. States that he has been drinking a large amount of alcohol recently since taking over care of his parents. He states that he is ready to detox. He is also on xanax however states he will try to detox from that after he is done detoxing from alcohol.  Past Medical History  Diagnosis Date  . Alcohol abuse     Recovered x 15 months  . CA - skin cancer   . Mental disorder   . Headache(784.0)   . Anxiety   . Depression   . 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 Hypertension   . Hyperlipemia   . MI, old 2012  . PTSD (post-traumatic stress disorder)     Patient Active Problem List   Diagnosis Date Noted  . STEMI, RCA DES 05/28/10 06/01/2011  . 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 Tobacco abuse 05/29/2011  . History of ETOH abuse and Xanax abuse. In rehab pta 05/29/2011    Past Surgical History  Procedure Laterality Date  . Tonsillectomy and adenoidectomy    . Left heart cath Right 05/29/2011    Procedure: LEFT HEART CATH;  Surgeon: Leonie Man, MD;  Location: [facility] CATH LAB;  Service: Cardiovascular;  Laterality: Right;  . Left heart catheterization with coronary angiogram [name] 06/01/2011    Procedure: LEFT HEART CATHETERIZATION WITH CORONARY ANGIOGRAM;  Surgeon: Leonie Man, MD;  Location: [facility] CATH LAB;  Service: Cardiovascular;  Laterality: [name];  relook cath, possible PCI    Current Outpatient Rx  Name  Route  Sig  Dispense  Refill  . ALPRAZolam (XANAX) 0.5 MG tablet   Oral   Take 1 tablet  (0.5 mg total) by mouth 3 (three) times daily as needed for sleep or anxiety.   15 tablet   0   . ALPRAZolam (XANAX) 1 MG tablet   Oral   Take 1 mg by mouth 4 (four) times daily.         . clopidogrel (PLAVIX) 75 MG tablet   Oral   Take 75 mg by mouth daily.         . hydrOXYzine (ATARAX/VISTARIL) 25 MG tablet   Oral   Take 1 tablet (25 mg total) by mouth 3 (three) times daily as needed for anxiety.   30 tablet   0   . hydrOXYzine (VISTARIL) 50 MG capsule   Oral   Take 50 mg by mouth 2 (two) times daily as needed for anxiety or itching.         . losartan (COZAAR) 50 MG tablet   Oral   Take 25 mg by mouth daily.         . metoprolol tartrate (LOPRESSOR) 12.5 mg TABS   Oral   Take 0.5 tablets (12.5 mg total) by mouth 2 (two) times daily.   30 tablet   0   . metoprolol tartrate (LOPRESSOR) 25 MG tablet   Oral   Take 25 mg by mouth 2 (two) times daily.         Marland Kitchen  EXPIRED: nitroGLYCERIN (NITROSTAT) 0.4 MG SL tablet   Sublingual   Place 1 tablet (0.4 mg total) under the tongue every 5 (five) minutes as needed for chest pain.   100 tablet   3   . nitroGLYCERIN (NITROSTAT) 0.4 MG SL tablet   Sublingual   Place 0.4 mg under the tongue every 5 (five) minutes as needed for chest pain.         . prasugrel (EFFIENT) 10 MG TABS   Oral   Take 1 tablet (10 mg total) by mouth daily.   30 tablet   0   . QUEtiapine (SEROQUEL) 100 MG tablet   Oral   Take 200 mg by mouth at bedtime.          Marland Kitchen EXPIRED: simvastatin (ZOCOR) 40 MG tablet   Oral   Take 1 tablet (40 mg total) by mouth every evening.   15 tablet   0     Patient needs to schedule appointment before futur ...   . simvastatin (ZOCOR) 40 MG tablet   Oral   Take 40 mg by mouth every morning.         . traZODone (DESYREL) 100 MG tablet   Oral   Take 200 mg by mouth at bedtime.         . Vortioxetine HBr (TRINTELLIX) 10 MG TABS   Oral   Take 10 mg by mouth daily.            Allergies Review of patient's allergies indicates no known allergies.  Family History  Problem Relation Age of Onset  . Coronary artery disease Maternal Grandfather 78    Died    Social History Social History  Substance Use Topics  . Smoking status: Current Every Day Smoker -- 1.50 packs/day for 30 years    Types: Cigarettes  . Smokeless tobacco: None  . Alcohol Use: Yes     Comment: this week    Review of Systems  Constitutional: Negative for fever. Cardiovascular: Negative for chest pain. Respiratory: Negative for shortness of breath. Gastrointestinal: Negative for abdominal pain, vomiting and diarrhea. Neurological: Negative for headaches, focal weakness or numbness.  10-point ROS otherwise negative.  ____________________________________________   PHYSICAL EXAM:  VITAL SIGNS: ED Triage Vitals  Enc Vitals Group     BP 04/01/15 1337 136/91 mmHg     Pulse Rate 04/01/15 1337 83     Resp 04/01/15 1337 18     Temp 04/01/15 1337 98.1 F (36.7 C)     Temp Source 04/01/15 1337 Oral     SpO2 04/01/15 1337 96 %     Weight 04/01/15 1337 195 lb (88.451 kg)     Height 04/01/15 1337 5\' 9"  (1.753 m)   Constitutional: Alert and oriented. Well appearing and in no distress. Eyes: Conjunctivae are normal. PERRL. Normal extraocular movements. ENT   Head: Normocephalic and atraumatic.   Nose: No congestion/rhinnorhea.   Mouth/Throat: Mucous membranes are moist.   Neck: No stridor. Hematological/Lymphatic/Immunilogical: No cervical lymphadenopathy. Cardiovascular: Normal rate, regular rhythm.  No murmurs, rubs, or gallops. Respiratory: Normal respiratory effort without tachypnea nor retractions. Breath sounds are clear and equal bilaterally. No wheezes/rales/rhonchi. Gastrointestinal: Soft and nontender. No distention.  Genitourinary: Deferred Musculoskeletal: Normal range of motion in all extremities. No joint effusions.  No lower extremity tenderness nor  edema. Neurologic:  Normal speech and language. No gross focal neurologic deficits are appreciated.  Skin:  Skin is warm, dry and intact. No rash noted. Psychiatric: Mood and affect  are normal. Speech and behavior are normal. Patient exhibits appropriate insight and judgment.  ____________________________________________    LABS (pertinent positives/negatives)  None  ____________________________________________   EKG  None  ____________________________________________    RADIOLOGY  None  ____________________________________________   PROCEDURES  Procedure(s) performed: None  Critical Care performed: No  ____________________________________________   INITIAL IMPRESSION / ASSESSMENT AND PLAN / ED COURSE  Pertinent labs & imaging results that were available during my care of the patient were reviewed by me and considered in my medical decision making (see chart for details).  Patient presented to the emergency department today because he was told to come to the emergency department for medical clearance prior to going to [facility] for alcohol draw. The patient does not have any medical complaints. Blood work done on the fifth of this month without any overly concerning results, mild elevation of the LFTs likely secondary to alcohol use. Patient appears well. Physical exam is benign. Will discharge to have patient follow up at [facility].  ____________________________________________   FINAL CLINICAL IMPRESSION(S) / ED DIAGNOSES  Final diagnoses:  Alcohol abuse     Nance Pear, MD 04/01/15 1609

## 2015-04-01 NOTE — Discharge Instructions (Signed)
It is safe for you to proceed with detox at [facility]. Please seek medical attention for any high fevers, chest pain, shortness of breath, change in behavior, persistent vomiting, bloody stool or any other new or concerning symptoms.   Alcohol Use Disorder Alcohol use disorder is a mental disorder. It is not a one-time incident of heavy drinking. Alcohol use disorder is the excessive and uncontrollable use of alcohol over time that leads to problems with functioning in one or more areas of daily living. People with this disorder risk harming themselves and others when they drink to excess. Alcohol use disorder also can cause other mental disorders, such as mood and anxiety disorders, and serious physical problems. People with alcohol use disorder often misuse other drugs.  Alcohol use disorder is common and widespread. Some people with this disorder drink alcohol to cope with or escape from negative life events. Others drink to relieve chronic pain or symptoms of mental illness. People with a family history of alcohol use disorder are at higher risk of losing control and using alcohol to excess.  Drinking too much alcohol can cause injury, accidents, and health problems. One drink can be too much when you are:  Working.  Pregnant or breastfeeding.  Taking medicines. Ask your doctor.  Driving or planning to drive. SYMPTOMS  Signs and symptoms of alcohol use disorder may include the following:   Consumption ofalcohol inlarger amounts or over a longer period of time than intended.  Multiple unsuccessful attempts to cutdown or control alcohol use.   A great deal of time spent obtaining alcohol, using alcohol, or recovering from the effects of alcohol (hangover).  A strong desire or urge to use alcohol (cravings).   Continued use of alcohol despite problems at work, school, or home because of alcohol use.   Continued use of alcohol despite problems in relationships because of alcohol  use.  Continued use of alcohol in situations when it is physically hazardous, such as driving a car.  Continued use of alcohol despite awareness of a physical or psychological problem that is likely related to alcohol use. Physical problems related to alcohol use can involve the brain, heart, liver, stomach, and intestines. Psychological problems related to alcohol use include intoxication, depression, anxiety, psychosis, delirium, and dementia.   The need for increased amounts of alcohol to achieve the same desired effect, or a decreased effect from the consumption of the same amount of alcohol (tolerance).  Withdrawal symptoms upon reducing or stopping alcohol use, or alcohol use to reduce or avoid withdrawal symptoms. Withdrawal symptoms include:  Racing heart.  Hand tremor.  Difficulty sleeping.  Nausea.  Vomiting.  Hallucinations.  Restlessness.  Seizures. DIAGNOSIS Alcohol use disorder is diagnosed through an assessment by your health care provider. Your health care provider may start by asking three or four questions to screen for excessive or problematic alcohol use. To confirm a diagnosis of alcohol use disorder, at least two symptoms must be present within a 44-month period. The severity of alcohol use disorder depends on the number of symptoms:  Mild--two or three.  Moderate--four or five.  Severe--six or more. Your health care provider may perform a physical exam or use results from lab tests to see if you have physical problems resulting from alcohol use. Your health care provider may refer you to a mental health professional for evaluation. TREATMENT  Some people with alcohol use disorder are able to reduce their alcohol use to low-risk levels. Some people with alcohol use disorder need  to quit drinking alcohol. When necessary, mental health professionals with specialized training in substance use treatment can help. Your health care provider can help you decide how  severe your alcohol use disorder is and what type of treatment you need. The following forms of treatment are available:   Detoxification. Detoxification involves the use of prescription medicines to prevent alcohol withdrawal symptoms in the first week after quitting. This is important for people with a history of symptoms of withdrawal and for heavy drinkers who are likely to have withdrawal symptoms. Alcohol withdrawal can be dangerous and, in severe cases, cause death. Detoxification is usually provided in a hospital or in-patient substance use treatment facility.  Counseling or talk therapy. Talk therapy is provided by substance use treatment counselors. It addresses the reasons people use alcohol and ways to keep them from drinking again. The goals of talk therapy are to help people with alcohol use disorder find healthy activities and ways to cope with life stress, to identify and avoid triggers for alcohol use, and to handle cravings, which can cause relapse.  Medicines.Different medicines can help treat alcohol use disorder through the following actions:  Decrease alcohol cravings.  Decrease the positive reward response felt from alcohol use.  Produce an uncomfortable physical reaction when alcohol is used (aversion therapy).  Support groups. Support groups are run by people who have quit drinking. They provide emotional support, advice, and guidance. These forms of treatment are often combined. Some people with alcohol use disorder benefit from intensive combination treatment provided by specialized substance use treatment centers. Both inpatient and outpatient treatment programs are available.   This information is not intended to replace advice given to you by your health care provider. Make sure you discuss any questions you have with your health care provider.   Document Released: 05/13/2004 Document Revised: 04/26/2014 Document Reviewed: 07/13/2012 Elsevier Interactive Patient  Education [employer].

## 2015-04-01 NOTE — ED Notes (Signed)
Pt alert and oriented X4, active, cooperative, pt in NAD. RR even and unlabored, color WNL.  Pt informed to return if any life threatening symptoms occur.   

## 2015-04-05 ENCOUNTER — Emergency Department: Payer: Self-pay

## 2015-04-05 ENCOUNTER — Inpatient Hospital Stay
Admission: EM | Admit: 2015-04-05 | Discharge: 2015-04-10 | DRG: 981 | Disposition: A | Discharge status: Home / Self Care | Payer: Self-pay | Attending: Specialist | Admitting: Specialist

## 2015-04-05 ENCOUNTER — Inpatient Hospital Stay: Payer: Self-pay

## 2015-04-05 ENCOUNTER — Encounter: Payer: Self-pay | Admitting: *Deleted

## 2015-04-05 DIAGNOSIS — Z814 Family history of other substance abuse and dependence: Secondary | ICD-10-CM

## 2015-04-05 DIAGNOSIS — E785 Hyperlipidemia, unspecified: Secondary | ICD-10-CM | POA: Diagnosis present

## 2015-04-05 DIAGNOSIS — I214 Non-ST elevation (NSTEMI) myocardial infarction: Secondary | ICD-10-CM | POA: Insufficient documentation

## 2015-04-05 DIAGNOSIS — I10 Essential (primary) hypertension: Secondary | ICD-10-CM | POA: Diagnosis present

## 2015-04-05 DIAGNOSIS — F319 Bipolar disorder, unspecified: Secondary | ICD-10-CM | POA: Diagnosis present

## 2015-04-05 DIAGNOSIS — F13239 Sedative, hypnotic or anxiolytic dependence with withdrawal, unspecified: Secondary | ICD-10-CM | POA: Diagnosis present

## 2015-04-05 DIAGNOSIS — R9439 Abnormal result of other cardiovascular function study: Secondary | ICD-10-CM | POA: Insufficient documentation

## 2015-04-05 DIAGNOSIS — Z818 Family history of other mental and behavioral disorders: Secondary | ICD-10-CM

## 2015-04-05 DIAGNOSIS — F4310 Post-traumatic stress disorder, unspecified: Secondary | ICD-10-CM | POA: Diagnosis present

## 2015-04-05 DIAGNOSIS — F10239 Alcohol dependence with withdrawal, unspecified: Principal | ICD-10-CM | POA: Diagnosis present

## 2015-04-05 DIAGNOSIS — Z955 Presence of coronary angioplasty implant and graft: Secondary | ICD-10-CM

## 2015-04-05 DIAGNOSIS — F13939 Sedative, hypnotic or anxiolytic use, unspecified with withdrawal, unspecified: Secondary | ICD-10-CM | POA: Diagnosis present

## 2015-04-05 DIAGNOSIS — Z7982 Long term (current) use of aspirin: Secondary | ICD-10-CM

## 2015-04-05 DIAGNOSIS — I2510 Atherosclerotic heart disease of native coronary artery without angina pectoris: Secondary | ICD-10-CM | POA: Diagnosis present

## 2015-04-05 DIAGNOSIS — F419 Anxiety disorder, unspecified: Secondary | ICD-10-CM | POA: Diagnosis present

## 2015-04-05 DIAGNOSIS — R9431 Abnormal electrocardiogram [ECG] [EKG]: Secondary | ICD-10-CM | POA: Insufficient documentation

## 2015-04-05 DIAGNOSIS — R7989 Other specified abnormal findings of blood chemistry: Secondary | ICD-10-CM | POA: Insufficient documentation

## 2015-04-05 DIAGNOSIS — Z85828 Personal history of other malignant neoplasm of skin: Secondary | ICD-10-CM

## 2015-04-05 DIAGNOSIS — I4901 Ventricular fibrillation: Secondary | ICD-10-CM | POA: Diagnosis present

## 2015-04-05 DIAGNOSIS — I252 Old myocardial infarction: Secondary | ICD-10-CM

## 2015-04-05 DIAGNOSIS — Z8249 Family history of ischemic heart disease and other diseases of the circulatory system: Secondary | ICD-10-CM

## 2015-04-05 DIAGNOSIS — R778 Other specified abnormalities of plasma proteins: Secondary | ICD-10-CM | POA: Insufficient documentation

## 2015-04-05 DIAGNOSIS — Z7902 Long term (current) use of antithrombotics/antiplatelets: Secondary | ICD-10-CM

## 2015-04-05 DIAGNOSIS — F17200 Nicotine dependence, unspecified, uncomplicated: Secondary | ICD-10-CM | POA: Diagnosis present

## 2015-04-05 DIAGNOSIS — R569 Unspecified convulsions: Secondary | ICD-10-CM | POA: Diagnosis present

## 2015-04-05 HISTORY — DX: Other psychoactive substance abuse, uncomplicated: F19.10

## 2015-04-05 LAB — HEPATIC FUNCTION PANEL
ALT: 52 U/L (ref 17–63)
AST: 38 U/L (ref 15–41)
Albumin: 4.1 g/dL (ref 3.5–5.0)
Alkaline Phosphatase: 113 U/L (ref 38–126)
BILIRUBIN DIRECT: 0.2 mg/dL (ref 0.1–0.5)
BILIRUBIN TOTAL: 0.7 mg/dL (ref 0.3–1.2)
Indirect Bilirubin: 0.5 mg/dL (ref 0.3–0.9)
Total Protein: 7.9 g/dL (ref 6.5–8.1)

## 2015-04-05 LAB — BASIC METABOLIC PANEL
Anion gap: 14 (ref 5–15)
BUN: 6 mg/dL (ref 6–20)
CHLORIDE: 103 mmol/L (ref 101–111)
CO2: 18 mmol/L — ABNORMAL LOW (ref 22–32)
CREATININE: 1.01 mg/dL (ref 0.61–1.24)
Calcium: 10 mg/dL (ref 8.9–10.3)
Glucose, Bld: 100 mg/dL — ABNORMAL HIGH (ref 65–99)
POTASSIUM: 3.9 mmol/L (ref 3.5–5.1)
SODIUM: 135 mmol/L (ref 135–145)

## 2015-04-05 LAB — URINALYSIS COMPLETE WITH MICROSCOPIC (ARMC ONLY)
BACTERIA UA: NONE SEEN
Bilirubin Urine: NEGATIVE
Glucose, UA: 50 mg/dL — AB
Ketones, ur: NEGATIVE mg/dL
Leukocytes, UA: NEGATIVE
Nitrite: NEGATIVE
PH: 5 (ref 5.0–8.0)
PROTEIN: 100 mg/dL — AB
SQUAMOUS EPITHELIAL / LPF: NONE SEEN
Specific Gravity, Urine: 1.016 (ref 1.005–1.030)

## 2015-04-05 LAB — URINE DRUG SCREEN, QUALITATIVE (ARMC ONLY)
Amphetamines, Ur Screen: NOT DETECTED
BARBITURATES, UR SCREEN: NOT DETECTED
Benzodiazepine, Ur Scrn: POSITIVE — AB
COCAINE METABOLITE, UR ~~LOC~~: NOT DETECTED
Cannabinoid 50 Ng, Ur ~~LOC~~: NOT DETECTED
MDMA (Ecstasy)Ur Screen: NOT DETECTED
METHADONE SCREEN, URINE: NOT DETECTED
OPIATE, UR SCREEN: NOT DETECTED
PHENCYCLIDINE (PCP) UR S: NOT DETECTED
Tricyclic, Ur Screen: POSITIVE — AB

## 2015-04-05 LAB — CBC
HEMATOCRIT: 48.8 % (ref 40.0–52.0)
Hemoglobin: 16.5 g/dL (ref 13.0–18.0)
MCH: 33.2 pg (ref 26.0–34.0)
MCHC: 33.9 g/dL (ref 32.0–36.0)
MCV: 98 fL (ref 80.0–100.0)
PLATELETS: 260 10*3/uL (ref 150–440)
RBC: 4.98 MIL/uL (ref 4.40–5.90)
RDW: 19.9 % — AB (ref 11.5–14.5)
WBC: 18.9 10*3/uL — AB (ref 3.8–10.6)

## 2015-04-05 LAB — TROPONIN I
TROPONIN I: 2.94 ng/mL — AB (ref <0.031)
Troponin I: 0.13 ng/mL — ABNORMAL HIGH (ref <0.031)

## 2015-04-05 MED ORDER — HYDROXYZINE HCL 25 MG PO TABS
25.0000 mg | ORAL_TABLET | Freq: Three times a day (TID) | ORAL | Status: DC | PRN
  Administered 2015-04-06 – 2015-04-07 (×2): 25 mg via ORAL
  Filled 2015-04-05: qty 6
  Filled 2015-04-05: qty 1

## 2015-04-05 MED ORDER — SIMVASTATIN 40 MG PO TABS
40.0000 mg | ORAL_TABLET | ORAL | Status: DC
  Administered 2015-04-06 – 2015-04-09 (×4): 40 mg via ORAL
  Filled 2015-04-05 (×4): qty 1

## 2015-04-05 MED ORDER — ENOXAPARIN SODIUM 40 MG/0.4ML ~~LOC~~ SOLN
40.0000 mg | SUBCUTANEOUS | Status: DC
  Administered 2015-04-05: 40 mg via SUBCUTANEOUS
  Filled 2015-04-05: qty 0.4

## 2015-04-05 MED ORDER — FOLIC ACID 1 MG PO TABS
1.0000 mg | ORAL_TABLET | Freq: Every day | ORAL | Status: DC
  Administered 2015-04-05 – 2015-04-10 (×5): 1 mg via ORAL
  Filled 2015-04-05 (×5): qty 1

## 2015-04-05 MED ORDER — MORPHINE SULFATE (PF) 4 MG/ML IV SOLN
4.0000 mg | Freq: Once | INTRAVENOUS | Status: AC
  Administered 2015-04-05: 4 mg via INTRAVENOUS
  Filled 2015-04-05: qty 1

## 2015-04-05 MED ORDER — LORAZEPAM 2 MG/ML IJ SOLN
2.0000 mg | Freq: Once | INTRAMUSCULAR | Status: AC
  Administered 2015-04-05: 2 mg via INTRAVENOUS
  Filled 2015-04-05: qty 1

## 2015-04-05 MED ORDER — NITROGLYCERIN 2 % TD OINT
TOPICAL_OINTMENT | TRANSDERMAL | Status: AC
  Administered 2015-04-05: 0.5 [in_us] via TOPICAL
  Filled 2015-04-05: qty 1

## 2015-04-05 MED ORDER — ASPIRIN 81 MG PO CHEW
324.0000 mg | CHEWABLE_TABLET | Freq: Once | ORAL | Status: AC
  Administered 2015-04-05: 324 mg via ORAL

## 2015-04-05 MED ORDER — LORAZEPAM 2 MG/ML IJ SOLN
0.0000 mg | Freq: Two times a day (BID) | INTRAMUSCULAR | Status: DC
  Filled 2015-04-05: qty 2

## 2015-04-05 MED ORDER — VITAMIN B-1 100 MG PO TABS
100.0000 mg | ORAL_TABLET | Freq: Every day | ORAL | Status: DC
  Filled 2015-04-05: qty 1

## 2015-04-05 MED ORDER — NITROGLYCERIN 0.4 MG SL SUBL: 0.4000 mg | SUBLINGUAL_TABLET | SUBLINGUAL | Status: DC | PRN

## 2015-04-05 MED ORDER — ENOXAPARIN SODIUM 100 MG/ML ~~LOC~~ SOLN
1.0000 mg/kg | Freq: Two times a day (BID) | SUBCUTANEOUS | Status: DC
Start: 2015-04-06 — End: 2015-04-09
  Administered 2015-04-06 – 2015-04-08 (×6): 90 mg via SUBCUTANEOUS
  Filled 2015-04-05 (×6): qty 1

## 2015-04-05 MED ORDER — PRASUGREL HCL 10 MG PO TABS: 10.0000 mg | ORAL_TABLET | Freq: Every day | ORAL | Status: DC

## 2015-04-05 MED ORDER — LOSARTAN POTASSIUM 50 MG PO TABS
25.0000 mg | ORAL_TABLET | Freq: Every day | ORAL | Status: DC
  Administered 2015-04-06 – 2015-04-10 (×4): 25 mg via ORAL
  Filled 2015-04-05 (×4): qty 1

## 2015-04-05 MED ORDER — VORTIOXETINE HBR 10 MG PO TABS
10.0000 mg | ORAL_TABLET | Freq: Every day | ORAL | Status: DC
  Administered 2015-04-06 – 2015-04-08 (×3): 10 mg via ORAL
  Filled 2015-04-05 (×6): qty 1

## 2015-04-05 MED ORDER — THIAMINE HCL 100 MG/ML IJ SOLN
100.0000 mg | Freq: Every day | INTRAMUSCULAR | Status: DC
  Administered 2015-04-05: 100 mg via INTRAVENOUS
  Filled 2015-04-05: qty 2

## 2015-04-05 MED ORDER — LORAZEPAM 2 MG PO TABS: 0.0000 mg | ORAL_TABLET | Freq: Four times a day (QID) | ORAL | Status: DC

## 2015-04-05 MED ORDER — ONDANSETRON HCL 4 MG PO TABS: 4.0000 mg | ORAL_TABLET | Freq: Four times a day (QID) | ORAL | Status: DC | PRN

## 2015-04-05 MED ORDER — ONDANSETRON HCL 4 MG/2ML IJ SOLN
4.0000 mg | Freq: Once | INTRAMUSCULAR | Status: AC
  Administered 2015-04-05: 4 mg via INTRAVENOUS
  Filled 2015-04-05: qty 2

## 2015-04-05 MED ORDER — VITAMIN B-1 100 MG PO TABS
100.0000 mg | ORAL_TABLET | Freq: Every day | ORAL | Status: DC
  Administered 2015-04-05 – 2015-04-10 (×5): 100 mg via ORAL
  Filled 2015-04-05 (×4): qty 1

## 2015-04-05 MED ORDER — LORAZEPAM 1 MG PO TABS: 1.0000 mg | ORAL_TABLET | Freq: Four times a day (QID) | ORAL | Status: DC | PRN

## 2015-04-05 MED ORDER — QUETIAPINE FUMARATE 100 MG PO TABS
200.0000 mg | ORAL_TABLET | Freq: Every day | ORAL | Status: DC
  Administered 2015-04-05 – 2015-04-06 (×2): 200 mg via ORAL
  Filled 2015-04-05 (×2): qty 2

## 2015-04-05 MED ORDER — LORAZEPAM 2 MG/ML IJ SOLN
0.0000 mg | Freq: Four times a day (QID) | INTRAMUSCULAR | Status: DC
  Administered 2015-04-05: 1 mg via INTRAVENOUS

## 2015-04-05 MED ORDER — NICOTINE 14 MG/24HR TD PT24
14.0000 mg | MEDICATED_PATCH | Freq: Once | TRANSDERMAL | Status: AC
  Administered 2015-04-05: 14 mg via TRANSDERMAL
  Filled 2015-04-05: qty 1

## 2015-04-05 MED ORDER — SODIUM CHLORIDE 0.9 % IV SOLN
INTRAVENOUS | Status: DC
  Administered 2015-04-05 – 2015-04-09 (×8): via INTRAVENOUS

## 2015-04-05 MED ORDER — VITAMIN B-1 100 MG PO TABS: 100.0000 mg | ORAL_TABLET | Freq: Every day | ORAL | Status: DC

## 2015-04-05 MED ORDER — TRAZODONE HCL 100 MG PO TABS
200.0000 mg | ORAL_TABLET | Freq: Every day | ORAL | Status: DC
  Administered 2015-04-05 – 2015-04-06 (×2): 200 mg via ORAL
  Filled 2015-04-05 (×2): qty 2

## 2015-04-05 MED ORDER — CLOPIDOGREL BISULFATE 75 MG PO TABS
75.0000 mg | ORAL_TABLET | Freq: Every day | ORAL | Status: DC
  Administered 2015-04-05 – 2015-04-10 (×5): 75 mg via ORAL
  Filled 2015-04-05 (×5): qty 1

## 2015-04-05 MED ORDER — ASPIRIN 81 MG PO CHEW
CHEWABLE_TABLET | ORAL | Status: AC
  Administered 2015-04-05: 324 mg via ORAL
  Filled 2015-04-05: qty 4

## 2015-04-05 MED ORDER — ADULT MULTIVITAMIN W/MINERALS CH
1.0000 | ORAL_TABLET | Freq: Every day | ORAL | Status: DC
  Administered 2015-04-05 – 2015-04-10 (×5): 1 via ORAL
  Filled 2015-04-05 (×4): qty 1

## 2015-04-05 MED ORDER — LORAZEPAM 2 MG PO TABS: 0.0000 mg | ORAL_TABLET | Freq: Two times a day (BID) | ORAL | Status: DC

## 2015-04-05 MED ORDER — OXYCODONE HCL 5 MG PO TABS
5.0000 mg | ORAL_TABLET | ORAL | Status: DC | PRN
  Administered 2015-04-05 – 2015-04-10 (×17): 5 mg via ORAL
  Filled 2015-04-05 (×17): qty 1

## 2015-04-05 MED ORDER — ACETAMINOPHEN 650 MG RE SUPP: 650.0000 mg | Freq: Four times a day (QID) | RECTAL | Status: DC | PRN

## 2015-04-05 MED ORDER — ONDANSETRON HCL 4 MG/2ML IJ SOLN
4.0000 mg | Freq: Four times a day (QID) | INTRAMUSCULAR | Status: DC | PRN
  Administered 2015-04-10: 4 mg via INTRAVENOUS
  Filled 2015-04-05: qty 2

## 2015-04-05 MED ORDER — ACETAMINOPHEN 325 MG PO TABS
650.0000 mg | ORAL_TABLET | Freq: Four times a day (QID) | ORAL | Status: DC | PRN
  Administered 2015-04-07 – 2015-04-08 (×2): 650 mg via ORAL
  Filled 2015-04-05 (×2): qty 2

## 2015-04-05 MED ORDER — NITROGLYCERIN 2 % TD OINT
0.5000 [in_us] | TOPICAL_OINTMENT | Freq: Once | TRANSDERMAL | Status: AC
  Administered 2015-04-05: 0.5 [in_us] via TOPICAL

## 2015-04-05 MED ORDER — METOPROLOL TARTRATE 25 MG PO TABS
12.5000 mg | ORAL_TABLET | Freq: Two times a day (BID) | ORAL | Status: DC
  Filled 2015-04-05: qty 1

## 2015-04-05 MED ORDER — SODIUM CHLORIDE 0.9 % IJ SOLN
3.0000 mL | Freq: Two times a day (BID) | INTRAMUSCULAR | Status: DC
  Administered 2015-04-05 – 2015-04-07 (×4): 3 mL via INTRAVENOUS

## 2015-04-05 MED ORDER — MORPHINE SULFATE (PF) 2 MG/ML IV SOLN
2.0000 mg | INTRAVENOUS | Status: DC | PRN
  Administered 2015-04-05 – 2015-04-10 (×17): 2 mg via INTRAVENOUS
  Filled 2015-04-05 (×18): qty 1

## 2015-04-05 MED ORDER — LORAZEPAM 2 MG/ML IJ SOLN: 1.0000 mg | Freq: Four times a day (QID) | INTRAMUSCULAR | Status: DC | PRN

## 2015-04-05 MED ORDER — METOPROLOL TARTRATE 25 MG PO TABS
25.0000 mg | ORAL_TABLET | Freq: Two times a day (BID) | ORAL | Status: DC
  Administered 2015-04-05 – 2015-04-10 (×9): 25 mg via ORAL
  Filled 2015-04-05 (×9): qty 1

## 2015-04-05 MED ORDER — LORAZEPAM 2 MG/ML IJ SOLN
2.0000 mg | INTRAMUSCULAR | Status: DC | PRN
  Administered 2015-04-05: 2 mg via INTRAVENOUS
  Administered 2015-04-06: 3 mg via INTRAVENOUS
  Administered 2015-04-06: 2 mg via INTRAVENOUS
  Administered 2015-04-06: 3 mg via INTRAVENOUS
  Administered 2015-04-06: 2 mg via INTRAVENOUS
  Administered 2015-04-06 (×3): 3 mg via INTRAVENOUS
  Filled 2015-04-05 (×2): qty 2
  Filled 2015-04-05 (×2): qty 1
  Filled 2015-04-05 (×2): qty 2
  Filled 2015-04-05: qty 1
  Filled 2015-04-05: qty 2

## 2015-04-05 MED ORDER — THIAMINE HCL 100 MG/ML IJ SOLN
100.0000 mg | Freq: Every day | INTRAMUSCULAR | Status: DC
  Administered 2015-04-06: 100 mg via INTRAVENOUS
  Filled 2015-04-05: qty 2

## 2015-04-05 MED ORDER — SIMVASTATIN 40 MG PO TABS: 40.0000 mg | ORAL_TABLET | Freq: Every evening | ORAL | Status: DC

## 2015-04-05 MED ORDER — NICOTINE 21 MG/24HR TD PT24
21.0000 mg | MEDICATED_PATCH | Freq: Every day | TRANSDERMAL | Status: DC
  Administered 2015-04-05 – 2015-04-08 (×4): 21 mg via TRANSDERMAL
  Filled 2015-04-05 (×5): qty 1

## 2015-04-05 MED ORDER — DIAZEPAM 5 MG/ML IJ SOLN
2.5000 mg | Freq: Once | INTRAMUSCULAR | Status: AC
Start: 2015-04-05 — End: 2015-04-05
  Administered 2015-04-05: 2.5 mg via INTRAVENOUS
  Filled 2015-04-05: qty 2

## 2015-04-05 NOTE — ED Provider Notes (Signed)
[facility] Emergency Department Provider Note  ____________________________________________  Time seen: Approximately 5:00 PM  I have reviewed the triage vital signs and the nursing notes.   HISTORY  Chief Complaint Seizures and Anxiety    HPI Larry French is a 54 y.o. male who reports she was at [facility] detoxing from alcohol for detox from benzos as well. He reports his psychiatrist gave him Xanax thanks after his brother died last 22-Jun-2022. Patient began drinking as well and was probably taking 1 mg of Xanax 8 times a day instead of the 40 was prescribed. He was at his friend's house and remembers leaving there and he woke up in his car some distance from his friend's house state trooper bending over him telling him he had a seizure. Patient feels very anxious and jittery. He denies any chest pain or tightness nausea or shortness of breath. He is not taking any of his medications except for the Seroquel.   Past Medical History  Diagnosis Date  . Alcohol abuse     Recovered x 15 months  . CA - skin cancer   . Mental disorder   . Headache(784.0)   . Anxiety   . Depression   . 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 Hypertension   . Hyperlipemia   . MI, old 2012  . PTSD (post-traumatic stress disorder)     Patient Active Problem List   Diagnosis Date Noted  . STEMI, RCA DES 05/28/10 06/01/2011  . 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 Tobacco abuse 05/29/2011  . History of ETOH abuse and Xanax abuse. In rehab pta 05/29/2011    Past Surgical History  Procedure Laterality Date  . Tonsillectomy and adenoidectomy    . Left heart cath Right 05/29/2011    Procedure: LEFT HEART CATH;  Surgeon: Leonie Man, MD;  Location: [facility] CATH LAB;  Service: Cardiovascular;  Laterality: Right;  . Left heart catheterization with coronary angiogram [name] 06/01/2011    Procedure: LEFT HEART CATHETERIZATION WITH CORONARY ANGIOGRAM;  Surgeon: Leonie Man, MD;   Location: [facility] CATH LAB;  Service: Cardiovascular;  Laterality: [name];  relook cath, possible PCI    Current Outpatient Rx  Name  Route  Sig  Dispense  Refill  . ALPRAZolam (XANAX) 0.5 MG tablet   Oral   Take 1 tablet (0.5 mg total) by mouth 3 (three) times daily as needed for sleep or anxiety.   15 tablet   0   . ALPRAZolam (XANAX) 1 MG tablet   Oral   Take 1 mg by mouth 4 (four) times daily.         . clopidogrel (PLAVIX) 75 MG tablet   Oral   Take 75 mg by mouth daily.         . hydrOXYzine (ATARAX/VISTARIL) 25 MG tablet   Oral   Take 1 tablet (25 mg total) by mouth 3 (three) times daily as needed for anxiety.   30 tablet   0   . hydrOXYzine (VISTARIL) 50 MG capsule   Oral   Take 50 mg by mouth 2 (two) times daily as needed for anxiety or itching.         . losartan (COZAAR) 50 MG tablet   Oral   Take 25 mg by mouth daily.         . metoprolol tartrate (LOPRESSOR) 12.5 mg TABS   Oral   Take 0.5 tablets (12.5 mg total) by mouth 2 (two) times daily.  30 tablet   0   . metoprolol tartrate (LOPRESSOR) 25 MG tablet   Oral   Take 25 mg by mouth 2 (two) times daily.         Marland Kitchen EXPIRED: nitroGLYCERIN (NITROSTAT) 0.4 MG SL tablet   Sublingual   Place 1 tablet (0.4 mg total) under the tongue every 5 (five) minutes as needed for chest pain.   100 tablet   3   . nitroGLYCERIN (NITROSTAT) 0.4 MG SL tablet   Sublingual   Place 0.4 mg under the tongue every 5 (five) minutes as needed for chest pain.         . prasugrel (EFFIENT) 10 MG TABS   Oral   Take 1 tablet (10 mg total) by mouth daily.   30 tablet   0   . QUEtiapine (SEROQUEL) 100 MG tablet   Oral   Take 200 mg by mouth at bedtime.          Marland Kitchen EXPIRED: simvastatin (ZOCOR) 40 MG tablet   Oral   Take 1 tablet (40 mg total) by mouth every evening.   15 tablet   0     Patient needs to schedule appointment before futur ...   . simvastatin (ZOCOR) 40 MG tablet   Oral   Take 40 mg by mouth every  morning.         . traZODone (DESYREL) 100 MG tablet   Oral   Take 200 mg by mouth at bedtime.         . Vortioxetine HBr (TRINTELLIX) 10 MG TABS   Oral   Take 10 mg by mouth daily.           Allergies Review of patient's allergies indicates no known allergies.  Family History  Problem Relation Age of Onset  . Coronary artery disease Maternal Grandfather 35    Died    Social History Social History  Substance Use Topics  . Smoking status: Current Every Day Smoker -- 1.50 packs/day for 30 years    Types: Cigarettes  . Smokeless tobacco: None  . Alcohol Use: Yes     Comment: this week    Review of Systems Constitutional: No fever/chills Eyes: No visual changes. ENT: No sore throat. Cardiovascular: Denies chest pain. Respiratory: Denies shortness of breath. Gastrointestinal: No abdominal pain.  No nausea, no vomiting.  No diarrhea.  No constipation. Genitourinary: Negative for dysuria. Musculoskeletal: Negative for back pain. Skin: Negative for rash. Neurological: Negative for headaches, focal weakness or numbness.  10-point ROS otherwise negative.  ____________________________________________   PHYSICAL EXAM:  VITAL SIGNS: ED Triage Vitals  Enc Vitals Group     BP 04/05/15 1526 123/94 mmHg     Pulse Rate 04/05/15 1526 113     Resp 04/05/15 1526 18     Temp 04/05/15 1526 97.6 F (36.4 C)     Temp Source 04/05/15 1526 Oral     SpO2 04/05/15 1526 96 %     Weight 04/05/15 1526 200 lb (90.719 kg)     Height 04/05/15 1526 5\' 9"  (1.753 m)     Head Cir --      Peak Flow --      Pain Score --      Pain Loc --      Pain Edu? --      Excl. in [place]? --     Constitutional: Alert and oriented. Well appearing and in no acute distress and anxious.. Eyes: Conjunctivae are normal. PERRL. EOMI. Head:  Atraumatic. Nose: No congestion/rhinnorhea. Mouth/Throat: Mucous membranes are moist.  Oropharynx non-erythematous. Neck: No stridor.  Cardiovascular: Slight  tachycardia, regular rhythm. Grossly normal heart sounds.  Good peripheral circulation. Respiratory: Normal respiratory effort.  No retractions. Lungs CTAB. Gastrointestinal: Soft and nontender. No distention. No abdominal bruits. No CVA tenderness. Musculoskeletal: No lower extremity tenderness nor edema.  No joint effusions. Neurologic:  Normal speech and language. No gross focal neurologic deficits are appreciated. No gait instability. Skin:  Skin is warm, dry and intact. No rash noted. Psychiatric: Mood and affect are normal. Speech and behavior are normal.  ____________________________________________   LABS (all labs ordered are listed, but only abnormal results are displayed)  Labs Reviewed  BASIC METABOLIC PANEL - Abnormal; Notable for the following:    CO2 18 (*)    Glucose, Bld 100 (*)    All other components within normal limits  CBC - Abnormal; Notable for the following:    WBC 18.9 (*)    RDW 19.9 (*)    All other components within normal limits  URINE DRUG SCREEN, QUALITATIVE ([facility] ONLY) - Abnormal; Notable for the following:    Tricyclic, Ur Screen POSITIVE (*)    Benzodiazepine, Ur Scrn POSITIVE (*)    All other components within normal limits  TROPONIN I - Abnormal; Notable for the following:    Troponin I 0.13 (*)    All other components within normal limits  URINALYSIS COMPLETEWITH MICROSCOPIC ([facility] ONLY) - Abnormal; Notable for the following:    Color, Urine YELLOW (*)    APPearance CLEAR (*)    Glucose, UA 50 (*)    Hgb urine dipstick 1+ (*)    Protein, ur 100 (*)    All other components within normal limits  HEPATIC FUNCTION PANEL   ____________________________________________  EKG  EKG read and interpreted by me shows sinus tach at a rate of 110 normal axis he has ST segment depression inferior laterally she is new from 03/25/2015 ____________ benzodiazepine withdrawal seizure and EKG changes positive  troponin.________________________________  RADIOLOGY  CT of the head in the chest read by radiology as no acute pathology ____________________________________________   PROCEDURES  At 7 PM patient begins complaining of low back pain he had no back pain previously still does not have any chest pain will admit the patient for  ____________________________________________   INITIAL IMPRESSION / [place] / ED COURSE  Pertinent labs & imaging results that were available during my care of the patient were reviewed by me and considered in my medical decision making (see chart for details).   ____________________________________________   FINAL CLINICAL IMPRESSION(S) / ED DIAGNOSES  Final diagnoses:  Abnormal EKG  Elevated troponin  Benzodiazepine withdrawal, with unspecified complication [facility])  Seizure ([place])      Nena Polio, MD 04/05/15 [phone]

## 2015-04-05 NOTE — ED Notes (Signed)
Report from [place], rn. Pt placed on cardiac monitor. Pt provided with sheet or covers. Pt states pain to low back 10/10. Fine tremors noted to hands, no sweating noted. Pt alert and oriented, denies nausea. Skin warm and dry. 2+ pedal pulses and radial pulses noted. Pt denies shob, cp. resps unlabored. hospitalist in to assess pt. Explanation of admission procedure explained to pt who verbalizes understanding. Pt states low back pain increases with movement.

## 2015-04-05 NOTE — ED Notes (Addendum)
Pt states he had a seizure today on his way back to [facility], denies any hx of seizures, states he does not know how the police got to him, states he did not wreck his car,denies any bowel or bladder loss, states he did not bite his tongue, awake and alert upon arrival, pt states he is under a lot of stress, he is currently detoxing from  Benzos, pt very anxious during triage

## 2015-04-05 NOTE — ED Notes (Signed)
Radiology at bedside with pt.

## 2015-04-05 NOTE — H&P (Signed)
[place] [facility] [place] NAME: Larry French    MR#:  [id number]  DATE OF BIRTH:  [birth year]  DATE OF ADMISSION:  04/05/2015  PRIMARY CARE PHYSICIAN: Pcp Not In System   REQUESTING/REFERRING PHYSICIAN: Corinna Capra M.D.  CHIEF COMPLAINT:   Chief Complaint  Patient presents with  . Seizures  . Anxiety    HISTORY OF PRESENT ILLNESS: Larry French  is a 54 y.o. male with a known history of  alcohol abuse, and Xanax abuse who actually was seen in the emergency room on 12/05. And again on 12/13 with anxiety complaints. He had reported that he stopped taking Xanax last Tuesday. He also stopped drinking at the same time. Prior to that he states that he was drinking very heavily and abusing Xanax. [facility]. He reports that they are he was given Ativan taper dose but was not getting enough. Today he apparently was driving and next thing he reports that he noticed the officer trying to wake him up. Apparently he was having a seizure. Therefore were being asked to admit the patient. He is currently awake and oriented appears a little anxious. PAST MEDICAL HISTORY:   Past Medical History  Diagnosis Date  . Alcohol abuse     Recovered x 15 months  . CA - skin cancer   . Mental disorder   . Headache(784.0)   . Anxiety   . Depression   . 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 Hypertension   . Hyperlipemia   . MI, old 2012  . PTSD (post-traumatic stress disorder)     PAST SURGICAL HISTORY:  Past Surgical History  Procedure Laterality Date  . Tonsillectomy and adenoidectomy    . Left heart cath Right 05/29/2011    Procedure: LEFT HEART CATH;  Surgeon: Leonie Man, MD;  Location: [facility] CATH LAB;  Service: Cardiovascular;  Laterality: Right;  . Left heart catheterization with coronary angiogram [name] 06/01/2011    Procedure: LEFT HEART CATHETERIZATION WITH CORONARY ANGIOGRAM;  Surgeon: Leonie Man, MD;  Location: [facility] CATH LAB;  Service: Cardiovascular;  Laterality: [name];  relook cath, possible PCI    SOCIAL HISTORY:  Social History  Substance Use Topics  . Smoking status: Current Every Day Smoker -- 1.50 packs/day for 30 years    Types: Cigarettes  . Smokeless tobacco: Not on file  . Alcohol Use: Yes     Comment: this week    FAMILY HISTORY:  Family History  Problem Relation Age of Onset  . Coronary artery disease Maternal Grandfather 35    Died    DRUG ALLERGIES: No Known Allergies  REVIEW OF SYSTEMS:   CONSTITUTIONAL: No fever, fatigue or weakness.  EYES: No blurred or double vision.  EARS, NOSE, AND THROAT: No tinnitus or ear pain.  RESPIRATORY: No cough, shortness of breath, wheezing or hemoptysis.  CARDIOVASCULAR: No chest pain, orthopnea, edema.  GASTROINTESTINAL: No nausea, vomiting, diarrhea or abdominal pain.  GENITOURINARY: No dysuria, hematuria.  ENDOCRINE: No polyuria, nocturia,  HEMATOLOGY: No anemia, easy bruising or bleeding SKIN: No rash or lesion. MUSCULOSKELETAL: No joint pain or arthritis.   NEUROLOGIC: No tingling, numbness, weakness.  PSYCHIATRY: c/o anxiety and depression.   MEDICATIONS AT HOME:  Prior to Admission medications   Medication Sig Start Date End Date Taking? Authorizing Provider  ALPRAZolam Duanne Moron) 0.5 MG tablet Take 1 tablet (0.5 mg total) by mouth 3 (three) times daily as  needed for sleep or anxiety. 01/28/15 01/28/16  Lavonia Drafts, MD  ALPRAZolam Duanne Moron) 1 MG tablet Take 1 mg by mouth 4 (four) times daily.    Historical Provider, MD  clopidogrel (PLAVIX) 75 MG tablet Take 75 mg by mouth daily.    Historical Provider, MD  hydrOXYzine (ATARAX/VISTARIL) 25 MG tablet Take 1 tablet (25 mg total) by mouth 3 (three) times daily as needed for anxiety. 03/24/15   Carrie Mew, MD  hydrOXYzine (VISTARIL) 50 MG capsule Take 50 mg by mouth 2 (two) times daily as needed for anxiety or itching.    Historical Provider, MD  losartan  (COZAAR) 50 MG tablet Take 25 mg by mouth daily.    Historical Provider, MD  metoprolol tartrate (LOPRESSOR) 12.5 mg TABS Take 0.5 tablets (12.5 mg total) by mouth 2 (two) times daily. 09/22/12   Leonie Man, MD  metoprolol tartrate (LOPRESSOR) 25 MG tablet Take 25 mg by mouth 2 (two) times daily.    Historical Provider, MD  nitroGLYCERIN (NITROSTAT) 0.4 MG SL tablet Place 1 tablet (0.4 mg total) under the tongue every 5 (five) minutes as needed for chest pain. 06/02/11 06/01/12  Erlene Quan, PA-C  nitroGLYCERIN (NITROSTAT) 0.4 MG SL tablet Place 0.4 mg under the tongue every 5 (five) minutes as needed for chest pain.    Historical Provider, MD  prasugrel (EFFIENT) 10 MG TABS Take 1 tablet (10 mg total) by mouth daily. 06/02/11   Erlene Quan, PA-C  QUEtiapine (SEROQUEL) 100 MG tablet Take 200 mg by mouth at bedtime.     Historical Provider, MD  simvastatin (ZOCOR) 40 MG tablet Take 1 tablet (40 mg total) by mouth every evening. 01/03/13 01/03/14  Leonie Man, MD  simvastatin (ZOCOR) 40 MG tablet Take 40 mg by mouth every morning.    Historical Provider, MD  traZODone (DESYREL) 100 MG tablet Take 200 mg by mouth at bedtime.    Historical Provider, MD  Vortioxetine HBr (TRINTELLIX) 10 MG TABS Take 10 mg by mouth daily.    Historical Provider, MD      PHYSICAL EXAMINATION:   VITAL SIGNS: Blood pressure 125/85, pulse 86, temperature 97.6 F (36.4 C), temperature source Oral, resp. rate 18, height 5\' 9"  (1.753 m), weight 90.719 kg (200 lb), SpO2 96 %.  GENERAL:  54 y.o.-year-old patient lying in the bed with no acute distress.  EYES: Pupils equal, round, reactive to light and accommodation. No scleral icterus. Extraocular muscles intact.  HEENT: Head atraumatic, normocephalic. Oropharynx and nasopharynx clear.  NECK:  Supple, no jugular venous distention. No thyroid enlargement, no tenderness.  LUNGS: Normal breath sounds bilaterally, no wheezing, rales,rhonchi or crepitation. No use of  accessory muscles of respiration.  CARDIOVASCULAR: S1, S2 normal. No murmurs, rubs, or gallops.  ABDOMEN: Soft, nontender, nondistended. Bowel sounds present. No organomegaly or mass.  EXTREMITIES: No pedal edema, cyanosis, or clubbing.  NEUROLOGIC: Cranial nerves II through XII are intact. Muscle strength 5/5 in all extremities. Sensation intact. Gait not checked.  PSYCHIATRIC: The patient is alert and oriented x 3. Appears flow anxious SKIN: No obvious rash, lesion, or ulcer.   LABORATORY PANEL:   CBC  Recent Labs Lab 04/05/15 1531  WBC 18.9*  HGB 16.5  HCT 48.8  PLT 260  MCV 98.0  MCH 33.2  MCHC 33.9  RDW 19.9*   ------------------------------------------------------------------------------------------------------------------  Chemistries   Recent Labs Lab 04/05/15 1531  NA 135  K 3.9  CL 103  CO2 18*  GLUCOSE 100*  BUN 6  CREATININE 1.01  CALCIUM 10.0  AST 38  ALT 52  ALKPHOS 113  BILITOT 0.7   ------------------------------------------------------------------------------------------------------------------ estimated creatinine clearance is 94.2 mL/min (by C-G formula based on Cr of 1.01). ------------------------------------------------------------------------------------------------------------------ No results for input(s): TSH, T4TOTAL, T3FREE, THYROIDAB in the last 72 hours.  Invalid input(s): FREET3   Coagulation profile No results for input(s): INR, PROTIME in the last 168 hours. ------------------------------------------------------------------------------------------------------------------- No results for input(s): DDIMER in the last 72 hours. -------------------------------------------------------------------------------------------------------------------  Cardiac Enzymes  Recent Labs Lab 04/05/15 1531  TROPONINI 0.13*    ------------------------------------------------------------------------------------------------------------------ Invalid input(s): POCBNP  ---------------------------------------------------------------------------------------------------------------  Urinalysis    Component Value Date/Time   COLORURINE YELLOW* 04/05/2015 1531   COLORURINE Straw 03/05/2014 1542   APPEARANCEUR CLEAR* 04/05/2015 1531   APPEARANCEUR Clear 03/05/2014 1542   LABSPEC 1.016 04/05/2015 1531   LABSPEC 1.005 03/05/2014 1542   PHURINE 5.0 04/05/2015 1531   PHURINE 6.0 03/05/2014 1542   GLUCOSEU 50* 04/05/2015 1531   GLUCOSEU Negative 03/05/2014 1542   HGBUR 1+* 04/05/2015 1531   HGBUR Negative 03/05/2014 1542   BILIRUBINUR NEGATIVE 04/05/2015 1531   BILIRUBINUR Negative 03/05/2014 1542   [place] 04/05/2015 1531   KETONESUR Negative 03/05/2014 1542   PROTEINUR 100* 04/05/2015 1531   PROTEINUR Negative 03/05/2014 1542   NITRITE NEGATIVE 04/05/2015 1531   NITRITE Negative 03/05/2014 1542   LEUKOCYTESUR NEGATIVE 04/05/2015 1531   LEUKOCYTESUR Negative 03/05/2014 1542     RADIOLOGY: Dg Lumbar Spine Complete  04/05/2015  CLINICAL DATA:  Low back pain today, no known injury EXAM: LUMBAR SPINE - COMPLETE 4+ VIEW COMPARISON:  None FINDINGS: Osseous demineralization. Five non-rib-bearing lumbar vertebra. Multilevel disc space narrowing and endplate spur formation greatest at L4-L5. Superior endplate of compression fracture of L1 vertebral body, appears acute, with approximately 30% anterior height loss. No additional fracture, subluxation or bone destruction. No spondylolysis. SI joints symmetric. IMPRESSION: Acute appearing superior endplate compression fracture of L1 vertebral body with approximately 30% anterior height loss. Osseous demineralization with multilevel degenerative disc disease changes lumbar spine. Electronically Signed   By: Lavonia Dana M.D.   On: 04/05/2015 20:04   Ct Head Wo  Contrast  04/05/2015  CLINICAL DATA:  New onset seizure today. EXAM: CT HEAD WITHOUT CONTRAST TECHNIQUE: Contiguous axial images were obtained from the base of the skull through the vertex without intravenous contrast. COMPARISON:  Head CT 11/24/2013 FINDINGS: No intracranial hemorrhage, mass effect, or midline shift. Chronic small vessel ischemia and remote lacunar infarcts in the left basal ganglia, unchanged. No hydrocephalus. The basilar cisterns are patent. No evidence of territorial infarct. No intracranial fluid collection. Atherosclerosis noted of skullbase vasculature, advanced for age. Calvarium is intact. Sclerotic density in the right parietal bone, unchanged from prior. Included paranasal sinuses and mastoid air cells are well aerated. 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 No CT findings of acute intracranial abnormality. Electronically Signed   By: Jeb Levering M.D.   On: 04/05/2015 18:25   Dg Chest Portable 1 View  04/05/2015  CLINICAL DATA:  Seizure today, shortness of breath, EKG changes, coronary artery disease post MI, hypertension, smoker EXAM: PORTABLE CHEST 1 VIEW COMPARISON:  Portable exam 1715 hours compared to 03/07/2014 FINDINGS: Upper normal heart size. Mediastinal contours and pulmonary vascularity normal. Lungs clear. No pleural effusion or pneumothorax. No acute osseous findings. IMPRESSION: No acute abnormalities. Electronically Signed   By: Lavonia Dana M.D.   On: 04/05/2015 17:47    EKG: Orders placed  or performed during the hospital encounter of 04/05/15  . EKG 12-Lead  . EKG 12-Lead  . ED EKG  . ED EKG  . ED EKG  . ED EKG    IMPRESSION AND PLAN: Patient is a 54 year old white male this is his third visit to the ER. With complaint of anxiety and withdrawal. He possibly had withdrawal seizure  1. Withdrawal seizure possibly related to benzodiazepines as well as alcohol. At this time I'll place him on  Cipro protocol. We will have psychiatry input regarding his benzo abuse.  2. Coronary artery disease continue Plavix, and metoprolol  3. Hypertension continue continue losartan and metoprolol  4. Nicotine abuse pink cessation provided for minutes spent strongly recommended patient   stop smoking nicotine patch will be provided.  All the records are reviewed and case discussed with ED provider. Management plans discussed with the patient, family and they are in agreement.  CODE STATUS:    TOTAL TIME TAKING CARE OF THIS PATIENT:68minutes.    Dustin Flock M.D on 04/05/2015 at 8:17 PM  Between 7am to 6pm - Pager - [phone]  After 6pm go to [email or web address] - password [email or web address] [facility]  Office  [phone]  CC: Primary care physician; Pcp Not In System

## 2015-04-05 NOTE — ED Notes (Signed)
MD informed of lower back pain again and that pt is pacing around room reporting this is the only way to relieve the pain.

## 2015-04-05 NOTE — ED Notes (Signed)
Pt continues to report lower back pain. MD made aware.

## 2015-04-05 NOTE — ED Notes (Signed)
Lab called by this RN and informed of added Hepatic function blood test.

## 2015-04-06 DIAGNOSIS — F13239 Sedative, hypnotic or anxiolytic dependence with withdrawal, unspecified: Secondary | ICD-10-CM

## 2015-04-06 LAB — BASIC METABOLIC PANEL
ANION GAP: 8 (ref 5–15)
BUN: 5 mg/dL — AB (ref 6–20)
CHLORIDE: 107 mmol/L (ref 101–111)
CO2: 25 mmol/L (ref 22–32)
Calcium: 8.9 mg/dL (ref 8.9–10.3)
Creatinine, Ser: 0.83 mg/dL (ref 0.61–1.24)
GFR calc Af Amer: >60 mL/min (ref >60)
Glucose, Bld: 103 mg/dL — ABNORMAL HIGH (ref 65–99)
POTASSIUM: 3.5 mmol/L (ref 3.5–5.1)
SODIUM: 140 mmol/L (ref 135–145)

## 2015-04-06 LAB — CBC
HCT: 43.2 % (ref 40.0–52.0)
HEMOGLOBIN: 14.4 g/dL (ref 13.0–18.0)
MCH: 32.8 pg (ref 26.0–34.0)
MCHC: 33.3 g/dL (ref 32.0–36.0)
MCV: 98.7 fL (ref 80.0–100.0)
PLATELETS: 208 10*3/uL (ref 150–440)
RBC: 4.38 MIL/uL — AB (ref 4.40–5.90)
RDW: 20.3 % — ABNORMAL HIGH (ref 11.5–14.5)
WBC: 11.9 10*3/uL — AB (ref 3.8–10.6)

## 2015-04-06 LAB — MRSA PCR SCREENING: MRSA by PCR: NEGATIVE

## 2015-04-06 LAB — TROPONIN I
TROPONIN I: 3.38 ng/mL — AB (ref <0.031)
Troponin I: 2.84 ng/mL — ABNORMAL HIGH (ref <0.031)

## 2015-04-06 MED ORDER — LORAZEPAM 0.5 MG PO TABS
1.0000 mg | ORAL_TABLET | Freq: Four times a day (QID) | ORAL | Status: AC | PRN
  Administered 2015-04-08 – 2015-04-09 (×6): 1 mg via ORAL
  Filled 2015-04-06 (×3): qty 1
  Filled 2015-04-06: qty 2
  Filled 2015-04-06 (×3): qty 1

## 2015-04-06 MED ORDER — LORAZEPAM 1 MG PO TABS
0.0000 mg | ORAL_TABLET | Freq: Two times a day (BID) | ORAL | Status: DC
Start: 2015-04-08 — End: 2015-04-07

## 2015-04-06 MED ORDER — LORAZEPAM 1 MG PO TABS
0.0000 mg | ORAL_TABLET | Freq: Four times a day (QID) | ORAL | Status: DC
  Administered 2015-04-06: 1 mg via ORAL
  Administered 2015-04-07 (×2): 2 mg via ORAL
  Filled 2015-04-06 (×2): qty 2

## 2015-04-06 MED ORDER — LORAZEPAM 2 MG/ML IJ SOLN
1.0000 mg | Freq: Four times a day (QID) | INTRAMUSCULAR | Status: AC | PRN
  Administered 2015-04-07 – 2015-04-08 (×4): 1 mg via INTRAVENOUS
  Filled 2015-04-06 (×5): qty 1

## 2015-04-06 NOTE — Progress Notes (Signed)
Larry French is a 54 y.o. male patient admitted from ED awake, alert - oriented  X 4 - no acute distress noted.  VSS - Blood pressure 116/82, pulse 90, temperature 98.2 F (36.8 C), temperature source Oral, resp. rate 16, height 5\' 9"  (1.753 m), weight 90.719 kg (200 lb), SpO2 94 %.    IV in place, occlusive dsg intact without redness. NS started at 5mL/h per MD order.  Orientation to room, and floor completed with information packet given to patient/family. Admission INP armband ID verified with patient/family, and in place.   SR up x 2, fall assessment complete, with patient and family able to verbalize understanding of risk associated with falls, and verbalized understanding to call nsg before up out of bed.  Call light within reach, patient able to voice, and demonstrate understanding.  Skin, clean-dry- intact without evidence of bruising, or skin tears.   No evidence of skin break down noted on exam. Skin assessed with Vincente Liberty, RN.    Pt complaining of anxiety and back pain. Will cont to eval and treat per MD orders.  Rachael Fee, RN

## 2015-04-06 NOTE — Progress Notes (Signed)
Pt found smoking in b.r. Pt was informed of no smoking hospital policy as well as actual danger with 02 present and the fact that he is wearing nicoderm patch. Pt gave me the cigarettes, as well as his lighter. . These were placed in med room in his med drawer.

## 2015-04-06 NOTE — Progress Notes (Signed)
Pt's troponin elevated to 2.94, MD Dr. Lavetta Nielsen notified. Orders given for lovenox 90mg  q 12h.

## 2015-04-06 NOTE — Progress Notes (Signed)
[place] [facility] [place] NAME: Larry French    MR#:  [id number]  DATE OF BIRTH:  December 26, 1960  SUBJECTIVE:  CHIEF COMPLAINT:   Chief Complaint  Patient presents with  . Seizures  . Anxiety   Patient here due to a seizure episode secondary from alcohol/benzodiazepine withdrawal.  No further seizures overnight and clinically stable presently.  REVIEW OF SYSTEMS:    Review of Systems  Constitutional: Negative for fever and chills.  HENT: Negative for congestion and tinnitus.   Eyes: Negative for blurred vision and double vision.  Respiratory: Negative for cough, shortness of breath and wheezing.   Cardiovascular: Negative for chest pain, orthopnea and PND.  Gastrointestinal: Negative for nausea, vomiting, abdominal pain and diarrhea.  Genitourinary: Negative for dysuria and hematuria.  Neurological: Negative for dizziness, sensory change and focal weakness.  Psychiatric/Behavioral: The patient is nervous/anxious.   All other systems reviewed and are negative.   Nutrition: Regular Tolerating Diet: Yes Tolerating PT: Ambulatory  DRUG ALLERGIES:  No Known Allergies  VITALS:  Blood pressure 134/75, pulse 68, temperature 99.2 F (37.3 C), temperature source Oral, resp. rate 20, height 5\' 9"  (1.753 m), weight 90.719 kg (200 lb), SpO2 90 %.  PHYSICAL EXAMINATION:   Physical Exam  GENERAL:  54 y.o.-year-old patient lying in the bed with no acute distress.  EYES: Pupils equal, round, reactive to light and accommodation. No scleral icterus. Extraocular muscles intact.  HEENT: Head atraumatic, normocephalic. Oropharynx and nasopharynx clear.  NECK:  Supple, no jugular venous distention. No thyroid enlargement, no tenderness.  LUNGS: Normal breath sounds bilaterally, no wheezing, rales, rhonchi. No use of accessory muscles of respiration.  CARDIOVASCULAR: S1, S2 normal. No murmurs, rubs, or gallops.  ABDOMEN: Soft, nontender,  nondistended. Bowel sounds present. No organomegaly or mass.  EXTREMITIES: No cyanosis, clubbing or edema b/l.    NEUROLOGIC: Cranial nerves II through XII are intact. No focal Motor or sensory deficits b/l.   PSYCHIATRIC: The patient is alert and oriented x 3. Anxious SKIN: No obvious rash, lesion, or ulcer.    LABORATORY PANEL:   CBC  Recent Labs Lab 04/06/15 0348  WBC 11.9*  HGB 14.4  HCT 43.2  PLT 208   ------------------------------------------------------------------------------------------------------------------  Chemistries   Recent Labs Lab 04/05/15 1531 04/06/15 0348  NA 135 140  K 3.9 3.5  CL 103 107  CO2 18* 25  GLUCOSE 100* 103*  BUN 6 5*  CREATININE 1.01 0.83  CALCIUM 10.0 8.9  AST 38  --   ALT 52  --   ALKPHOS 113  --   BILITOT 0.7  --    ------------------------------------------------------------------------------------------------------------------  Cardiac Enzymes  Recent Labs Lab 04/06/15 0836  TROPONINI 2.84*   ------------------------------------------------------------------------------------------------------------------  RADIOLOGY:  Dg Lumbar Spine Complete  04/05/2015  CLINICAL DATA:  Low back pain today, no known injury EXAM: LUMBAR SPINE - COMPLETE 4+ VIEW COMPARISON:  None FINDINGS: Osseous demineralization. Five non-rib-bearing lumbar vertebra. Multilevel disc space narrowing and endplate spur formation greatest at L4-L5. Superior endplate of compression fracture of L1 vertebral body, appears acute, with approximately 30% anterior height loss. No additional fracture, subluxation or bone destruction. No spondylolysis. SI joints symmetric. IMPRESSION: Acute appearing superior endplate compression fracture of L1 vertebral body with approximately 30% anterior height loss. Osseous demineralization with multilevel degenerative disc disease changes lumbar spine. Electronically Signed   By: Lavonia Dana M.D.   On: 04/05/2015 20:04   Ct  Head Wo Contrast  04/05/2015  CLINICAL  DATA:  New onset seizure today. EXAM: CT HEAD WITHOUT CONTRAST TECHNIQUE: Contiguous axial images were obtained from the base of the skull through the vertex without intravenous contrast. COMPARISON:  Head CT 11/24/2013 FINDINGS: No intracranial hemorrhage, mass effect, or midline shift. Chronic small vessel ischemia and remote lacunar infarcts in the left basal ganglia, unchanged. No hydrocephalus. The basilar cisterns are patent. No evidence of territorial infarct. No intracranial fluid collection. Atherosclerosis noted of skullbase vasculature, advanced for age. Calvarium is intact. Sclerotic density in the right parietal bone, unchanged from prior. Included paranasal sinuses and mastoid air cells are well aerated. 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 No CT findings of acute intracranial abnormality. Electronically Signed   By: Jeb Levering M.D.   On: 04/05/2015 18:25   Dg Chest Portable 1 View  04/05/2015  CLINICAL DATA:  Seizure today, shortness of breath, EKG changes, coronary artery disease post MI, hypertension, smoker EXAM: PORTABLE CHEST 1 VIEW COMPARISON:  Portable exam 1715 hours compared to 03/07/2014 FINDINGS: Upper normal heart size. Mediastinal contours and pulmonary vascularity normal. Lungs clear. No pleural effusion or pneumothorax. No acute osseous findings. IMPRESSION: No acute abnormalities. Electronically Signed   By: Lavonia Dana M.D.   On: 04/05/2015 17:47     ASSESSMENT AND PLAN:   54 year old male with past medical history of anxiety, alcohol abuse, hx of coronary artery disease, PTSD, hypertension, hyperlipidemia, depression who presented to the hospital due to seizures.  #1 seizures-this was likely due to alcohol withdrawal/withdrawal from benzodiazepines. -Patient apparently was getting detox for benzo abuse and left early from [facility] and was drinking to help with his  anxiety.  He stopped ETOH abuse 2 days ago.  - no further evidence of seizures overnight.   - cont. CIWA, Ativan PRN.   #2 alcohol abuse/withdrawal-continue CIWA protocol. -Continue thiamine, folate.  #3 elevated troponin-patient has no acute chest pain. History parents have trended upwards. -I will get a cardiology consult, check a two-dimensional echocardiogram. -Patient has a history of coronary artery disease with a cardiac catheterization done in 2013. -Continue aspirin, Plavix, Lovenox, beta blocker, losartan, simvastatin  #4 hyperlipidemia-continue simvastatin.  #5 tobacco abuse-continue nicotine patch.  #6 hypertension-continue metoprolol, losartan.  #7 anxiety/benzodiazepine withdrawal-continue Ativan as needed. Await psychiatric consult for further management of his anxiety and benzodiazepine abuse.   All the records are reviewed and case discussed with Care Management/Social Workerr. Management plans discussed with the patient, family and they are in agreement.  CODE STATUS: Full  DVT Prophylaxis: Lovenox   TOTAL TIME TAKING CARE OF THIS PATIENT: 30 minutes.   POSSIBLE D/C IN 1-2 DAYS, DEPENDING ON CLINICAL CONDITION.   Henreitta Leber M.D on 04/06/2015 at 2:56 PM  Between 7am to 6pm - Pager - [phone]  After 6pm go to [email or web address] - password [email or web address] [facility]  Office  [phone]  CC: Primary care physician; Pcp Not In System

## 2015-04-06 NOTE — Consult Note (Signed)
[facility] Face-to-Face Psychiatry Consult   Reason for Consult:  Benzodiazepine and alcohol withdrawal with seizures Referring Physician:  Dustin Flock, MD  Patient Identification: Larry French MRN:  [id number]   Principal Diagnosis: Withdrawal from benzodiazepine [facility])   Diagnosis:   Patient Active Problem List   Diagnosis Date Noted  . Withdrawal from benzodiazepine ([place]) [F13.239] 04/05/2015  . STEMI, RCA DES 05/28/10 [I21.3] 06/01/2011  . CAD, residual CFX LAD disease [I25.10] 06/01/2011  . Tobacco abuse [Z72.0] 05/29/2011  . History of ETOH abuse and Xanax abuse. In rehab pta [F10.10] 05/29/2011    Total Time spent with patient: 45 minutes  Subjective:   Larry French is a 54 y.o. male patient admitted with benzodiazepine and alcohol withdrawal with seizures. Per ED Physician notes, the pt received Xanax from his psychiatrist after the death of his brother in 07-21-14. Pt began drinking and taking Xanax '1mg'$  po 8 times per day instead of '4mg'$  po QD.  He was at his friend's home, he left there & woke up in his car some distance from his friend's home. There was a state trooper bending over him telling him he had a seizure.   Per admission Internal Medicine H&P, the pt was seen in the ED on 12-05 and 12-13 for anxiety. He stopped taking Xanax and consuming alcohol last Tuesday (04-01-15).  Prior to that, he was drinking heavily and abusing Xanax. He went to [facility] for detox where he was prescribed a lorazepam taper dose. Today he was driving and recalls being awakened by an officer as the pt was having a seizure.   Nursing notes reviewed. Pt denies a h/o seizures.  Pt is anxious in triage. Pt placed on cardiac monitor with fine tremor noted in hands. Diaphoresis not observed.   Today on interview, the pt is lying in bed. He speaks with this provider readily but he is irritable and agitated. He reviewed his reasons for admission which are noted above. Larry French shared he has been  taking Xanax for four years. His most recent dosage is '1mg'$  po QID. Due to increased stress, the pt has been taking upwards of '6mg'$  po QD since the late summer. Around the same time (or starting in November 2016 - the pt reports both dates), he began to consume more alcohol. He was drinking two 40 ounce beers in the morning and two in the evening. He started to consume alcohol because Xanax was no providing the same relief. He also noted he felt happier drinking alcohol.   He last used Xanax on 12-6 or 03-27-2015. The pt decided at that time he was no longer going to use Xanax. He went to [facility] for treatment and was to stay there through 04-07-15. He was tapered to discontinuation at [facility] with lorazepam. He left [facility] on 04-05-2015 because his last dose of lorazepam was scheduled for 3pm. He left late morning before lunch. He drove home then decided due to withdrawals, he would purchase a beer. When on his way to get a beer, he had seizure when stopped at an intersection around 2-3pm he believes.   He notes he was craving Xanax yesterday but when he first left [facility], he denies using drugs or alcohol. His plan after leaving [facility] was to follow-up with [facility] on Monday 04-07-15 at 9am.   He never had a time when he ran out of Xanax early as there was another supply in his home. When he did not take Xanax on time, he  experienced tremulousness. He continues to crave Xanax but denies losing relationships, properties, employment from Xanax use.   Pt denies cravings for alcohol. He is not able to stay at his parents' home at this time because he is using alcohol. His younger brother died in 07/17/2014 due to a MVA due to alcohol intoxication.   For the past 2 weeks, the pt endorses decreased sleep, guilt, decreased energy, decreased concentration and psychomotor retardation. The pt relates this to Xanax use. He denies symptoms consistent with mania. He endorses periodic hopelessness, helplessness and worthless due to making  poor choices. He endorses fleeting SI, last occuring today, without a plan. He denies HI and AVH.     Past Psychiatric History: Dx: PTSD related to his attempted murder in July 17, 2003; GAD, Social anxiety and depression Psychiatrist: [facility] but pt wants to follow-up with [facility].  Therapist: Endorses at [place]: Denies ECT: Denies Suicide attempt/Self-harm: Denies Homicide attempts/harming others: Denies Previous Medication Trials: Seroquel, Xanax and Trazodone. Pt denies having tried SSRIs or SNRIs for anxiety.    Risk to Self:   Risk to Others:   Prior Inpatient Therapy:   Prior Outpatient Therapy:    Past Medical History:  Past Medical History  Diagnosis Date  . Alcohol abuse     Recovered x 15 months  . CA - skin cancer   . Mental disorder   . Headache(784.0)   . Anxiety   . Depression   . 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 Hypertension   . Hyperlipemia   . 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 PTSD (post-traumatic stress disorder)     Past Surgical History  Procedure Laterality Date  . Tonsillectomy and adenoidectomy    . Left heart cath Right 05/29/2011    Procedure: LEFT HEART CATH;  Surgeon: Leonie Man, MD;  Location: [facility] C[facility] LAB;  Service: Cardiovascular;  Laterality: Right;  . Left heart catheterization with coronary angiogram [name] 06/01/2011    Procedure: LEFT HEART CATHETERIZATION WITH CORONARY ANGIOGRAM;  Surgeon: Leonie Man, MD;  Location[facility] C[facility] LAB;  Service: Cardiovascular;  Laterality: [name];  relook cath, possible PCI   Family History:  Family History  Problem Relation Age of Onset  . Coronary artery disease Maternal Grandfather 19    Died   Family Psychiatric History:  Depression: parents Anxiety: parents Bipolar disorder: Father Schizophrenia: "there is no diagnosis" Panic disorder: parents  Substance abuse: Mother, uses Xanax Alcohol: Brother  Suicide: Denies   Social History:  History  Alcohol Use  . Yes    Comment: this week      History  Drug Use No    Social History   Social History  . Marital Status: Single    Spouse Name: [name]  . Number of Children: 0  . Years of Education: [name]   Occupational History  . Student    Social History Main Topics  . Smoking status: Current Every Day Smoker -- 1.50 packs/day for 30 years    Types: Cigarettes  . Smokeless tobacco: None  . Alcohol Use: Yes     Comment: this week  . Drug Use: No  . Sexual Activity: No   Other Topics Concern  . None   Social History Narrative   Additional Social History: Has 2 degrees.  Lives with his parents.  1 deceased brother, no sisters.  Patient is homosexual but not currently in a relationship.  Pt describes himself as religious.    Allergies:  No Known Allergies  Labs:  Results for orders placed or performed during the hospital encounter of 04/05/15 (from the past 48 hour(s))  MRSA PCR Screening     Status: None   Collection Time: 04/05/15  4:35 AM  Result Value Ref Range   MRSA by PCR NEGATIVE NEGATIVE    Comment:        The GeneXpert MRSA Assay (FDA approved for NASAL specimens only), is one component of a comprehensive MRSA colonization surveillance program. It is not intended to diagnose MRSA infection nor to guide or monitor treatment for MRSA infections.   Basic metabolic panel - if new onset seizures     Status: Abnormal   Collection Time: 04/05/15  3:31 PM  Result Value Ref Range   Sodium 135 135 - 145 mmol/L   Potassium 3.9 3.5 - 5.1 mmol/L   Chloride 103 101 - 111 mmol/L   CO2 18 (L) 22 - 32 mmol/L   Glucose, Bld 100 (H) 65 - 99 mg/dL   BUN 6 6 - 20 mg/dL   Creatinine, Ser 1.01 0.61 - 1.24 mg/dL   Calcium 10.0 8.9 - 10.3 mg/dL   GFR calc non Af Amer >60 >60 mL/min   GFR calc Af Amer >60 >60 mL/min    Comment: (NOTE) The eGFR has been calculated using the CKD EPI equation. This calculation has not been validated in all clinical situations. eGFR's persistently <60 mL/min signify possible Chronic  Kidney Disease.    Anion gap 14 5 - 15  CBC - if new onset seizures     Status: Abnormal   Collection Time: 04/05/15  3:31 PM  Result Value Ref Range   WBC 18.9 (H) 3.8 - 10.6 K/uL   RBC 4.98 4.40 - 5.90 MIL/uL   Hemoglobin 16.5 13.0 - 18.0 g/dL   HCT 48.8 40.0 - 52.0 %   MCV 98.0 80.0 - 100.0 fL   MCH 33.2 26.0 - 34.0 pg   MCHC 33.9 32.0 - 36.0 g/dL   RDW 19.9 (H) 11.5 - 14.5 %   Platelets 260 150 - 440 K/uL  Urine Drug Screen, Qualitative ([facility] only)     Status: Abnormal   Collection Time: 04/05/15  3:31 PM  Result Value Ref Range   Tricyclic, Ur Screen POSITIVE (A) NONE DETECTED   Amphetamines, Ur Screen NONE DETECTED NONE DETECTED   MDMA (Ecstasy)Ur Screen NONE DETECTED NONE DETECTED   Cocaine Metabolite,Ur [place] NONE DETECTED NONE DETECTED   Opiate, Ur Screen NONE DETECTED NONE DETECTED   Phencyclidine (PCP) Ur S NONE DETECTED NONE DETECTED   Cannabinoid 50 Ng, Ur [place] NONE DETECTED NONE DETECTED   Barbiturates, Ur Screen NONE DETECTED NONE DETECTED   Benzodiazepine, Ur Scrn POSITIVE (A) NONE DETECTED   Methadone Scn, Ur NONE DETECTED NONE DETECTED    Comment: (NOTE) 559  Tricyclics, urine               Cutoff 1000 ng/mL 200  Amphetamines, urine             Cutoff 1000 ng/mL 300  MDMA (Ecstasy), urine           Cutoff 500 ng/mL 400  Cocaine Metabolite, urine       Cutoff 300 ng/mL 500  Opiate, urine                   Cutoff 300 ng/mL 600  Phencyclidine (PCP), urine      Cutoff 25 ng/mL 700  Cannabinoid, urine  Cutoff 50 ng/mL 800  Barbiturates, urine             Cutoff 200 ng/mL 900  Benzodiazepine, urine           Cutoff 200 ng/mL 1000 Methadone, urine                Cutoff 300 ng/mL 1100 1200 The urine drug screen provides only a preliminary, unconfirmed 1300 analytical test result and should not be used for non-medical 1400 purposes. Clinical consideration and professional judgment should 1500 be applied to any positive drug screen result due to  possible 1600 interfering substances. A more specific alternate chemical method 1700 must be used in order to obtain a confirmed analytical result.  1800 Gas chromato graphy / mass spectrometry (GC/MS) is the preferred 1900 confirmatory method.   Hepatic function panel     Status: None   Collection Time: 04/05/15  3:31 PM  Result Value Ref Range   Total Protein 7.9 6.5 - 8.1 g/dL   Albumin 4.1 3.5 - 5.0 g/dL   AST 38 15 - 41 U/L   ALT 52 17 - 63 U/L   Alkaline Phosphatase 113 38 - 126 U/L   Total Bilirubin 0.7 0.3 - 1.2 mg/dL   Bilirubin, Direct 0.2 0.1 - 0.5 mg/dL   Indirect Bilirubin 0.5 0.3 - 0.9 mg/dL  Troponin I     Status: Abnormal   Collection Time: 04/05/15  3:31 PM  Result Value Ref Range   Troponin I 0.13 (H) <0.031 ng/mL    Comment: READ BACK AND VERIFIED WITH SHANNON MARTIN @ 1818 ON 04/05/2015 BY [facility]        PERSISTENTLY INCREASED TROPONIN VALUES IN THE RANGE OF 0.04-0.49 ng/mL CAN BE SEEN IN:       -UNSTABLE ANGINA       -CONGESTIVE HEART FAILURE       -MYOCARDITIS       -CHEST TRAUMA       -ARRYHTHMIAS       -LATE PRESENTING MYOCARDIAL INFARCTION       -COPD   CLINICAL FOLLOW-UP RECOMMENDED.   Urinalysis complete, with microscopic     Status: Abnormal   Collection Time: 04/05/15  3:31 PM  Result Value Ref Range   Color, Urine YELLOW (A) YELLOW   APPearance CLEAR (A) CLEAR   Glucose, UA 50 (A) NEGATIVE mg/dL   Bilirubin Urine NEGATIVE NEGATIVE   Ketones, ur NEGATIVE NEGATIVE mg/dL   Specific Gravity, Urine 1.016 1.005 - 1.030   Hgb urine dipstick 1+ (A) NEGATIVE   pH 5.0 5.0 - 8.0   Protein, ur 100 (A) NEGATIVE mg/dL   Nitrite NEGATIVE NEGATIVE   Leukocytes, UA NEGATIVE NEGATIVE   RBC / HPF 0-5 0 - 5 RBC/hpf   WBC, UA 0-5 0 - 5 WBC/hpf   Bacteria, UA NONE SEEN NONE SEEN   Squamous Epithelial / LPF NONE SEEN NONE SEEN   Mucous PRESENT   Troponin I     Status: Abnormal   Collection Time: 04/05/15  9:25 PM  Result Value Ref Range   Troponin I 2.94  (H) <0.031 ng/mL    Comment: READ BACK AND VERIFIED WITH CASSIE STEWART AT 2203 ON 04/05/15 [facility]        POSSIBLE MYOCARDIAL ISCHEMIA. SERIAL TESTING RECOMMENDED.   Troponin I     Status: Abnormal   Collection Time: 04/06/15  3:48 AM  Result Value Ref Range   Troponin I 3.38 (H) <0.031 ng/mL    Comment:  PREVIOUS RESULT CALLED AT 2203 04/05/15.[facility]        POSSIBLE MYOCARDIAL ISCHEMIA. SERIAL TESTING RECOMMENDED.   CBC     Status: Abnormal   Collection Time: 04/06/15  3:48 AM  Result Value Ref Range   WBC 11.9 (H) 3.8 - 10.6 K/uL   RBC 4.38 (L) 4.40 - 5.90 MIL/uL   Hemoglobin 14.4 13.0 - 18.0 g/dL   HCT 43.2 40.0 - 52.0 %   MCV 98.7 80.0 - 100.0 fL   MCH 32.8 26.0 - 34.0 pg   MCHC 33.3 32.0 - 36.0 g/dL   RDW 20.3 (H) 11.5 - 14.5 %   Platelets 208 150 - 440 K/uL  Basic metabolic panel     Status: Abnormal   Collection Time: 04/06/15  3:48 AM  Result Value Ref Range   Sodium 140 135 - 145 mmol/L   Potassium 3.5 3.5 - 5.1 mmol/L   Chloride 107 101 - 111 mmol/L   CO2 25 22 - 32 mmol/L   Glucose, Bld 103 (H) 65 - 99 mg/dL   BUN 5 (L) 6 - 20 mg/dL   Creatinine, Ser 0.83 0.61 - 1.24 mg/dL   Calcium 8.9 8.9 - 10.3 mg/dL   GFR calc non Af Amer >60 >60 mL/min   GFR calc Af Amer >60 >60 mL/min    Comment: (NOTE) The eGFR has been calculated using the CKD EPI equation. This calculation has not been validated in all clinical situations. eGFR's persistently <60 mL/min signify possible Chronic Kidney Disease.    Anion gap 8 5 - 15  Troponin I     Status: Abnormal   Collection Time: 04/06/15  8:36 AM  Result Value Ref Range   Troponin I 2.84 (H) <0.031 ng/mL    Comment: PREVIOUS RESULT CALLED TO CASSIE STEWART AT 2203 ON 04/05/15 BY [facility].Marland KitchenMarland KitchenUniversity Medical Center Of [facility]        POSSIBLE MYOCARDIAL ISCHEMIA. SERIAL TESTING RECOMMENDED.     Current Facility-Administered Medications  Medication Dose Route Frequency Provider Last Rate Last Dose  . 0.9 %  sodium chloride infusion   Intravenous Continuous  Dustin Flock, MD 75 mL/hr at 04/06/15 1006    . acetaminophen (TYLENOL) tablet 650 mg  650 mg Oral Q6H PRN Dustin Flock, MD       Or  . acetaminophen (TYLENOL) suppository 650 mg  650 mg Rectal Q6H PRN Dustin Flock, MD      . clopidogrel (PLAVIX) tablet 75 mg  75 mg Oral Daily Dustin Flock, MD   75 mg at 04/05/15 2137  . enoxaparin (LOVENOX) injection 90 mg  1 mg/kg Subcutaneous Q12H Lytle Butte, MD   90 mg at 04/06/15 0981  . folic acid (FOLVITE) tablet 1 mg  1 mg Oral Daily Dustin Flock, MD   1 mg at 04/05/15 2137  . hydrOXYzine (ATARAX/VISTARIL) tablet 25 mg  25 mg Oral TID PRN Dustin Flock, MD      . LORazepam (ATIVAN) injection 2-3 mg  2-3 mg Intravenous Q1H PRN Lytle Butte, MD   3 mg at 04/06/15 0957  . losartan (COZAAR) tablet 25 mg  25 mg Oral Daily Dustin Flock, MD   25 mg at 04/06/15 1009  . metoprolol tartrate (LOPRESSOR) tablet 25 mg  25 mg Oral BID Dustin Flock, MD   25 mg at 04/06/15 1009  . morphine 2 MG/ML injection 2 mg  2 mg Intravenous Q4H PRN Lytle Butte, MD   2 mg at 04/06/15 1914  . multivitamin with minerals tablet 1  tablet  1 tablet Oral Daily Dustin Flock, MD   1 tablet at 04/06/15 1012  . nicotine (NICODERM CQ - dosed in mg/24 hours) patch 14 mg  14 mg Transdermal Once Nena Polio, MD   14 mg at 04/05/15 1714  . nicotine (NICODERM CQ - dosed in mg/24 hours) patch 21 mg  21 mg Transdermal Daily Dustin Flock, MD   21 mg at 04/06/15 1011  . nitroGLYCERIN (NITROSTAT) SL tablet 0.4 mg  0.4 mg Sublingual Q5 min PRN Dustin Flock, MD      . ondansetron (ZOFRAN) tablet 4 mg  4 mg Oral Q6H PRN Dustin Flock, MD       Or  . ondansetron (ZOFRAN) injection 4 mg  4 mg Intravenous Q6H PRN Dustin Flock, MD      . oxyCODONE (Oxy IR/ROXICODONE) immediate release tablet 5 mg  5 mg Oral Q4H PRN Lytle Butte, MD   5 mg at 04/06/15 0457  . QUEtiapine (SEROQUEL) tablet 200 mg  200 mg Oral QHS Dustin Flock, MD   200 mg at 04/05/15 2245  . simvastatin  (ZOCOR) tablet 40 mg  40 mg Oral Dellie Catholic, MD   40 mg at 04/06/15 0457  . sodium chloride 0.9 % injection 3 mL  3 mL Intravenous Q12H Dustin Flock, MD   3 mL at 04/06/15 1012  . thiamine (B-1) injection 100 mg  100 mg Intravenous Daily Dustin Flock, MD   100 mg at 04/06/15 0958  . thiamine (VITAMIN B-1) tablet 100 mg  100 mg Oral Daily Dustin Flock, MD   100 mg at 04/06/15 1011  . traZODone (DESYREL) tablet 200 mg  200 mg Oral QHS Dustin Flock, MD   200 mg at 04/05/15 2136  . Vortioxetine HBr TABS 10 mg  10 mg Oral Daily Dustin Flock, MD        Musculoskeletal: Strength & Muscle Tone: within normal limits Gait & Station: not assessed Patient leans: [name]  Psychiatric Specialty Exam: Review of Systems  Psychiatric/Behavioral: Positive for substance abuse. Negative for depression, suicidal ideas, hallucinations and memory loss. The patient is nervous/anxious and has insomnia.   All other systems reviewed and are negative.   Blood pressure 117/77, pulse 92, temperature 99.2 F (37.3 C), temperature source Oral, resp. rate 16, height '5\' 9"'$  (1.753 m), weight 90.719 kg (200 lb), SpO2 91 %.Body mass index is 29.52 kg/(m^2).  General Appearance: Fairly Groomed and sweating  Eye Contact::  Good  Speech:  Clear and Coherent and Normal Rate  Volume:  Normal  Mood:  Anxious, Hopeless and Irritable  Affect:  Irritable  Thought Process:  Coherent, Goal Directed, Intact, Linear and Logical  Orientation:  Full (Time, Place, and Person)  Thought Content:  Negative  Suicidal Thoughts:  No  Homicidal Thoughts:  No  Memory:  Immediate;   Good Recent;   Good Remote;   Good  Judgement:  Fair  Insight:  Fair  Psychomotor Activity:  Restlessness  Concentration:  Good  Recall:  Good  Fund of Knowledge:Good  Language: Good  Akathisia:  Negative  Handed:  Right  AIMS (if indicated):     Assets:  Communication Skills Desire for Improvement Social  Support Talents/Skills Transportation Vocational/Educational  ADL's:  Intact  Cognition: WNL  Sleep:      Patient is a 54 y.o. male with benzodiazepine use disorder, severe; alcohol use disorder, severe; benzodiazepine withdrawal; alcohol withdrawal who is currently on the IM service for management of withdrawals. Pt  shared he no longer wants to use Xanax and alcohol. He denies wanting inpatient treatment for substance withdrawal. Pt may benefit from a long acting benzodiazepine such as diazepam or chlordiazepoxide. This may help to decrease the need for lorazepam as indicated by CIWA. He should also follow-up with psychiatrist (best would be a psychiatrist trained in addiction) to slowly taper the pt off of Xanax. Taper off of benzodiazepines can take several months to a year. This will tend to lower relapse on benzodiazepines. Patient would also benefit from therapy for substance use. Pt may benefit from an SSRI or SNRI for treatment of anxiety. Will not prescribe at this time due to pt's elevated troponins. Of note, pt's 04-05-15 EKG demonstrated QT/QTc 350/412m. Will need to monitor patient's quetiapine and discontinue if QTc continues to increase.Cardiology will see the pt tomorrow. Pt denies SI, HI and AVH. Pt has fleeting SI this morning.   Symptoms are likely due to benzodiazepine withdrawal but it may be of benefit to check pt's TSH and FT4 as hyperthyroidism can cause anxiety. This is less likely as pt has not lost weight and he is withdrawing from benzodiazepines.   Treatment Plan Summary: Medication management  Disposition: No evidence of imminent risk to self or others at present.   Patient does not meet criteria for psychiatric inpatient admission. Supportive therapy provided about ongoing stressors. Discussed crisis plan, support from social network, calling 911, coming to the Emergency Department, and calling Suicide Hotline.  MDonita Brooks12/18/2016 10:24 AM

## 2015-04-07 ENCOUNTER — Encounter: Payer: Self-pay | Admitting: Physician Assistant

## 2015-04-07 ENCOUNTER — Inpatient Hospital Stay (HOSPITAL_COMMUNITY)
Admit: 2015-04-07 | Discharge: 2015-04-07 | Disposition: A | Discharge status: Home / Self Care | Payer: Self-pay | Attending: Specialist | Admitting: Specialist

## 2015-04-07 DIAGNOSIS — R7989 Other specified abnormal findings of blood chemistry: Secondary | ICD-10-CM | POA: Insufficient documentation

## 2015-04-07 DIAGNOSIS — I213 ST elevation (STEMI) myocardial infarction of unspecified site: Secondary | ICD-10-CM

## 2015-04-07 DIAGNOSIS — R778 Other specified abnormalities of plasma proteins: Secondary | ICD-10-CM | POA: Insufficient documentation

## 2015-04-07 LAB — LIPID PANEL
CHOL/HDL RATIO: 3.6 ratio
Cholesterol: 158 mg/dL (ref 0–200)
HDL: 44 mg/dL (ref >40)
LDL Cholesterol: 67 mg/dL (ref 0–99)
Triglycerides: 234 mg/dL — ABNORMAL HIGH (ref <150)
VLDL: 47 mg/dL — ABNORMAL HIGH (ref 0–40)

## 2015-04-07 LAB — HEMOGLOBIN A1C: HEMOGLOBIN A1C: 5 % (ref 4.0–6.0)

## 2015-04-07 MED ORDER — CHLORDIAZEPOXIDE HCL 25 MG PO CAPS
25.0000 mg | ORAL_CAPSULE | Freq: Three times a day (TID) | ORAL | Status: DC
  Administered 2015-04-07 – 2015-04-09 (×6): 25 mg via ORAL
  Filled 2015-04-07 (×6): qty 1

## 2015-04-07 MED ORDER — BUSPIRONE HCL 5 MG PO TABS
5.0000 mg | ORAL_TABLET | Freq: Two times a day (BID) | ORAL | Status: DC
  Administered 2015-04-07 – 2015-04-10 (×6): 5 mg via ORAL
  Filled 2015-04-07 (×6): qty 1

## 2015-04-07 MED ORDER — SODIUM CHLORIDE 0.9 % IJ SOLN
INTRAMUSCULAR | Status: AC
  Administered 2015-04-07: 10 mL
  Filled 2015-04-07: qty 10

## 2015-04-07 MED ORDER — NICOTINE 10 MG IN INHA: 1.0000 | RESPIRATORY_TRACT | Status: DC | PRN

## 2015-04-07 MED ORDER — QUETIAPINE FUMARATE 25 MG PO TABS
100.0000 mg | ORAL_TABLET | Freq: Every day | ORAL | Status: DC
  Administered 2015-04-07 – 2015-04-09 (×3): 100 mg via ORAL
  Filled 2015-04-07 (×2): qty 1
  Filled 2015-04-07: qty 4

## 2015-04-07 MED ORDER — ASPIRIN EC 81 MG PO TBEC
81.0000 mg | DELAYED_RELEASE_TABLET | Freq: Every day | ORAL | Status: DC
  Administered 2015-04-07 – 2015-04-10 (×3): 81 mg via ORAL
  Filled 2015-04-07 (×3): qty 1

## 2015-04-07 MED ORDER — SODIUM CHLORIDE 0.9 % IJ SOLN
INTRAMUSCULAR | Status: AC
  Administered 2015-04-07: 11:00:00
  Filled 2015-04-07: qty 10
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
## 2015-04-07 NOTE — Progress Notes (Signed)
Patient alert and oriented, VSS.  On Room air.    Patient is very anxious, scoring up to 15 on CIWA.    Psych and cardio have seen patient.  Some medications have changed.  Patient still very anxious.    Oxycodone and Morphine given for pain.

## 2015-04-07 NOTE — Progress Notes (Signed)
Notified physician per patient's request for nicotrol inhaler.

## 2015-04-07 NOTE — Progress Notes (Signed)
Patient has rested with eyes closed periodically throughout the night with minimal interruptions. Patient does not display physical signs of detox but has some anxiety. Anxiety is expressed verbally, requests ativan whenever he is awaken. Patient is able to follow instructions. Staff have rounded on patient, patient does not exhibit any complications.

## 2015-04-07 NOTE — Consult Note (Signed)
[facility] Consult   Reason for Consult: Follow-up Referring Physician:  Dustin Flock, MD  Patient Identification: Larry French MRN:  [id number]   Principal Diagnosis: Withdrawal from benzodiazepine [facility])   Diagnosis:   Patient Active Problem List   Diagnosis Date Noted  . Withdrawal from benzodiazepine ([place]) [F13.239] 04/05/2015  . STEMI, RCA DES 05/28/10 [I21.3] 06/01/2011  . CAD, residual CFX LAD disease [I25.10] 06/01/2011  . Tobacco abuse [Z72.0] 05/29/2011  . History of ETOH abuse and Xanax abuse. In rehab pta [F10.10] 05/29/2011    Total Time spent with patient: 30 minutes  Subjective:   Larry French is a 54 y.o. male patient admitted with benzodiazepine and alcohol withdrawal with seizures. Patient was seen for follow-up in his room. Patient reported that he has been receiving Xanax 1 mg 4 times daily from his primary psychiatrist Dr. Jacqualine Code after the death of his brother in 2014/07/01. He reported that he usually takes 4 times daily but has also been mixing the Xanax with his mother who has also been prescribed Xanax by her psychiatrist. He reported that he was given the last prescription on December 6 when he was dispense 120 pills at that time. He reported that he gave 40 pills to his mother as she ran out of her prescription. Patient reported that he started drinking and then consumed 80 pills of Xanax and was out last week. He reported that he went to [facility] for the treatment of alcohol as well as the benzodiazepine.  Patient was placed on lorazepam detox but he started having withdrawal symptoms and was shaking and having heaviness in his head and had a seizure. He reported that he consumes approximately 240 ounces beer in the morning as well as in the evening. He reported that he is currently experiencing withdrawal symptoms at this time. He reported that he is slowly coming back to normal but is shaking as well. He was noted to be tremulous  during the interview.  ve. Larry French shared he has been taking Xanax for four years. His most recent dosage is '1mg'$  po QID. Due to increased stress, the pt has been taking upwards of '6mg'$  po QD since the late summer. Around the same time (or starting in November 2016 - the pt reports both dates), he began to consume more alcohol. He was drinking two 40 ounce beers in the morning and two in the evening. He started to consume alcohol because Xanax was no providing the same relief. He also noted he felt happier drinking alcohol.   Patient stated that he was prescribed Seroquel by his primary psychiatrist to help with the sleep but he is not sleeping well. He reported that he wants to decrease the dose of Seroquel at this time. He currently denied having any suicidal homicidal ideations or plans. He reported that he wants help with his withdrawal symptoms. He appeared anxious during the interview but appears motivated.     Past Psychiatric History: Dx: PTSD related to his attempted murder in 01-Jul-2003; GAD, Social anxiety and depression Psychiatrist: [facility] but pt wants to follow-up with [facility].  Therapist: Endorses at [facility]: Denies ECT: Denies Suicide attempt/Self-harm: Denies Homicide attempts/harming others: Denies Previous Medication Trials: Seroquel, Xanax and Trazodone. Pt denies having tried SSRIs or SNRIs for anxiety.    Risk to Self:   Risk to Others:   Prior Inpatient Therapy:   Prior Outpatient Therapy:    Past Medical History:  Past Medical History  Diagnosis Date  .  Alcohol abuse     Recovered x 15 months  . CA - skin cancer   . Mental disorder   . Headache(784.0)   . Anxiety   . Depression   . CAD, residual CFX LAD disease 06/01/2011    a. s/p inferior ST elevation MI s/p PCI/DES to RCA on 05/29/2011; b. residual LAD and LCx CAD tx'd on 06/02/11 cath with LAD CAD successfully tx'd w/ PCI/DES, LCx managaged medically   . Hypertension   . Hyperlipemia   . PTSD  (post-traumatic stress disorder)   . Polysubstance abuse     a. etoh, xanax, tobacco    Past Surgical History  Procedure Laterality Date  . Tonsillectomy and adenoidectomy    . Left heart cath Right 05/29/2011    Procedure: LEFT HEART CATH;  Surgeon: Leonie Man, MD;  Location: [facility] CATH LAB;  Service: Cardiovascular;  Laterality: Right;  . Left heart catheterization with coronary angiogram [name] 06/01/2011    Procedure: LEFT HEART CATHETERIZATION WITH CORONARY ANGIOGRAM;  Surgeon: Leonie Man, MD;  Location: [facility] CATH LAB;  Service: Cardiovascular;  Laterality: [name];  relook cath, possible PCI   Family History:  Family History  Problem Relation Age of Onset  . Coronary artery disease Maternal Grandfather 5    Died   Family Psychiatric History:  Depression: parents Anxiety: parents Bipolar disorder: Father Schizophrenia: "there is no diagnosis" Panic disorder: parents  Substance abuse: Mother, uses Xanax Alcohol: Brother  Suicide: Denies   Social History:  History  Alcohol Use  . Yes    Comment: this week     History  Drug Use No    Social History   Social History  . Marital Status: Single    Spouse Name: [name]  . Number of Children: 0  . Years of Education: [name]   Occupational History  . Student    Social History Main Topics  . Smoking status: Current Every Day Smoker -- 1.50 packs/day for 30 years    Types: Cigarettes  . Smokeless tobacco: None  . Alcohol Use: Yes     Comment: this week  . Drug Use: No  . Sexual Activity: No   Other Topics Concern  . None   Social History Narrative   Additional Social History: Has 2 degrees.  Lives with his parents.  1 deceased brother, no sisters.  Patient is homosexual but not currently in a relationship.  Pt describes himself as religious.    Allergies:  No Known Allergies  Labs:  Results for orders placed or performed during the hospital encounter of 04/05/15 (from the past 48 hour(s))  Basic metabolic  panel - if new onset seizures     Status: Abnormal   Collection Time: 04/05/15  3:31 PM  Result Value Ref Range   Sodium 135 135 - 145 mmol/L   Potassium 3.9 3.5 - 5.1 mmol/L   Chloride 103 101 - 111 mmol/L   CO2 18 (L) 22 - 32 mmol/L   Glucose, Bld 100 (H) 65 - 99 mg/dL   BUN 6 6 - 20 mg/dL   Creatinine, Ser 1.01 0.61 - 1.24 mg/dL   Calcium 10.0 8.9 - 10.3 mg/dL   GFR calc non Af Amer >60 >60 mL/min   GFR calc Af Amer >60 >60 mL/min    Comment: (NOTE) The eGFR has been calculated using the CKD EPI equation. This calculation has not been validated in all clinical situations. eGFR's persistently <60 mL/min signify possible Chronic Kidney Disease.  Anion gap 14 5 - 15  CBC - if new onset seizures     Status: Abnormal   Collection Time: 04/05/15  3:31 PM  Result Value Ref Range   WBC 18.9 (H) 3.8 - 10.6 K/uL   RBC 4.98 4.40 - 5.90 MIL/uL   Hemoglobin 16.5 13.0 - 18.0 g/dL   HCT 48.8 40.0 - 52.0 %   MCV 98.0 80.0 - 100.0 fL   MCH 33.2 26.0 - 34.0 pg   MCHC 33.9 32.0 - 36.0 g/dL   RDW 19.9 (H) 11.5 - 14.5 %   Platelets 260 150 - 440 K/uL  Urine Drug Screen, Qualitative ([facility] only)     Status: Abnormal   Collection Time: 04/05/15  3:31 PM  Result Value Ref Range   Tricyclic, Ur Screen POSITIVE (A) NONE DETECTED   Amphetamines, Ur Screen NONE DETECTED NONE DETECTED   MDMA (Ecstasy)Ur Screen NONE DETECTED NONE DETECTED   Cocaine Metabolite,Ur [place] NONE DETECTED NONE DETECTED   Opiate, Ur Screen NONE DETECTED NONE DETECTED   Phencyclidine (PCP) Ur S NONE DETECTED NONE DETECTED   Cannabinoid 50 Ng, Ur [place] NONE DETECTED NONE DETECTED   Barbiturates, Ur Screen NONE DETECTED NONE DETECTED   Benzodiazepine, Ur Scrn POSITIVE (A) NONE DETECTED   Methadone Scn, Ur NONE DETECTED NONE DETECTED    Comment: (NOTE) 096  Tricyclics, urine               Cutoff 1000 ng/mL 200  Amphetamines, urine             Cutoff 1000 ng/mL 300  MDMA (Ecstasy), urine           Cutoff 500 ng/mL 400  Cocaine  Metabolite, urine       Cutoff 300 ng/mL 500  Opiate, urine                   Cutoff 300 ng/mL 600  Phencyclidine (PCP), urine      Cutoff 25 ng/mL 700  Cannabinoid, urine              Cutoff 50 ng/mL 800  Barbiturates, urine             Cutoff 200 ng/mL 900  Benzodiazepine, urine           Cutoff 200 ng/mL 1000 Methadone, urine                Cutoff 300 ng/mL 1100 1200 The urine drug screen provides only a preliminary, unconfirmed 1300 analytical test result and should not be used for non-medical 1400 purposes. Clinical consideration and professional judgment should 1500 be applied to any positive drug screen result due to possible 1600 interfering substances. A more specific alternate chemical method 1700 must be used in order to obtain a confirmed analytical result.  1800 Gas chromato graphy / mass spectrometry (GC/MS) is the preferred 1900 confirmatory method.   Hepatic function panel     Status: None   Collection Time: 04/05/15  3:31 PM  Result Value Ref Range   Total Protein 7.9 6.5 - 8.1 g/dL   Albumin 4.1 3.5 - 5.0 g/dL   AST 38 15 - 41 U/L   ALT 52 17 - 63 U/L   Alkaline Phosphatase 113 38 - 126 U/L   Total Bilirubin 0.7 0.3 - 1.2 mg/dL   Bilirubin, Direct 0.2 0.1 - 0.5 mg/dL   Indirect Bilirubin 0.5 0.3 - 0.9 mg/dL  Troponin I     Status: Abnormal  Collection Time: 04/05/15  3:31 PM  Result Value Ref Range   Troponin I 0.13 (H) <0.031 ng/mL    Comment: READ BACK AND VERIFIED WITH SHANNON MARTIN @ 6294 ON 04/05/2015 BY [facility]        PERSISTENTLY INCREASED TROPONIN VALUES IN THE RANGE OF 0.04-0.49 ng/mL CAN BE SEEN IN:       -UNSTABLE ANGINA       -CONGESTIVE HEART FAILURE       -MYOCARDITIS       -CHEST TRAUMA       -ARRYHTHMIAS       -LATE PRESENTING MYOCARDIAL INFARCTION       -COPD   CLINICAL FOLLOW-UP RECOMMENDED.   Urinalysis complete, with microscopic     Status: Abnormal   Collection Time: 04/05/15  3:31 PM  Result Value Ref Range   Color, Urine YELLOW  (A) YELLOW   APPearance CLEAR (A) CLEAR   Glucose, UA 50 (A) NEGATIVE mg/dL   Bilirubin Urine NEGATIVE NEGATIVE   Ketones, ur NEGATIVE NEGATIVE mg/dL   Specific Gravity, Urine 1.016 1.005 - 1.030   Hgb urine dipstick 1+ (A) NEGATIVE   pH 5.0 5.0 - 8.0   Protein, ur 100 (A) NEGATIVE mg/dL   Nitrite NEGATIVE NEGATIVE   Leukocytes, UA NEGATIVE NEGATIVE   RBC / HPF 0-5 0 - 5 RBC/hpf   WBC, UA 0-5 0 - 5 WBC/hpf   Bacteria, UA NONE SEEN NONE SEEN   Squamous Epithelial / LPF NONE SEEN NONE SEEN   Mucous PRESENT   Troponin I     Status: Abnormal   Collection Time: 04/05/15  9:25 PM  Result Value Ref Range   Troponin I 2.94 (H) <0.031 ng/mL    Comment: READ BACK AND VERIFIED WITH CASSIE STEWART AT 2203 ON 04/05/15 [facility]        POSSIBLE MYOCARDIAL ISCHEMIA. SERIAL TESTING RECOMMENDED.   Troponin I     Status: Abnormal   Collection Time: 04/06/15  3:48 AM  Result Value Ref Range   Troponin I 3.38 (H) <0.031 ng/mL    Comment: PREVIOUS RESULT CALLED AT 2203 04/05/15.[facility]        POSSIBLE MYOCARDIAL ISCHEMIA. SERIAL TESTING RECOMMENDED.   CBC     Status: Abnormal   Collection Time: 04/06/15  3:48 AM  Result Value Ref Range   WBC 11.9 (H) 3.8 - 10.6 K/uL   RBC 4.38 (L) 4.40 - 5.90 MIL/uL   Hemoglobin 14.4 13.0 - 18.0 g/dL   HCT 43.2 40.0 - 52.0 %   MCV 98.7 80.0 - 100.0 fL   MCH 32.8 26.0 - 34.0 pg   MCHC 33.3 32.0 - 36.0 g/dL   RDW 20.3 (H) 11.5 - 14.5 %   Platelets 208 150 - 440 K/uL  Basic metabolic panel     Status: Abnormal   Collection Time: 04/06/15  3:48 AM  Result Value Ref Range   Sodium 140 135 - 145 mmol/L   Potassium 3.5 3.5 - 5.1 mmol/L   Chloride 107 101 - 111 mmol/L   CO2 25 22 - 32 mmol/L   Glucose, Bld 103 (H) 65 - 99 mg/dL   BUN 5 (L) 6 - 20 mg/dL   Creatinine, Ser 0.83 0.61 - 1.24 mg/dL   Calcium 8.9 8.9 - 10.3 mg/dL   GFR calc non Af Amer >60 >60 mL/min   GFR calc Af Amer >60 >60 mL/min    Comment: (NOTE) The eGFR has been calculated using the CKD EPI  equation. This  calculation has not been validated in all clinical situations. eGFR's persistently <60 mL/min signify possible Chronic Kidney Disease.    Anion gap 8 5 - 15  Lipid panel     Status: Abnormal   Collection Time: 04/06/15  3:48 AM  Result Value Ref Range   Cholesterol 158 0 - 200 mg/dL   Triglycerides 234 (H) <150 mg/dL   HDL 44 >40 mg/dL   Total CHOL/HDL Ratio 3.6 RATIO   VLDL 47 (H) 0 - 40 mg/dL   LDL Cholesterol 67 0 - 99 mg/dL    Comment:        Total Cholesterol/HDL:CHD Risk Coronary Heart Disease Risk Table                     Men   Women  1/2 Average Risk   3.4   3.3  Average Risk       5.0   4.4  2 X Average Risk   9.6   7.1  3 X Average Risk  23.4   11.0        Use the calculated Patient Ratio above and the CHD Risk Table to determine the patient's CHD Risk.        ATP III CLASSIFICATION (LDL):  <100     mg/dL   Optimal  100-129  mg/dL   Near or Above                    Optimal  130-159  mg/dL   Borderline  160-189  mg/dL   High  >190     mg/dL   Very High   Troponin I     Status: Abnormal   Collection Time: 04/06/15  8:36 AM  Result Value Ref Range   Troponin I 2.84 (H) <0.031 ng/mL    Comment: PREVIOUS RESULT CALLED TO CASSIE STEWART AT 2203 ON 04/05/15 BY [facility].Marland KitchenMarland KitchenOcean Endosurgery Center        POSSIBLE MYOCARDIAL ISCHEMIA. SERIAL TESTING RECOMMENDED.     Current Facility-Administered Medications  Medication Dose Route Frequency Provider Last Rate Last Dose  . 0.9 %  sodium chloride infusion   Intravenous Continuous Dustin Flock, MD 75 mL/hr at 04/07/15 1227    . acetaminophen (TYLENOL) tablet 650 mg  650 mg Oral Q6H PRN Dustin Flock, MD   650 mg at 04/07/15 1227   Or  . acetaminophen (TYLENOL) suppository 650 mg  650 mg Rectal Q6H PRN Dustin Flock, MD      . busPIRone (BUSPAR) tablet 5 mg  5 mg Oral BID Rainey Pines, MD      . chlordiazePOXIDE (LIBRIUM) capsule 25 mg  25 mg Oral TID Rainey Pines, MD      . clopidogrel (PLAVIX) tablet 75 mg  75 mg Oral  Daily Dustin Flock, MD   75 mg at 04/07/15 0919  . enoxaparin (LOVENOX) injection 90 mg  1 mg/kg Subcutaneous Q12H Lytle Butte, MD   90 mg at 52/84/13 2440  . folic acid (FOLVITE) tablet 1 mg  1 mg Oral Daily Dustin Flock, MD   1 mg at 04/07/15 0919  . LORazepam (ATIVAN) tablet 1 mg  1 mg Oral Q6H PRN Lance Coon, MD       Or  . LORazepam (ATIVAN) injection 1 mg  1 mg Intravenous Q6H PRN Lance Coon, MD   1 mg at 04/07/15 1036  . losartan (COZAAR) tablet 25 mg  25 mg Oral Daily Dustin Flock, MD   25 mg at 04/07/15  1610  . metoprolol tartrate (LOPRESSOR) tablet 25 mg  25 mg Oral BID Dustin Flock, MD   25 mg at 04/07/15 0919  . morphine 2 MG/ML injection 2 mg  2 mg Intravenous Q4H PRN Lytle Butte, MD   2 mg at 04/06/15 1559  . multivitamin with minerals tablet 1 tablet  1 tablet Oral Daily Dustin Flock, MD   1 tablet at 04/07/15 0919  . nicotine (NICODERM CQ - dosed in mg/24 hours) patch 21 mg  21 mg Transdermal Daily Dustin Flock, MD   21 mg at 04/07/15 0920  . nitroGLYCERIN (NITROSTAT) SL tablet 0.4 mg  0.4 mg Sublingual Q5 min PRN Dustin Flock, MD      . ondansetron (ZOFRAN) tablet 4 mg  4 mg Oral Q6H PRN Dustin Flock, MD       Or  . ondansetron (ZOFRAN) injection 4 mg  4 mg Intravenous Q6H PRN Dustin Flock, MD      . oxyCODONE (Oxy IR/ROXICODONE) immediate release tablet 5 mg  5 mg Oral Q4H PRN Lytle Butte, MD   5 mg at 04/07/15 0920  . simvastatin (ZOCOR) tablet 40 mg  40 mg Oral Dellie Catholic, MD   40 mg at 04/06/15 0457  . sodium chloride 0.9 % injection 3 mL  3 mL Intravenous Q12H Dustin Flock, MD   3 mL at 04/07/15 1000  . thiamine (B-1) injection 100 mg  100 mg Intravenous Daily Dustin Flock, MD   100 mg at 04/06/15 0958  . thiamine (VITAMIN B-1) tablet 100 mg  100 mg Oral Daily Dustin Flock, MD   100 mg at 04/07/15 0919  . Vortioxetine HBr TABS 10 mg  10 mg Oral Daily Dustin Flock, MD   10 mg at 04/07/15 1000    Musculoskeletal: Strength &  Muscle Tone: within normal limits Gait & Station: not assessed Patient leans: [name]  Psychiatric Specialty Exam: Review of Systems  Psychiatric/Behavioral: Positive for substance abuse. Negative for depression, suicidal ideas, hallucinations and memory loss. The patient is nervous/anxious and has insomnia.   All other systems reviewed and are negative.   Blood pressure 142/84, pulse 77, temperature 98.3 F (36.8 C), temperature source Oral, resp. rate 24, height '5\' 9"'$  (1.753 m), weight 200 lb (90.719 kg), SpO2 94 %.Body mass index is 29.52 kg/(m^2).  General Appearance: Fairly Groomed and sweating  Eye Contact::  Good  Speech:  Clear and Coherent and Normal Rate  Volume:  Normal  Mood:  Anxious, Hopeless and Irritable  Affect:  Irritable  Thought Process:  Coherent, Goal Directed, Intact, Linear and Logical  Orientation:  Full (Time, Place, and Person)  Thought Content:  Negative  Suicidal Thoughts:  No  Homicidal Thoughts:  No  Memory:  Immediate;   Good Recent;   Good Remote;   Good  Judgement:  Fair  Insight:  Fair  Psychomotor Activity:  Restlessness  Concentration:  Good  Recall:  Good  Fund of Knowledge:Good  Language: Good  Akathisia:  Negative  Handed:  Right  AIMS (if indicated):     Assets:  Communication Skills Desire for Improvement Social Support Talents/Skills Transportation Vocational/Educational  ADL's:  Intact  Cognition: WNL  Sleep:        Treatment Plan Summary: Medication management  Disposition: No evidence of imminent risk to self or others at present.   Patient does not meet criteria for psychiatric inpatient admission. Supportive therapy provided about ongoing stressors. Discussed crisis plan, support from social network, calling 911,  coming to the Emergency Department, and calling Suicide Hotline.   Discussed with the patient at length about the medications and I will start him on Librium 25 mg by mouth 3 times a day for alcohol and  benzodiazepine withdrawal symptoms He will continue on CIWA protocol I will also start him on Seroquel 100 mg at bedtime I will start him on BuSpar 5 mg by mouth twice a day to help with the anxiety symptoms When he will become medically stable he can be discharged from the hospital and will continue with his outpatient psychiatrist for continuity of care  Thank you for allowing me to participate in the care of this patient   This note was generated in part or whole with voice recognition software. Voice regonition is usually quite accurate but there are transcription errors that can and very often do occur. I apologize for any typographical errors that were not detected and corrected.   Rainey Pines 04/07/2015 2:20 PM

## 2015-04-07 NOTE — Progress Notes (Signed)
[facility] NAME: [facility]    MR#:  [id number]  DATE OF BIRTH:  [birth year]  SUBJECTIVE:   Patient here due to a seizure episode secondary from alcohol/benzodiazepine withdrawal. No further seizures overnight and clinically stable presently.  Noted to have an elevated troponin but no chest pain. Awaiting further Cards input.   REVIEW OF SYSTEMS:    Review of Systems  Constitutional: Negative for fever and chills.  HENT: Negative for congestion and tinnitus.   Eyes: Negative for blurred vision and double vision.  Respiratory: Negative for cough, shortness of breath and wheezing.   Cardiovascular: Negative for chest pain, orthopnea and PND.  Gastrointestinal: Negative for nausea, vomiting, abdominal pain and diarrhea.  Genitourinary: Negative for dysuria and hematuria.  Neurological: Negative for dizziness, sensory change and focal weakness.  Psychiatric/Behavioral: The patient is nervous/anxious.   All other systems reviewed and are negative.   Nutrition: Regular Tolerating Diet: Yes Tolerating PT: Ambulatory  DRUG ALLERGIES:  No Known Allergies  VITALS:  Blood pressure 142/84, pulse 77, temperature 98.3 F (36.8 C), temperature source Oral, resp. rate 24, height 5\' 9"  (1.753 m), weight 90.719 kg (200 lb), SpO2 94 %.  PHYSICAL EXAMINATION:   Physical Exam  GENERAL:  54 y.o.-year-old patient lying in the bed with no acute distress.  EYES: Pupils equal, round, reactive to light and accommodation. No scleral icterus. Extraocular muscles intact.  HEENT: Head atraumatic, normocephalic. Oropharynx and nasopharynx clear.  NECK:  Supple, no jugular venous distention. No thyroid enlargement, no tenderness.  LUNGS: Normal breath sounds bilaterally, no wheezing, rales, rhonchi. No use of accessory muscles of respiration.  CARDIOVASCULAR: S1, S2 normal. No murmurs, rubs, or gallops.  ABDOMEN: Soft, nontender,  nondistended. Bowel sounds present. No organomegaly or mass.  EXTREMITIES: No cyanosis, clubbing or edema b/l.    NEUROLOGIC: Cranial nerves II through XII are intact. No focal Motor or sensory deficits b/l.   PSYCHIATRIC: The patient is alert and oriented x 3. Anxious SKIN: No obvious rash, lesion, or ulcer.    LABORATORY PANEL:   CBC  Recent Labs Lab 04/06/15 0348  WBC 11.9*  HGB 14.4  HCT 43.2  PLT 208   ------------------------------------------------------------------------------------------------------------------  Chemistries   Recent Labs Lab 04/05/15 1531 04/06/15 0348  NA 135 140  K 3.9 3.5  CL 103 107  CO2 18* 25  GLUCOSE 100* 103*  BUN 6 5*  CREATININE 1.01 0.83  CALCIUM 10.0 8.9  AST 38  --   ALT 52  --   ALKPHOS 113  --   BILITOT 0.7  --    ------------------------------------------------------------------------------------------------------------------  Cardiac Enzymes  Recent Labs Lab 04/06/15 0836  TROPONINI 2.84*   ------------------------------------------------------------------------------------------------------------------  RADIOLOGY:  Dg Lumbar Spine Complete  04/05/2015  CLINICAL DATA:  Low back pain today, no known injury EXAM: LUMBAR SPINE - COMPLETE 4+ VIEW COMPARISON:  None FINDINGS: Osseous demineralization. Five non-rib-bearing lumbar vertebra. Multilevel disc space narrowing and endplate spur formation greatest at L4-L5. Superior endplate of compression fracture of L1 vertebral body, appears acute, with approximately 30% anterior height loss. No additional fracture, subluxation or bone destruction. No spondylolysis. SI joints symmetric. IMPRESSION: Acute appearing superior endplate compression fracture of L1 vertebral body with approximately 30% anterior height loss. Osseous demineralization with multilevel degenerative disc disease changes lumbar spine. Electronically Signed   By: Lavonia Dana M.D.   On: 04/05/2015 20:04   Ct  Head Wo Contrast  04/05/2015  CLINICAL DATA:  New onset seizure today. EXAM: CT HEAD WITHOUT CONTRAST TECHNIQUE: Contiguous axial images were obtained from the base of the skull through the vertex without intravenous contrast. COMPARISON:  Head CT 11/24/2013 FINDINGS: No intracranial hemorrhage, mass effect, or midline shift. Chronic small vessel ischemia and remote lacunar infarcts in the left basal ganglia, unchanged. No hydrocephalus. The basilar cisterns are patent. No evidence of territorial infarct. No intracranial fluid collection. Atherosclerosis noted of skullbase vasculature, advanced for age. Calvarium is intact. Sclerotic density in the right parietal bone, unchanged from prior. Included paranasal sinuses and mastoid air cells are well aerated. 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 No CT findings of acute intracranial abnormality. Electronically Signed   By: Jeb Levering M.D.   On: 04/05/2015 18:25   Dg Chest Portable 1 View  04/05/2015  CLINICAL DATA:  Seizure today, shortness of breath, EKG changes, coronary artery disease post MI, hypertension, smoker EXAM: PORTABLE CHEST 1 VIEW COMPARISON:  Portable exam 1715 hours compared to 03/07/2014 FINDINGS: Upper normal heart size. Mediastinal contours and pulmonary vascularity normal. Lungs clear. No pleural effusion or pneumothorax. No acute osseous findings. IMPRESSION: No acute abnormalities. Electronically Signed   By: Lavonia Dana M.D.   On: 04/05/2015 17:47     ASSESSMENT AND PLAN:   54 year old male with past medical history of anxiety, alcohol abuse, hx of coronary artery disease, PTSD, hypertension, hyperlipidemia, depression who presented to the hospital due to seizures.  #1 seizures-this was likely due to alcohol withdrawal/withdrawal from benzodiazepines. -Patient apparently was getting detox for benzo abuse and left early from [facility] and was drinking to help with his  anxiety.  He stopped ETOH abuse 2 days ago.  - no further evidence of seizures overnight.   - cont. CIWA, Ativan PRN.   #2 alcohol abuse/withdrawal-continue CIWA protocol. -Continue thiamine, folate.  #3 elevated troponin-patient has no acute chest pain. His troponin's have trended upward -appreciate Cardiology input and await 2-D echo results and will plan cath (vs) functional study based on Echo results. Discussed w/ Dr. Fletcher Anon.  -Patient has a history of coronary artery disease with a cardiac catheterization done in 2013. -Continue aspirin, Plavix, Lovenox, beta blocker, losartan, simvastatin  #4 hyperlipidemia-continue simvastatin.  #5 tobacco abuse-continue nicotine patch.  #6 hypertension-continue metoprolol, losartan.  #7 anxiety/benzodiazepine withdrawal-appreciate Psych consult.   - started on Librium, Seroquel, Buspar and cont. Care as per Psych.   All the records are reviewed and case discussed with Care Management/Social Workerr. Management plans discussed with the patient, family and they are in agreement.  CODE STATUS: Full  DVT Prophylaxis: Lovenox   TOTAL TIME TAKING CARE OF THIS PATIENT: 30 minutes.   POSSIBLE D/C IN 1-2 DAYS, DEPENDING ON CLINICAL CONDITION.   Henreitta Leber M.D on 04/07/2015 at 3:32 PM  Between 7am to 6pm - Pager - [phone]  After 6pm go to [email or web address] - password [email or web address] [facility]  Office  [phone]  CC: Primary care physician; Pcp Not In System

## 2015-04-07 NOTE — Progress Notes (Signed)
Notified physician for clarification of CIWA orders, adjustment made based on assessment.

## 2015-04-07 NOTE — Progress Notes (Signed)
*  PRELIMINARY RESULTS* Echocardiogram 2D Echocardiogram has been performed.  Lar[facility] 04/07/2015, 3:09 PM

## 2015-04-08 ENCOUNTER — Inpatient Hospital Stay (HOSPITAL_COMMUNITY): Payer: Self-pay

## 2015-04-08 ENCOUNTER — Encounter: Payer: Self-pay | Admitting: Radiology

## 2015-04-08 DIAGNOSIS — R9439 Abnormal result of other cardiovascular function study: Secondary | ICD-10-CM | POA: Insufficient documentation

## 2015-04-08 DIAGNOSIS — F13239 Sedative, hypnotic or anxiolytic dependence with withdrawal, unspecified: Secondary | ICD-10-CM | POA: Insufficient documentation

## 2015-04-08 DIAGNOSIS — I214 Non-ST elevation (NSTEMI) myocardial infarction: Secondary | ICD-10-CM | POA: Insufficient documentation

## 2015-04-08 DIAGNOSIS — R7989 Other specified abnormal findings of blood chemistry: Secondary | ICD-10-CM

## 2015-04-08 DIAGNOSIS — R9431 Abnormal electrocardiogram [ECG] [EKG]: Secondary | ICD-10-CM

## 2015-04-08 DIAGNOSIS — F13939 Sedative, hypnotic or anxiolytic use, unspecified with withdrawal, unspecified: Secondary | ICD-10-CM | POA: Insufficient documentation

## 2015-04-08 LAB — NM MYOCAR MULTI W/SPECT W/WALL MOTION / EF
CHL CUP NUCLEAR SDS: 8
CHL CUP NUCLEAR SRS: 1
CSEPHR: 53 %
LV sys vol: 38 mL
LVDIAVOL: 109 mL
Peak HR: 90 {beats}/min
Rest HR: 67 {beats}/min
SSS: 9
TID: 1.09

## 2015-04-08 LAB — CBC
HCT: 42 % (ref 40.0–52.0)
Hemoglobin: 13.7 g/dL (ref 13.0–18.0)
MCH: 32.3 pg (ref 26.0–34.0)
MCHC: 32.8 g/dL (ref 32.0–36.0)
MCV: 98.8 fL (ref 80.0–100.0)
PLATELETS: 210 10*3/uL (ref 150–440)
RBC: 4.25 MIL/uL — ABNORMAL LOW (ref 4.40–5.90)
RDW: 19.1 % — AB (ref 11.5–14.5)
WBC: 8.7 10*3/uL (ref 3.8–10.6)

## 2015-04-08 LAB — CREATININE, SERUM
Creatinine, Ser: 0.87 mg/dL (ref 0.61–1.24)
GFR calc non Af Amer: >60 mL/min (ref >60)

## 2015-04-08 MED ORDER — REGADENOSON 0.4 MG/5ML IV SOLN
0.4000 mg | Freq: Once | INTRAVENOUS | Status: AC
  Administered 2015-04-08: 0.4 mg via INTRAVENOUS
  Filled 2015-04-08: qty 5

## 2015-04-08 MED ORDER — TECHNETIUM TC 99M SESTAMIBI - CARDIOLITE
10.0000 | Freq: Once | INTRAVENOUS | Status: AC | PRN
  Administered 2015-04-08: 13.04 via INTRAVENOUS

## 2015-04-08 MED ORDER — BUSPIRONE HCL 5 MG PO TABS: 5.0000 mg | ORAL_TABLET | Freq: Two times a day (BID) | ORAL | Status: DC

## 2015-04-08 MED ORDER — ASPIRIN 81 MG PO TBEC: 81.0000 mg | DELAYED_RELEASE_TABLET | Freq: Every day | ORAL | Status: DC

## 2015-04-08 MED ORDER — TECHNETIUM TC 99M SESTAMIBI - CARDIOLITE
31.0100 | Freq: Once | INTRAVENOUS | Status: AC | PRN
  Administered 2015-04-08: 31.01 via INTRAVENOUS

## 2015-04-08 MED ORDER — CHLORDIAZEPOXIDE HCL 25 MG PO CAPS: 25.0000 mg | ORAL_CAPSULE | Freq: Every day | ORAL | Status: DC
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
## 2015-04-08 NOTE — Progress Notes (Signed)
Clinical Education officer, museum (CSW) attempted to meet with patient to provide substance abuse resources however he was off the floor for a stress test.   Blima Rich, [place] [phone]

## 2015-04-08 NOTE — Progress Notes (Signed)
Patient: Larry French / Admit Date: 04/05/2015 / Date of Encounter: 04/08/2015, 8:48 PM   Subjective: Denies any chest pain today Stress test showing ischemia in the inferolateral region Mildly depressed ejection fraction Patient arguing with parents over the phone, significant issues concerning the patient's drug abuse/substance abuse. Father has expressed over the phone that he does not want him coming home And wonders if I have some place to "put him"   Review of Systems: Review of Systems  Constitutional: Negative.   Respiratory: Negative.   Cardiovascular: Negative.   Gastrointestinal: Negative.   Musculoskeletal: Negative.   Neurological: Negative.   Psychiatric/Behavioral: Negative.   All other systems reviewed and are negative.   Objective: Telemetry: nsr Physical Exam: Blood pressure 169/95, pulse 74, temperature 98.1 F (36.7 C), temperature source Oral, resp. rate 20, height 5\' 9"  (1.753 m), weight 200 lb (90.719 kg), SpO2 97 %. Body mass index is 29.52 kg/(m^2). General: Well developed, well nourished, in no acute distress. Head: Normocephalic, atraumatic, sclera non-icteric, no xanthomas, nares are without discharge. Neck: Negative for carotid bruits. JVP not elevated. Lungs: Clear bilaterally to auscultation without wheezes, rales, or rhonchi. Breathing is unlabored. Heart: RRR S1 S2 without murmurs, rubs, or gallops.  Abdomen: Soft, non-tender, non-distended with normoactive bowel sounds. No rebound/guarding. Extremities: No clubbing or cyanosis. No edema. Distal pedal pulses are 2+ and equal bilaterally. Neuro: Alert and oriented X 3. Moves all extremities spontaneously. Psych:  Responds to questions appropriately with a normal affect.   Intake/Output Summary (Last 24 hours) at 04/08/15 2048 Last data filed at 04/08/15 1828  Gross per 24 hour  Intake   1725 ml  Output   3150 ml  Net  -1425 ml    Inpatient Medications:  . aspirin EC  81 mg  Oral Daily  . busPIRone  5 mg Oral BID  . chlordiazePOXIDE  25 mg Oral TID  . clopidogrel  75 mg Oral Daily  . enoxaparin (LOVENOX) injection  1 mg/kg Subcutaneous Q12H  . folic acid  1 mg Oral Daily  . losartan  25 mg Oral Daily  . metoprolol tartrate  25 mg Oral BID  . multivitamin with minerals  1 tablet Oral Daily  . nicotine  21 mg Transdermal Daily  . QUEtiapine  100 mg Oral QHS  . simvastatin  40 mg Oral BH-[id number]  . sodium chloride  3 mL Intravenous Q12H  . thiamine  100 mg Intravenous Daily  . thiamine  100 mg Oral Daily  . Vortioxetine HBr  10 mg Oral Daily   Infusions:  . sodium chloride 75 mL/hr at 04/08/15 1451    Labs:  Recent Labs  04/06/15 0348 04/08/15 0613  NA 140  --   K 3.5  --   CL 107  --   CO2 25  --   GLUCOSE 103*  --   BUN 5*  --   CREATININE 0.83 0.87  CALCIUM 8.9  --    No results for input(s): AST, ALT, ALKPHOS, BILITOT, PROT, ALBUMIN in the last 72 hours.  Recent Labs  04/06/15 0348 04/08/15 0613  WBC 11.9* 8.7  HGB 14.4 13.7  HCT 43.2 42.0  MCV 98.7 98.8  PLT 208 210    Recent Labs  04/05/15 2125 04/06/15 0348 04/06/15 0836  TROPONINI 2.94* 3.38* 2.84*   Invalid input(s): POCBNP  Recent Labs  04/06/15 0348  HGBA1C 5.0     Weights: Filed Weights   04/05/15 1526  Weight: 200 lb (  90.719 kg)     Radiology/Studies:  Dg Lumbar Spine Complete  04/05/2015  CLINICAL DATA:  Low back pain today, no known injury EXAM: LUMBAR SPINE - COMPLETE 4+ VIEW COMPARISON:  None FINDINGS: Osseous demineralization. Five non-rib-bearing lumbar vertebra. Multilevel disc space narrowing and endplate spur formation greatest at L4-L5. Superior endplate of compression fracture of L1 vertebral body, appears acute, with approximately 30% anterior height loss. No additional fracture, subluxation or bone destruction. No spondylolysis. SI joints symmetric. IMPRESSION: Acute appearing superior endplate compression fracture of L1 vertebral body with  approximately 30% anterior height loss. Osseous demineralization with multilevel degenerative disc disease changes lumbar spine. Electronically Signed   By: Lavonia Dana M.D.   On: 04/05/2015 20:04   Ct Head Wo Contrast  04/05/2015  CLINICAL DATA:  New onset seizure today. EXAM: CT HEAD WITHOUT CONTRAST TECHNIQUE: Contiguous axial images were obtained from the base of the skull through the vertex without intravenous contrast. COMPARISON:  Head CT 11/24/2013 FINDINGS: No intracranial hemorrhage, mass effect, or midline shift. Chronic small vessel ischemia and remote lacunar infarcts in the left basal ganglia, unchanged. No hydrocephalus. The basilar cisterns are patent. No evidence of territorial infarct. No intracranial fluid collection. Atherosclerosis noted of skullbase vasculature, advanced for age. Calvarium is intact. Sclerotic density in the right parietal bone, unchanged from prior. Included paranasal sinuses and mastoid air cells are well aerated. 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 No CT findings of acute intracranial abnormality. Electronically Signed   By: Jeb Levering M.D.   On: 04/05/2015 18:25   Nm Myocar Multi W/spect W/wall Motion / Ef  04/08/2015   There was no ST segment deviation noted during stress.  Pharmacological  myocardial perfusion imaging study with moderate sized region of ischemia noted in the  basal to mid inferior and inferolateral wall This is a moderate sized region of moderate intensity perfusion defect noted in the basal to mid inferior and inferolateral wall Concern for hypokinesis in the basal inferior region, EF estimated at 49% No EKG changes concerning for ischemia. Moderate  risk scan Clinical correlation suggested. Signed, Esmond Plants, MD [facility]   Dg Chest Portable 1 View  04/05/2015  CLINICAL DATA:  Seizure today, shortness of breath, EKG changes, coronary artery disease post MI,  hypertension, smoker EXAM: PORTABLE CHEST 1 VIEW COMPARISON:  Portable exam 1715 hours compared to 03/07/2014 FINDINGS: Upper normal heart size. Mediastinal contours and pulmonary vascularity normal. Lungs clear. No pleural effusion or pneumothorax. No acute osseous findings. IMPRESSION: No acute abnormalities. Electronically Signed   By: Lavonia Dana M.D.   On: 04/05/2015 17:47     Assessment and Plan  54 y.o. male   54 year old male with past medical history of anxiety, alcohol abuse, hx of coronary artery disease, PTSD, hypertension, hyperlipidemia, depression who presented to the hospital due to seizures.   1. Elevated troponin: Non-STEMI Stress test today showing ischemia in the basal to mid inferior and inferolateral wall Scheduled for cardiac catheterization tomorrow Risk and benefit discussed with the patient, including risk of stroke, heart attack, he is willing to proceed Scheduled for second case tomorrow Orders have been placed Discussed with his parents over the phone  2. CAD s/p PCI as above: -Continue aspirin and Plavix -Lopressor 25 mg bid and simvastatin 40 mg qhs  3. Seizures: -CIWA -Per IM  4. Polysubstance abuse: -Thiamine and folate -Per IM  5. HLD: -Simvastatin as above -FLP pending  6. Anxiety: -Psych  on board  Father expressed over the phone that he does not want to have anything else to do with his son given his alcohol and substance abuse issues. He would not be available to pick up his son, does not want him coming back home.   Signed, Esmond Plants, MD [facility] 04/08/2015, 8:48 PM

## 2015-04-08 NOTE — Care Management (Signed)
It appears that patient's stress test is positive.  Awaiting input from attending/cardiology

## 2015-04-08 NOTE — Progress Notes (Signed)
[place] at [place] NAME: Larry French    MR#:  [id number]  DATE OF BIRTH:  [birth year]  SUBJECTIVE:   Patient here due to a seizure episode secondary from alcohol/benzodiazepine withdrawal. No further seizures overnight and clinically stable presently.  Had stress test today which was + and therefore going for Cardiac cath tomorrow.  No chest pain presently. Anxiety improved.   REVIEW OF SYSTEMS:    Review of Systems  Constitutional: Negative for fever and chills.  HENT: Negative for congestion and tinnitus.   Eyes: Negative for blurred vision and double vision.  Respiratory: Negative for cough, shortness of breath and wheezing.   Cardiovascular: Negative for chest pain, orthopnea and PND.  Gastrointestinal: Negative for nausea, vomiting, abdominal pain and diarrhea.  Genitourinary: Negative for dysuria and hematuria.  Neurological: Negative for dizziness, sensory change and focal weakness.  Psychiatric/Behavioral: The patient is not nervous/anxious.   All other systems reviewed and are negative.   Nutrition: Regular Tolerating Diet: Yes Tolerating PT: Ambulatory  DRUG ALLERGIES:  No Known Allergies  VITALS:  Blood pressure 182/97, pulse 71, temperature 97.6 F (36.4 C), temperature source Oral, resp. rate 21, height 5\' 9"  (1.753 m), weight 90.719 kg (200 lb), SpO2 97 %.  PHYSICAL EXAMINATION:   Physical Exam  GENERAL:  54 y.o.-year-old patient lying in the bed with no acute distress.  EYES: Pupils equal, round, reactive to light and accommodation. No scleral icterus. Extraocular muscles intact.  HEENT: Head atraumatic, normocephalic. Oropharynx and nasopharynx clear.  NECK:  Supple, no jugular venous distention. No thyroid enlargement, no tenderness.  LUNGS: Normal breath sounds bilaterally, no wheezing, rales, rhonchi. No use of accessory muscles of respiration.  CARDIOVASCULAR: S1, S2 normal. No murmurs, rubs,  or gallops.  ABDOMEN: Soft, nontender, nondistended. Bowel sounds present. No organomegaly or mass.  EXTREMITIES: No cyanosis, clubbing or edema b/l.    NEUROLOGIC: Cranial nerves II through XII are intact. No focal Motor or sensory deficits b/l.   PSYCHIATRIC: The patient is alert and oriented x 3.  SKIN: No obvious rash, lesion, or ulcer.    LABORATORY PANEL:   CBC  Recent Labs Lab 04/08/15 0613  WBC 8.7  HGB 13.7  HCT 42.0  PLT 210   ------------------------------------------------------------------------------------------------------------------  Chemistries   Recent Labs Lab 04/05/15 1531 04/06/15 0348 04/08/15 0613  NA 135 140  --   K 3.9 3.5  --   CL 103 107  --   CO2 18* 25  --   GLUCOSE 100* 103*  --   BUN 6 5*  --   CREATININE 1.01 0.83 0.87  CALCIUM 10.0 8.9  --   AST 38  --   --   ALT 52  --   --   ALKPHOS 113  --   --   BILITOT 0.7  --   --    ------------------------------------------------------------------------------------------------------------------  Cardiac Enzymes  Recent Labs Lab 04/06/15 0836  TROPONINI 2.84*   ------------------------------------------------------------------------------------------------------------------  RADIOLOGY:  Nm Myocar Multi W/spect W/wall Motion / Ef  04/08/2015   There was no ST segment deviation noted during stress.  Pharmacological  myocardial perfusion imaging study with moderate sized region of ischemia noted in the  basal to mid inferior and inferolateral wall This is a moderate sized region of moderate intensity perfusion defect noted in the basal to mid inferior and inferolateral wall Concern for hypokinesis in the basal inferior region, EF estimated at 49% No EKG changes concerning for ischemia.  Moderate  risk scan Clinical correlation suggested. Signed, Esmond Plants, MD [facility]     ASSESSMENT AND PLAN:   54 year old male with past medical history of anxiety, alcohol abuse, hx of coronary  artery disease, PTSD, hypertension, hyperlipidemia, depression who presented to the hospital due to seizures.  #1 seizures-this was likely due to alcohol withdrawal/withdrawal from benzodiazepines. -Patient apparently was getting detox for benzo abuse and left early from [facility] and was drinking to help with his anxiety.  He stopped ETOH abuse 2 days prior to admission.   - no further evidence of seizures since in the hospital.    - cont. CIWA, Ativan PRN.   #2 alcohol abuse/withdrawal-continue CIWA protocol. -Continue thiamine, folate.  #3 elevated troponin-patient has no acute chest pain. His troponin's have trended upward -Echo yesterday showed normal EF.  Had stress test today which is abnormal showing a moderate sized region of moderate intensity perfusion defect noted in the basal to mid inferior and inferolateral wall  Concern for hypokinesis in the basal inferior region, EF estimated at 49% -Patient has a history of coronary artery disease and discussed w/ Dr. Rockey Situ and pt. To have cardiac cath in a.m. Tomorrow.  Notified pt.   -Continue aspirin, Plavix, Lovenox, beta blocker, losartan, simvastatin  #4 hyperlipidemia-continue simvastatin.  #5 tobacco abuse-continue nicotine patch.  #6 hypertension-continue metoprolol, losartan.  #7 anxiety/benzodiazepine withdrawal-appreciate Psych consult.   - cont. Librium, Seroquel, Buspar and cont. Care as per Psych.   All the records are reviewed and case discussed with Care Management/Social Worker. Management plans discussed with the patient, family and they are in agreement.  CODE STATUS: Full  DVT Prophylaxis: Lovenox   TOTAL TIME TAKING CARE OF THIS PATIENT: 25 minutes.   POSSIBLE D/C IN 1-2 DAYS, DEPENDING ON CLINICAL CONDITION.   Henreitta Leber M.D on 04/08/2015 at 4:00 PM  Between 7am to 6pm - Pager - [phone]  After 6pm go to [email or web address] - password [email or web address] [facility]  Office   [phone]  CC: Primary care physician; Pcp Not In System

## 2015-04-08 NOTE — Plan of Care (Signed)
Problem: Safety: Goal: Ability to remain free from injury will improve Outcome: Progressing Patient ambulates well. Scored as a high fall risk, but refuses the bed alarm. He's been educated on the risks for falling and to call before getting up.

## 2015-04-09 ENCOUNTER — Encounter: Payer: Self-pay | Admitting: Cardiovascular Disease

## 2015-04-09 ENCOUNTER — Encounter
Admission: EM | Disposition: A | Discharge status: Home / Self Care | Payer: Self-pay | Source: Home / Self Care | Attending: Specialist

## 2015-04-09 DIAGNOSIS — I214 Non-ST elevation (NSTEMI) myocardial infarction: Secondary | ICD-10-CM

## 2015-04-09 HISTORY — PX: CARDIAC CATHETERIZATION: SHX172

## 2015-04-09 LAB — CREATININE, SERUM
Creatinine, Ser: 0.71 mg/dL (ref 0.61–1.24)
GFR calc Af Amer: >60 mL/min (ref >60)
GFR calc non Af Amer: >60 mL/min (ref >60)

## 2015-04-09 LAB — CBC
HEMATOCRIT: 41.2 % (ref 40.0–52.0)
HEMOGLOBIN: 13.7 g/dL (ref 13.0–18.0)
MCH: 32.7 pg (ref 26.0–34.0)
MCHC: 33.1 g/dL (ref 32.0–36.0)
MCV: 98.7 fL (ref 80.0–100.0)
Platelets: 218 10*3/uL (ref 150–440)
RBC: 4.17 MIL/uL — AB (ref 4.40–5.90)
RDW: 18.5 % — ABNORMAL HIGH (ref 11.5–14.5)
WBC: 8.9 10*3/uL (ref 3.8–10.6)

## 2015-04-09 SURGERY — LEFT HEART CATH AND CORONARY ANGIOGRAPHY
Anesthesia: Moderate Sedation

## 2015-04-09 MED ORDER — BIVALIRUDIN BOLUS VIA INFUSION - CUPID
INTRAVENOUS | Status: DC | PRN
  Administered 2015-04-09: 68.025 mg via INTRAVENOUS

## 2015-04-09 MED ORDER — SODIUM CHLORIDE 0.9 % IJ SOLN: 3.0000 mL | INTRAMUSCULAR | Status: DC | PRN

## 2015-04-09 MED ORDER — CLOPIDOGREL BISULFATE 75 MG PO TABS
ORAL_TABLET | ORAL | Status: AC
  Filled 2015-04-09: qty 4

## 2015-04-09 MED ORDER — METOPROLOL TARTRATE 1 MG/ML IV SOLN
INTRAVENOUS | Status: AC
  Filled 2015-04-09: qty 5

## 2015-04-09 MED ORDER — NITROGLYCERIN 5 MG/ML IV SOLN
INTRAVENOUS | Status: AC
  Filled 2015-04-09: qty 10

## 2015-04-09 MED ORDER — FENTANYL CITRATE (PF) 100 MCG/2ML IJ SOLN
INTRAMUSCULAR | Status: AC
  Filled 2015-04-09: qty 2

## 2015-04-09 MED ORDER — SODIUM CHLORIDE 0.9 % IV SOLN
250.0000 mg | INTRAVENOUS | Status: DC | PRN
  Administered 2015-04-09: 1.75 mg/kg/h via INTRAVENOUS

## 2015-04-09 MED ORDER — SODIUM CHLORIDE 0.9 % WEIGHT BASED INFUSION: 1.0000 mL/kg/h | INTRAVENOUS | Status: DC

## 2015-04-09 MED ORDER — CHLORDIAZEPOXIDE HCL 25 MG PO CAPS
25.0000 mg | ORAL_CAPSULE | Freq: Every day | ORAL | Status: DC
  Administered 2015-04-10: 25 mg via ORAL
  Filled 2015-04-09: qty 1

## 2015-04-09 MED ORDER — NITROGLYCERIN 1 MG/10 ML FOR IR/CATH LAB
INTRA_ARTERIAL | Status: DC | PRN
  Administered 2015-04-09: 200 ug via INTRACORONARY
  Administered 2015-04-09: 300 ug via INTRACORONARY

## 2015-04-09 MED ORDER — DIPHENHYDRAMINE HCL 50 MG/ML IJ SOLN
INTRAMUSCULAR | Status: AC
Start: 2015-04-09 — End: 2015-04-09
  Filled 2015-04-09: qty 1

## 2015-04-09 MED ORDER — MIDAZOLAM HCL 2 MG/2ML IJ SOLN
INTRAMUSCULAR | Status: AC
  Filled 2015-04-09: qty 2

## 2015-04-09 MED ORDER — SODIUM CHLORIDE 0.9 % IV SOLN
INTRAVENOUS | Status: AC
  Administered 2015-04-09: 14:00:00 via INTRAVENOUS

## 2015-04-09 MED ORDER — VERAPAMIL HCL 2.5 MG/ML IV SOLN
INTRAVENOUS | Status: DC | PRN
  Administered 2015-04-09: 2.5 mg via INTRA_ARTERIAL

## 2015-04-09 MED ORDER — FENTANYL CITRATE (PF) 100 MCG/2ML IJ SOLN
INTRAMUSCULAR | Status: AC
Start: 2015-04-09 — End: 2015-04-09
  Filled 2015-04-09: qty 2

## 2015-04-09 MED ORDER — METOPROLOL TARTRATE 1 MG/ML IV SOLN
INTRAVENOUS | Status: DC | PRN
  Administered 2015-04-09: 5 mg via INTRAVENOUS

## 2015-04-09 MED ORDER — CEFAZOLIN (ANCEF) 1 G IV SOLR
INTRAVENOUS | Status: DC | PRN
  Administered 2015-04-09: 2 g

## 2015-04-09 MED ORDER — HEPARIN SODIUM (PORCINE) 1000 UNIT/ML IJ SOLN
INTRAMUSCULAR | Status: AC
  Filled 2015-04-09: qty 1

## 2015-04-09 MED ORDER — CLOPIDOGREL BISULFATE 75 MG PO TABS
ORAL_TABLET | ORAL | Status: DC | PRN
  Administered 2015-04-09: 300 mg via ORAL

## 2015-04-09 MED ORDER — ASPIRIN 81 MG PO CHEW
CHEWABLE_TABLET | ORAL | Status: AC
  Filled 2015-04-09: qty 4

## 2015-04-09 MED ORDER — HEPARIN (PORCINE) IN NACL 2-0.9 UNIT/ML-% IJ SOLN
INTRAMUSCULAR | Status: AC
Start: 2015-04-09 — End: 2015-04-09
  Filled 2015-04-09: qty 1000

## 2015-04-09 MED ORDER — ASPIRIN 81 MG PO CHEW: 81.0000 mg | CHEWABLE_TABLET | ORAL | Status: DC

## 2015-04-09 MED ORDER — BIVALIRUDIN 250 MG IV SOLR
INTRAVENOUS | Status: AC
  Filled 2015-04-09: qty 250

## 2015-04-09 MED ORDER — SODIUM CHLORIDE 0.9 % WEIGHT BASED INFUSION: 3.0000 mL/kg/h | INTRAVENOUS | Status: DC

## 2015-04-09 MED ORDER — MIDAZOLAM HCL 2 MG/2ML IJ SOLN
INTRAMUSCULAR | Status: DC | PRN
  Administered 2015-04-09 (×4): 1 mg via INTRAVENOUS

## 2015-04-09 MED ORDER — FENTANYL CITRATE (PF) 100 MCG/2ML IJ SOLN
INTRAMUSCULAR | Status: DC | PRN
  Administered 2015-04-09 (×5): 50 ug via INTRAVENOUS

## 2015-04-09 MED ORDER — IOHEXOL 300 MG/ML  SOLN
INTRAMUSCULAR | Status: DC | PRN
  Administered 2015-04-09: 250 mL via INTRA_ARTERIAL

## 2015-04-09 MED ORDER — MIDAZOLAM HCL 2 MG/2ML IJ SOLN
INTRAMUSCULAR | Status: AC
Start: 2015-04-09 — End: 2015-04-09
  Filled 2015-04-09: qty 2

## 2015-04-09 MED ORDER — SODIUM CHLORIDE 0.9 % IJ SOLN
3.0000 mL | Freq: Two times a day (BID) | INTRAMUSCULAR | Status: DC
  Administered 2015-04-09 (×2): 3 mL via INTRAVENOUS

## 2015-04-09 MED ORDER — HEPARIN SODIUM (PORCINE) 1000 UNIT/ML IJ SOLN
INTRAMUSCULAR | Status: DC | PRN
  Administered 2015-04-09: 3000 [IU] via INTRAVENOUS
  Administered 2015-04-09: 5000 [IU] via INTRAVENOUS

## 2015-04-09 MED ORDER — VERAPAMIL HCL 2.5 MG/ML IV SOLN
INTRAVENOUS | Status: AC
  Filled 2015-04-09: qty 2

## 2015-04-09 MED ORDER — SODIUM CHLORIDE 0.9 % IV SOLN: 250.0000 mL | INTRAVENOUS | Status: DC | PRN

## 2015-04-09 MED ORDER — DIPHENHYDRAMINE HCL 50 MG/ML IJ SOLN
INTRAMUSCULAR | Status: DC | PRN
  Administered 2015-04-09 (×2): 25 mg via INTRAVENOUS

## 2015-04-09 MED ORDER — PNEUMOCOCCAL VAC POLYVALENT 25 MCG/0.5ML IJ INJ: 0.5000 mL | INJECTION | INTRAMUSCULAR | Status: DC

## 2015-04-09 MED ORDER — ASPIRIN 81 MG PO CHEW
CHEWABLE_TABLET | ORAL | Status: DC | PRN
  Administered 2015-04-09: 324 mg via ORAL

## 2015-04-09 MED ORDER — INFLUENZA VAC SPLIT QUAD 0.5 ML IM SUSY: 0.5000 mL | PREFILLED_SYRINGE | INTRAMUSCULAR | Status: DC

## 2015-04-09 SURGICAL SUPPLY — 16 items
BALLN TREK RX 2.25X12 (BALLOONS) ×4
BALLOON TREK RX 2.25X12 (BALLOONS) ×2 IMPLANT
CATH INFINITI 5 FR JL3.5 (CATHETERS) ×4 IMPLANT
CATH OPTITORQUE JACKY 4.0 5F (CATHETERS) ×4 IMPLANT
CATH VISTA GUIDE 6FR JR4 (CATHETERS) ×8 IMPLANT
DEVICE INFLAT 30 PLUS (MISCELLANEOUS) ×8 IMPLANT
DEVICE RAD COMP TR BAND LRG (VASCULAR PRODUCTS) ×4 IMPLANT
GLIDESHEATH SLEND SS 6F .021 (SHEATH) ×8 IMPLANT
KIT MANI 3VAL PERCEP (MISCELLANEOUS) ×4 IMPLANT
PACK CARDIAC CATH (CUSTOM PROCEDURE TRAY) ×4 IMPLANT
STENT RESOLUTE INTEG 2.5X22 (Permanent Stent) ×4 IMPLANT
STENT XIENCE ALPINE RX 2.5X12 (Permanent Stent) ×4 IMPLANT
STENT XIENCE ALPINE RX 2.5X8 (Permanent Stent) IMPLANT
WIRE HITORQ VERSACORE ST 145CM (WIRE) ×8 IMPLANT
WIRE RUNTHROUGH .014X180CM (WIRE) ×8 IMPLANT
WIRE SAFE-T 1.5MM-J .035X260CM (WIRE) ×8 IMPLANT

## 2015-04-09 NOTE — Progress Notes (Signed)
Per patient request, called and updated patient's mom on current status and transfer to ICU-stepdown.

## 2015-04-09 NOTE — Progress Notes (Signed)
[name] at [place] NAME: Larry French    MR#:  [id number]  DATE OF BIRTH:  13-Oct-1960  SUBJECTIVE:   Patient here due to a seizure episode secondary from alcohol/benzodiazepine withdrawal. Had stress test 12/20 which was + and therefore underwent cardiac catheterization today and status post stent to the RCA. Seen in specials recovery and currently chest pain-free.  REVIEW OF SYSTEMS:    Review of Systems  Constitutional: Negative for fever and chills.  HENT: Negative for congestion and tinnitus.   Eyes: Negative for blurred vision and double vision.  Respiratory: Negative for cough, shortness of breath and wheezing.   Cardiovascular: Negative for chest pain, orthopnea and PND.  Gastrointestinal: Negative for nausea, vomiting, abdominal pain and diarrhea.  Genitourinary: Negative for dysuria and hematuria.  Neurological: Negative for dizziness, sensory change and focal weakness.  Psychiatric/Behavioral: The patient is not nervous/anxious.   All other systems reviewed and are negative.   Nutrition: Regular Tolerating Diet: Yes Tolerating PT: Ambulatory  DRUG ALLERGIES:  No Known Allergies  VITALS:  Blood pressure 142/84, pulse 65, temperature 97.6 F (36.4 C), temperature source Oral, resp. rate 18, height 5\' 9"  (1.753 m), weight 90.719 kg (200 lb), SpO2 98 %.  PHYSICAL EXAMINATION:   Physical Exam  GENERAL:  54 y.o.-year-old patient lying in the bed with no acute distress.  EYES: Pupils equal, round, reactive to light and accommodation. No scleral icterus. Extraocular muscles intact.  HEENT: Head atraumatic, normocephalic. Oropharynx and nasopharynx clear.  NECK:  Supple, no jugular venous distention. No thyroid enlargement, no tenderness.  LUNGS: Normal breath sounds bilaterally, no wheezing, rales, rhonchi. No use of accessory muscles of respiration.  CARDIOVASCULAR: S1, S2 normal. No murmurs, rubs, or gallops.   ABDOMEN: Soft, nontender, nondistended. Bowel sounds present. No organomegaly or mass.  EXTREMITIES: No cyanosis, clubbing or edema b/l.    NEUROLOGIC: Cranial nerves II through XII are intact. No focal Motor or sensory deficits b/l.   PSYCHIATRIC: The patient is alert and oriented x 3. Anxious SKIN: No obvious rash, lesion, or ulcer.    LABORATORY PANEL:   CBC  Recent Labs Lab 04/09/15 0435  WBC 8.9  HGB 13.7  HCT 41.2  PLT 218   ------------------------------------------------------------------------------------------------------------------  Chemistries   Recent Labs Lab 04/05/15 1531 04/06/15 0348  04/09/15 0435  NA 135 140  --   --   K 3.9 3.5  --   --   CL 103 107  --   --   CO2 18* 25  --   --   GLUCOSE 100* 103*  --   --   BUN 6 5*  --   --   CREATININE 1.01 0.83  < > 0.71  CALCIUM 10.0 8.9  --   --   AST 38  --   --   --   ALT 52  --   --   --   ALKPHOS 113  --   --   --   BILITOT 0.7  --   --   --   < > = values in this interval not displayed. ------------------------------------------------------------------------------------------------------------------  Cardiac Enzymes  Recent Labs Lab 04/06/15 0836  TROPONINI 2.84*   ------------------------------------------------------------------------------------------------------------------  RADIOLOGY:  Nm Myocar Multi W/spect W/wall Motion / Ef  04/08/2015   There was no ST segment deviation noted during stress.  Pharmacological  myocardial perfusion imaging study with moderate sized region of ischemia noted in the  basal to mid inferior  and inferolateral wall This is a moderate sized region of moderate intensity perfusion defect noted in the basal to mid inferior and inferolateral wall Concern for hypokinesis in the basal inferior region, EF estimated at 49% No EKG changes concerning for ischemia. Moderate  risk scan Clinical correlation suggested. Signed, Esmond Plants, MD [facility]      ASSESSMENT AND PLAN:   54 year old male with past medical history of anxiety, alcohol abuse, hx of coronary artery disease, PTSD, hypertension, hyperlipidemia, depression who presented to the hospital due to seizures.  #1 seizures-this was likely due to alcohol withdrawal/withdrawal from benzodiazepines. -Patient apparently was getting detox for benzo abuse and left early from [facility] and was drinking to help with his anxiety.  He stopped ETOH abuse 2 days prior to admission.   - no further evidence of seizures since in the hospital.    - cont. CIWA, Ativan PRN.   #2 alcohol abuse/withdrawal-continue CIWA protocol. -Continue thiamine, folate. - improved and no evidence of withdrawal presently.   #3 NSTEMI-patient has no acute chest pain. His troponin's trended upward -Echo yesterday showed normal EF.  Had stress test 12/20 which is abnormal showing a moderate sized region of moderate intensity perfusion defect noted in the basal to mid inferior and inferolateral wall  Concern for hypokinesis in the basal inferior region, EF estimated at 49% -Status post cardiac catheterization today which showed significant three-vessel coronary disease with patent stent in the LAD. He had a 90% stenosis in the proximal to mid RCA which was intervened on with a stent. He had successful angioplasty and drug-eluting stent to the right coronary. It was complicated with a V. fib arrest which was treated with successful defibrillation. -Continue aspirin, Plavix, beta blocker, losartan, simvastatin  #4 hyperlipidemia-continue simvastatin.  #5 tobacco abuse-continue nicotine patch.  #6 hypertension-continue metoprolol, losartan.  #7 anxiety/benzodiazepine withdrawal-appreciate Psych consult.   - cont. Librium, Seroquel, Buspar and will follow with [facility] as outpatient.    All the records are reviewed and case discussed with Care Management/Social Worker. Management plans discussed with the patient, family  and they are in agreement.  CODE STATUS: Full  DVT Prophylaxis: Lovenox   TOTAL TIME TAKING CARE OF THIS PATIENT: 35 minutes.   POSSIBLE D/C IN 1-2 DAYS, DEPENDING ON CLINICAL CONDITION.   Henreitta Leber M.D on 04/09/2015 at 3:23 PM  Between 7am to 6pm - Pager - [phone]  After 6pm go to [email or web address] - password [email or web address] [facility]  Office  [phone]  CC: Primary care physician; Pcp Not In System

## 2015-04-09 NOTE — Interval H&P Note (Signed)
History and Physical Interval Note:  04/09/2015 8:53 AM  Larry French  has presented today for surgery, with the diagnosis of NSTEMI  The various methods of treatment have been discussed with the patient and family. After consideration of risks, benefits and other options for treatment, the patient has consented to  Procedure(s): Left Heart Cath and Coronary Angiography (Bilateral) as a surgical intervention .  The patient's history has been reviewed, patient examined, no change in status, stable for surgery.  I have reviewed the patient's chart and labs.  Questions were answered to the patient's satisfaction.     [name]

## 2015-04-09 NOTE — OR Nursing (Signed)
Patient requesting medications, I need my Ativan, Librium and oxycodone...pharmancy called

## 2015-04-09 NOTE — H&P (View-Only) (Signed)
Patient: Larry French / Admit Date: 04/05/2015 / Date of Encounter: 04/08/2015, 8:48 PM   Subjective: Denies any chest pain today Stress test showing ischemia in the inferolateral region Mildly depressed ejection fraction Patient arguing with parents over the phone, significant issues concerning the patient's drug abuse/substance abuse. Father has expressed over the phone that he does not want him coming home And wonders if I have some place to "put him"   Review of Systems: Review of Systems  Constitutional: Negative.   Respiratory: Negative.   Cardiovascular: Negative.   Gastrointestinal: Negative.   Musculoskeletal: Negative.   Neurological: Negative.   Psychiatric/Behavioral: Negative.   All other systems reviewed and are negative.   Objective: Telemetry: nsr Physical Exam: Blood pressure 169/95, pulse 74, temperature 98.1 F (36.7 C), temperature source Oral, resp. rate 20, height 5\' 9"  (1.753 m), weight 200 lb (90.719 kg), SpO2 97 %. Body mass index is 29.52 kg/(m^2). General: Well developed, well nourished, in no acute distress. Head: Normocephalic, atraumatic, sclera non-icteric, no xanthomas, nares are without discharge. Neck: Negative for carotid bruits. JVP not elevated. Lungs: Clear bilaterally to auscultation without wheezes, rales, or rhonchi. Breathing is unlabored. Heart: RRR S1 S2 without murmurs, rubs, or gallops.  Abdomen: Soft, non-tender, non-distended with normoactive bowel sounds. No rebound/guarding. Extremities: No clubbing or cyanosis. No edema. Distal pedal pulses are 2+ and equal bilaterally. Neuro: Alert and oriented X 3. Moves all extremities spontaneously. Psych:  Responds to questions appropriately with a normal affect.   Intake/Output Summary (Last 24 hours) at 04/08/15 2048 Last data filed at 04/08/15 1828  Gross per 24 hour  Intake   1725 ml  Output   3150 ml  Net  -1425 ml    Inpatient Medications:  . aspirin EC  81 mg  Oral Daily  . busPIRone  5 mg Oral BID  . chlordiazePOXIDE  25 mg Oral TID  . clopidogrel  75 mg Oral Daily  . enoxaparin (LOVENOX) injection  1 mg/kg Subcutaneous Q12H  . folic acid  1 mg Oral Daily  . losartan  25 mg Oral Daily  . metoprolol tartrate  25 mg Oral BID  . multivitamin with minerals  1 tablet Oral Daily  . nicotine  21 mg Transdermal Daily  . QUEtiapine  100 mg Oral QHS  . simvastatin  40 mg Oral BH-q7a  . sodium chloride  3 mL Intravenous Q12H  . thiamine  100 mg Intravenous Daily  . thiamine  100 mg Oral Daily  . Vortioxetine HBr  10 mg Oral Daily   Infusions:  . sodium chloride 75 mL/hr at 04/08/15 1451    Labs:  Recent Labs  04/06/15 0348 04/08/15 0613  NA 140  --   K 3.5  --   CL 107  --   CO2 25  --   GLUCOSE 103*  --   BUN 5*  --   CREATININE 0.83 0.87  CALCIUM 8.9  --    No results for input(s): AST, ALT, ALKPHOS, BILITOT, PROT, ALBUMIN in the last 72 hours.  Recent Labs  04/06/15 0348 04/08/15 0613  WBC 11.9* 8.7  HGB 14.4 13.7  HCT 43.2 42.0  MCV 98.7 98.8  PLT 208 210    Recent Labs  04/05/15 2125 04/06/15 0348 04/06/15 0836  TROPONINI 2.94* 3.38* 2.84*   Invalid input(s): POCBNP  Recent Labs  04/06/15 0348  HGBA1C 5.0     Weights: Filed Weights   04/05/15 1526  Weight: 200 lb (  90.719 kg)     Radiology/Studies:  Dg Lumbar Spine Complete  04/05/2015  CLINICAL DATA:  Low back pain today, no known injury EXAM: LUMBAR SPINE - COMPLETE 4+ VIEW COMPARISON:  None FINDINGS: Osseous demineralization. Five non-rib-bearing lumbar vertebra. Multilevel disc space narrowing and endplate spur formation greatest at L4-L5. Superior endplate of compression fracture of L1 vertebral body, appears acute, with approximately 30% anterior height loss. No additional fracture, subluxation or bone destruction. No spondylolysis. SI joints symmetric. IMPRESSION: Acute appearing superior endplate compression fracture of L1 vertebral body with  approximately 30% anterior height loss. Osseous demineralization with multilevel degenerative disc disease changes lumbar spine. Electronically Signed   By: Lavonia Dana M.D.   On: 04/05/2015 20:04   Ct Head Wo Contrast  04/05/2015  CLINICAL DATA:  New onset seizure today. EXAM: CT HEAD WITHOUT CONTRAST TECHNIQUE: Contiguous axial images were obtained from the base of the skull through the vertex without intravenous contrast. COMPARISON:  Head CT 11/24/2013 FINDINGS: No intracranial hemorrhage, mass effect, or midline shift. Chronic small vessel ischemia and remote lacunar infarcts in the left basal ganglia, unchanged. No hydrocephalus. The basilar cisterns are patent. No evidence of territorial infarct. No intracranial fluid collection. Atherosclerosis noted of skullbase vasculature, advanced for age. Calvarium is intact. Sclerotic density in the right parietal bone, unchanged from prior. Included paranasal sinuses and mastoid air cells are well aerated. 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 No CT findings of acute intracranial abnormality. Electronically Signed   By: Jeb Levering M.D.   On: 04/05/2015 18:25   Nm Myocar Multi W/spect W/wall Motion / Ef  04/08/2015   There was no ST segment deviation noted during stress.  Pharmacological  myocardial perfusion imaging study with moderate sized region of ischemia noted in the  basal to mid inferior and inferolateral wall This is a moderate sized region of moderate intensity perfusion defect noted in the basal to mid inferior and inferolateral wall Concern for hypokinesis in the basal inferior region, EF estimated at 49% No EKG changes concerning for ischemia. Moderate  risk scan Clinical correlation suggested. Signed, Esmond Plants, MD [facility]   Dg Chest Portable 1 View  04/05/2015  CLINICAL DATA:  Seizure today, shortness of breath, EKG changes, coronary artery disease post MI,  hypertension, smoker EXAM: PORTABLE CHEST 1 VIEW COMPARISON:  Portable exam 1715 hours compared to 03/07/2014 FINDINGS: Upper normal heart size. Mediastinal contours and pulmonary vascularity normal. Lungs clear. No pleural effusion or pneumothorax. No acute osseous findings. IMPRESSION: No acute abnormalities. Electronically Signed   By: Lavonia Dana M.D.   On: 04/05/2015 17:47     Assessment and Plan  54 y.o. male   54 year old male with past medical history of anxiety, alcohol abuse, hx of coronary artery disease, PTSD, hypertension, hyperlipidemia, depression who presented to the hospital due to seizures.   1. Elevated troponin: Non-STEMI Stress test today showing ischemia in the basal to mid inferior and inferolateral wall Scheduled for cardiac catheterization tomorrow Risk and benefit discussed with the patient, including risk of stroke, heart attack, he is willing to proceed Scheduled for second case tomorrow Orders have been placed Discussed with his parents over the phone  2. CAD s/p PCI as above: -Continue aspirin and Plavix -Lopressor 25 mg bid and simvastatin 40 mg qhs  3. Seizures: -CIWA -Per IM  4. Polysubstance abuse: -Thiamine and folate -Per IM  5. HLD: -Simvastatin as above -FLP pending  6. Anxiety: -Psych  on board  Father expressed over the phone that he does not want to have anything else to do with his son given his alcohol and substance abuse issues. He would not be available to pick up his son, does not want him coming back home.   Signed, Esmond Plants, MD [facility] 04/08/2015, 8:48 PM

## 2015-04-09 NOTE — Progress Notes (Signed)
Patient has rested quietly tonight. Multiple complaints of pain acknowledged and managed with PRN medications; no signs of discomfort or distress noted. Patient has remained NPO for cardiac cath scheduled for today. Nursing staff will continue to monitor. Earleen Reaper, RN

## 2015-04-09 NOTE — Progress Notes (Signed)
Patient is alert and oriented. Reporting tolerable pain control after PRN medication administration in recovery. Right wrist radial site wnl. TR band deflated and gauze/tegaderm applied. Report called to [facility] Will transport shortly.

## 2015-04-09 NOTE — Care Management (Signed)
Admitted to icu s/p stent RCA.  Anticipating need for assist with Plavix at discharge.  CSW informed that it is reported that patient's father has said patient can not return to father's home at [place].

## 2015-04-10 ENCOUNTER — Telehealth: Payer: Self-pay

## 2015-04-10 LAB — BASIC METABOLIC PANEL
Anion gap: 6 (ref 5–15)
CHLORIDE: 106 mmol/L (ref 101–111)
CO2: 26 mmol/L (ref 22–32)
CREATININE: 0.83 mg/dL (ref 0.61–1.24)
Calcium: 8.7 mg/dL — ABNORMAL LOW (ref 8.9–10.3)
GFR calc Af Amer: >60 mL/min (ref >60)
Glucose, Bld: 101 mg/dL — ABNORMAL HIGH (ref 65–99)
Potassium: 3.5 mmol/L (ref 3.5–5.1)
SODIUM: 138 mmol/L (ref 135–145)

## 2015-04-10 LAB — CBC
HCT: 43.3 % (ref 40.0–52.0)
Hemoglobin: 14.2 g/dL (ref 13.0–18.0)
MCH: 32.6 pg (ref 26.0–34.0)
MCHC: 32.9 g/dL (ref 32.0–36.0)
MCV: 99.1 fL (ref 80.0–100.0)
PLATELETS: 257 10*3/uL (ref 150–440)
RBC: 4.36 MIL/uL — ABNORMAL LOW (ref 4.40–5.90)
RDW: 18.4 % — AB (ref 11.5–14.5)
WBC: 9.4 10*3/uL (ref 3.8–10.6)

## 2015-04-10 MED ORDER — BUSPIRONE HCL 5 MG PO TABS: 10.0000 mg | ORAL_TABLET | Freq: Two times a day (BID) | ORAL | Status: DC

## 2015-04-10 MED ORDER — CLOPIDOGREL BISULFATE 75 MG PO TABS: 75.0000 mg | ORAL_TABLET | Freq: Every day | ORAL | Status: DC

## 2015-04-10 MED ORDER — METOPROLOL TARTRATE 25 MG PO TABS: 25.0000 mg | ORAL_TABLET | Freq: Two times a day (BID) | ORAL | Status: DC

## 2015-04-10 MED ORDER — SIMVASTATIN 40 MG PO TABS: 40.0000 mg | ORAL_TABLET | ORAL | Status: DC

## 2015-04-10 MED ORDER — LOSARTAN POTASSIUM 25 MG PO TABS: 25.0000 mg | ORAL_TABLET | Freq: Every day | ORAL | Status: DC

## 2015-04-10 MED ORDER — CHLORDIAZEPOXIDE HCL 25 MG PO CAPS: 25.0000 mg | ORAL_CAPSULE | Freq: Every day | ORAL | Status: DC

## 2015-04-10 NOTE — Clinical Social Work Note (Signed)
CSW was reconsulted for same reason as previous consult. CSW has seen and completed assessment. CSW signing off. Shela Leff MSW,LCSW [phone]

## 2015-04-10 NOTE — Progress Notes (Signed)
Pt d/c home per MD order, pt VSS, d/c instructions given, prescriptions given,pt verbalized understanding of d/c, spiritual care communicated with pt about d/c, all questions answered

## 2015-04-10 NOTE — Discharge Summary (Signed)
Larry French at [place] NAME: Larry French    MR#:  [id number]  DATE OF BIRTH:  [birth year]  DATE OF ADMISSION:  04/05/2015 ADMITTING PHYSICIAN: Dustin Flock, MD  DATE OF DISCHARGE: 04/10/2015 10:30 AM  PRIMARY CARE PHYSICIAN: Pcp Not In System    ADMISSION DIAGNOSIS:  Seizure ([place]) [R56.9] Abnormal EKG [R94.31] Elevated troponin [R79.89] Benzodiazepine withdrawal, with unspecified complication ([place]) [id number]  DISCHARGE DIAGNOSIS:  Principal Problem:   Withdrawal from benzodiazepine [facility]) Active Problems:   Elevated troponin   Abnormal EKG   Benzodiazepine withdrawal (HCC)   NSTEMI (non-ST elevated myocardial infarction) ([place])   Positive cardiac stress test   SECONDARY DIAGNOSIS:   Past Medical History  Diagnosis Date  . Alcohol abuse     Recovered x 15 months  . CA - skin cancer   . Mental disorder   . Headache(784.0)   . Anxiety   . Depression   . CAD, residual CFX LAD disease 06/01/2011    a. s/p inferior ST elevation MI s/p PCI/DES to RCA on 05/29/2011; b. residual LAD and LCx CAD tx'd on 06/02/11 cath with LAD CAD successfully tx'd w/ PCI/DES, LCx managaged medically   . Hypertension   . Hyperlipemia   . PTSD (post-traumatic stress disorder)   . Polysubstance abuse     a. etoh, xanax, tobacco    HOSPITAL COURSE:   54 year old male with past medical history of anxiety, alcohol abuse, hx of coronary artery disease, PTSD, hypertension, hyperlipidemia, depression who presented to the hospital due to seizures.  #1 seizures-this was likely due to alcohol withdrawal/withdrawal from benzodiazepines. -Patient apparently was getting detox for benzo abuse and left early from [facility] and was drinking to help with his anxiety. He stopped ETOH abuse 2 days prior to admission.  -While in the hospital he was given some as needed Ativan and maintained on CIWA protocol. He has been seizure-free since being in the hospital  and therefore being discharged home.  #2 alcohol abuse/withdrawal-this has resolved now. He has had no further evidence of seizures. He was strongly advised to abstain from alcohol abuse.  #3 NSTEMI-this was based on elevated troponin. Patient acutely himself had no chest pain while in the hospital. -his Echo showed normal EF although he had a stress test 12/20 which was abnormal showing a moderate sized region of moderate intensity perfusion defect noted in the basal to mid inferior and inferolateral wall  Concern for hypokinesis in the basal inferior region, EF estimated at 49% -Cardiology was consulted and he underwent cardiac catheterization 12/21 which showed significant three-vessel coronary disease with patent stent in the LAD. He had a 90% stenosis in the proximal to mid RCA which was intervened on with a stent. He had successful angioplasty and drug-eluting stent to the right coronary. It was complicated with a V. fib arrest which was treated with successful defibrillation. -Post cardiac catheterization patient has had no chest pain and has had no further evidence of arrhythmia. -he will Continue aspirin, Plavix, beta blocker, losartan, simvastatin and follow up with cardiology as an outpatient in the next 2 weeks.  #4 hyperlipidemia-he will continue simvastatin.  #5 hypertension-he will continue metoprolol, losartan.  #6 anxiety/benzodiazepine withdrawal-appreciate Psych consult.  -Patient was started on Librium and he is being discharged on that for a few days. He was also started on BuSpar which she has also been given a prescription for. He was already on Seroquel and he will continue that - he will  [facility] with [facility] as outpatient  DISCHARGE CONDITIONS:   Stable  CONSULTS OBTAINED:  Treatment Team:  Dustin Flock, MD Donita Brooks, MD  DRUG ALLERGIES:  No Known Allergies  DISCHARGE MEDICATIONS:   Discharge Medication List as of 04/10/2015 10:02 AM     START taking these medications   Details  aspirin EC 81 MG EC tablet Take 1 tablet (81 mg total) by mouth daily., Starting 04/08/2015, Until Discontinued, No Print      CONTINUE these medications which have CHANGED   Details  busPIRone (BUSPAR) 5 MG tablet Take 2 tablets (10 mg total) by mouth 2 (two) times daily., Starting 04/10/2015, Until Discontinued, Print    chlordiazePOXIDE (LIBRIUM) 25 MG capsule Take 1 capsule (25 mg total) by mouth daily., Starting 04/10/2015, Until Discontinued, Print    !! clopidogrel (PLAVIX) 75 MG tablet Take 1 tablet (75 mg total) by mouth daily., Starting 04/10/2015, Until Discontinued, Print    !! losartan (COZAAR) 25 MG tablet Take 1 tablet (25 mg total) by mouth daily., Starting 04/10/2015, Until Discontinued, Print    !! metoprolol tartrate (LOPRESSOR) 25 MG tablet Take 1 tablet (25 mg total) by mouth 2 (two) times daily., Starting 04/10/2015, Until Discontinued, Print    !! simvastatin (ZOCOR) 40 MG tablet Take 1 tablet (40 mg total) by mouth every morning., Starting 04/10/2015, Until Discontinued, Print     !! - Potential duplicate medications found. Please discuss with provider.    CONTINUE these medications which have NOT CHANGED   Details  !! clopidogrel (PLAVIX) 75 MG tablet Take 75 mg by mouth daily., Until Discontinued, Historical Med    !! losartan (COZAAR) 50 MG tablet Take 25 mg by mouth daily., Until Discontinued, Historical Med    !! metoprolol tartrate (LOPRESSOR) 25 MG tablet Take 25 mg by mouth 2 (two) times daily., Until Discontinued, Historical Med    nitroGLYCERIN (NITROSTAT) 0.4 MG SL tablet Place 0.4 mg under the tongue every 5 (five) minutes as needed for chest pain., Until Discontinued, Historical Med    QUEtiapine (SEROQUEL) 100 MG tablet Take 200 mg by mouth at bedtime. , Until Discontinued, Historical Med    !! simvastatin (ZOCOR) 40 MG tablet Take 40 mg by mouth every morning., Until Discontinued, Historical Med      !! - Potential duplicate medications found. Please discuss with provider.    STOP taking these medications     Vortioxetine HBr (TRINTELLIX) 10 MG TABS          DISCHARGE INSTRUCTIONS:   DIET:  Cardiac diet  DISCHARGE CONDITION:  Stable  ACTIVITY:  Activity as tolerated  OXYGEN:  Home Oxygen: No.   Oxygen Delivery: room air  DISCHARGE LOCATION:  home   If you experience worsening of your admission symptoms, develop shortness of breath, life threatening emergency, suicidal or homicidal thoughts you must seek medical attention immediately by calling 911 or calling your MD immediately  if symptoms less severe.  You Must read complete instructions/literature along with all the possible adverse reactions/side effects for all the Medicines you take and that have been prescribed to you. Take any new Medicines after you have completely understood and accpet all the possible adverse reactions/side effects.   Please note  You were cared for by a hospitalist during your hospital stay. If you have any questions about your discharge medications or the care you received while you were in the hospital after you are discharged, you can call the unit and asked to speak with  the hospitalist on call if the hospitalist that took care of you is not available. Once you are discharged, your primary care physician will handle any further medical issues. Please note that NO REFILLS for any discharge medications will be authorized once you are discharged, as it is imperative that you return to your primary care physician (or establish a relationship with a primary care physician if you do not have one) for your aftercare needs so that they can reassess your need for medications and monitor your lab values.     Today   No Chest pain, nausea, vomiting, shortness of breath. Feels a bit anxious  VITAL SIGNS:  Blood pressure 150/91, pulse 90, temperature 98.2 F (36.8 C), temperature source Oral,  resp. rate 25, height 5\' 9"  (1.753 m), weight 90.719 kg (200 lb), SpO2 91 %.  I/O:   Intake/Output Summary (Last 24 hours) at 04/10/15 1638 Last data filed at 04/10/15 1000  Gross per 24 hour  Intake 2296.67 ml  Output   2450 ml  Net -153.33 ml    PHYSICAL EXAMINATION:  GENERAL:  54 y.o.-year-old patient lying in the bed with no acute distress.  EYES: Pupils equal, round, reactive to light and accommodation. No scleral icterus. Extraocular muscles intact.  HEENT: Head atraumatic, normocephalic. Oropharynx and nasopharynx clear.  NECK:  Supple, no jugular venous distention. No thyroid enlargement, no tenderness.  LUNGS: Normal breath sounds bilaterally, no wheezing, rales,rhonchi. No use of accessory muscles of respiration.  CARDIOVASCULAR: S1, S2 normal. No murmurs, rubs, or gallops.  ABDOMEN: Soft, non-tender, non-distended. Bowel sounds present. No organomegaly or mass.  EXTREMITIES: No pedal edema, cyanosis, or clubbing.  NEUROLOGIC: Cranial nerves II through XII are intact. No focal motor or sensory defecits b/l.  PSYCHIATRIC: The patient is alert and oriented x 3. Good affect.  SKIN: No obvious rash, lesion, or ulcer.   DATA REVIEW:   CBC  Recent Labs Lab 04/10/15 0418  WBC 9.4  HGB 14.2  HCT 43.3  PLT 257    Chemistries   Recent Labs Lab 04/05/15 1531  04/10/15 0418  NA 135  < > 138  K 3.9  < > 3.5  CL 103  < > 106  CO2 18*  < > 26  GLUCOSE 100*  < > 101*  BUN 6  < > <5*  CREATININE 1.01  < > 0.83  CALCIUM 10.0  < > 8.7*  AST 38  --   --   ALT 52  --   --   ALKPHOS 113  --   --   BILITOT 0.7  --   --   < > = values in this interval not displayed.  Cardiac Enzymes  Recent Labs Lab 04/06/15 0836  TROPONINI 2.84*    Microbiology Results  Results for orders placed or performed during the hospital encounter of 04/05/15  MRSA PCR Screening     Status: None   Collection Time: 04/05/15  4:35 AM  Result Value Ref Range Status   MRSA by PCR NEGATIVE  NEGATIVE Final    Comment:        The GeneXpert MRSA Assay (FDA approved for NASAL specimens only), is one component of a comprehensive MRSA colonization surveillance program. It is not intended to diagnose MRSA infection nor to guide or monitor treatment for MRSA infections.     RADIOLOGY:  No results found.    Management plans discussed with the patient, family and they are in agreement.  CODE STATUS:  TOTAL TIME TAKING CARE OF THIS PATIENT: 40 minutes.    Henreitta Leber M.D on 04/10/2015 at 4:38 PM  Between 7am to 6pm - Pager - [phone]  After 6pm go to [email or web address] - password [email or web address] [facility]  Office  [phone]  CC: Primary care physician; Pcp Not In System

## 2015-04-10 NOTE — Progress Notes (Signed)
   04/10/15 1000  Clinical Encounter Type  Visited With Patient  Visit Type Initial;Spiritual support  Consult/Referral To Chaplain  Spiritual Encounters  Spiritual Needs Emotional  Stress Factors  Patient Stress Factors Major life changes  Chaplain rounded in the unit and offered a compassionate presence and support to patient as he expressed his excitement for being discharged. Offered a blessing and shared in his excitement. Chaplain Stephenie Navejas A. Derionna Salvador Ext. [phone]

## 2015-04-10 NOTE — Progress Notes (Signed)
Patient: Larry French / Admit Date: 04/05/2015 / Date of Encounter: 04/10/2015, 8:03 AM   Subjective: No chest pain, palpitations, or SOB. Status post cardiac cath on 12/21 in which he underwent PCI/DES x 2 to the RCA complicated by Vfib requiring defibrillation. There was significant underlying 3 vessel CAD with patent patent LAD stent and mid RCA stent. However, there was severe disease affecting the proximal to mid RCA as above as well as the mid LCx which was new. Normal LVSF by noninvasive imaging.    Cath details:  Mid RCA lesion, 10% stenosed. The lesion was previously treated with a drug-eluting stent greater than two years ago.  Dist RCA lesion, 50% stenosed.  Prox Cx to Mid Cx lesion, 90% stenosed.  Mid Cx lesion, 80% stenosed.  Prox LAD lesion, 20% stenosed.  Ost LM to LM lesion, 20% stenosed.  Prox RCA to Mid RCA lesion, 90% stenosed. Post intervention, there is a 0% residual stenosis.  Review of Systems: Review of Systems  Constitutional: Positive for malaise/fatigue. Negative for fever, chills, weight loss and diaphoresis.  HENT: Negative for congestion.   Eyes: Negative for discharge and redness.  Respiratory: Negative for cough, hemoptysis, sputum production, shortness of breath and wheezing.   Cardiovascular: Negative for palpitations, orthopnea, claudication, leg swelling and PND.  Gastrointestinal: Negative for nausea and vomiting.  Musculoskeletal: Negative for falls.  Skin: Negative for rash.  Neurological: Negative for dizziness, tingling, tremors, sensory change, speech change, focal weakness, loss of consciousness and weakness.  Endo/Heme/Allergies: Does not bruise/bleed easily.  Psychiatric/Behavioral: Positive for substance abuse. Negative for suicidal ideas. The patient is nervous/anxious.   .   Objective: Telemetry: NSR, 60's Physical Exam: Blood pressure 167/96, pulse 74, temperature 98.1 F (36.7 C), temperature source Oral, resp.  rate 20, height 5\' 9"  (1.753 m), weight 200 lb (90.719 kg), SpO2 99 %. Body mass index is 29.52 kg/(m^2). General: Well developed, well nourished, in no acute distress. Head: Normocephalic, atraumatic, sclera non-icteric, no xanthomas, nares are without discharge. Neck: Negative for carotid bruits. JVP not elevated. Lungs: Clear bilaterally to auscultation without wheezes, rales, or rhonchi. Breathing is unlabored. Heart: RRR S1 S2 without murmurs, rubs, or gallops.  Abdomen: Soft, non-tender, non-distended with normoactive bowel sounds. No rebound/guarding. Extremities: No clubbing or cyanosis. No edema. Distal pedal pulses are 2+ and equal bilaterally. Cath site without TTP, swelling, erythema, bruising, or bleeding.  Neuro: Alert and oriented X 3. Moves all extremities spontaneously. Psych:  Responds to questions appropriately with a normal affect.   Intake/Output Summary (Last 24 hours) at 04/10/15 0803 Last data filed at 04/10/15 0600  Gross per 24 hour  Intake 2051.67 ml  Output   2950 ml  Net -898.33 ml    Inpatient Medications:  . aspirin EC  81 mg Oral Daily  . busPIRone  5 mg Oral BID  . chlordiazePOXIDE  25 mg Oral Daily  . clopidogrel  75 mg Oral Daily  . folic acid  1 mg Oral Daily  . Influenza vac split quadrivalent PF  0.5 mL Intramuscular Tomorrow-1000  . losartan  25 mg Oral Daily  . metoprolol tartrate  25 mg Oral BID  . multivitamin with minerals  1 tablet Oral Daily  . nicotine  21 mg Transdermal Daily  . pneumococcal 23 valent vaccine  0.5 mL Intramuscular Tomorrow-1000  . QUEtiapine  100 mg Oral QHS  . simvastatin  40 mg Oral [id number]  . sodium chloride  3 mL Intravenous Q12H  .  sodium chloride  3 mL Intravenous Q12H  . thiamine  100 mg Intravenous Daily  . thiamine  100 mg Oral Daily  . Vortioxetine HBr  10 mg Oral Daily   Infusions:  . sodium chloride 75 mL/hr at 04/09/15 2235    Labs:  Recent Labs  04/09/15 0435 04/10/15 0418  NA  --  138  K   --  3.5  CL  --  106  CO2  --  26  GLUCOSE  --  101*  BUN  --  <5*  CREATININE 0.71 0.83  CALCIUM  --  8.7*   No results for input(s): AST, ALT, ALKPHOS, BILITOT, PROT, ALBUMIN in the last 72 hours.  Recent Labs  04/09/15 0435 04/10/15 0418  WBC 8.9 9.4  HGB 13.7 14.2  HCT 41.2 43.3  MCV 98.7 99.1  PLT 218 257   No results for input(s): CKTOTAL, CKMB, TROPONINI in the last 72 hours. Invalid input(s): POCBNP No results for input(s): HGBA1C in the last 72 hours.   Weights: Filed Weights   04/05/15 1526  Weight: 200 lb (90.719 kg)     Radiology/Studies:  Dg Lumbar Spine Complete  04/05/2015  CLINICAL DATA:  Low back pain today, no known injury EXAM: LUMBAR SPINE - COMPLETE 4+ VIEW COMPARISON:  None FINDINGS: Osseous demineralization. Five non-rib-bearing lumbar vertebra. Multilevel disc space narrowing and endplate spur formation greatest at L4-L5. Superior endplate of compression fracture of L1 vertebral body, appears acute, with approximately 30% anterior height loss. No additional fracture, subluxation or bone destruction. No spondylolysis. SI joints symmetric. IMPRESSION: Acute appearing superior endplate compression fracture of L1 vertebral body with approximately 30% anterior height loss. Osseous demineralization with multilevel degenerative disc disease changes lumbar spine. Electronically Signed   By: Lavonia Dana M.D.   On: 04/05/2015 20:04   Ct Head Wo Contrast  04/05/2015  CLINICAL DATA:  New onset seizure today. EXAM: CT HEAD WITHOUT CONTRAST TECHNIQUE: Contiguous axial images were obtained from the base of the skull through the vertex without intravenous contrast. COMPARISON:  Head CT 11/24/2013 FINDINGS: No intracranial hemorrhage, mass effect, or midline shift. Chronic small vessel ischemia and remote lacunar infarcts in the left basal ganglia, unchanged. No hydrocephalus. The basilar cisterns are patent. No evidence of territorial infarct. No intracranial fluid  collection. Atherosclerosis noted of skullbase vasculature, advanced for age. Calvarium is intact. Sclerotic density in the right parietal bone, unchanged from prior. Included paranasal sinuses and mastoid air cells are well aerated. 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 No CT findings of acute intracranial abnormality. Electronically Signed   By: Jeb Levering M.D.   On: 04/05/2015 18:25   Nm Myocar Multi W/spect W/wall Motion / Ef  04/08/2015   There was no ST segment deviation noted during stress.  Pharmacological  myocardial perfusion imaging study with moderate sized region of ischemia noted in the  basal to mid inferior and inferolateral wall This is a moderate sized region of moderate intensity perfusion defect noted in the basal to mid inferior and inferolateral wall Concern for hypokinesis in the basal inferior region, EF estimated at 49% No EKG changes concerning for ischemia. Moderate  risk scan Clinical correlation suggested. Signed, Esmond Plants, MD [facility]   Dg Chest Portable 1 View  04/05/2015  CLINICAL DATA:  Seizure today, shortness of breath, EKG changes, coronary artery disease post MI, hypertension, smoker EXAM: PORTABLE CHEST 1 VIEW COMPARISON:  Portable exam 1715 hours compared to  03/07/2014 FINDINGS: Upper normal heart size. Mediastinal contours and pulmonary vascularity normal. Lungs clear. No pleural effusion or pneumothorax. No acute osseous findings. IMPRESSION: No acute abnormalities. Electronically Signed   By: Lavonia Dana M.D.   On: 04/05/2015 17:47     Assessment and Plan   1. NSTEMI/CAD s/p PCI as above: -Continue lifelong DAPT with aspirin and Plavix -He will require staged LCx PCI in the next few weeks as an outpatient with Dr. Fletcher Anon, MD -Lopressor 25 mg bid -Simvastatin 40 mg qhs (question the financial implications of changing to Lipitor in this patient), could change to Lipitor down the  road -Lifestyle modifications -Can transfer to telemetry and get patient ambulating   2. Seizures: -CIWA -Per IM  3. Polysubstance abuse: -Cessation advised  4. HLD: -As above  5. Anxiety: -Doing better  6. Dispo: -Unfortunately he will not be able to live with his parents upon discharge -Case management is working on placement    Signed, Christell Faith, PA-C Pager: [phone] 04/10/2015, 8:03 AM

## 2015-04-10 NOTE — Telephone Encounter (Signed)
Attempted TCM call to pt. Male who answered phone states pt does not live there anymore and has no cell phone. He has no way to be contacted.

## 2015-04-10 NOTE — Care Management (Signed)
Patient confirms to caremanager that he is followed by [facility].  Faxed [facility].  Patient is aware of location and he needs to go to the clinic to pick up his scripts.  Informed patient that clinic does not have the Librium

## 2015-04-16 ENCOUNTER — Emergency Department: Payer: Self-pay

## 2015-04-16 ENCOUNTER — Other Ambulatory Visit: Payer: Self-pay

## 2015-04-16 ENCOUNTER — Emergency Department
Admission: EM | Admit: 2015-04-16 | Discharge: 2015-04-17 | Disposition: A | Discharge status: Other Acute Inpatient Hospital | Payer: Self-pay | Attending: Emergency Medicine | Admitting: Emergency Medicine

## 2015-04-16 DIAGNOSIS — F419 Anxiety disorder, unspecified: Secondary | ICD-10-CM | POA: Insufficient documentation

## 2015-04-16 DIAGNOSIS — R079 Chest pain, unspecified: Secondary | ICD-10-CM

## 2015-04-16 DIAGNOSIS — Z720 Tobacco use: Secondary | ICD-10-CM

## 2015-04-16 DIAGNOSIS — G8929 Other chronic pain: Secondary | ICD-10-CM | POA: Insufficient documentation

## 2015-04-16 DIAGNOSIS — F1310 Sedative, hypnotic or anxiolytic abuse, uncomplicated: Secondary | ICD-10-CM

## 2015-04-16 DIAGNOSIS — I25118 Atherosclerotic heart disease of native coronary artery with other forms of angina pectoris: Secondary | ICD-10-CM | POA: Insufficient documentation

## 2015-04-16 DIAGNOSIS — F1994 Other psychoactive substance use, unspecified with psychoactive substance-induced mood disorder: Secondary | ICD-10-CM

## 2015-04-16 DIAGNOSIS — Z7982 Long term (current) use of aspirin: Secondary | ICD-10-CM | POA: Insufficient documentation

## 2015-04-16 DIAGNOSIS — F4310 Post-traumatic stress disorder, unspecified: Secondary | ICD-10-CM

## 2015-04-16 DIAGNOSIS — F329 Major depressive disorder, single episode, unspecified: Secondary | ICD-10-CM | POA: Insufficient documentation

## 2015-04-16 DIAGNOSIS — F1914 Other psychoactive substance abuse with psychoactive substance-induced mood disorder: Secondary | ICD-10-CM | POA: Insufficient documentation

## 2015-04-16 DIAGNOSIS — I10 Essential (primary) hypertension: Secondary | ICD-10-CM | POA: Insufficient documentation

## 2015-04-16 DIAGNOSIS — Z79899 Other long term (current) drug therapy: Secondary | ICD-10-CM | POA: Insufficient documentation

## 2015-04-16 DIAGNOSIS — F17210 Nicotine dependence, cigarettes, uncomplicated: Secondary | ICD-10-CM | POA: Insufficient documentation

## 2015-04-16 DIAGNOSIS — F1010 Alcohol abuse, uncomplicated: Secondary | ICD-10-CM

## 2015-04-16 DIAGNOSIS — I252 Old myocardial infarction: Secondary | ICD-10-CM | POA: Insufficient documentation

## 2015-04-16 DIAGNOSIS — R45851 Suicidal ideations: Secondary | ICD-10-CM

## 2015-04-16 DIAGNOSIS — F32A Depression, unspecified: Secondary | ICD-10-CM

## 2015-04-16 HISTORY — DX: Acute myocardial infarction, unspecified: I21.9

## 2015-04-16 LAB — COMPREHENSIVE METABOLIC PANEL
ALBUMIN: 4.4 g/dL (ref 3.5–5.0)
ALK PHOS: 143 U/L — AB (ref 38–126)
ALT: 45 U/L (ref 17–63)
ANION GAP: 13 (ref 5–15)
AST: 41 U/L (ref 15–41)
BILIRUBIN TOTAL: 0.4 mg/dL (ref 0.3–1.2)
BUN: 6 mg/dL (ref 6–20)
CALCIUM: 8.6 mg/dL — AB (ref 8.9–10.3)
CO2: 21 mmol/L — ABNORMAL LOW (ref 22–32)
Chloride: 105 mmol/L (ref 101–111)
Creatinine, Ser: 0.93 mg/dL (ref 0.61–1.24)
GFR calc non Af Amer: >60 mL/min (ref >60)
GLUCOSE: 128 mg/dL — AB (ref 65–99)
POTASSIUM: 3.8 mmol/L (ref 3.5–5.1)
SODIUM: 139 mmol/L (ref 135–145)
TOTAL PROTEIN: 7.8 g/dL (ref 6.5–8.1)

## 2015-04-16 LAB — CBC
HEMATOCRIT: 47.4 % (ref 40.0–52.0)
HEMOGLOBIN: 15.9 g/dL (ref 13.0–18.0)
MCH: 33.5 pg (ref 26.0–34.0)
MCHC: 33.6 g/dL (ref 32.0–36.0)
MCV: 99.6 fL (ref 80.0–100.0)
Platelets: 356 10*3/uL (ref 150–440)
RBC: 4.76 MIL/uL (ref 4.40–5.90)
RDW: 18.5 % — ABNORMAL HIGH (ref 11.5–14.5)
WBC: 14.7 10*3/uL — AB (ref 3.8–10.6)

## 2015-04-16 LAB — URINE DRUG SCREEN, QUALITATIVE (ARMC ONLY)
AMPHETAMINES, UR SCREEN: NOT DETECTED
BARBITURATES, UR SCREEN: NOT DETECTED
Benzodiazepine, Ur Scrn: POSITIVE — AB
COCAINE METABOLITE, UR ~~LOC~~: NOT DETECTED
Cannabinoid 50 Ng, Ur ~~LOC~~: NOT DETECTED
MDMA (ECSTASY) UR SCREEN: NOT DETECTED
METHADONE SCREEN, URINE: NOT DETECTED
OPIATE, UR SCREEN: NOT DETECTED
Phencyclidine (PCP) Ur S: NOT DETECTED
TRICYCLIC, UR SCREEN: POSITIVE — AB

## 2015-04-16 LAB — ETHANOL: ALCOHOL ETHYL (B): 283 mg/dL — AB (ref <5)

## 2015-04-16 MED ORDER — METOPROLOL TARTRATE 25 MG PO TABS
25.0000 mg | ORAL_TABLET | Freq: Two times a day (BID) | ORAL | Status: DC
  Administered 2015-04-16 – 2015-04-17 (×2): 25 mg via ORAL
  Filled 2015-04-16 (×2): qty 1

## 2015-04-16 MED ORDER — LORAZEPAM 2 MG PO TABS
0.0000 mg | ORAL_TABLET | Freq: Four times a day (QID) | ORAL | Status: DC
  Administered 2015-04-17: 2 mg via ORAL
  Administered 2015-04-17: 4 mg via ORAL
  Filled 2015-04-16: qty 2
  Filled 2015-04-16: qty 1

## 2015-04-16 MED ORDER — LOSARTAN POTASSIUM 25 MG PO TABS
25.0000 mg | ORAL_TABLET | Freq: Every day | ORAL | Status: DC
  Administered 2015-04-17: 25 mg via ORAL
  Filled 2015-04-16 (×2): qty 1

## 2015-04-16 MED ORDER — ASPIRIN EC 81 MG PO TBEC
81.0000 mg | DELAYED_RELEASE_TABLET | Freq: Every day | ORAL | Status: DC
  Administered 2015-04-17: 81 mg via ORAL
  Filled 2015-04-16: qty 1

## 2015-04-16 MED ORDER — NITROGLYCERIN 0.4 MG SL SUBL
0.4000 mg | SUBLINGUAL_TABLET | SUBLINGUAL | Status: DC | PRN
  Filled 2015-04-16: qty 25

## 2015-04-16 MED ORDER — VITAMIN B-1 100 MG PO TABS
100.0000 mg | ORAL_TABLET | Freq: Every day | ORAL | Status: DC
  Administered 2015-04-16 – 2015-04-17 (×2): 100 mg via ORAL
  Filled 2015-04-16 (×2): qty 1

## 2015-04-16 MED ORDER — BUSPIRONE HCL 10 MG PO TABS
10.0000 mg | ORAL_TABLET | Freq: Two times a day (BID) | ORAL | Status: DC
  Administered 2015-04-16 – 2015-04-17 (×2): 10 mg via ORAL
  Filled 2015-04-16 (×2): qty 1

## 2015-04-16 MED ORDER — LORAZEPAM 2 MG PO TABS
0.0000 mg | ORAL_TABLET | Freq: Two times a day (BID) | ORAL | Status: DC
  Administered 2015-04-16: 1 mg via ORAL

## 2015-04-16 MED ORDER — LORAZEPAM 2 MG/ML IJ SOLN: 0.0000 mg | Freq: Two times a day (BID) | INTRAMUSCULAR | Status: DC

## 2015-04-16 MED ORDER — QUETIAPINE FUMARATE 200 MG PO TABS
200.0000 mg | ORAL_TABLET | Freq: Every day | ORAL | Status: DC
  Administered 2015-04-16: 200 mg via ORAL
  Filled 2015-04-16: qty 1

## 2015-04-16 MED ORDER — THIAMINE HCL 100 MG/ML IJ SOLN: 100.0000 mg | Freq: Every day | INTRAMUSCULAR | Status: DC

## 2015-04-16 MED ORDER — CLOPIDOGREL BISULFATE 75 MG PO TABS
75.0000 mg | ORAL_TABLET | Freq: Every day | ORAL | Status: DC
  Administered 2015-04-17: 75 mg via ORAL
  Filled 2015-04-16 (×2): qty 1

## 2015-04-16 MED ORDER — LORAZEPAM 2 MG/ML IJ SOLN: 0.0000 mg | Freq: Four times a day (QID) | INTRAMUSCULAR | Status: DC

## 2015-04-16 MED ORDER — SIMVASTATIN 40 MG PO TABS
40.0000 mg | ORAL_TABLET | Freq: Every day | ORAL | Status: DC
  Administered 2015-04-16: 40 mg via ORAL
  Filled 2015-04-16: qty 1

## 2015-04-16 MED ORDER — LORAZEPAM 1 MG PO TABS
1.0000 mg | ORAL_TABLET | Freq: Once | ORAL | Status: AC
  Administered 2015-04-16: 1 mg via ORAL
  Filled 2015-04-16: qty 1

## 2015-04-16 MED ORDER — LORAZEPAM 1 MG PO TABS
ORAL_TABLET | ORAL | Status: AC
  Filled 2015-04-16: qty 1

## 2015-04-16 NOTE — ED Notes (Signed)
ED BHU [place] Is the patient under IVC or is there intent for IVC: Yes.   Is the patient medically cleared: Yes.   Is there vacancy in the ED BHU: Yes.   Is the population mix appropriate for patient: Yes.   Is the patient awaiting placement in inpatient or outpatient setting: Yes.   Has the patient had a psychiatric consult: Yes.   Survey of unit performed for contraband, proper placement and condition of furniture, tampering with fixtures in bathroom, shower, and each patient room: Yes.  ; Findings: All clear. APPEARANCE/BEHAVIOR calm, cooperative and adequate rapport can be established NEURO ASSESSMENT Orientation: time, place and person Hallucinations: No.None noted (Hallucinations) Speech: Normal Gait: normal RESPIRATORY ASSESSMENT Within normal limits. CARDIOVASCULAR ASSESSMENT Within normal limits GASTROINTESTINAL ASSESSMENT Within normal limits EXTREMITIES ROM of all joints is normal PLAN OF CARE Provide calm/safe environment. Vital signs assessed twice daily. ED BHU Assessment once each 12-hour shift. Collaborate with intake RN daily or as condition indicates. Assure the ED provider has rounded once each shift. Provide and encourage hygiene. Provide redirection as needed. Assess for escalating behavior; address immediately and inform ED provider.  Assess family dynamic and appropriateness for visitation as needed: Yes.  ; If necessary, describe findings:  Educate the patient/family about BHU procedures/visitation: Yes.  ; If necessary, describe findings: Patient calm and cooperative at this time, understanding of BHU rules and procedures.  Will continue to monitor.

## 2015-04-16 NOTE — BH Assessment (Signed)
Assessment Note  Larry French is an 54 y.o. male presenting to the ED voluntarily for depression, anxiety, alcohol abuse, and benzodiazepine dependence. He presents today with increased anxiety, "falling off the wagon" drinking heavily today and yesterday, and reporting that he wants to kill himself by driving a car into a concrete bridge abutment. He denies suicide and states that he was just kidding but does admit that he said it and that his anxiety and depression of both been worse recently, particularly since his NSTEMI. Pt denies any auditory/visual hallucinations. Diagnosis: Suicidal  Past Medical History:  Past Medical History  Diagnosis Date  . Alcohol abuse     Recovered x 15 months  . CA - skin cancer   . Mental disorder   . Headache(784.0)   . Anxiety   . Depression   . CAD, residual CFX LAD disease 06/01/2011    a. s/p inferior ST elevation MI s/p PCI/DES to RCA on 05/29/2011; b. residual LAD and LCx CAD tx'd on 06/02/11 cath with LAD CAD successfully tx'd w/ PCI/DES, LCx managaged medically   . Hypertension   . Hyperlipemia   . PTSD (post-traumatic stress disorder)   . Polysubstance abuse     a. etoh, xanax, tobacco  . MI (myocardial infarction) [facility])     Past Surgical History  Procedure Laterality Date  . Tonsillectomy and adenoidectomy    . Left heart cath Right 05/29/2011    Procedure: LEFT HEART CATH;  Surgeon: Leonie Man, MD;  Location: [facility] CATH LAB;  Service: Cardiovascular;  Laterality: Right;  . Left heart catheterization with coronary angiogram [name] 06/01/2011    Procedure: LEFT HEART CATHETERIZATION WITH CORONARY ANGIOGRAM;  Surgeon: Leonie Man, MD;  Location: [facility] CATH LAB;  Service: Cardiovascular;  Laterality: [name];  relook cath, possible PCI  . Cardiac catheterization Bilateral 04/09/2015    Procedure: Coronary Angiogram;  Surgeon: Wellington Hampshire, MD;  Location: [place] CV LAB;  Service: Cardiovascular;  Laterality: Bilateral;  . Cardiac  catheterization [name] 04/09/2015    Procedure: Coronary Stent Intervention;  Surgeon: Wellington Hampshire, MD;  Location: [place] CV LAB;  Service: Cardiovascular;  Laterality: [name];    Family History:  Family History  Problem Relation Age of Onset  . Coronary artery disease Maternal Grandfather 76    Died    Social History:  reports that he has been smoking Cigarettes.  He has a 45 pack-year smoking history. He does not have any smokeless tobacco history on file. He reports that he drinks alcohol. He reports that he does not use illicit drugs.  Additional Social History:  Alcohol / Drug Use History of alcohol / drug use?: Yes Substance #1 Name of Substance 1: ETOH 1 - Age of First Use: 15 1 - Amount (size/oz): daily 1 - Frequency: bottle of wine 1 - Duration: five years 1 - Last Use / Amount: 04/16/2015  CIWA: CIWA-Ar BP: 138/89 mmHg Pulse Rate: 79 Nausea and Vomiting: no nausea and no vomiting Tactile Disturbances: none Tremor: moderate, with patient's arms extended Auditory Disturbances: not present Paroxysmal Sweats: no sweat visible Visual Disturbances: not present Anxiety: three Headache, Fullness in Head: severe Agitation: somewhat more than normal activity Orientation and Clouding of Sensorium: oriented and can do serial additions CIWA-Ar Total: 13 COWS:    Allergies: No Known Allergies  Home Medications:  (Not in a hospital admission)  OB/GYN Status:  No LMP for male patient.  General Assessment Data Location of Assessment: [facility] ED TTS Assessment:  In system Is this a Tele or Face-to-Face Assessment?: Face-to-Face Is this an Initial Assessment or a Re-assessment for this encounter?: Initial Assessment Marital status: Single Maiden name: [name] Is patient pregnant?: No Pregnancy Status: No Living Arrangements: Parent Can pt return to current living arrangement?: Yes Admission Status: Voluntary Is patient capable of signing voluntary admission?:  Yes Referral Source: Self/Family/Friend Insurance type: None  Medical Screening Exam ([place]) Medical Exam completed: Yes  Crisis Care Plan Living Arrangements: Parent Legal Guardian: Other: (self) Name of Psychiatrist: Dr. Randel Books Name of Therapist: Alvina Filbert  Education Status Is patient currently in school?: No Current Grade: [name] Highest grade of school patient has completed: Some Collge Name of school: n/a Contact person: n/a  Risk to self with the past 6 months Suicidal Ideation: Yes-Currently Present Has patient been a risk to self within the past 6 months prior to admission? : No Suicidal Intent: Yes-Currently Present Has patient had any suicidal intent within the past 6 months prior to admission? : No Is patient at risk for suicide?: Yes Suicidal Plan?: Yes-Currently Present Has patient had any suicidal plan within the past 6 months prior to admission? : No Specify Current Suicidal Plan: Pt reports plan to wreck his car Access to Means: Yes Specify Access to Suicidal Means: Pt has access to vehicle What has been your use of drugs/alcohol within the last 12 months?: alcohol Previous Attempts/Gestures: No How many times?: 0 Other Self Harm Risks: None reported Triggers for Past Attempts: Other (Comment) (Death of brother) Intentional Self Injurious Behavior: None Family Suicide History: No Recent stressful life event(s): Loss (Comment), Job Loss Persecutory voices/beliefs?: No Depression: Yes Depression Symptoms: Insomnia, Guilt, Loss of interest in usual pleasures, Feeling worthless/self pity Substance abuse history and/or treatment for substance abuse?: Yes Suicide prevention information given to non-admitted patients: Not applicable  Risk to Others within the past 6 months Homicidal Ideation: No Does patient have any lifetime risk of violence toward others beyond the six months prior to admission? : No Thoughts of Harm to Others: No Current  Homicidal Intent: No Current Homicidal Plan: No Access to Homicidal Means: No Identified Victim: None reported History of harm to others?: No Assessment of Violence: None Noted Violent Behavior Description: None reported Does patient have access to weapons?: No Criminal Charges Pending?: No Does patient have a court date: No Is patient on probation?: No  Psychosis Hallucinations: None noted Delusions: None noted  Mental Status Report Appearance/Hygiene: Unremarkable Eye Contact: Fair Motor Activity: Freedom of movement Speech: Logical/coherent Level of Consciousness: Alert Mood: Anxious Affect: Appropriate to circumstance Anxiety Level: Minimal Thought Processes: Relevant, Coherent Judgement: Unimpaired Orientation: Person, Place, Time, Situation, Appropriate for developmental age Obsessive Compulsive Thoughts/Behaviors: None  Cognitive Functioning Concentration: Normal Memory: Recent Intact, Remote Intact IQ: Average Insight: Fair Impulse Control: Fair Appetite: Fair Weight Loss: 0 Weight Gain: 0 Sleep: Decreased Total Hours of Sleep: 2 Vegetative Symptoms: None  ADLScreening [facility] Assessment Services) Patient's cognitive ability adequate to safely complete daily activities?: Yes Patient able to express need for assistance with ADLs?: Yes Independently performs ADLs?: Yes (appropriate for developmental age)  Prior Inpatient Therapy Prior Inpatient Therapy: Yes Prior Therapy Dates: 2014 Prior Therapy Facilty/Provider(s): [facility] Reason for Treatment: Detox  Prior Outpatient Therapy Prior Outpatient Therapy: Yes Prior Therapy Dates: Currently Prior Therapy Facilty/Provider(s): [facility] Reason for Treatment: Depression Does patient have an ACCT team?: No Does patient have Intensive In-House Services?  : No Does patient have [facility] services? : No Does patient have  P4CC services?: No  ADL Screening (condition at time of admission) Patient's cognitive ability  adequate to safely complete daily activities?: Yes Patient able to express need for assistance with ADLs?: Yes Independently performs ADLs?: Yes (appropriate for developmental age)       Abuse/Neglect Assessment (Assessment to be complete while patient is alone) Physical Abuse: Denies Verbal Abuse: Denies Sexual Abuse: Denies Exploitation of patient/patient's resources: Denies Self-Neglect: Denies Values / Beliefs Cultural Requests During Hospitalization: None Spiritual Requests During Hospitalization: None Consults Spiritual Care Consult Needed: No Social Work Consult Needed: No Regulatory affairs officer (For Healthcare) Does patient have an advance directive?: No    Additional Information 1:1 In Past 12 Months?: No CIRT Risk: No Elopement Risk: No Does patient have medical clearance?: Yes     Disposition:  Disposition Initial Assessment Completed for this Encounter: Yes Disposition of Patient: Other dispositions (Psych MD consult) Other disposition(s): Referred to outside facility (Psych MD consult)  On Site Evaluation by:   Reviewed with Physician:    Oneita Hurt 04/16/2015 10:39 PM

## 2015-04-16 NOTE — ED Notes (Signed)
Report to Jen, RN

## 2015-04-16 NOTE — ED Provider Notes (Signed)
[facility] Emergency Department Provider Note  ____________________________________________  Time seen: Approximately 9:15 PM  I have reviewed the triage vital signs and the nursing notes.   HISTORY  Chief Complaint Suicidal  History limited/questionable due to intoxication  HPI Larry French is a 54 y.o. male with a history of depression, anxiety, alcohol abuse, benzodiazepine dependence, tobacco abuse,and admission approximately one week ago for an NSTEMI status post stent.  He presents today with increased anxiety, "falling off the wagon" drinking heavily today and yesterday, and reporting that he wants to kill himself by driving a car into a concrete bridge abutment.  He denies the suicidality and states that he was just kidding but does admit that he said it and that his anxiety and depression of both been worse recently, particularly since his NSTEMI.  He is complaining of some persistent chest pain but it is not acute and nothing makes it better and nothing makes it worse.  Mostly his complaint is of anxiety and the fact that he is drinking again.  Those symptoms he describes as severe.   Past Medical History  Diagnosis Date  . Alcohol abuse     Recovered x 15 months  . CA - skin cancer   . Mental disorder   . Headache(784.0)   . Anxiety   . Depression   . CAD, residual CFX LAD disease 06/01/2011    a. s/p inferior ST elevation MI s/p PCI/DES to RCA on 05/29/2011; b. residual LAD and LCx CAD tx'd on 06/02/11 cath with LAD CAD successfully tx'd w/ PCI/DES, LCx managaged medically   . Hypertension   . Hyperlipemia   . PTSD (post-traumatic stress disorder)   . Polysubstance abuse     a. etoh, xanax, tobacco  . MI (myocardial infarction) [facility])     Patient Active Problem List   Diagnosis Date Noted  . Abnormal EKG   . Benzodiazepine withdrawal ([place])   . NSTEMI (non-ST elevated myocardial infarction) ([place])   . Positive cardiac stress test   .  Elevated troponin   . Withdrawal from benzodiazepine ([place]) 04/05/2015  . STEMI, RCA DES 05/28/10 06/01/2011  . 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 Tobacco abuse 05/29/2011  . History of ETOH abuse and Xanax abuse. In rehab pta 05/29/2011    Past Surgical History  Procedure Laterality Date  . Tonsillectomy and adenoidectomy    . Left heart cath Right 05/29/2011    Procedure: LEFT HEART CATH;  Surgeon: Leonie Man, MD;  Location: [facility] CATH LAB;  Service: Cardiovascular;  Laterality: Right;  . Left heart catheterization with coronary angiogram [name] 06/01/2011    Procedure: LEFT HEART CATHETERIZATION WITH CORONARY ANGIOGRAM;  Surgeon: Leonie Man, MD;  Location: [facility] CATH LAB;  Service: Cardiovascular;  Laterality: [name];  relook cath, possible PCI  . Cardiac catheterization Bilateral 04/09/2015    Procedure: Coronary Angiogram;  Surgeon: Wellington Hampshire, MD;  Location: [place] CV LAB;  Service: Cardiovascular;  Laterality: Bilateral;  . Cardiac catheterization [name] 04/09/2015    Procedure: Coronary Stent Intervention;  Surgeon: Wellington Hampshire, MD;  Location: [place] CV LAB;  Service: Cardiovascular;  Laterality: [name];    Current Outpatient Rx  Name  Route  Sig  Dispense  Refill  . aspirin EC 81 MG EC tablet   Oral   Take 1 tablet (81 mg total) by mouth daily.         . busPIRone (BUSPAR) 5 MG tablet   Oral  Take 2 tablets (10 mg total) by mouth 2 (two) times daily.   30 tablet   0   . clopidogrel (PLAVIX) 75 MG tablet   Oral   Take 1 tablet (75 mg total) by mouth daily.   60 tablet   1   . losartan (COZAAR) 25 MG tablet   Oral   Take 1 tablet (25 mg total) by mouth daily.   60 tablet   1   . metoprolol tartrate (LOPRESSOR) 25 MG tablet   Oral   Take 1 tablet (25 mg total) by mouth 2 (two) times daily.   60 tablet   1   . nitroGLYCERIN (NITROSTAT) 0.4 MG SL tablet   Sublingual   Place 0.4 mg under the tongue every 5 (five) minutes as needed for  chest pain.         Marland Kitchen QUEtiapine (SEROQUEL) 100 MG tablet   Oral   Take 200 mg by mouth at bedtime.          . simvastatin (ZOCOR) 40 MG tablet   Oral   Take 40 mg by mouth at bedtime.          . chlordiazePOXIDE (LIBRIUM) 25 MG capsule   Oral   Take 1 capsule (25 mg total) by mouth daily. Patient not taking: Reported on 04/16/2015   5 capsule   0     Allergies Review of patient's allergies indicates no known allergies.  Family History  Problem Relation Age of Onset  . Coronary artery disease Maternal Grandfather 12    Died    Social History Social History  Substance Use Topics  . Smoking status: Current Every Day Smoker -- 1.50 packs/day for 30 years    Types: Cigarettes  . Smokeless tobacco: None  . Alcohol Use: Yes     Comment: this week    Review of Systems Constitutional: No fever/chills Eyes: No visual changes. ENT: No sore throat. Cardiovascular: Persistent chest pain since recent cardiac catheterization Respiratory: Denies shortness of breath. Gastrointestinal: No abdominal pain.  No nausea, no vomiting.  No diarrhea.  No constipation. Genitourinary: Negative for dysuria. Musculoskeletal: Negative for back pain. Skin: Negative for rash. Neurological: Negative for headaches, focal weakness or numbness. Psychiatric:Reportedly having suicidality with plan of wrecking his car.  Patient does admit to increasing depression and anxiety recently.  10-point ROS otherwise negative.  ____________________________________________   PHYSICAL EXAM:  VITAL SIGNS: ED Triage Vitals  Enc Vitals Group     BP 04/16/15 1913 138/89 mmHg     Pulse Rate 04/16/15 1913 79     Resp 04/16/15 1913 20     Temp 04/16/15 1913 98.2 F (36.8 C)     Temp Source 04/16/15 1913 Oral     SpO2 04/16/15 1913 98 %     Weight 04/16/15 1913 200 lb (90.719 kg)     Height 04/16/15 1913 5\' 10"  (1.778 m)     Head Cir --      Peak Flow --      Pain Score --      Pain Loc --       Pain Edu? --      Excl. in [place]? --     Constitutional: Alert and oriented. Well appearing and in no acute distress but anxious.  Does not appear intoxicated in spite of his alcohol level of nearly 300 Eyes: Conjunctivae are normal. PERRL. EOMI. Head: Atraumatic. Nose: No congestion/rhinnorhea. Mouth/Throat: Mucous membranes are moist.  Oropharynx non-erythematous. Neck: No  stridor.   Cardiovascular: Normal rate, regular rhythm. Grossly normal heart sounds.  Good peripheral circulation. Respiratory: Normal respiratory effort.  No retractions. Lungs CTAB. Gastrointestinal: Soft and nontender. No distention. No abdominal bruits. No CVA tenderness. Musculoskeletal: No lower extremity tenderness nor edema.  No joint effusions. Neurologic:  Normal speech and language. No gross focal neurologic deficits are appreciated.  Skin:  Skin is warm, dry and intact. No rash noted. Psychiatric: Mood and affect are anxious and high strung.  He admits to drinking and increased anxiety and depression.  ____________________________________________   LABS (all labs ordered are listed, but only abnormal results are displayed)  Labs Reviewed  ETHANOL - Abnormal; Notable for the following:    Alcohol, Ethyl (B) 283 (*)    All other components within normal limits  COMPREHENSIVE METABOLIC PANEL - Abnormal; Notable for the following:    CO2 21 (*)    Glucose, Bld 128 (*)    Calcium 8.6 (*)    Alkaline Phosphatase 143 (*)    All other components within normal limits  CBC - Abnormal; Notable for the following:    WBC 14.7 (*)    RDW 18.5 (*)    All other components within normal limits  URINE DRUG SCREEN, QUALITATIVE ([facility] ONLY) - Abnormal; Notable for the following:    Tricyclic, Ur Screen POSITIVE (*)    Benzodiazepine, Ur Scrn POSITIVE (*)    All other components within normal limits  TROPONIN I   ____________________________________________  EKG  ED ECG REPORT I, Demeshia Sherburne, the attending  physician, personally viewed and interpreted this ECG.   Date: 04/16/2015  EKG Time: 22:14  Rate: 75  Rhythm: normal sinus rhythm  Axis: Normal  Intervals:right bundle branch block  ST&T Change: Non-specific ST segment / T-wave changes, but no evidence of acute ischemia.   ____________________________________________  RADIOLOGY   Dg Chest 2 View  04/16/2015  CLINICAL DATA:  Myocardial infarction 2 weeks ago. 3 hours at tachycardia, chest pain and shortness of breath. History of alcohol abuse, hypertension, hyperlipidemia, coronary artery disease. EXAM: CHEST  2 VIEW COMPARISON:  Chest radiograph April 05, 2015 FINDINGS: The heart size and mediastinal contours are within normal limits. Mild bronchitic changes. Both lungs are clear. Mild wedging of vertebral body L1, age indeterminate. IMPRESSION: Mild bronchitic changes without focal consolidation. Electronically Signed   By: Elon Alas M.D.   On: 04/16/2015 21:27    ____________________________________________   PROCEDURES  Procedure(s) performed: None  Critical Care performed: No ____________________________________________   INITIAL IMPRESSION / ASSESSMENT AND PLAN / ED COURSE  Pertinent labs & imaging results that were available during my care of the patient were reviewed by me and considered in my medical decision making (see chart for details).  The patient is denying currently lying to kill himself but reportedly made several comments about this, plus he is a substance abuser who is drinking again with increased depression and recent health issues.  I believe this places him at high risk and merits psychiatric evaluation.  I placed him under involuntary commitment.  He has no acute medical issues at this time.  CIWA protocol has been initiated.  ____________________________________________  FINAL CLINICAL IMPRESSION(S) / ED DIAGNOSES  Final diagnoses:  Depression  Anxiety  Substance induced mood disorder  (HCC)  Chronic chest pain  Tobacco abuse  Coronary artery disease with other forms of angina pectoris      NEW MEDICATIONS STARTED DURING THIS VISIT:  New Prescriptions   No medications on file  Hinda Kehr, MD 04/16/15 2216

## 2015-04-16 NOTE — ED Notes (Signed)
Patient to ED-[facility] from ED ambulatory without difficulty to room #7.  Patient is alert and oriented, calm and cooperative and in no apparent distress at the present time.  Patient denies any pain currently.  Patient is oriented to the unit.  Patient denies suicidal ideation, homicidal ideation, auditory or visual hallucinations currently.  Patient is made aware of security cameras and q.15 minute safety checks.  Patient is encouraged to notify staff of any questions or concerns.

## 2015-04-16 NOTE — ED Notes (Signed)
Pt arrives to ER voluntarily with father. Pt states that he has mentioned X 2 that he would like to drive his car into a ramp in effort to kill himself. Pt denies previous attempts of SI. Pt states that he is homeless, living with friend. Pt hx of alcohol abuse. Pt last drink at 5AM today, pt drinks wine daily. approx 3-4 mini bottles. Pt states that he is anxious, unable to sleep. Pt takes Xanax for anxiety but is out of them. Pt cooperative.

## 2015-04-16 NOTE — ED Notes (Signed)
Report received from Ally RN. 

## 2015-04-17 DIAGNOSIS — F1010 Alcohol abuse, uncomplicated: Secondary | ICD-10-CM

## 2015-04-17 DIAGNOSIS — R45851 Suicidal ideations: Secondary | ICD-10-CM

## 2015-04-17 DIAGNOSIS — F4310 Post-traumatic stress disorder, unspecified: Secondary | ICD-10-CM

## 2015-04-17 DIAGNOSIS — F1310 Sedative, hypnotic or anxiolytic abuse, uncomplicated: Secondary | ICD-10-CM

## 2015-04-17 LAB — TROPONIN I: Troponin I: <0.03 ng/mL (ref <0.031)

## 2015-04-17 MED ORDER — QUETIAPINE FUMARATE 200 MG PO TABS
200.0000 mg | ORAL_TABLET | Freq: Every day | ORAL | Status: DC
Start: 2015-04-17 — End: 2015-04-29

## 2015-04-17 MED ORDER — QUETIAPINE FUMARATE 100 MG PO TABS: 200.0000 mg | ORAL_TABLET | Freq: Every day | ORAL | Status: DC

## 2015-04-17 NOTE — ED Notes (Signed)
Pt resting comfortably in room watching tv.  No distress noted. However, when pt sees staff voices complains of increased anxiety. MD made aware. Maintained on 15 minute checks and observation by security camera for safety.

## 2015-04-17 NOTE — ED Notes (Signed)
Pt ambulated to the bathroom to void and when he was done he approached the nurse desk to ask for more medication to "calm him down." Pt was told that he had 4 of Ativan 2 hrs ago and that he had to wait until he can have more. Pt stated: "I know that is why I can wait to see the psychiatrist, I need some strong stuff to get calm. I guess that I can go back to bed and relax." Pt appeared to be AOx4 in no apparent distress.

## 2015-04-17 NOTE — ED Notes (Signed)
Belongings sent with patient. Patient denies SI/HI and AVH.

## 2015-04-17 NOTE — ED Notes (Signed)
Pt complaining of anxiety and seeking ativan to "let him relax."  However, minutes later pt was sleeping, no distress noted. Pt was cooperative with staff. Maintained on 15 minute checks and observation by security camera for safety.

## 2015-04-17 NOTE — ED Notes (Signed)
Pt continues to ask for ativan. RN explained the MD was made aware and had not placed any new orders. Pt accepting. Pt asking when he will be discharged. RN explained he cannot be discharged until all paperwork is complete. Maintained on 15 minute checks and observation by security camera for safety.

## 2015-04-17 NOTE — ED Notes (Signed)
Pt still requesting medication "to calm him down." Pt was told the MD did not place any new orders. Pt accepting. Maintained on 15 minute checks and observation by security camera for safety.

## 2015-04-17 NOTE — ED Notes (Signed)
Psychiatrist at bedside. Maintained on 15 minute checks and observation by security camera for safety.

## 2015-04-17 NOTE — ED Notes (Signed)
Patient resting quietly in room. No noted distress or abnormal behaviors noted. Will continue 15 minute checks and observation by security camera for safety. 

## 2015-04-17 NOTE — Progress Notes (Signed)
Pt has been referred to [facility] for follow-up and aftercare services.   04/17/2015 Con Memos, MS, [place], [facility] Therapeutic Triage Specialist

## 2015-04-17 NOTE — Consult Note (Signed)
[facility] Face-to-Face Psychiatry Consult   Reason for Consult:  Consult for this 54 year old man with a history of chronic anxiety and alcohol and benzodiazepine abuse presents with transient suicidal thoughts. Referring Physician:  Joni Fears Patient Identification: Larry French MRN:  [id number] Principal Diagnosis: Alcohol abuse Diagnosis:   Patient Active Problem List   Diagnosis Date Noted  . Alcohol abuse [F10.10] 04/17/2015  . Benzodiazepine abuse [F13.10] 04/17/2015  . Posttraumatic stress disorder [F43.10] 04/17/2015  . Suicidal ideation [R45.851] 04/17/2015  . Abnormal EKG [R94.31]   . Benzodiazepine withdrawal ([place]) [F13.239]   . NSTEMI (non-ST elevated myocardial infarction) ([place]) [I21.4]   . Positive cardiac stress test [R94.39]   . Elevated troponin [R79.89]   . Withdrawal from benzodiazepine ([place]) [F13.239] 04/05/2015  . STEMI, RCA DES 05/28/10 [I21.3] 06/01/2011  . CAD, residual CFX LAD disease [I25.10] 06/01/2011  . Tobacco abuse [Z72.0] 05/29/2011  . History of ETOH abuse and Xanax abuse. In rehab pta [F10.10] 05/29/2011    Total Time spent with patient: 1 hour  Subjective:   Larry French is a 54 y.o. male patient [facility]".  HPI:  Patient interviewed. Chart reviewed. Old notes reviewed. Labs reviewed. 55 year old man came voluntarily to the emergency room. It was reported initially that he had made suicidal statements. Patient denied repeatedly to me having any suicidal thoughts at all. No wish to die or harm himself. He says that he came here because [facility] and was told that they smelled alcohol on his breath and so he needed to come here to the hospital. Patient claimed at first to me that he had not had alcohol in 2 days. Later admitted that he had been drinking basically all day yesterday. Presented to the ER with a blood alcohol level well over 200. Patient has not recently been  taking any of his psychiatric medicine. He stopped it several weeks ago. Hasn't really had a stable place to stay. Mood stays anxious and somewhat down. Sleep intermittent. Denies having any auditory or visual hallucinations. Denies any suicidal thoughts or homicidal thoughts.  Social history: Patient's parents have kicked him out of the house as of this month because of his repeated drinking problems. His brother died this last 2022-07-04 and the patient says he moved in with his parents with the thought that he was taking care of them. It sounds like probably it was the other way around. Patient doesn't have much in the way of active social support right now.  Medical history: Says he's had a heart attack in the past although I am not sure that is documented. He says he's had 1 seizure in the past. Denies other ongoing medical problems.  Substance abuse history: History of alcohol abuse and benzodiazepine abuse. Has been able to have long periods of sobriety in the past. Completed the [facility] program in [place] but that was over a decade ago.  Past Psychiatric History: Patient says he has been diagnosed with posttraumatic stress disorder. Multiple medicines have been tried including most recently Seroquel and Cymbalta. He says he stopped taking them because he was worried about how they would interact with the alcohol. He denies any past suicide attempts. He does have a history of psychiatric hospitalization mostly around alcohol abuse.  Risk to Self: Suicidal Ideation: Yes-Currently Present Suicidal Intent: Yes-Currently Present Is patient at risk for suicide?: Yes Suicidal Plan?: Yes-Currently Present Specify Current Suicidal Plan: Pt reports plan  to wreck his car Access to Means: Yes Specify Access to Suicidal Means: Pt has access to vehicle What has been your use of drugs/alcohol within the last 12 months?: alcohol How many times?: 0 Other Self Harm Risks: None reported Triggers for Past Attempts:  Other (Comment) (Death of brother) Intentional Self Injurious Behavior: None Risk to Others: Homicidal Ideation: No Thoughts of Harm to Others: No Current Homicidal Intent: No Current Homicidal Plan: No Access to Homicidal Means: No Identified Victim: None reported History of harm to others?: No Assessment of Violence: None Noted Violent Behavior Description: None reported Does patient have access to weapons?: No Criminal Charges Pending?: No Does patient have a court date: No Prior Inpatient Therapy: Prior Inpatient Therapy: Yes Prior Therapy Dates: 2014 Prior Therapy Facilty/Provider(s): [facility] Reason for Treatment: Detox Prior Outpatient Therapy: Prior Outpatient Therapy: Yes Prior Therapy Dates: Currently Prior Therapy Facilty/Provider(s): [facility] Reason for Treatment: Depression Does patient have an ACCT team?: No Does patient have Intensive In-House Services?  : No Does patient have [facility] services? : No Does patient have P4CC services?: No  Past Medical History:  Past Medical History  Diagnosis Date  . Alcohol abuse     Recovered x 15 months  . CA - skin cancer   . Mental disorder   . Headache(784.0)   . Anxiety   . Depression   . CAD, residual CFX LAD disease 06/01/2011    a. s/p inferior ST elevation MI s/p PCI/DES to RCA on 05/29/2011; b. residual LAD and LCx CAD tx'd on 06/02/11 cath with LAD CAD successfully tx'd w/ PCI/DES, LCx managaged medically   . Hypertension   . Hyperlipemia   . PTSD (post-traumatic stress disorder)   . Polysubstance abuse     a. etoh, xanax, tobacco  . MI (myocardial infarction) [facility])     Past Surgical History  Procedure Laterality Date  . Tonsillectomy and adenoidectomy    . Left heart cath Right 05/29/2011    Procedure: LEFT HEART CATH;  Surgeon: Leonie Man, MD;  Location: [facility] CATH LAB;  Service: Cardiovascular;  Laterality: Right;  . Left heart catheterization with coronary angiogram [name] 06/01/2011    Procedure: LEFT HEART  CATHETERIZATION WITH CORONARY ANGIOGRAM;  Surgeon: Leonie Man, MD;  Location: [facility] CATH LAB;  Service: Cardiovascular;  Laterality: [name];  relook cath, possible PCI  . Cardiac catheterization Bilateral 04/09/2015    Procedure: Coronary Angiogram;  Surgeon: Wellington Hampshire, MD;  Location: [facility] CV LAB;  Service: Cardiovascular;  Laterality: Bilateral;  . Cardiac catheterization [name] 04/09/2015    Procedure: Coronary Stent Intervention;  Surgeon: Wellington Hampshire, MD;  Location: [facility] CV LAB;  Service: Cardiovascular;  Laterality: [name];   Family History:  Family History  Problem Relation Age of Onset  . Coronary artery disease Maternal Grandfather 11    Died   Family Psychiatric  History: Patient says both his mother and father suffered from depression Social History:  History  Alcohol Use  . Yes    Comment: this week     History  Drug Use No    Social History   Social History  . Marital Status: Single    Spouse Name: [name]  . Number of Children: 0  . Years of Education: [name]   Occupational History  . Student    Social History Main Topics  . Smoking status: Current Every Day Smoker -- 1.50 packs/day for 30 years    Types: Cigarettes  . Smokeless tobacco: None  .  Alcohol Use: Yes     Comment: this week  . Drug Use: No  . Sexual Activity: No   Other Topics Concern  . None   Social History Narrative   Additional Social History:    History of alcohol / drug use?: Yes Name of Substance 1: ETOH 1 - Age of First Use: 15 1 - Amount (size/oz): daily 1 - Frequency: bottle of wine 1 - Duration: five years 1 - Last Use / Amount: 04/16/2015                   Allergies:  No Known Allergies  Labs:  Results for orders placed or performed during the hospital encounter of 04/16/15 (from the past 48 hour(s))  Ethanol (ETOH)     Status: Abnormal   Collection Time: 04/16/15  6:59 PM  Result Value Ref Range   Alcohol, Ethyl (B) 283 (H) <5 mg/dL     Comment:        LOWEST DETECTABLE LIMIT FOR SERUM ALCOHOL IS 5 mg/dL FOR MEDICAL PURPOSES ONLY   Comprehensive metabolic panel     Status: Abnormal   Collection Time: 04/16/15  6:59 PM  Result Value Ref Range   Sodium 139 135 - 145 mmol/L   Potassium 3.8 3.5 - 5.1 mmol/L   Chloride 105 101 - 111 mmol/L   CO2 21 (L) 22 - 32 mmol/L   Glucose, Bld 128 (H) 65 - 99 mg/dL   BUN 6 6 - 20 mg/dL   Creatinine, Ser 0.93 0.61 - 1.24 mg/dL   Calcium 8.6 (L) 8.9 - 10.3 mg/dL   Total Protein 7.8 6.5 - 8.1 g/dL   Albumin 4.4 3.5 - 5.0 g/dL   AST 41 15 - 41 U/L   ALT 45 17 - 63 U/L   Alkaline Phosphatase 143 (H) 38 - 126 U/L   Total Bilirubin 0.4 0.3 - 1.2 mg/dL   GFR calc non Af Amer >60 >60 mL/min   GFR calc Af Amer >60 >60 mL/min    Comment: (NOTE) The eGFR has been calculated using the CKD EPI equation. This calculation has not been validated in all clinical situations. eGFR's persistently <60 mL/min signify possible Chronic Kidney Disease.    Anion gap 13 5 - 15  CBC     Status: Abnormal   Collection Time: 04/16/15  6:59 PM  Result Value Ref Range   WBC 14.7 (H) 3.8 - 10.6 K/uL   RBC 4.76 4.40 - 5.90 MIL/uL   Hemoglobin 15.9 13.0 - 18.0 g/dL   HCT 47.4 40.0 - 52.0 %   MCV 99.6 80.0 - 100.0 fL   MCH 33.5 26.0 - 34.0 pg   MCHC 33.6 32.0 - 36.0 g/dL   RDW 18.5 (H) 11.5 - 14.5 %   Platelets 356 150 - 440 K/uL  Urine Drug Screen, Qualitative ([facility] only)     Status: Abnormal   Collection Time: 04/16/15  6:59 PM  Result Value Ref Range   Tricyclic, Ur Screen POSITIVE (A) NONE DETECTED   Amphetamines, Ur Screen NONE DETECTED NONE DETECTED   MDMA (Ecstasy)Ur Screen NONE DETECTED NONE DETECTED   Cocaine Metabolite,Ur [place] NONE DETECTED NONE DETECTED   Opiate, Ur Screen NONE DETECTED NONE DETECTED   Phencyclidine (PCP) Ur S NONE DETECTED NONE DETECTED   Cannabinoid 50 Ng, Ur [place] NONE DETECTED NONE DETECTED   Barbiturates, Ur Screen NONE DETECTED NONE DETECTED   Benzodiazepine, Ur Scrn  POSITIVE (A) NONE DETECTED   Methadone Scn, Ur  NONE DETECTED NONE DETECTED    Comment: (NOTE) 093  Tricyclics, urine               Cutoff 1000 ng/mL 200  Amphetamines, urine             Cutoff 1000 ng/mL 300  MDMA (Ecstasy), urine           Cutoff 500 ng/mL 400  Cocaine Metabolite, urine       Cutoff 300 ng/mL 500  Opiate, urine                   Cutoff 300 ng/mL 600  Phencyclidine (PCP), urine      Cutoff 25 ng/mL 700  Cannabinoid, urine              Cutoff 50 ng/mL 800  Barbiturates, urine             Cutoff 200 ng/mL 900  Benzodiazepine, urine           Cutoff 200 ng/mL 1000 Methadone, urine                Cutoff 300 ng/mL 1100 1200 The urine drug screen provides only a preliminary, unconfirmed 1300 analytical test result and should not be used for non-medical 1400 purposes. Clinical consideration and professional judgment should 1500 be applied to any positive drug screen result due to possible 1600 interfering substances. A more specific alternate chemical method 1700 must be used in order to obtain a confirmed analytical result.  1800 Gas chromato graphy / mass spectrometry (GC/MS) is the preferred 1900 confirmatory method.   Troponin I     Status: None   Collection Time: 04/16/15  6:59 PM  Result Value Ref Range   Troponin I <0.03 <0.031 ng/mL    Comment:        NO INDICATION OF MYOCARDIAL INJURY.     Current Facility-Administered Medications  Medication Dose Route Frequency Provider Last Rate Last Dose  . aspirin EC tablet 81 mg  81 mg Oral Daily Hinda Kehr, MD   81 mg at 04/17/15 8182  . busPIRone (BUSPAR) tablet 10 mg  10 mg Oral BID Hinda Kehr, MD   10 mg at 04/17/15 0925  . clopidogrel (PLAVIX) tablet 75 mg  75 mg Oral Daily Hinda Kehr, MD   75 mg at 04/17/15 0924  . LORazepam (ATIVAN) injection 0-4 mg  0-4 mg Intravenous 4 times per day Hinda Kehr, MD      . LORazepam (ATIVAN) injection 0-4 mg  0-4 mg Intravenous Q12H Hinda Kehr, MD   0 mg at 04/16/15  2305  . LORazepam (ATIVAN) tablet 0-4 mg  0-4 mg Oral 4 times per day Hinda Kehr, MD   4 mg at 04/17/15 0432  . LORazepam (ATIVAN) tablet 0-4 mg  0-4 mg Oral Q12H Hinda Kehr, MD   1 mg at 04/16/15 2145  . losartan (COZAAR) tablet 25 mg  25 mg Oral Daily Hinda Kehr, MD   25 mg at 04/17/15 9937  . metoprolol tartrate (LOPRESSOR) tablet 25 mg  25 mg Oral BID Hinda Kehr, MD   25 mg at 04/17/15 1696  . nitroGLYCERIN (NITROSTAT) SL tablet 0.4 mg  0.4 mg Sublingual Q5 min PRN Hinda Kehr, MD      . QUEtiapine (SEROQUEL) tablet 200 mg  200 mg Oral QHS Hinda Kehr, MD   200 mg at 04/16/15 2255  . simvastatin (ZOCOR) tablet 40 mg  40 mg Oral QHS Hinda Kehr, MD  40 mg at 04/16/15 2256  . thiamine (B-1) injection 100 mg  100 mg Intravenous Daily Hinda Kehr, MD   100 mg at 04/16/15 2303  . thiamine (VITAMIN B-1) tablet 100 mg  100 mg Oral Daily Hinda Kehr, MD   100 mg at 04/17/15 5945   Current Outpatient Prescriptions  Medication Sig Dispense Refill  . aspirin EC 81 MG EC tablet Take 1 tablet (81 mg total) by mouth daily.    . busPIRone (BUSPAR) 5 MG tablet Take 2 tablets (10 mg total) by mouth 2 (two) times daily. 30 tablet 0  . clopidogrel (PLAVIX) 75 MG tablet Take 1 tablet (75 mg total) by mouth daily. 60 tablet 1  . losartan (COZAAR) 25 MG tablet Take 1 tablet (25 mg total) by mouth daily. 60 tablet 1  . metoprolol tartrate (LOPRESSOR) 25 MG tablet Take 1 tablet (25 mg total) by mouth 2 (two) times daily. 60 tablet 1  . nitroGLYCERIN (NITROSTAT) 0.4 MG SL tablet Place 0.4 mg under the tongue every 5 (five) minutes as needed for chest pain.    Marland Kitchen QUEtiapine (SEROQUEL) 100 MG tablet Take 200 mg by mouth at bedtime.     . simvastatin (ZOCOR) 40 MG tablet Take 40 mg by mouth at bedtime.     . chlordiazePOXIDE (LIBRIUM) 25 MG capsule Take 1 capsule (25 mg total) by mouth daily. (Patient not taking: Reported on 04/16/2015) 5 capsule 0    Musculoskeletal: Strength & Muscle Tone: within  normal limits Gait & Station: normal Patient leans: [name]  Psychiatric Specialty Exam: Review of Systems  Constitutional: Negative.   HENT: Negative.   Eyes: Negative.   Respiratory: Negative.   Cardiovascular: Negative.   Gastrointestinal: Negative.   Musculoskeletal: Negative.   Skin: Negative.   Neurological: Positive for tremors.  Psychiatric/Behavioral: Positive for substance abuse. Negative for depression, suicidal ideas, hallucinations and memory loss. The patient is nervous/anxious and has insomnia.     Blood pressure 138/104, pulse 120, temperature 97.7 F (36.5 C), temperature source Oral, resp. rate 20, height '5\' 10"'$  (1.778 m), weight 90.719 kg (200 lb), SpO2 99 %.Body mass index is 28.7 kg/(m^2).  General Appearance: Disheveled  Eye Contact::  Good  Speech:  Clear and Coherent  Volume:  Normal  Mood:  Anxious  Affect:  Congruent  Thought Process:  Linear  Orientation:  Full (Time, Place, and Person)  Thought Content:  Negative  Suicidal Thoughts:  No  Homicidal Thoughts:  No  Memory:  Immediate;   Fair Recent;   Fair Remote;   Fair  Judgement:  Intact  Insight:  Fair  Psychomotor Activity:  Decreased  Concentration:  Fair  Recall:  [employer] of Knowledge:Fair  Language: Fair  Akathisia:  No  Handed:  Right  AIMS (if indicated):     Assets:  Communication Skills Desire for Improvement Financial Resources/Insurance Resilience  ADL's:  Intact  Cognition: WNL  Sleep:      Treatment Plan Summary: Plan 54 year old man with a history of alcohol abuse and benzodiazepine abuse. Drug screen positive for benzodiazepines alcohol level over 200 yesterday. Patient is not showing any signs of delirium. He is mildly tremulous but is able to walk and eat. Patient denies any suicidal ideation. Problems of suicidal ideation appears to be resolved. Alcohol withdrawal appears to be proceeding without need for inpatient management. Patient has been strongly counseled to get  back actively involved with outpatient treatment through [facility] as he has in the past and to [facility]. I have offered to restart his Seroquel 200 mg at night that he was taking previously and we'll give him a prescription for that but he needs to follow up with [facility]. Case reviewed with emergency room doctor. IVC discontinued patient can be discharged from the emergency room.  Disposition: Patient does not meet criteria for psychiatric inpatient admission. Supportive therapy provided about ongoing stressors. Discussed crisis plan, support from social network, calling 911, coming to the Emergency Department, and calling Suicide Hotline.  John Clapacs 04/17/2015 2:06 PM

## 2015-04-17 NOTE — ED Notes (Signed)
Patient ready for discharge. 

## 2015-04-17 NOTE — ED Notes (Signed)
Pt approached the nurse desk after going to the bathroom to see if he could get something for his anxiety/tremors. Crue Otero RN and English as a second language teacher did a CIWA scale on the Pt and the result was 24. Dr Dahlia Client was notified of the findings and the Pt received  4mg  of Ativan per PRN protocol.

## 2015-04-17 NOTE — ED Notes (Signed)
Patient asleep in room. No noted distress or abnormal behavior. Will continue 15 minute checks and observation by security cameras for safety. 

## 2015-04-17 NOTE — ED Provider Notes (Signed)
-----------------------------------------   3:21 PM on 04/17/2015 -----------------------------------------  Patient remains medically stable. Case discussed with Dr. Weber Cooks of psychiatry after his evaluation in the emergency department. Finds that the patient has presented with alcohol intoxication because [facility] and so needed a place to stay until he sobered up. He is now clinically sober, eating and drinking ambulatory with steady gait, clear speech. No acute psychiatric complaints or medical complaints at this time. We'll discharge have him [facility] with [facility].  Carrie Mew, MD 04/17/15 [id number]

## 2015-04-17 NOTE — ED Notes (Signed)
Pt asked to make a phone call. Pt stated "I need to be discharged so I can find a place to stay tonight." RN reassured pt the MD was working on the paperwork. No noted distress or abnormal behaviors noted. Will continue 15 minute checks and observation by security camera for safety.

## 2015-04-17 NOTE — ED Provider Notes (Signed)
-----------------------------------------   8:31 AM on 04/17/2015 -----------------------------------------   Blood pressure 134/93, pulse 110, temperature 98.2 F (36.8 C), temperature source Oral, resp. rate 20, height 5\' 10"  (1.778 m), weight 200 lb (90.719 kg), SpO2 98 %.  The patient had no acute events since last update.  Calm and cooperative at this time.  Patient is awaiting to be seen by psych       Loney Hering, MD 04/17/15 [id number]

## 2015-04-17 NOTE — ED Notes (Signed)
Pt continuing to come to the nurses station asking for MD to re-evaluate him and medicate his anxiety. Psychiatrist made aware. Pt is to be discharged today. Waiting on paperwork to be completed. Maintained on 15 minute checks and observation by security camera for safety.

## 2015-04-19 ENCOUNTER — Emergency Department
Admission: EM | Admit: 2015-04-19 | Discharge: 2015-04-19 | Disposition: A | Discharge status: Home / Self Care | Payer: Self-pay | Attending: Emergency Medicine | Admitting: Emergency Medicine

## 2015-04-19 ENCOUNTER — Emergency Department: Payer: Self-pay

## 2015-04-19 ENCOUNTER — Encounter: Payer: Self-pay | Admitting: Emergency Medicine

## 2015-04-19 DIAGNOSIS — Z765 Malingerer [conscious simulation]: Secondary | ICD-10-CM | POA: Insufficient documentation

## 2015-04-19 DIAGNOSIS — Z7982 Long term (current) use of aspirin: Secondary | ICD-10-CM | POA: Insufficient documentation

## 2015-04-19 DIAGNOSIS — R002 Palpitations: Secondary | ICD-10-CM | POA: Insufficient documentation

## 2015-04-19 DIAGNOSIS — I10 Essential (primary) hypertension: Secondary | ICD-10-CM | POA: Insufficient documentation

## 2015-04-19 DIAGNOSIS — F419 Anxiety disorder, unspecified: Secondary | ICD-10-CM | POA: Insufficient documentation

## 2015-04-19 DIAGNOSIS — Z7902 Long term (current) use of antithrombotics/antiplatelets: Secondary | ICD-10-CM | POA: Insufficient documentation

## 2015-04-19 DIAGNOSIS — F1310 Sedative, hypnotic or anxiolytic abuse, uncomplicated: Secondary | ICD-10-CM | POA: Insufficient documentation

## 2015-04-19 DIAGNOSIS — F17210 Nicotine dependence, cigarettes, uncomplicated: Secondary | ICD-10-CM | POA: Insufficient documentation

## 2015-04-19 DIAGNOSIS — Z79899 Other long term (current) drug therapy: Secondary | ICD-10-CM | POA: Insufficient documentation

## 2015-04-19 LAB — COMPREHENSIVE METABOLIC PANEL
ALBUMIN: 4.2 g/dL (ref 3.5–5.0)
ALK PHOS: 158 U/L — AB (ref 38–126)
ALT: 50 U/L (ref 17–63)
ANION GAP: 11 (ref 5–15)
AST: 49 U/L — ABNORMAL HIGH (ref 15–41)
BILIRUBIN TOTAL: 0.8 mg/dL (ref 0.3–1.2)
BUN: 13 mg/dL (ref 6–20)
CALCIUM: 9.4 mg/dL (ref 8.9–10.3)
CO2: 22 mmol/L (ref 22–32)
Chloride: 106 mmol/L (ref 101–111)
Creatinine, Ser: 1.05 mg/dL (ref 0.61–1.24)
GFR calc Af Amer: >60 mL/min (ref >60)
GLUCOSE: 112 mg/dL — AB (ref 65–99)
POTASSIUM: 3.4 mmol/L — AB (ref 3.5–5.1)
Sodium: 139 mmol/L (ref 135–145)
TOTAL PROTEIN: 7.3 g/dL (ref 6.5–8.1)

## 2015-04-19 LAB — FIBRIN DERIVATIVES D-DIMER (ARMC ONLY): Fibrin derivatives D-dimer (ARMC): 828 — ABNORMAL HIGH (ref 0–499)

## 2015-04-19 LAB — CBC WITH DIFFERENTIAL/PLATELET
BASOS PCT: 0 %
Basophils Absolute: 0 10*3/uL (ref 0–0.1)
Eosinophils Absolute: 0.1 10*3/uL (ref 0–0.7)
Eosinophils Relative: 1 %
HEMATOCRIT: 44.3 % (ref 40.0–52.0)
HEMOGLOBIN: 14.8 g/dL (ref 13.0–18.0)
LYMPHS ABS: 1.5 10*3/uL (ref 1.0–3.6)
LYMPHS PCT: 17 %
MCH: 32.7 pg (ref 26.0–34.0)
MCHC: 33.5 g/dL (ref 32.0–36.0)
MCV: 97.7 fL (ref 80.0–100.0)
MONO ABS: 0.6 10*3/uL (ref 0.2–1.0)
MONOS PCT: 6 %
NEUTROS ABS: 6.9 10*3/uL — AB (ref 1.4–6.5)
NEUTROS PCT: 76 %
Platelets: 254 10*3/uL (ref 150–440)
RBC: 4.54 MIL/uL (ref 4.40–5.90)
RDW: 17.8 % — AB (ref 11.5–14.5)
WBC: 9.1 10*3/uL (ref 3.8–10.6)

## 2015-04-19 LAB — TROPONIN I

## 2015-04-19 MED ORDER — LORAZEPAM 2 MG/ML IJ SOLN
INTRAMUSCULAR | Status: AC
  Administered 2015-04-19: 1 mg via INTRAVENOUS
  Filled 2015-04-19: qty 1

## 2015-04-19 MED ORDER — SODIUM CHLORIDE 0.9 % IV BOLUS (SEPSIS)
1000.0000 mL | Freq: Once | INTRAVENOUS | Status: AC
  Administered 2015-04-19: 1000 mL via INTRAVENOUS

## 2015-04-19 MED ORDER — IOHEXOL 350 MG/ML SOLN
75.0000 mL | Freq: Once | INTRAVENOUS | Status: AC | PRN
  Administered 2015-04-19: 75 mL via INTRAVENOUS

## 2015-04-19 MED ORDER — LORAZEPAM 2 MG/ML IJ SOLN
1.0000 mg | Freq: Once | INTRAMUSCULAR | Status: AC
  Administered 2015-04-19: 1 mg via INTRAVENOUS

## 2015-04-19 MED ORDER — LORAZEPAM 2 MG/ML IJ SOLN
1.0000 mg | Freq: Once | INTRAMUSCULAR | Status: AC
  Administered 2015-04-19: 1 mg via INTRAVENOUS
  Filled 2015-04-19: qty 1

## 2015-04-19 NOTE — ED Notes (Signed)
Patient transported to CT 

## 2015-04-19 NOTE — ED Notes (Signed)
Spoke with lab and they state they do have blood in lab to complete D-Dimer

## 2015-04-19 NOTE — ED Notes (Signed)
MD at bedside. 

## 2015-04-19 NOTE — ED Notes (Signed)
Pt complains of anxiety that he can't get under control. Pt states it started when his brother was killed in an [facility] of March this year. Pt states he has been taking care of his parents and around Thanksgiving feels like his anxiety was spiraling out of control. Pt states that he has had sharp stabbing chest pain radiating to right side of chest with SOB. Pt states he recently had a stent placed. Pt states he stopped taking Xanax abruptly in November. Pt is wanting help to control his anxiety.

## 2015-04-19 NOTE — Discharge Instructions (Signed)
You were evaluated in the emergency room today for shortness of breath and anxiety. Your EKG, blood tests, and CT scan of the chest do not show any acute abnormalities. You appear to be in your usual state of health. Please follow-up with your doctor at [facility] for continued management of your anxiety.  Generalized Anxiety Disorder Generalized anxiety disorder (GAD) is a mental disorder. It interferes with life functions, including relationships, work, and school. GAD is different from normal anxiety, which everyone experiences at some point in their lives in response to specific life events and activities. Normal anxiety actually helps [place] prepare for and get through these life events and activities. Normal anxiety goes away after the event or activity is over.  GAD causes anxiety that is not necessarily related to specific events or activities. It also causes excess anxiety in proportion to specific events or activities. The anxiety associated with GAD is also difficult to control. GAD can vary from mild to severe. People with severe GAD can have intense waves of anxiety with physical symptoms (panic attacks).  SYMPTOMS The anxiety and worry associated with GAD are difficult to control. This anxiety and worry are related to many life events and activities and also occur more days than not for 6 months or longer. People with GAD also have three or more of the following symptoms (one or more in children):  Restlessness.   Fatigue.  Difficulty concentrating.   Irritability.  Muscle tension.  Difficulty sleeping or unsatisfying sleep. DIAGNOSIS GAD is diagnosed through an assessment by your health care provider. Your health care provider will ask you questions aboutyour mood,physical symptoms, and events in your life. Your health care provider may ask you about your medical history and use of alcohol or drugs, including prescription medicines. Your health care provider may also do a physical exam  and blood tests. Certain medical conditions and the use of certain substances can cause symptoms similar to those associated with GAD. Your health care provider may refer you to a mental health specialist for further evaluation. TREATMENT The following therapies are usually used to treat GAD:   Medication. Antidepressant medication usually is prescribed for long-term daily control. Antianxiety medicines may be added in severe cases, especially when panic attacks occur.   Talk therapy (psychotherapy). Certain types of talk therapy can be helpful in treating GAD by providing support, education, and guidance. A form of talk therapy called cognitive behavioral therapy can teach you healthy ways to think about and react to daily life events and activities.  Stress managementtechniques. These include yoga, meditation, and exercise and can be very helpful when they are practiced regularly. A mental health specialist can help determine which treatment is best for you. Some people see improvement with one therapy. However, other people require a combination of therapies.   This information is not intended to replace advice given to you by your health care provider. Make sure you discuss any questions you have with your health care provider.   Document Released: 07/31/2012 Document Revised: 04/26/2014 Document Reviewed: 07/31/2012 Elsevier Interactive Patient Education [employer].

## 2015-04-19 NOTE — ED Notes (Signed)
Reports being dc from here on 12/22 after having NSTEMI.  States today having palpations and fast breathing.  Skin w/d with good color at this time

## 2015-04-19 NOTE — ED Provider Notes (Signed)
[facility] Emergency Department Provider Note  ____________________________________________  Time seen: 5:35 PM  I have reviewed the triage vital signs and the nursing notes.   HISTORY  Chief Complaint Shortness of Breath and Palpitations    HPI Larry French is a 54 y.o. male who complains of anxiety and shortness of breath as well as some sharp chest pain on the bilateral anterior chest. He reports that he recently had a heart attack with a stent placement. He is compliant with his medications.  States that his anxiety feels worse recently because his parents of kicked him out of their home until he gets help for his mental health. He feels very anxious since his brother died 9 months ago. No recent trauma travel hospitalizations or surgeries. He does report that he used to take Xanax chronically several times a day, and recently his doctors have taken him off the Xanax. He feels that antidepressants are not adequately controlling his anxiety and requests Ativan. Denies suicidality or homicidality or hallucinations.   Chest pain is not exertional, not pleuritic. No associated nausea vomiting diaphoresis dizziness or syncope. He does have the shortness of breath as previously described. Chest pain is been constant for 24 hours.  Past Medical History  Diagnosis Date  . Alcohol abuse     Recovered x 15 months  . CA - skin cancer   . Mental disorder   . Headache(784.0)   . Anxiety   . Depression   . CAD, residual CFX LAD disease 06/01/2011    a. s/p inferior ST elevation MI s/p PCI/DES to RCA on 05/29/2011; b. residual LAD and LCx CAD tx'd on 06/02/11 cath with LAD CAD successfully tx'd w/ PCI/DES, LCx managaged medically   . Hypertension   . Hyperlipemia   . PTSD (post-traumatic stress disorder)   . Polysubstance abuse     a. etoh, xanax, tobacco  . MI (myocardial infarction) [facility])      Patient Active Problem List   Diagnosis Date Noted  .  Alcohol abuse 04/17/2015  . Benzodiazepine abuse 04/17/2015  . Posttraumatic stress disorder 04/17/2015  . Suicidal ideation 04/17/2015  . Abnormal EKG   . Benzodiazepine withdrawal ([place])   . NSTEMI (non-ST elevated myocardial infarction) ([place])   . Positive cardiac stress test   . Elevated troponin   . Withdrawal from benzodiazepine ([place]) 04/05/2015  . STEMI, RCA DES 05/28/10 06/01/2011  . 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 Tobacco abuse 05/29/2011  . History of ETOH abuse and Xanax abuse. In rehab pta 05/29/2011     Past Surgical History  Procedure Laterality Date  . Tonsillectomy and adenoidectomy    . Left heart cath Right 05/29/2011    Procedure: LEFT HEART CATH;  Surgeon: Leonie Man, MD;  Location: [facility] CATH LAB;  Service: Cardiovascular;  Laterality: Right;  . Left heart catheterization with coronary angiogram [name] 06/01/2011    Procedure: LEFT HEART CATHETERIZATION WITH CORONARY ANGIOGRAM;  Surgeon: Leonie Man, MD;  Location: [facility] CATH LAB;  Service: Cardiovascular;  Laterality: [name];  relook cath, possible PCI  . Cardiac catheterization Bilateral 04/09/2015    Procedure: Coronary Angiogram;  Surgeon: Wellington Hampshire, MD;  Location: [place] CV LAB;  Service: Cardiovascular;  Laterality: Bilateral;  . Cardiac catheterization [name] 04/09/2015    Procedure: Coronary Stent Intervention;  Surgeon: Wellington Hampshire, MD;  Location: [place] CV LAB;  Service: Cardiovascular;  Laterality: [name];     Current Outpatient Rx  Name  Route  Sig  Dispense  Refill  . aspirin EC 81 MG EC tablet   Oral   Take 1 tablet (81 mg total) by mouth daily.         . clopidogrel (PLAVIX) 75 MG tablet   Oral   Take 1 tablet (75 mg total) by mouth daily.   60 tablet   1   . losartan (COZAAR) 25 MG tablet   Oral   Take 1 tablet (25 mg total) by mouth daily.   60 tablet   1   . metoprolol tartrate (LOPRESSOR) 25 MG tablet   Oral   Take 1 tablet (25 mg total) by mouth 2  (two) times daily.   60 tablet   1   . nitroGLYCERIN (NITROSTAT) 0.4 MG SL tablet   Sublingual   Place 0.4 mg under the tongue every 5 (five) minutes as needed for chest pain.         Marland Kitchen QUEtiapine (SEROQUEL) 100 MG tablet   Oral   Take 2 tablets (200 mg total) by mouth at bedtime.   60 tablet   0   . simvastatin (ZOCOR) 40 MG tablet   Oral   Take 40 mg by mouth at bedtime.          . busPIRone (BUSPAR) 5 MG tablet   Oral   Take 2 tablets (10 mg total) by mouth 2 (two) times daily. Patient not taking: Reported on 04/19/2015   30 tablet   0   . chlordiazePOXIDE (LIBRIUM) 25 MG capsule   Oral   Take 1 capsule (25 mg total) by mouth daily. Patient not taking: Reported on 04/16/2015   5 capsule   0   . QUEtiapine (SEROQUEL) 200 MG tablet   Oral   Take 1 tablet (200 mg total) by mouth at bedtime. Patient not taking: Reported on 04/19/2015   60 tablet   0      Allergies Review of patient's allergies indicates no known allergies.   Family History  Problem Relation Age of Onset  . Coronary artery disease Maternal Grandfather 53    Died    Social History Social History  Substance Use Topics  . Smoking status: Current Every Day Smoker -- 1.50 packs/day for 30 years    Types: Cigarettes  . Smokeless tobacco: None  . Alcohol Use: Yes     Comment: this week    Review of Systems  Constitutional:   No fever or chills. No weight changes Eyes:   No blurry vision or double vision.  ENT:   No sore throat. Cardiovascular:   Positive as above chest pain. Respiratory:   Positive shortness of breath, no cough. Gastrointestinal:   Negative for abdominal pain, vomiting and diarrhea.  No BRBPR or melena. Genitourinary:   Negative for dysuria, urinary retention, bloody urine, or difficulty urinating. Musculoskeletal:   Negative for back pain. No joint swelling or pain. Skin:   Negative for rash. Neurological:   Negative for headaches, focal weakness or  numbness. Psychiatric: Positive anxiety.   Endocrine:  No hot/cold intolerance, changes in energy, or sleep difficulty.  10-point ROS otherwise negative.  ____________________________________________   PHYSICAL EXAM:  VITAL SIGNS: ED Triage Vitals  Enc Vitals Group     BP 04/19/15 1435 135/84 mmHg     Pulse Rate 04/19/15 1435 86     Resp 04/19/15 1435 24     Temp 04/19/15 1435 97.6 F (36.4 C)     Temp Source 04/19/15 1435  Oral     SpO2 04/19/15 1435 100 %     Weight 04/19/15 1435 200 lb (90.719 kg)     Height 04/19/15 1435 5\' 8"  (1.727 m)     Head Cir --      Peak Flow --      Pain Score 04/19/15 1434 10     Pain Loc --      Pain Edu? --      Excl. in [place]? --     Vital signs reviewed, nursing assessments reviewed.   Constitutional:   Alert and oriented. Well appearing and in no distress. Eyes:   No scleral icterus. No conjunctival pallor. PERRL. EOMI ENT   Head:   Normocephalic and atraumatic.   Nose:   No congestion/rhinnorhea. No septal hematoma   Mouth/Throat:   MMM, no pharyngeal erythema. No peritonsillar mass. No uvula shift.   Neck:   No stridor. No SubQ emphysema. No meningismus. Hematological/Lymphatic/Immunilogical:   No cervical lymphadenopathy. Cardiovascular:   RRR. Normal and symmetric distal pulses are present in all extremities. No murmurs, rubs, or gallops. Respiratory:   Normal respiratory effort without tachypnea nor retractions. Breath sounds are clear and equal bilaterally. No wheezes/rales/rhonchi. Gastrointestinal:   Soft and nontender. No distention. There is no CVA tenderness.  No rebound, rigidity, or guarding. Genitourinary:   deferred Musculoskeletal:   Nontender with normal range of motion in all extremities. No joint effusions.  No lower extremity tenderness.  No edema. Neurologic:   Normal speech and language.  CN 2-10 normal. Motor grossly intact. No pronator drift.  Normal gait. No gross focal neurologic deficits are  appreciated.  Skin:    Skin is warm, dry and intact. No rash noted.  No petechiae, purpura, or bullae. Psychiatric:   Anxious mood. Normal affect. Speech and behavior are normal. Patient exhibits appropriate insight and judgment.  ____________________________________________    LABS (pertinent positives/negatives) (all labs ordered are listed, but only abnormal results are displayed) Labs Reviewed  CBC WITH DIFFERENTIAL/PLATELET - Abnormal; Notable for the following:    RDW 17.8 (*)    Neutro Abs 6.9 (*)    All other components within normal limits  COMPREHENSIVE METABOLIC PANEL - Abnormal; Notable for the following:    Potassium 3.4 (*)    Glucose, Bld 112 (*)    AST 49 (*)    Alkaline Phosphatase 158 (*)    All other components within normal limits  FIBRIN DERIVATIVES D-DIMER ([facility] ONLY) - Abnormal; Notable for the following:    Fibrin derivatives D-dimer ([facility]) 828 (*)    All other components within normal limits  TROPONIN I   ____________________________________________   EKG  Interpreted by me Normal sinus rhythm rate of 97, normal axis intervals and QRS. T wave inversions in 3 and aVF. Normal ST segments. Unchanged from 04/18/2015 ____________________________________________    RADIOLOGY  CT angiography chest unremarkable  ____________________________________________   PROCEDURES   ____________________________________________   INITIAL IMPRESSION / ASSESSMENT AND PLAN / ED COURSE  Pertinent labs & imaging results that were available during my care of the patient were reviewed by me and considered in my medical decision making (see chart for details).  Patient presents with shortness of breath and severe anxiety. With his other medical history we'll do labs including troponin and d-dimer. We'll give IV Ativan for anxiety in the meantime.  ----------------------------------------- 7:15 PM on 04/19/2015 ----------------------------------------- D-dimer  elevated we'll perform CT angiogram of the chest.  ----------------------------------------- 9:58 PM on 04/19/2015 ----------------------------------------- CT negative. Patient  requests to stay overnight to see psychiatry tomorrow. He indicates that he does not think he can get back to the [place] rescue [facility] where he has been staying tonight and he has nowhere else to stay and has no motor transportation or money or anyone to call. He requests to stay in his room and watched TV and received subsequent doses of benzodiazepines until tomorrow. He also requested be voluntarily committed to [place].  I told him this is not possible. Review of the record shows that he was recently in the emergency department with intoxication and was discharged to the rescue mission when he became sober enough. He has a history of alcohol and benzodiazepine abuse. On reporting to him the findings including psychiatry evaluation 2 days ago for the same, and that he'll need to be discharged, he suddenly called the [place] [facility] and very calmly and jovially discussed with them his need for a ride back to the rescue mission tonight.  His behavior is highly consistent with malingering with seeking behavior for benzodiazepines as well as using the emergency department for housing. I discussed with the patient that we are unable to accommodate these requests. He has a doctor at [facility] that he follows up with and has an appointment on 04/25/2015. I encouraged him to follow up with this appointment or to see them sooner on their walk-in hours. No evidence of any acute medical issue, psychiatrically stable. We'll discharge home.     ____________________________________________   FINAL CLINICAL IMPRESSION(S) / ED DIAGNOSES  Final diagnoses:  Benzodiazepine abuse  Anxiety  Malingering      Carrie Mew, MD 04/19/15 2200

## 2015-04-22 ENCOUNTER — Emergency Department (HOSPITAL_COMMUNITY)
Admission: EM | Admit: 2015-04-22 | Discharge: 2015-04-22 | Disposition: A | Discharge status: Home / Self Care | Payer: Self-pay | Attending: Emergency Medicine | Admitting: Emergency Medicine

## 2015-04-22 ENCOUNTER — Inpatient Hospital Stay (HOSPITAL_COMMUNITY)
Admission: RE | Admit: 2015-04-22 | Discharge: 2015-04-29 | DRG: 897 | Disposition: A | Discharge status: Rehabilitation Facility | Payer: No Typology Code available for payment source | Attending: Psychiatry | Admitting: Psychiatry

## 2015-04-22 ENCOUNTER — Encounter (HOSPITAL_COMMUNITY): Payer: Self-pay | Admitting: Emergency Medicine

## 2015-04-22 DIAGNOSIS — R45851 Suicidal ideations: Secondary | ICD-10-CM | POA: Diagnosis present

## 2015-04-22 DIAGNOSIS — F410 Panic disorder [episodic paroxysmal anxiety] without agoraphobia: Secondary | ICD-10-CM | POA: Diagnosis present

## 2015-04-22 DIAGNOSIS — F4310 Post-traumatic stress disorder, unspecified: Secondary | ICD-10-CM | POA: Insufficient documentation

## 2015-04-22 DIAGNOSIS — Z8249 Family history of ischemic heart disease and other diseases of the circulatory system: Secondary | ICD-10-CM

## 2015-04-22 DIAGNOSIS — Z955 Presence of coronary angioplasty implant and graft: Secondary | ICD-10-CM

## 2015-04-22 DIAGNOSIS — E785 Hyperlipidemia, unspecified: Secondary | ICD-10-CM | POA: Diagnosis present

## 2015-04-22 DIAGNOSIS — F329 Major depressive disorder, single episode, unspecified: Secondary | ICD-10-CM | POA: Insufficient documentation

## 2015-04-22 DIAGNOSIS — Z85828 Personal history of other malignant neoplasm of skin: Secondary | ICD-10-CM | POA: Insufficient documentation

## 2015-04-22 DIAGNOSIS — Z008 Encounter for other general examination: Secondary | ICD-10-CM | POA: Insufficient documentation

## 2015-04-22 DIAGNOSIS — I2510 Atherosclerotic heart disease of native coronary artery without angina pectoris: Secondary | ICD-10-CM | POA: Insufficient documentation

## 2015-04-22 DIAGNOSIS — F332 Major depressive disorder, recurrent severe without psychotic features: Secondary | ICD-10-CM | POA: Diagnosis present

## 2015-04-22 DIAGNOSIS — Z818 Family history of other mental and behavioral disorders: Secondary | ICD-10-CM

## 2015-04-22 DIAGNOSIS — Z9889 Other specified postprocedural states: Secondary | ICD-10-CM | POA: Insufficient documentation

## 2015-04-22 DIAGNOSIS — F17210 Nicotine dependence, cigarettes, uncomplicated: Secondary | ICD-10-CM | POA: Diagnosis present

## 2015-04-22 DIAGNOSIS — M545 Low back pain: Secondary | ICD-10-CM | POA: Diagnosis present

## 2015-04-22 DIAGNOSIS — Z590 Homelessness: Secondary | ICD-10-CM

## 2015-04-22 DIAGNOSIS — I252 Old myocardial infarction: Secondary | ICD-10-CM | POA: Insufficient documentation

## 2015-04-22 DIAGNOSIS — F411 Generalized anxiety disorder: Secondary | ICD-10-CM | POA: Diagnosis present

## 2015-04-22 DIAGNOSIS — F1310 Sedative, hypnotic or anxiolytic abuse, uncomplicated: Secondary | ICD-10-CM | POA: Insufficient documentation

## 2015-04-22 DIAGNOSIS — Z7902 Long term (current) use of antithrombotics/antiplatelets: Secondary | ICD-10-CM | POA: Insufficient documentation

## 2015-04-22 DIAGNOSIS — Z7982 Long term (current) use of aspirin: Secondary | ICD-10-CM | POA: Insufficient documentation

## 2015-04-22 DIAGNOSIS — I10 Essential (primary) hypertension: Secondary | ICD-10-CM | POA: Insufficient documentation

## 2015-04-22 DIAGNOSIS — Z139 Encounter for screening, unspecified: Secondary | ICD-10-CM

## 2015-04-22 DIAGNOSIS — F419 Anxiety disorder, unspecified: Secondary | ICD-10-CM | POA: Insufficient documentation

## 2015-04-22 DIAGNOSIS — G4700 Insomnia, unspecified: Secondary | ICD-10-CM | POA: Diagnosis present

## 2015-04-22 DIAGNOSIS — F1014 Alcohol abuse with alcohol-induced mood disorder: Principal | ICD-10-CM | POA: Diagnosis present

## 2015-04-22 DIAGNOSIS — F1320 Sedative, hypnotic or anxiolytic dependence, uncomplicated: Secondary | ICD-10-CM | POA: Diagnosis present

## 2015-04-22 DIAGNOSIS — Z79899 Other long term (current) drug therapy: Secondary | ICD-10-CM | POA: Insufficient documentation

## 2015-04-22 LAB — RAPID URINE DRUG SCREEN, HOSP PERFORMED
AMPHETAMINES: NOT DETECTED
BARBITURATES: NOT DETECTED
BENZODIAZEPINES: POSITIVE — AB
Cocaine: NOT DETECTED
Opiates: NOT DETECTED
TETRAHYDROCANNABINOL: NOT DETECTED

## 2015-04-22 LAB — CBC
HEMATOCRIT: 44.9 % (ref 39.0–52.0)
HEMOGLOBIN: 14.9 g/dL (ref 13.0–17.0)
MCH: 33.4 pg (ref 26.0–34.0)
MCHC: 33.2 g/dL (ref 30.0–36.0)
MCV: 100.7 fL — AB (ref 78.0–100.0)
Platelets: 229 10*3/uL (ref 150–400)
RBC: 4.46 MIL/uL (ref 4.22–5.81)
RDW: 16.5 % — AB (ref 11.5–15.5)
WBC: 8.7 10*3/uL (ref 4.0–10.5)

## 2015-04-22 LAB — COMPREHENSIVE METABOLIC PANEL
ALT: 53 U/L (ref 17–63)
AST: 34 U/L (ref 15–41)
Albumin: 4.1 g/dL (ref 3.5–5.0)
Alkaline Phosphatase: 168 U/L — ABNORMAL HIGH (ref 38–126)
Anion gap: 14 (ref 5–15)
BILIRUBIN TOTAL: 0.3 mg/dL (ref 0.3–1.2)
CALCIUM: 8.9 mg/dL (ref 8.9–10.3)
CO2: 19 mmol/L — ABNORMAL LOW (ref 22–32)
Chloride: 106 mmol/L (ref 101–111)
Creatinine, Ser: 0.9 mg/dL (ref 0.61–1.24)
GLUCOSE: 103 mg/dL — AB (ref 65–99)
POTASSIUM: 3.4 mmol/L — AB (ref 3.5–5.1)
Sodium: 139 mmol/L (ref 135–145)
TOTAL PROTEIN: 7.1 g/dL (ref 6.5–8.1)

## 2015-04-22 LAB — ACETAMINOPHEN LEVEL: Acetaminophen (Tylenol), Serum: <10 ug/mL — ABNORMAL LOW (ref 10–30)

## 2015-04-22 LAB — ETHANOL: Alcohol, Ethyl (B): 118 mg/dL — ABNORMAL HIGH (ref <5)

## 2015-04-22 LAB — SALICYLATE LEVEL: Salicylate Lvl: <4 mg/dL (ref 2.8–30.0)

## 2015-04-22 MED ORDER — LORAZEPAM 1 MG PO TABS
1.0000 mg | ORAL_TABLET | Freq: Three times a day (TID) | ORAL | Status: AC
  Administered 2015-04-24 – 2015-04-25 (×3): 1 mg via ORAL
  Filled 2015-04-22 (×3): qty 1

## 2015-04-22 MED ORDER — NITROGLYCERIN 0.4 MG SL SUBL
0.4000 mg | SUBLINGUAL_TABLET | SUBLINGUAL | Status: DC | PRN
  Filled 2015-04-22: qty 25

## 2015-04-22 MED ORDER — NICOTINE 21 MG/24HR TD PT24
21.0000 mg | MEDICATED_PATCH | Freq: Every day | TRANSDERMAL | Status: DC
  Administered 2015-04-22: 21 mg via TRANSDERMAL
  Filled 2015-04-22: qty 1

## 2015-04-22 MED ORDER — ONDANSETRON 4 MG PO TBDP: 4.0000 mg | ORAL_TABLET | Freq: Four times a day (QID) | ORAL | Status: DC | PRN

## 2015-04-22 MED ORDER — LORAZEPAM 1 MG PO TABS: 1.0000 mg | ORAL_TABLET | Freq: Every day | ORAL | Status: DC

## 2015-04-22 MED ORDER — HYDROXYZINE HCL 25 MG PO TABS
25.0000 mg | ORAL_TABLET | Freq: Four times a day (QID) | ORAL | Status: AC | PRN
  Administered 2015-04-23 – 2015-04-25 (×7): 25 mg via ORAL
  Filled 2015-04-22 (×7): qty 1

## 2015-04-22 MED ORDER — ALUM & MAG HYDROXIDE-SIMETH 200-200-20 MG/5ML PO SUSP
30.0000 mL | ORAL | Status: DC | PRN
  Administered 2015-04-27: 30 mL via ORAL
  Filled 2015-04-22: qty 30

## 2015-04-22 MED ORDER — SIMVASTATIN 40 MG PO TABS
40.0000 mg | ORAL_TABLET | Freq: Every morning | ORAL | Status: DC
  Administered 2015-04-23 – 2015-04-29 (×7): 40 mg via ORAL
  Filled 2015-04-22: qty 1
  Filled 2015-04-22: qty 2
  Filled 2015-04-22: qty 1
  Filled 2015-04-22 (×2): qty 2
  Filled 2015-04-22: qty 1
  Filled 2015-04-22 (×2): qty 2
  Filled 2015-04-22 (×3): qty 1
  Filled 2015-04-22: qty 7

## 2015-04-22 MED ORDER — ASPIRIN EC 81 MG PO TBEC
81.0000 mg | DELAYED_RELEASE_TABLET | Freq: Every day | ORAL | Status: DC
  Administered 2015-04-23 – 2015-04-29 (×7): 81 mg via ORAL
  Filled 2015-04-22 (×8): qty 1
  Filled 2015-04-22: qty 7
  Filled 2015-04-22: qty 1

## 2015-04-22 MED ORDER — LORAZEPAM 1 MG PO TABS
1.0000 mg | ORAL_TABLET | Freq: Four times a day (QID) | ORAL | Status: AC
  Administered 2015-04-22 – 2015-04-23 (×5): 1 mg via ORAL
  Filled 2015-04-22 (×5): qty 1

## 2015-04-22 MED ORDER — CLOPIDOGREL BISULFATE 75 MG PO TABS
75.0000 mg | ORAL_TABLET | Freq: Every day | ORAL | Status: DC
  Administered 2015-04-23 – 2015-04-29 (×7): 75 mg via ORAL
  Filled 2015-04-22 (×3): qty 1
  Filled 2015-04-22: qty 7
  Filled 2015-04-22 (×6): qty 1

## 2015-04-22 MED ORDER — THIAMINE HCL 100 MG/ML IJ SOLN
100.0000 mg | Freq: Once | INTRAMUSCULAR | Status: DC
  Filled 2015-04-22: qty 2

## 2015-04-22 MED ORDER — LORAZEPAM 1 MG PO TABS
2.0000 mg | ORAL_TABLET | Freq: Once | ORAL | Status: AC
  Administered 2015-04-22: 2 mg via ORAL
  Filled 2015-04-22: qty 2

## 2015-04-22 MED ORDER — LORAZEPAM 1 MG PO TABS
1.0000 mg | ORAL_TABLET | Freq: Four times a day (QID) | ORAL | Status: DC | PRN
  Administered 2015-04-23 – 2015-04-25 (×5): 1 mg via ORAL
  Filled 2015-04-22 (×5): qty 1

## 2015-04-22 MED ORDER — METOPROLOL TARTRATE 25 MG PO TABS
25.0000 mg | ORAL_TABLET | Freq: Two times a day (BID) | ORAL | Status: DC
  Administered 2015-04-22 – 2015-04-29 (×14): 25 mg via ORAL
  Filled 2015-04-22 (×7): qty 1
  Filled 2015-04-22: qty 14
  Filled 2015-04-22 (×5): qty 1
  Filled 2015-04-22: qty 14
  Filled 2015-04-22 (×6): qty 1

## 2015-04-22 MED ORDER — MAGNESIUM HYDROXIDE 400 MG/5ML PO SUSP: 30.0000 mL | Freq: Every day | ORAL | Status: DC | PRN

## 2015-04-22 MED ORDER — ACETAMINOPHEN 325 MG PO TABS
650.0000 mg | ORAL_TABLET | Freq: Four times a day (QID) | ORAL | Status: DC | PRN
  Administered 2015-04-23 – 2015-04-29 (×7): 650 mg via ORAL
  Filled 2015-04-22 (×7): qty 2

## 2015-04-22 MED ORDER — QUETIAPINE FUMARATE 200 MG PO TABS
200.0000 mg | ORAL_TABLET | Freq: Every day | ORAL | Status: DC
  Administered 2015-04-22 – 2015-04-25 (×4): 200 mg via ORAL
  Filled 2015-04-22 (×7): qty 1

## 2015-04-22 MED ORDER — ADULT MULTIVITAMIN W/MINERALS CH
1.0000 | ORAL_TABLET | Freq: Every day | ORAL | Status: DC
  Administered 2015-04-23 – 2015-04-25 (×3): 1 via ORAL
  Filled 2015-04-22 (×6): qty 1

## 2015-04-22 MED ORDER — LORAZEPAM 1 MG PO TABS: 1.0000 mg | ORAL_TABLET | Freq: Two times a day (BID) | ORAL | Status: DC

## 2015-04-22 MED ORDER — LOPERAMIDE HCL 2 MG PO CAPS: 2.0000 mg | ORAL_CAPSULE | ORAL | Status: AC | PRN

## 2015-04-22 MED ORDER — VITAMIN B-1 100 MG PO TABS
100.0000 mg | ORAL_TABLET | Freq: Every day | ORAL | Status: DC
  Administered 2015-04-23 – 2015-04-25 (×3): 100 mg via ORAL
  Filled 2015-04-22 (×5): qty 1

## 2015-04-22 MED ORDER — LOSARTAN POTASSIUM 25 MG PO TABS
25.0000 mg | ORAL_TABLET | Freq: Every day | ORAL | Status: DC
  Administered 2015-04-23 – 2015-04-29 (×7): 25 mg via ORAL
  Filled 2015-04-22 (×6): qty 1
  Filled 2015-04-22: qty 7

## 2015-04-22 NOTE — ED Provider Notes (Signed)
CSN: [id number]     Arrival date & time 04/22/15  1810 History   First MD Initiated Contact with Patient 04/22/15 1858     Chief Complaint  Patient presents with  . Suicidal  . Medical Clearance     (Consider location/radiation/quality/duration/timing/severity/associated sxs/prior Treatment) HPI Comments: Patient evaluated at [facility] as a walk-in today, has been accepted for admission.  Sent to ED for MSE.  Patient is a 55 y.o. male presenting with mental health disorder. The history is provided by the patient. No language interpreter was used.  Mental Health Problem Presenting symptoms: suicidal thoughts   Onset quality:  Gradual Duration:  6 months Progression:  Worsening Context: alcohol use and stressful life event   Associated symptoms: feelings of worthlessness     Past Medical History  Diagnosis Date  . Alcohol abuse     Recovered x 15 months  . CA - skin cancer   . Mental disorder   . Headache(784.0)   . Anxiety   . Depression   . CAD, residual CFX LAD disease 06/01/2011    a. s/p inferior ST elevation MI s/p PCI/DES to RCA on 05/29/2011; b. residual LAD and LCx CAD tx'd on 06/02/11 cath with LAD CAD successfully tx'd w/ PCI/DES, LCx managaged medically   . Hypertension   . Hyperlipemia   . PTSD (post-traumatic stress disorder)   . Polysubstance abuse     a. etoh, xanax, tobacco  . MI (myocardial infarction) [facility])    Past Surgical History  Procedure Laterality Date  . Tonsillectomy and adenoidectomy    . Left heart cath Right 05/29/2011    Procedure: LEFT HEART CATH;  Surgeon: Leonie Man, MD;  Location: [facility] CATH LAB;  Service: Cardiovascular;  Laterality: Right;  . Left heart catheterization with coronary angiogram [name] 06/01/2011    Procedure: LEFT HEART CATHETERIZATION WITH CORONARY ANGIOGRAM;  Surgeon: Leonie Man, MD;  Location: [facility] CATH LAB;  Service: Cardiovascular;  Laterality: [name];  relook cath, possible PCI  . Cardiac catheterization Bilateral 04/09/2015     Procedure: Coronary Angiogram;  Surgeon: Wellington Hampshire, MD;  Location: [place] CV LAB;  Service: Cardiovascular;  Laterality: Bilateral;  . Cardiac catheterization [name] 04/09/2015    Procedure: Coronary Stent Intervention;  Surgeon: Wellington Hampshire, MD;  Location: [place] CV LAB;  Service: Cardiovascular;  Laterality: [name];   Family History  Problem Relation Age of Onset  . Coronary artery disease Maternal Grandfather 97    Died   Social History  Substance Use Topics  . Smoking status: Current Every Day Smoker -- 1.50 packs/day for 30 years    Types: Cigarettes  . Smokeless tobacco: Not on file  . Alcohol Use: Yes     Comment: this week    Review of Systems  Psychiatric/Behavioral: Positive for suicidal ideas.  All other systems reviewed and are negative.     Allergies  Review of patient's allergies indicates no known allergies.  Home Medications   Prior to Admission medications   Medication Sig Start Date End Date Taking? Authorizing Provider  aspirin EC 81 MG EC tablet Take 1 tablet (81 mg total) by mouth daily. 04/08/15  Yes Henreitta Leber, MD  clopidogrel (PLAVIX) 75 MG tablet Take 1 tablet (75 mg total) by mouth daily. 04/10/15  Yes Henreitta Leber, MD  losartan (COZAAR) 25 MG tablet Take 1 tablet (25 mg total) by mouth daily. 04/10/15  Yes Henreitta Leber, MD  metoprolol tartrate (LOPRESSOR) 25 MG tablet  Take 1 tablet (25 mg total) by mouth 2 (two) times daily. 04/10/15  Yes Henreitta Leber, MD  nitroGLYCERIN (NITROSTAT) 0.4 MG SL tablet Place 0.4 mg under the tongue every 5 (five) minutes as needed for chest pain.   Yes Historical Provider, MD  QUEtiapine (SEROQUEL) 100 MG tablet Take 2 tablets (200 mg total) by mouth at bedtime. 04/17/15  Yes Gonzella Lex, MD  simvastatin (ZOCOR) 40 MG tablet Take 40 mg by mouth every morning.    Yes Historical Provider, MD  busPIRone (BUSPAR) 5 MG tablet Take 2 tablets (10 mg total) by mouth 2 (two) times  daily. Patient not taking: Reported on 04/19/2015 04/10/15   Henreitta Leber, MD  chlordiazePOXIDE (LIBRIUM) 25 MG capsule Take 1 capsule (25 mg total) by mouth daily. Patient not taking: Reported on 04/16/2015 04/10/15   Henreitta Leber, MD  QUEtiapine (SEROQUEL) 200 MG tablet Take 1 tablet (200 mg total) by mouth at bedtime. Patient not taking: Reported on 04/19/2015 04/17/15   Gonzella Lex, MD   BP 132/83 mmHg  Pulse 97  Temp(Src) 98.6 F (37 C) (Oral)  Resp 18  SpO2 96% Physical Exam  Constitutional: He is oriented to person, place, and time. He appears well-developed and well-nourished.  HENT:  Head: Normocephalic.  Eyes: Conjunctivae are normal.  Neck: Neck supple.  Cardiovascular: Normal rate and regular rhythm.   Pulmonary/Chest: Effort normal and breath sounds normal.  Abdominal: Soft.  Musculoskeletal: He exhibits no edema or tenderness.  Lymphadenopathy:    He has no cervical adenopathy.  Neurological: He is alert and oriented to person, place, and time.  Skin: Skin is warm and dry.  Psychiatric: He has a normal mood and affect.  Nursing note and vitals reviewed.   ED Course  Procedures (including critical care time) Labs Review Labs Reviewed  COMPREHENSIVE METABOLIC PANEL - Abnormal; Notable for the following:    Potassium 3.4 (*)    CO2 19 (*)    Glucose, Bld 103 (*)    BUN <5 (*)    Alkaline Phosphatase 168 (*)    All other components within normal limits  ETHANOL - Abnormal; Notable for the following:    Alcohol, Ethyl (B) 118 (*)    All other components within normal limits  ACETAMINOPHEN LEVEL - Abnormal; Notable for the following:    Acetaminophen (Tylenol), Serum <10 (*)    All other components within normal limits  CBC - Abnormal; Notable for the following:    MCV 100.7 (*)    RDW 16.5 (*)    All other components within normal limits  URINE RAPID DRUG SCREEN, HOSP PERFORMED - Abnormal; Notable for the following:    Benzodiazepines POSITIVE  (*)    All other components within normal limits  SALICYLATE LEVEL    Imaging Review No results found. I have personally reviewed and evaluated these images and lab results as part of my medical decision-making.   EKG Interpretation None      MDM   Final diagnoses:  None    Medical screening exam completed. Patient is cleared to return to [facility] per plan.    Etta Quill, NP 04/23/15 [place], MD 04/25/15 [phone]

## 2015-04-22 NOTE — H&P (Signed)
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 Alcohol abuse with alcohol-induced mood disorder (HCC) [F10.14] 04/22/2015  . Alcohol abuse [F10.10] 04/17/2015  . Benzodiazepine abuse [F13.10] 04/17/2015  . Posttraumatic stress disorder [F43.10] 04/17/2015  . Suicidal ideation [R45.851] 04/17/2015  . Abnormal EKG [R94.31]   . Benzodiazepine withdrawal (HCC) [F13.239]   . NSTEMI (non-ST elevated myocardial infarction) (HCC) [I21.4]   . Positive cardiac stress test [R94.39]   . Elevated troponin [R79.89]   . Withdrawal from benzodiazepine (HCC) [F13.239] 04/05/2015  . STEMI, RCA DES 05/28/10 [I21.3] 06/01/2011  . CAD, residual CFX LAD disease [I25.10] 06/01/2011  . Tobacco abuse [Z72.0] 05/29/2011  . History of ETOH abuse and Xanax abuse. In rehab pta [F10.10] 05/29/2011   History of Present Illness: Larry French is a 55 y/o WM with long standing hx of MDD and GAD managed as an outpatient x 10 years, currently with [facility] ( Dr Suzie Portela). The patient is endorsing passive SI x 2 weeks precipitated  By current homelessness, financial problems and being unemployed. Larry French had been clean, dry and sober x 5 years prior to his recent alcohol binging and self medicating with alcohol to wean of off Xanax. The patient is denying any hx of prior DT's or Seizure disorder. Larry French is also endorsing racing thoughts, ruminating but is denying any intrusive thoughts, AVH, paranoia or delusional thoughts. Associated Signs/Symptoms: Depression Symptoms:  depressed mood, feelings of worthlessness/guilt, suicidal thoughts without plan, (Hypo) Manic Symptoms:  [name] Anxiety Symptoms:  Excessive Worry, Panic Symptoms, Psychotic  Symptoms:  denies PTSD Symptoms: Had a traumatic exposure:  in the past Total Time spent with patient: 30 minutes  Past Psychiatric History: MDD, GAD  Risk to Self: Suicidal Ideation: No-Not Currently/Within Last 6 Months Suicidal Intent: No-Not Currently/Within Last 6 Months Is patient at risk for suicide?: No Suicidal Plan?: No Specify Current Suicidal Plan: no plan specified Access to Means: No What has been your use of drugs/alcohol within the last 12 months?: see above Intentional Self Injurious Behavior: None Risk to Others: Homicidal Ideation: No Thoughts of Harm to Others: No Current Homicidal Intent: No Current Homicidal Plan: No Access to Homicidal Means: No History of harm to others?: No Assessment of Violence: None Noted Does patient have access to weapons?: No Criminal Charges Pending?: No Does patient have a court date: No Prior Inpatient Therapy: Prior Inpatient Therapy: Yes Prior Therapy Dates: 2014 Prior Therapy Facilty/Provider(s): [facility] Reason for Treatment: Detox Prior Outpatient Therapy: Prior Outpatient Therapy: Yes Prior Therapy Dates: Currently Prior Therapy Facilty/Provider(s): [facility] Reason for Treatment: Depression Does patient have an ACCT team?: No Does patient have Intensive In-House Services?  : No Does patient have [facility] services? : No Does patient have P4CC services?: No  Alcohol Screening:   Substance Abuse History in the last 12 months:  Yes.   Consequences of Substance Abuse: Withdrawal Symptoms:   Cramps Diaphoresis Tremors Previous Psychotropic Medications: Yes  Psychological Evaluations: Yes  Past Medical History:  Past Medical History  Diagnosis Date  . Alcohol abuse     Recovered x 15 months  . CA - skin cancer   . Mental disorder   . Headache(784.0)   . Anxiety   . Depression   . CAD, residual CFX LAD  disease 06/01/2011    a. s/p inferior ST elevation MI s/p PCI/DES to RCA on 05/29/2011; b. residual LAD and LCx CAD tx'd  on 06/02/11 cath with LAD CAD successfully tx'd w/ PCI/DES, LCx managaged medically   . Hypertension   . Hyperlipemia   . PTSD (post-traumatic stress disorder)   . Polysubstance abuse     a. etoh, xanax, tobacco  . MI (myocardial infarction) [facility])     Past Surgical History  Procedure Laterality Date  . Tonsillectomy and adenoidectomy    . Left heart cath Right 05/29/2011    Procedure: LEFT HEART CATH;  Surgeon: Leonie Man, MD;  Location: [facility] CATH LAB;  Service: Cardiovascular;  Laterality: Right;  . Left heart catheterization with coronary angiogram [name] 06/01/2011    Procedure: LEFT HEART CATHETERIZATION WITH CORONARY ANGIOGRAM;  Surgeon: Leonie Man, MD;  Location: [facility] CATH LAB;  Service: Cardiovascular;  Laterality: [name];  relook cath, possible PCI  . Cardiac catheterization Bilateral 04/09/2015    Procedure: Coronary Angiogram;  Surgeon: Wellington Hampshire, MD;  Location: [place] CV LAB;  Service: Cardiovascular;  Laterality: Bilateral;  . Cardiac catheterization [name] 04/09/2015    Procedure: Coronary Stent Intervention;  Surgeon: Wellington Hampshire, MD;  Location: [place] CV LAB;  Service: Cardiovascular;  Laterality: [name];   Family History:  Family History  Problem Relation Age of Onset  . Coronary artery disease Maternal Grandfather 67    Died   Family Psychiatric  History: Unremarkable Social History:  History  Alcohol Use  . Yes    Comment: this week     History  Drug Use No    Social History   Social History  . Marital Status: Single    Spouse Name: [name]  . Number of Children: 0  . Years of Education: [name]   Occupational History  . Student    Social History Main Topics  . Smoking status: Current Every Day Smoker -- 1.50 packs/day for 30 years    Types: Cigarettes  . Smokeless tobacco: None  . Alcohol Use: Yes     Comment: this week  . Drug Use: No  . Sexual Activity: No   Other Topics Concern  . None   Social History Narrative   Additional  Social History:    Pain Medications: see PTA Prescriptions: see PTA Over the Counter: see PTA History of alcohol / drug use?: Yes Longest period of sobriety (when/how long): 4-5 years Negative Consequences of Use: Personal relationships Name of Substance 1: ETOH 1 - Age of First Use: 15 1 - Amount (size/oz): 2-3 bottles of wine 1 - Frequency: daily 1 - Duration: 3 days straight 1 - Last Use / Amount: this morning                  Allergies:  No Known Allergies Lab Results:  Results for orders placed or performed during the hospital encounter of 04/22/15 (from the past 48 hour(s))  Comprehensive metabolic panel     Status: Abnormal   Collection Time: 04/22/15  7:04 PM  Result Value Ref Range   Sodium 139 135 - 145 mmol/L   Potassium 3.4 (L) 3.5 - 5.1 mmol/L   Chloride 106 101 - 111 mmol/L   CO2 19 (L) 22 - 32 mmol/L   Glucose, Bld 103 (H) 65 - 99 mg/dL   BUN <5 (L) 6 - 20 mg/dL   Creatinine, Ser 0.90 0.61 - 1.24 mg/dL   Calcium 8.9 8.9 - 10.3  mg/dL   Total Protein 7.1 6.5 - 8.1 g/dL   Albumin 4.1 3.5 - 5.0 g/dL   AST 34 15 - 41 U/L   ALT 53 17 - 63 U/L   Alkaline Phosphatase 168 (H) 38 - 126 U/L   Total Bilirubin 0.3 0.3 - 1.2 mg/dL   GFR calc non Af Amer >60 >60 mL/min   GFR calc Af Amer >60 >60 mL/min    Comment: (NOTE) The eGFR has been calculated using the CKD EPI equation. This calculation has not been validated in all clinical situations. eGFR's persistently <60 mL/min signify possible Chronic Kidney Disease.    Anion gap 14 5 - 15  Ethanol (ETOH)     Status: Abnormal   Collection Time: 04/22/15  7:04 PM  Result Value Ref Range   Alcohol, Ethyl (B) 118 (H) <5 mg/dL    Comment:        LOWEST DETECTABLE LIMIT FOR SERUM ALCOHOL IS 5 mg/dL FOR MEDICAL PURPOSES ONLY   Salicylate level     Status: None   Collection Time: 04/22/15  7:04 PM  Result Value Ref Range   Salicylate Lvl <0.6 2.8 - 30.0 mg/dL  Acetaminophen level     Status: Abnormal    Collection Time: 04/22/15  7:04 PM  Result Value Ref Range   Acetaminophen (Tylenol), Serum <10 (L) 10 - 30 ug/mL    Comment:        THERAPEUTIC CONCENTRATIONS VARY SIGNIFICANTLY. A RANGE OF 10-30 ug/mL MAY BE AN EFFECTIVE CONCENTRATION FOR MANY PATIENTS. HOWEVER, SOME ARE BEST TREATED AT CONCENTRATIONS OUTSIDE THIS RANGE. ACETAMINOPHEN CONCENTRATIONS >150 ug/mL AT 4 HOURS AFTER INGESTION AND >50 ug/mL AT 12 HOURS AFTER INGESTION ARE OFTEN ASSOCIATED WITH TOXIC REACTIONS.   CBC     Status: Abnormal   Collection Time: 04/22/15  7:04 PM  Result Value Ref Range   WBC 8.7 4.0 - 10.5 K/uL   RBC 4.46 4.22 - 5.81 MIL/uL   Hemoglobin 14.9 13.0 - 17.0 g/dL   HCT 44.9 39.0 - 52.0 %   MCV 100.7 (H) 78.0 - 100.0 fL   MCH 33.4 26.0 - 34.0 pg   MCHC 33.2 30.0 - 36.0 g/dL   RDW 16.5 (H) 11.5 - 15.5 %   Platelets 229 150 - 400 K/uL  Urine rapid drug screen (hosp performed) (Not at [facility])     Status: Abnormal   Collection Time: 04/22/15  7:31 PM  Result Value Ref Range   Opiates NONE DETECTED NONE DETECTED   Cocaine NONE DETECTED NONE DETECTED   Benzodiazepines POSITIVE (A) NONE DETECTED   Amphetamines NONE DETECTED NONE DETECTED   Tetrahydrocannabinol NONE DETECTED NONE DETECTED   Barbiturates NONE DETECTED NONE DETECTED    Comment:        DRUG SCREEN FOR MEDICAL PURPOSES ONLY.  IF CONFIRMATION IS NEEDED FOR ANY PURPOSE, NOTIFY LAB WITHIN 5 DAYS.        LOWEST DETECTABLE LIMITS FOR URINE DRUG SCREEN Drug Class       Cutoff (ng/mL) Amphetamine      1000 Barbiturate      200 Benzodiazepine   237 Tricyclics       628 Opiates          300 Cocaine          300 THC              50     Metabolic Disorder Labs:  Lab Results  Component Value Date   HGBA1C 5.0 04/06/2015  No results found for: PROLACTIN Lab Results  Component Value Date   CHOL 158 04/06/2015   TRIG 234* 04/06/2015   HDL 44 04/06/2015   CHOLHDL 3.6 04/06/2015   VLDL 47* 04/06/2015   LDLCALC 67 04/06/2015    LDLCALC 98 06/01/2011    Current Medications: Current Facility-Administered Medications  Medication Dose Route Frequency Provider Last Rate Last Dose  . acetaminophen (TYLENOL) tablet 650 mg  650 mg Oral Q6H PRN Laverle Hobby, PA-C      . alum & mag hydroxide-simeth (MAALOX/MYLANTA) 200-200-20 MG/5ML suspension 30 mL  30 mL Oral Q4H PRN Laverle Hobby, PA-C      . [START ON 04/23/2015] aspirin EC tablet 81 mg  81 mg Oral Daily Laverle Hobby, PA-C      . [START ON 04/23/2015] clopidogrel (PLAVIX) tablet 75 mg  75 mg Oral Daily Laverle Hobby, PA-C      . hydrOXYzine (ATARAX/VISTARIL) tablet 25 mg  25 mg Oral Q6H PRN Laverle Hobby, PA-C      . loperamide (IMODIUM) capsule 2-4 mg  2-4 mg Oral PRN Laverle Hobby, PA-C      . LORazepam (ATIVAN) tablet 1 mg  1 mg Oral Q6H PRN Laverle Hobby, PA-C      . LORazepam (ATIVAN) tablet 1 mg  1 mg Oral QID Laverle Hobby, PA-C       Followed by  . [START ON 04/24/2015] LORazepam (ATIVAN) tablet 1 mg  1 mg Oral TID Laverle Hobby, PA-C       Followed by  . [START ON 04/25/2015] LORazepam (ATIVAN) tablet 1 mg  1 mg Oral BID Laverle Hobby, PA-C       Followed by  . [START ON 04/27/2015] LORazepam (ATIVAN) tablet 1 mg  1 mg Oral Daily Laverle Hobby, PA-C      . [START ON 04/23/2015] losartan (COZAAR) tablet 25 mg  25 mg Oral Daily Brannan Cassedy E Erric Machnik, PA-C      . magnesium hydroxide (MILK OF MAGNESIA) suspension 30 mL  30 mL Oral Daily PRN Laverle Hobby, PA-C      . metoprolol tartrate (LOPRESSOR) tablet 25 mg  25 mg Oral BID Laverle Hobby, PA-C      . [START ON 04/23/2015] multivitamin with minerals tablet 1 tablet  1 tablet Oral Daily Laverle Hobby, PA-C      . nitroGLYCERIN (NITROSTAT) SL tablet 0.4 mg  0.4 mg Sublingual Q5 min PRN Laverle Hobby, PA-C      . ondansetron (ZOFRAN-ODT) disintegrating tablet 4 mg  4 mg Oral Q6H PRN Laverle Hobby, PA-C      . QUEtiapine (SEROQUEL) tablet 200 mg  200 mg Oral QHS Laverle Hobby, PA-C      . [START  ON 04/23/2015] simvastatin (ZOCOR) tablet 40 mg  40 mg Oral q morning - 10a Jameia Makris E Constance Whittle, PA-C      . thiamine (B-1) injection 100 mg  100 mg Intramuscular Once Laverle Hobby, PA-C      . [START ON 04/23/2015] thiamine (VITAMIN B-1) tablet 100 mg  100 mg Oral Daily Laverle Hobby, PA-C       PTA Medications: Prescriptions prior to admission  Medication Sig Dispense Refill Last Dose  . aspirin EC 81 MG EC tablet Take 1 tablet (81 mg total) by mouth daily.   Past Week at Unknown time  . busPIRone (BUSPAR) 5 MG tablet Take 2 tablets (10 mg  total) by mouth 2 (two) times daily. (Patient not taking: Reported on 04/19/2015) 30 tablet 0 Not Taking at Unknown time  . chlordiazePOXIDE (LIBRIUM) 25 MG capsule Take 1 capsule (25 mg total) by mouth daily. (Patient not taking: Reported on 04/16/2015) 5 capsule 0 Completed Course at Unknown time  . clopidogrel (PLAVIX) 75 MG tablet Take 1 tablet (75 mg total) by mouth daily. 60 tablet 1 04/22/2015 at Unknown time  . losartan (COZAAR) 25 MG tablet Take 1 tablet (25 mg total) by mouth daily. 60 tablet 1 04/22/2015 at Unknown time  . metoprolol tartrate (LOPRESSOR) 25 MG tablet Take 1 tablet (25 mg total) by mouth 2 (two) times daily. 60 tablet 1 04/22/2015 at 0700  . nitroGLYCERIN (NITROSTAT) 0.4 MG SL tablet Place 0.4 mg under the tongue every 5 (five) minutes as needed for chest pain.   04/18/2015  . QUEtiapine (SEROQUEL) 100 MG tablet Take 2 tablets (200 mg total) by mouth at bedtime. 60 tablet 0 04/21/2015 at Unknown time  . QUEtiapine (SEROQUEL) 200 MG tablet Take 1 tablet (200 mg total) by mouth at bedtime. (Patient not taking: Reported on 04/19/2015) 60 tablet 0 Not Taking at Unknown time  . simvastatin (ZOCOR) 40 MG tablet Take 40 mg by mouth every morning.    04/22/2015 at Unknown time    Musculoskeletal: Strength & Muscle Tone: within normal limits Gait & Station: normal Patient leans: [name]  Psychiatric Specialty Exam: Physical Exam  Nursing note and  vitals reviewed. Constitutional: He is oriented to person, place, and time. He appears well-developed and well-nourished. No distress.  HENT:  Head: Normocephalic.  Respiratory: Effort normal and breath sounds normal.  Neurological: He is alert and oriented to person, place, and time. No cranial nerve deficit.  Skin: Skin is warm and dry. He is not diaphoretic.  Psychiatric:  See Psych Assesment    Review of Systems  Constitutional: Positive for chills and diaphoresis.  Eyes: Negative.   Respiratory: Negative.   Cardiovascular: Negative.   Gastrointestinal: Negative.   Genitourinary: Negative.   Musculoskeletal: Negative.   Skin: Negative.   Neurological: Positive for tremors, weakness and headaches.  Endo/Heme/Allergies: Negative.   Psychiatric/Behavioral: Positive for depression, suicidal ideas and substance abuse. The patient is nervous/anxious.     Blood pressure 117/91, pulse 111, temperature 97.8 F (36.6 C), temperature source Oral, height 5\' 8"  (1.727 m), weight 84.369 kg (186 lb), SpO2 100 %.Body mass index is 28.29 kg/(m^2).  General Appearance: Disheveled  Eye Contact::  Good  Speech:  Slurred  Volume:  Normal  Mood:  Depressed  Affect:  Congruent  Thought Process:  Circumstantial  Orientation:  Full (Time, Place, and Person)  Thought Content:  Negative  Suicidal Thoughts:  Yes.  without intent/plan  Homicidal Thoughts:  No  Memory:  Immediate;   Good  Judgement:  Fair  Insight:  Fair  Psychomotor Activity:  Normal  Concentration:  Fair  Recall:  Fair  Fund of Knowledge:Fair  Language: Good  Akathisia:  No  Handed:  Right  AIMS (if indicated):     Assets:  Desire for Improvement  ADL's:  Intact  Cognition: WNL  Sleep:  Number of Hours: 6     Treatment Plan Summary: Plan continued safety, stabilization and crises intervention via admission in [facility] Observation Unit. Further plans for disposition per TTS in am  Observation Level/Precautions:  Continuous  Observation  Laboratory:    Psychotherapy:    Medications:    Consultations:    Discharge Concerns:  Estimated LOS: 24 - 48 hours  Other:     I certify that inpatient services furnished can reasonably be expected to improve the patient's condition.   Devante Capano E 1/3/201710:29 PM

## 2015-04-22 NOTE — ED Notes (Signed)
Report called to Richardson Landry in [place] at [facility].

## 2015-04-22 NOTE — ED Notes (Signed)
Unable to obtain blood draw in right A/C. Pt doesn't want to be stuck in left arm.

## 2015-04-22 NOTE — Progress Notes (Signed)
ADMISSION NOTE: Larry French, 55 yo male.  Upon admission interview, pt is agitated, restless, sts "hurry up and get me to my bed".  Sts he is anxious and depressed since the death of his younger brother in an auto accident.  Sts he is not able to handle pressures of life right now.  Recently "bouncing" from one place to another and is now homeless.  Sts he stopped taking his Xanax because he heard he couldn't get into a group home if he was on it.  Self medicating with 3 bottles of wine per day x 3 days.  Pt has advised this Probation officer of the "meds" he takes and requires that we follow his instructions.  Pt. Wants Ativan 1 mg every 30 min. Until it works or up to 5 mg.  On unit, pt given 1 mg Ativan and 15 min. Later is complaining that it is not working.  Continues to gripe and complain that Ativan not working.  Pt to the point where he is asking for more Ativan every few minuets.  Received one time order for 2 mg PO.  Heavy smoke at 1.5 packs per day.  Nicoderm patch on with 21 mg strength.  Pt is attempting to sleep at this time.
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
## 2015-04-22 NOTE — Discharge Instructions (Signed)
Medical Screening Exam A medical screening exam has been done. This exam helps find the cause of your problem and determines whether you need emergency treatment. Your exam has shown that you do not need emergency treatment at this point. It is safe for you to go to report to [facility] as planned. Depending on your illness, your symptoms and condition can change over time. If your condition gets worse or you develop new or troubling symptoms before you see your caregiver, you should return to the emergency department for further evaluation.    This information is not intended to replace advice given to you by your health care provider. Make sure you discuss any questions you have with your health care provider.   Document Released: 05/13/2004 Document Revised: 04/26/2014 Document Reviewed: 12/23/2010 Elsevier Interactive Patient Education [employer].

## 2015-04-22 NOTE — ED Notes (Signed)
Pt comes from [facility] for medical clearance. States that he is very anxious and feels like he would harm himself. States he has been drinking 3 bottles of wine a day to compensate for the xanax that he d/c in December. Alert and oriented.

## 2015-04-23 DIAGNOSIS — F1014 Alcohol abuse with alcohol-induced mood disorder: Secondary | ICD-10-CM | POA: Diagnosis not present

## 2015-04-23 DIAGNOSIS — R45851 Suicidal ideations: Secondary | ICD-10-CM

## 2015-04-23 MED ORDER — NICOTINE 21 MG/24HR TD PT24
MEDICATED_PATCH | TRANSDERMAL | Status: AC
  Administered 2015-04-23: 17:00:00
  Filled 2015-04-23: qty 1

## 2015-04-23 MED ORDER — NICOTINE 21 MG/24HR TD PT24
21.0000 mg | MEDICATED_PATCH | Freq: Every day | TRANSDERMAL | Status: DC
  Administered 2015-04-23 – 2015-04-26 (×4): 21 mg via TRANSDERMAL
  Filled 2015-04-23 (×5): qty 1
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
## 2015-04-23 NOTE — Progress Notes (Signed)
Patient ID: Larry French, male   DOB: 1960-11-07, 55 y.o.   MRN: [id number] Patient has complained of withdrawal symptoms throughout the day while awake. CIWA 10 at noon and at 1700. PRNs given as scheduled throughout the day.  Patient slept much of the day but showed withdrawal symptoms and complained of them whenever awake.

## 2015-04-23 NOTE — Progress Notes (Signed)
Elmyra Ricks Shift note: Pt. Noted in bed.  Introduced my self to pt. No complaints or concerns voiced. No distress or abnormal behavior noted. Will continue to monitor for safety. Pt is continuously observed, except during bathroom usage.

## 2015-04-23 NOTE — BHH Counselor (Signed)
Pt currently denies SI/HI and states that he almost feels normal. Pt states that his medication (Ativan) keeps him calm and his anxiety is down. Pt was assessed and it was determined that he needs to remain in the OBS unit throughout the night and be reassessed and evaluated by A.M. extender. Pt needs resources to OPT services to assist with medication management and other detox options. Pt was provided support and encouragement while in the unit. Pt is interested in receiving Inpatient services at [facility] on the Adult unit. Pt shared that he really wants to get the help he needs and that a short term stay will not help him to get his life together.  Redmond Pulling, MA OBS Unit

## 2015-04-24 ENCOUNTER — Inpatient Hospital Stay (HOSPITAL_COMMUNITY): Admit: 2015-04-24 | Payer: Self-pay

## 2015-04-24 DIAGNOSIS — F4310 Post-traumatic stress disorder, unspecified: Secondary | ICD-10-CM | POA: Diagnosis present

## 2015-04-24 DIAGNOSIS — F17210 Nicotine dependence, cigarettes, uncomplicated: Secondary | ICD-10-CM | POA: Diagnosis present

## 2015-04-24 DIAGNOSIS — I252 Old myocardial infarction: Secondary | ICD-10-CM | POA: Diagnosis not present

## 2015-04-24 DIAGNOSIS — I10 Essential (primary) hypertension: Secondary | ICD-10-CM | POA: Diagnosis present

## 2015-04-24 DIAGNOSIS — F410 Panic disorder [episodic paroxysmal anxiety] without agoraphobia: Secondary | ICD-10-CM | POA: Diagnosis present

## 2015-04-24 DIAGNOSIS — F1014 Alcohol abuse with alcohol-induced mood disorder: Secondary | ICD-10-CM | POA: Diagnosis present

## 2015-04-24 DIAGNOSIS — Z818 Family history of other mental and behavioral disorders: Secondary | ICD-10-CM | POA: Diagnosis not present

## 2015-04-24 DIAGNOSIS — M545 Low back pain: Secondary | ICD-10-CM | POA: Diagnosis present

## 2015-04-24 DIAGNOSIS — Z8249 Family history of ischemic heart disease and other diseases of the circulatory system: Secondary | ICD-10-CM | POA: Diagnosis not present

## 2015-04-24 DIAGNOSIS — Z590 Homelessness: Secondary | ICD-10-CM | POA: Diagnosis not present

## 2015-04-24 DIAGNOSIS — I2510 Atherosclerotic heart disease of native coronary artery without angina pectoris: Secondary | ICD-10-CM | POA: Diagnosis present

## 2015-04-24 DIAGNOSIS — Z7982 Long term (current) use of aspirin: Secondary | ICD-10-CM | POA: Diagnosis not present

## 2015-04-24 DIAGNOSIS — F411 Generalized anxiety disorder: Secondary | ICD-10-CM | POA: Diagnosis present

## 2015-04-24 DIAGNOSIS — R45851 Suicidal ideations: Secondary | ICD-10-CM | POA: Diagnosis present

## 2015-04-24 DIAGNOSIS — F332 Major depressive disorder, recurrent severe without psychotic features: Secondary | ICD-10-CM | POA: Diagnosis not present

## 2015-04-24 DIAGNOSIS — Z7902 Long term (current) use of antithrombotics/antiplatelets: Secondary | ICD-10-CM | POA: Diagnosis not present

## 2015-04-24 DIAGNOSIS — F1320 Sedative, hypnotic or anxiolytic dependence, uncomplicated: Secondary | ICD-10-CM | POA: Diagnosis not present

## 2015-04-24 DIAGNOSIS — Z955 Presence of coronary angioplasty implant and graft: Secondary | ICD-10-CM | POA: Diagnosis not present

## 2015-04-24 DIAGNOSIS — G4700 Insomnia, unspecified: Secondary | ICD-10-CM | POA: Diagnosis present

## 2015-04-24 DIAGNOSIS — Z85828 Personal history of other malignant neoplasm of skin: Secondary | ICD-10-CM | POA: Diagnosis not present

## 2015-04-24 DIAGNOSIS — E785 Hyperlipidemia, unspecified: Secondary | ICD-10-CM | POA: Diagnosis present

## 2015-04-24 NOTE — Progress Notes (Signed)
Patient accepted to [facility] room 306-1. Clayborne Dana, RN

## 2015-04-24 NOTE — Tx Team (Signed)
Initial Interdisciplinary Treatment Plan   PATIENT STRESSORS: Financial difficulties Health problems Marital or family conflict Substance abuse   PATIENT STRENGTHS: Capable of independent living Curator fund of knowledge Motivation for treatment/growth Supportive family/friends   PROBLEM LIST: Problem List/Patient Goals Date to be addressed Date deferred Reason deferred Estimated date of resolution  " Depression with thoughts of not wanting to live anymore" 04/23/25     " I need help with my medications" 04/24/15                                                DISCHARGE CRITERIA:  Ability to meet basic life and health needs Adequate post-discharge living arrangements Improved stabilization in mood, thinking, and/or behavior Safe-care adequate arrangements made  PRELIMINARY DISCHARGE PLAN: Outpatient therapy Placement in alternative living arrangements  PATIENT/FAMIILY INVOLVEMENT: This treatment plan has been presented to and reviewed with the patient, Larry French, and/or family member,.  The patient and family have been given the opportunity to ask questions and make suggestions.  Maureen Chatters [facility] 04/24/2015, 3:05 PM

## 2015-04-24 NOTE — Discharge Summary (Signed)
OBS Discharge Summary Note  Patient:  Larry French is an 55 y.o., male MRN:  [id number] DOB:  [birth year] Patient phone:  [phone] (home)  Patient address:   [address]  [place] [address],  Total Time spent with patient: 30 minutes  Date of Admission:  04/22/2015 Date of Discharge:  04/24/2015  Reason for Admission:  SI, depression  Principal Problem: Alcohol abuse with alcohol-induced mood disorder [facility]) Discharge Diagnoses: Patient Active Problem List   Diagnosis Date Noted  . Alcohol abuse with alcohol-induced mood disorder ([place]) [F10.14] 04/22/2015    Priority: High  . Severe episode of recurrent major depressive disorder, without psychotic features ([place]) [F33.2]     Priority: High  . GAD (generalized anxiety disorder) [F41.1]   . Alcohol abuse [F10.10] 04/17/2015  . Benzodiazepine abuse [F13.10] 04/17/2015  . Posttraumatic stress disorder [F43.10] 04/17/2015  . Suicidal ideation [R45.851] 04/17/2015  . Abnormal EKG [R94.31]   . Benzodiazepine withdrawal ([place]) [F13.239]   . NSTEMI (non-ST elevated myocardial infarction) ([place]) [I21.4]   . Positive cardiac stress test [R94.39]   . Elevated troponin [R79.89]   . Withdrawal from benzodiazepine ([place]) [F13.239] 04/05/2015  . STEMI, RCA DES 05/28/10 [I21.3] 06/01/2011  . CAD, residual CFX LAD disease [I25.10] 06/01/2011  . Tobacco abuse [Z72.0] 05/29/2011  . History of ETOH abuse and Xanax abuse. In rehab pta [F10.10] 05/29/2011    Past Psychiatric History:  See above  Past Medical History:  Past Medical History  Diagnosis Date  . Alcohol abuse     Recovered x 15 months  . CA - skin cancer   . Mental disorder   . Headache(784.0)   . Anxiety   . Depression   . CAD, residual CFX LAD disease 06/01/2011    a. s/p inferior ST elevation MI s/p PCI/DES to RCA on 05/29/2011; b. residual LAD and LCx CAD tx'd on 06/02/11 cath with LAD CAD successfully tx'd w/ PCI/DES, LCx managaged medically   . Hypertension   .  Hyperlipemia   . PTSD (post-traumatic stress disorder)   . Polysubstance abuse     a. etoh, xanax, tobacco  . MI (myocardial infarction) [facility])     Past Surgical History  Procedure Laterality Date  . Tonsillectomy and adenoidectomy    . Left heart cath Right 05/29/2011    Procedure: LEFT HEART CATH;  Surgeon: Leonie Man, MD;  Location: [facility] CATH LAB;  Service: Cardiovascular;  Laterality: Right;  . Left heart catheterization with coronary angiogram [name] 06/01/2011    Procedure: LEFT HEART CATHETERIZATION WITH CORONARY ANGIOGRAM;  Surgeon: Leonie Man, MD;  Location: [facility] CATH LAB;  Service: Cardiovascular;  Laterality: [name];  relook cath, possible PCI  . Cardiac catheterization Bilateral 04/09/2015    Procedure: Coronary Angiogram;  Surgeon: Wellington Hampshire, MD;  Location: [place] CV LAB;  Service: Cardiovascular;  Laterality: Bilateral;  . Cardiac catheterization [name] 04/09/2015    Procedure: Coronary Stent Intervention;  Surgeon: Wellington Hampshire, MD;  Location: [place] CV LAB;  Service: Cardiovascular;  Laterality: [name];   Family History:  Family History  Problem Relation Age of Onset  . Coronary artery disease Maternal Grandfather 51    Died   Family Psychiatric  History:  Denies Social History:  History  Alcohol Use  . Yes    Comment: this week     History  Drug Use No    Social History   Social History  . Marital Status: Single    Spouse Name: [name]  .  Number of Children: 0  . Years of Education: [name]   Occupational History  . Student    Social History Main Topics  . Smoking status: Current Every Day Smoker -- 1.50 packs/day for 30 years    Types: Cigarettes  . Smokeless tobacco: None  . Alcohol Use: Yes     Comment: this week  . Drug Use: No  . Sexual Activity: No   Other Topics Concern  . None   Social History Narrative    Hospital Course:  Patient is a 55 yr old male that was a walk-in. Increased depression due to death of brother this year.   He is still endorsing some SI and anxiety. Started back drinking after being sober many years. Drinking 3 bottles of wine x 3 days in a row prior to coming in. Was on xanax but got off recently because he was trying to get into group home.  He was shakey and felt dizziness with some diaphoresis.  Will admit to [facility] inpatient.  Physical Findings: AIMS: Facial and Oral Movements Muscles of Facial Expression: None, normal Lips and Perioral Area: None, normal Jaw: None, normal Tongue: None, normal,Extremity Movements Upper (arms, wrists, hands, fingers): None, normal Lower (legs, knees, ankles, toes): None, normal, Trunk Movements Neck, shoulders, hips: None, normal, Overall Severity Severity of abnormal movements (highest score from questions above): None, normal Incapacitation due to abnormal movements: None, normal Patient's awareness of abnormal movements (rate only patient's report): No Awareness, Dental Status Current problems with teeth and/or dentures?: No Does patient usually wear dentures?: Yes (Pt has lowers at bedside)  CIWA:  CIWA-Ar Total: 7 COWS:     Musculoskeletal: Strength & Muscle Tone: within normal limits Gait & Station: normal Patient leans: Backward and [name]  Psychiatric Specialty Exam: Review of Systems  All other systems reviewed and are negative.   Blood pressure 144/97, pulse 104, temperature 98.3 F (36.8 C), temperature source Oral, resp. rate 16, height 5\' 8"  (1.727 m), weight 83.915 kg (185 lb), SpO2 97 %.Body mass index is 28.14 kg/(m^2).   General Appearance: Fairly Groomed  Engineer, water:: Fair  Speech: Clear and Coherent  Volume: Normal  Mood: Anxious and Depressed  Affect: anxious worried  Thought Process: Coherent and Goal Directed  Orientation: Full (Time, Place, and Person)  Thought Content: symptoms events worries concerns  Suicidal Thoughts: No  Homicidal Thoughts: No  Memory: Immediate; Fair Recent; Fair Remote;  Fair  Judgement: Fair  Insight: Present and Shallow  Psychomotor Activity: Restlessness  Concentration: Fair  Recall: [place]: Fair  Akathisia: No  Handed: Right  AIMS (if indicated):    Assets: Communication Skills Desire for Improvement Housing Intimacy Leisure Time Resilience Social Support  ADL's: Intact  Cognition: WNL  Sleep:  7   Has this patient used any form of tobacco in the last 30 days? (Cigarettes, Smokeless Tobacco, Cigars, and/or Pipes) Yes, [name]  Metabolic Disorder Labs:  Lab Results  Component Value Date   HGBA1C 5.0 04/06/2015   No results found for: PROLACTIN Lab Results  Component Value Date   CHOL 158 04/06/2015   TRIG 234* 04/06/2015   HDL 44 04/06/2015   CHOLHDL 3.6 04/06/2015   VLDL 47* 04/06/2015   LDLCALC 67 04/06/2015   [place] 98 06/01/2011    Discharge destination:  [facility] inpatient  Is patient on multiple antipsychotic therapies at discharge:  No   Has Patient had three or more failed trials of antipsychotic monotherapy by history:  No  Recommended Plan for Multiple Antipsychotic Therapies: NA     Medication List    ASK your doctor about these medications      Indication   aspirin 81 MG EC tablet  Take 1 tablet (81 mg total) by mouth daily.      busPIRone 5 MG tablet  Commonly known as:  BUSPAR  Take 2 tablets (10 mg total) by mouth 2 (two) times daily.      chlordiazePOXIDE 25 MG capsule  Commonly known as:  LIBRIUM  Take 1 capsule (25 mg total) by mouth daily.      clopidogrel 75 MG tablet  Commonly known as:  PLAVIX  Take 1 tablet (75 mg total) by mouth daily.      losartan 25 MG tablet  Commonly known as:  COZAAR  Take 1 tablet (25 mg total) by mouth daily.      metoprolol tartrate 25 MG tablet  Commonly known as:  LOPRESSOR  Take 1 tablet (25 mg total) by mouth 2 (two) times daily.      nitroGLYCERIN 0.4 MG SL tablet  Commonly known as:  NITROSTAT   Place 0.4 mg under the tongue every 5 (five) minutes as needed for chest pain.      QUEtiapine 100 MG tablet  Commonly known as:  SEROQUEL  Take 2 tablets (200 mg total) by mouth at bedtime.      QUEtiapine 200 MG tablet  Commonly known as:  SEROQUEL  Take 1 tablet (200 mg total) by mouth at bedtime.      simvastatin 40 MG tablet  Commonly known as:  ZOCOR  Take 40 mg by mouth every morning.         Follow-up recommendations:  Activity:  as tol Diet:  as tol  Comments:  [place] inpatient  Signed: Freda Munro May Shavona Gunderman AGNP-BC 04/24/2015, 5:47 PM

## 2015-04-24 NOTE — Progress Notes (Signed)
Pt wanting something to help him sleep.  Pt given ativan per prn order.  Pt asleep.

## 2015-04-24 NOTE — Progress Notes (Signed)
[place] Group Notes:  (Nursing/MHT/Case Management/Adjunct)  Date:  04/24/2015  Time:  2030 Type of Therapy:  wrap up group  Participation Level:  Active  Participation Quality:  Appropriate, Attentive, Sharing and Supportive  Affect:  Appropriate  Cognitive:  Appropriate  Insight:  Improving  Engagement in Group:  Engaged  Modes of Intervention:  Clarification, Education and Support  Summary of Progress/Problems: Pt reports having been homeless bouncing from place to place where ever a friend or family member had free couch or bed for him. Pt reports being a little anxious about the detox/withdrawal period but is aware of the "medication taper" to assist him. Pt plans to go to [facility] upon discharge.   Jacques Navy 04/24/2015, 10:14 PM

## 2015-04-24 NOTE — Progress Notes (Signed)
Patient ID: Larry French, male   DOB: Sep 16, 1960, 55 y.o.   MRN: [id number]  Patient is a 55 yr old male that was a walk-in. Increased depression due to death of brother this year. Endorsed some SI and anxiety. Started back drinking after being sober many years. Drinking 3 bottles of wine x 3 days in a row prior to coming in. Was on xanax but got off recently because he was trying to get into group home. Pleasant and cooperative on admission.

## 2015-04-25 ENCOUNTER — Encounter (HOSPITAL_COMMUNITY): Payer: Self-pay | Admitting: Psychiatry

## 2015-04-25 DIAGNOSIS — F1320 Sedative, hypnotic or anxiolytic dependence, uncomplicated: Secondary | ICD-10-CM | POA: Diagnosis present

## 2015-04-25 DIAGNOSIS — F411 Generalized anxiety disorder: Secondary | ICD-10-CM

## 2015-04-25 DIAGNOSIS — F332 Major depressive disorder, recurrent severe without psychotic features: Secondary | ICD-10-CM

## 2015-04-25 MED ORDER — ADULT MULTIVITAMIN W/MINERALS CH
1.0000 | ORAL_TABLET | Freq: Every day | ORAL | Status: DC
  Administered 2015-04-25 – 2015-04-29 (×5): 1 via ORAL
  Filled 2015-04-25 (×7): qty 1

## 2015-04-25 MED ORDER — CHLORDIAZEPOXIDE HCL 25 MG PO CAPS
25.0000 mg | ORAL_CAPSULE | Freq: Every day | ORAL | Status: AC
  Administered 2015-04-28: 25 mg via ORAL
  Filled 2015-04-25: qty 1

## 2015-04-25 MED ORDER — GABAPENTIN 300 MG PO CAPS
300.0000 mg | ORAL_CAPSULE | Freq: Three times a day (TID) | ORAL | Status: DC
  Administered 2015-04-25 – 2015-04-29 (×13): 300 mg via ORAL
  Filled 2015-04-25: qty 21
  Filled 2015-04-25: qty 1
  Filled 2015-04-25: qty 21
  Filled 2015-04-25 (×8): qty 1
  Filled 2015-04-25: qty 21
  Filled 2015-04-25 (×7): qty 1

## 2015-04-25 MED ORDER — HYDROXYZINE HCL 25 MG PO TABS
25.0000 mg | ORAL_TABLET | Freq: Four times a day (QID) | ORAL | Status: AC | PRN
  Administered 2015-04-26 – 2015-04-28 (×5): 25 mg via ORAL
  Filled 2015-04-25 (×6): qty 1

## 2015-04-25 MED ORDER — ONDANSETRON 4 MG PO TBDP: 4.0000 mg | ORAL_TABLET | Freq: Four times a day (QID) | ORAL | Status: AC | PRN

## 2015-04-25 MED ORDER — LOPERAMIDE HCL 2 MG PO CAPS: 2.0000 mg | ORAL_CAPSULE | ORAL | Status: DC | PRN

## 2015-04-25 MED ORDER — THIAMINE HCL 100 MG/ML IJ SOLN: 100.0000 mg | Freq: Once | INTRAMUSCULAR | Status: DC

## 2015-04-25 MED ORDER — CHLORDIAZEPOXIDE HCL 25 MG PO CAPS
25.0000 mg | ORAL_CAPSULE | Freq: Four times a day (QID) | ORAL | Status: AC | PRN
Start: 2015-04-25 — End: 2015-04-28

## 2015-04-25 MED ORDER — CHLORDIAZEPOXIDE HCL 25 MG PO CAPS
25.0000 mg | ORAL_CAPSULE | Freq: Four times a day (QID) | ORAL | Status: AC
  Administered 2015-04-25 (×3): 25 mg via ORAL
  Filled 2015-04-25 (×3): qty 1

## 2015-04-25 MED ORDER — CHLORDIAZEPOXIDE HCL 25 MG PO CAPS
25.0000 mg | ORAL_CAPSULE | Freq: Three times a day (TID) | ORAL | Status: AC
  Administered 2015-04-26 (×3): 25 mg via ORAL
  Filled 2015-04-25 (×3): qty 1

## 2015-04-25 MED ORDER — CHLORDIAZEPOXIDE HCL 25 MG PO CAPS
25.0000 mg | ORAL_CAPSULE | ORAL | Status: AC
  Administered 2015-04-27 (×2): 25 mg via ORAL
  Filled 2015-04-25 (×2): qty 1

## 2015-04-25 MED ORDER — CHLORDIAZEPOXIDE HCL 25 MG PO CAPS: 25.0000 mg | ORAL_CAPSULE | Freq: Four times a day (QID) | ORAL | Status: DC | PRN

## 2015-04-25 MED ORDER — ENSURE ENLIVE PO LIQD
237.0000 mL | Freq: Two times a day (BID) | ORAL | Status: DC
  Administered 2015-04-25 (×2): 237 mL via ORAL

## 2015-04-25 MED ORDER — GABAPENTIN 300 MG PO CAPS: 300.0000 mg | ORAL_CAPSULE | Freq: Three times a day (TID) | ORAL | Status: DC

## 2015-04-25 MED ORDER — VITAMIN B-1 100 MG PO TABS
100.0000 mg | ORAL_TABLET | Freq: Every day | ORAL | Status: DC
  Administered 2015-04-26 – 2015-04-29 (×4): 100 mg via ORAL
  Filled 2015-04-25 (×6): qty 1

## 2015-04-25 NOTE — Progress Notes (Signed)
Patient ID: Larry French, male   DOB: [birth year], 55 y.o.   MRN: [id number] D: Patient alert and cooperative. Pt detoxing  c/o tremors, nausea, and anxiety. Pt mood/affect appears anxious and sad. Pt denies SI/HI/AVH and pain. Pt observed interacting well with peers. No acute physical distress noted.  A: Medications administered as prescribed. Emotional support given and will continue to monitor pt's progress for stabilization. R: Patient remains safe and complaint with medications. Pt attended evening wrap up group.

## 2015-04-25 NOTE — BHH Suicide Risk Assessment (Signed)
[facility] Admission Suicide Risk Assessment   Nursing information obtained from:  Patient Demographic factors:  Male, Caucasian, Unemployed Current Mental Status:  Self-harm thoughts Loss Factors:  Loss of significant relationship, Financial problems / change in socioeconomic status Historical Factors:  Family history of mental illness or substance abuse Risk Reduction Factors:  Sense of responsibility to family, Religious beliefs about death, Positive social support Total Time spent with patient: 45 minutes Principal Problem: Alcohol abuse with alcohol-induced mood disorder ([place]) Diagnosis:   Patient Active Problem List   Diagnosis Date Noted  . Alcohol abuse with alcohol-induced mood disorder ([place]) [F10.14] 04/22/2015  . GAD (generalized anxiety disorder) [F41.1]   . Severe episode of recurrent major depressive disorder, without psychotic features ([place]) [F33.2]   . Alcohol abuse [F10.10] 04/17/2015  . Benzodiazepine abuse [F13.10] 04/17/2015  . Posttraumatic stress disorder [F43.10] 04/17/2015  . Suicidal ideation [R45.851] 04/17/2015  . Abnormal EKG [R94.31]   . Benzodiazepine withdrawal ([place]) [F13.239]   . NSTEMI (non-ST elevated myocardial infarction) ([employer]) [I21.4]   . Positive cardiac stress test [R94.39]   . Elevated troponin [R79.89]   . Withdrawal from benzodiazepine ([place]) [F13.239] 04/05/2015  . STEMI, RCA DES 05/28/10 [I21.3] 06/01/2011  . CAD, residual CFX LAD disease [I25.10] 06/01/2011  . Tobacco abuse [Z72.0] 05/29/2011  . History of ETOH abuse and Xanax abuse. In rehab pta [F10.10] 05/29/2011     Continued Clinical Symptoms:  Alcohol Use Disorder Identification Test Final Score (AUDIT): 21 The "Alcohol Use Disorders Identification Test", Guidelines for Use in Primary Care, Second Edition.  World Pharmacologist [facility]). Score between 0-7:  no or low risk or alcohol related problems. Score between 8-15:  moderate risk of alcohol related problems. Score between 16-19:   high risk of alcohol related problems. Score 20 or above:  warrants further diagnostic evaluation for alcohol dependence and treatment.   CLINICAL FACTORS:   Depression:   Comorbid alcohol abuse/dependence Alcohol/Substance Abuse/Dependencies  Psychiatric Specialty Exam: Physical Exam  ROS  Blood pressure 133/95, pulse 106, temperature 97.3 F (36.3 C), temperature source Oral, resp. rate 20, height 5\' 8"  (1.727 m), weight 83.915 kg (185 lb), SpO2 97 %.Body mass index is 28.14 kg/(m^2).    COGNITIVE FEATURES THAT CONTRIBUTE TO RISK:  Closed-mindedness, Polarized thinking and Thought constriction (tunnel vision)    SUICIDE RISK:   Moderate:  Frequent suicidal ideation with limited intensity, and duration, some specificity in terms of plans, no associated intent, good self-control, limited dysphoria/symptomatology, some risk factors present, and identifiable protective factors, including available and accessible social support.  PLAN OF CARE: see admission H and P  Medical Decision Making:  Review of Psycho-Social Stressors (1), Review or order clinical lab tests (1), Review of Medication Regimen & Side Effects (2) and Review of New Medication or Change in Dosage (2)  I certify that inpatient services furnished can reasonably be expected to improve the patient's condition.   Callery A 04/25/2015, 5:14 PM

## 2015-04-25 NOTE — Plan of Care (Signed)
Problem: Consults Goal: Depression Patient Education See Patient Education Module for education specifics.  Outcome: Progressing Nurse discussed depression/coping skills with patient.        

## 2015-04-25 NOTE — Plan of Care (Signed)
Problem: Diagnosis: Increased Risk For Suicide Attempt Goal: STG-Patient Will Attend All Groups On The Unit Outcome: Progressing Pt attended evening wrap up group     

## 2015-04-25 NOTE — BHH Group Notes (Signed)
[facility] LCSW Aftercare Discharge Planning Group Note   04/25/2015 1:28 PM  Participation Quality:  Invited. DID NOT ATTEND. Pt chose to remain in bed.   Smart, Larry Thomann LCSW
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
## 2015-04-25 NOTE — Progress Notes (Signed)
Adult Psychoeducational Group Note  Date:  04/25/2015 Time:  10:17 PM  Group Topic/Focus:  Wrap-Up Group:   The focus of this group is to help patients review their daily goal of treatment and discuss progress on daily workbooks.  Participation Level:  Active  Participation Quality:  Appropriate and Attentive  Affect:  Appropriate  Cognitive:  Appropriate  Insight: Appropriate  Engagement in Group:  Engaged  Modes of Intervention:  Discussion  Additional Comments:  Pt stated his goal for today was to get his medications straight, reduce his anxiety, and shower. Pt stated tomorrow he wants to watch the news and weather about the snow and to continue "working for me."  Larry French 04/25/2015, 10:17 PM

## 2015-04-25 NOTE — BHH Suicide Risk Assessment (Signed)
[place] INPATIENT:  Family/Significant Other Suicide Prevention Education  Suicide Prevention Education:  Patient Refusal for Family/Significant Other Suicide Prevention Education: The patient Larry French has refused to provide written consent for family/significant other to be provided Family/Significant Other Suicide Prevention Education during admission and/or prior to discharge.  Physician notified.  Beverely Pace 04/25/2015, 3:38 PM
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
## 2015-04-25 NOTE — BHH Group Notes (Signed)
[place] LCSW Group Therapy Feelings Around Relapse  04/25/2015 2:52 PM  Type of Therapy:  Group Therapy  Participation Level:  Active   Modes of Intervention:  Confrontation, Discussion, Education, Exploration, Problem-solving, Rapport Building, Socialization and Support  Summary of Progress/Problems. Group members discussed the meaning of relapse and shared personal stories of relapse, how it affected them and others, and how they perceived themselves during this time. Group members were encouraged to identify triggers, warning signs and coping skills used when facing the possibility of relapse. Social supports were discussed and explored in detail. HALT BS was introduced as a way to identify common triggers for relapse (hunger, anger, loneliness, tiredness, boredom and stress).    Patient described stress as being his major trigger, described how he became isolated from others and able to "buy a 40 on the way home and no one would know the difference", was stressed from taking care of elderly parents by himself.  States he finds that taking medications regularly and reaching out to friends for support are important ways for him to manage stress.   Beverely Pace LCSW 04/25/2015, 2:52 PM

## 2015-04-25 NOTE — Progress Notes (Signed)
NUTRITION ASSESSMENT  Pt identified as at risk on the Malnutrition Screen Tool  INTERVENTION: 1. Educated patient on the importance of nutrition and encouraged intake of food and beverages. 2. Discussed weight goals. 3. Supplements: will order Ensure Enlive po BID, each supplement provides 350 kcal and 20 grams of protein  NUTRITION DIAGNOSIS: Unintentional weight loss related to sub-optimal intake as evidenced by pt report.   Goal: Pt to meet >/= 90% of their estimated nutrition needs.  Monitor:  PO intake  Assessment:  Pt seen for MST. Pt admitted for alcohol abuse and alcohol-induced mood disorder. Per notes, pt is homeless. Pt has lost 15 lbs (7.5% body weight) in the past 6 days which is significant for time frame. Will order Ensure Enlive to assist during admission as pt may not be able to eat well for the first few days while going through withdrawal.   55 y.o. male  Height: Ht Readings from Last 1 Encounters:  04/24/15 5\' 8"  (1.727 m)    Weight: Wt Readings from Last 1 Encounters:  04/24/15 185 lb (83.915 kg)    Weight Hx: Wt Readings from Last 10 Encounters:  04/24/15 185 lb (83.915 kg)  04/19/15 200 lb (90.719 kg)  04/16/15 200 lb (90.719 kg)  04/05/15 200 lb (90.719 kg)  04/01/15 195 lb (88.451 kg)  03/24/15 190 lb (86.183 kg)  03/24/15 190 lb (86.183 kg)  01/28/15 195 lb (88.451 kg)  10/11/14 195 lb (88.451 kg)  05/29/11 216 lb 4.3 oz (98.1 kg)    BMI:  Body mass index is 28.14 kg/(m^2). Pt meets criteria for overweight based on current BMI.  Estimated Nutritional Needs: Kcal: 25-30 kcal/kg Protein: > 1 gram protein/kg Fluid: 1 ml/kcal  Diet Order: Diet regular Room service appropriate?: Yes; Fluid consistency:: Thin Pt is also offered choice of unit snacks mid-morning and mid-afternoon.  Pt is eating as desired.   Lab results and medications reviewed.     Jarome Matin, RD, LDN Inpatient Clinical Dietitian Pager # [phone] After  hours/weekend pager # [phone]

## 2015-04-25 NOTE — BHH Counselor (Signed)
PSA attempted, patient in bed and states he feels "woozy" and requests CSW return later in AM.  Edwyna Shell, [place] Worker Phone:  [phone]

## 2015-04-25 NOTE — Progress Notes (Signed)
D:  Patient's self inventory sheet, patient has fair sleep, sleep medication is helpful.  Good appetite, low energy level, poor concentration.  Rated depression and hopeless 7, anxiety 10.  Withdrawals, tremors, diarrhea, agitation.  Denied SI.  Physical problems lower back pain, lightheaded, headaches.  Worst pain in past 24 hours #5,.  No pain medications.  Goal is "acceptance of where I am.  Interact, don't overwhelm myself, rest."  No discharge plan.  Must meet with counselor to discuss options. A:  Medications administered per MD orders.  Emotional support and encouragement given patient. R:  Denied SI and HI, contracts for safety.  Denied A/V hallucinations.  Safety maintained with 15 minute checks.

## 2015-04-25 NOTE — Tx Team (Signed)
Interdisciplinary Treatment Plan Update (Adult)  Date:  04/25/2015  Time Reviewed:  8:55 AM   Progress in Treatment: Attending groups: No.  Participating in groups:  No. Taking medication as prescribed:  Yes. Tolerating medication:  Yes. Family/Significant othe contact made:   Patient understands diagnosis:  Yes. AEB seeking treatment for ETOH abuse, recent xanax abuse, depression, passive SI and med stabilization. Discussing patient identified problems/goals with staff:  Yes. Medical problems stabilized or resolved:  Yes. Denies suicidal/homicidal ideation: Yes. Issues/concerns per patient self-inventory:  Other:  Discharge Plan or Barriers: Pt had been living with parents. They found out he was drinking and kicked him out. Pt current with [facility] for outpatient med management and had appt scheduled for 1/6.   Reason for Continuation of Hospitalization: Depression Medication stabilization Withdrawal symptoms  Comments:  Larry French is an 55 y.o. male who presents voluntarily to [facility] as a walk in. Pt reported "I need help. Lost everything. Lil brother died 2014-07-27 in car crash. Not handling life any more. Gave up." Pt reported that he was in a "crisis" and needed help "right now". Pt did not endorse SI, indicating that it was "on the table", but that he was "not actively pursuing it". Pt denied HI and AVH. Pt's mood and affect was apathetic and pleasant. His speech was slightly tangential and hard to follow, at times. He was oriented x 4. Pt indicated that he has had a problem with alcoholism for many years and was sober for 4-5 years. He reported being prescribed Xanax, to help with his depression and becoming hooked on it. He continued that he stopped taking the Xanax @ 04/03/15 and started back drinking to compensate. Pt stated he last had a drink this morning and was starting to feel DTs coming on. Pt also reported that he was living with his parents and once they discovered he was  drinking again, they kicked him out of the house. Pt reported having repeated panic attacks during the day. He stated that he was med compliant and that he has an appt with [facility] on 1/6th for med mgmt, but felt that they would not give him what he needed. Diagnosis: Alcohol Use Disorder, moderate  Estimated length of stay:  3-5 days   New goal(s): to develop effective aftercare plan.   Additional Comments:  Patient and CSW reviewed pt's identified goals and treatment plan. Patient verbalized understanding and agreed to treatment plan. CSW reviewed [facility] "Discharge Process and Patient Involvement" Form. Pt verbalized understanding of information provided and signed form.    Review of initial/current patient goals per problem list:  1. Goal(s): Patient will participate in aftercare plan  Met: No.   Target date: at discharge  As evidenced by: Patient will participate within aftercare plan AEB aftercare provider and housing plan at discharge being identified.  1/6: CSW assessing for appropriate referrals.   2. Goal (s): Patient will exhibit decreased depressive symptoms and suicidal ideations.  Met: No.    Target date: at discharge  As evidenced by: Patient will utilize self rating of depression at 3 or below and demonstrate decreased signs of depression or be deemed stable for discharge by MD.  1/6: Pt rates depression as high and denies SI/HI/AVH at this time.   3. Goal(s): Patient will demonstrate decreased signs of withdrawal due to substance abuse  Met:No.   Target date:at discharge   As evidenced by: Patient will produce a CIWA/COWS score of 0, have stable vitals signs, and no symptoms  of withdrawal.  1/6: Pt reports moderate withdrawals with high BP and CIWA of 2.   Attendees: Patient:   04/25/2015 8:55 AM   Family:   04/25/2015 8:55 AM   Physician:  Dr. Carlton Adam, MD 04/25/2015 8:55 AM   Nursing:   Roanna Banning RN 04/25/2015 8:55 AM   Clinical Social Worker: Maxie Better, LCSW 04/25/2015 8:55 AM   Clinical Social Worker: Peri Maris LCSWA 04/25/2015 8:55 AM   Other:  Gerline Legacy Nurse Case Manager 04/25/2015 8:55 AM   Other:  04/25/2015 8:55 AM   Other:   04/25/2015 8:55 AM   Other:  04/25/2015 8:55 AM   Other:  04/25/2015 8:55 AM   Other:  04/25/2015 8:55 AM    04/25/2015 8:55 AM    04/25/2015 8:55 AM    04/25/2015 8:55 AM    04/25/2015 8:55 AM    Scribe for Treatment Team:   Maxie Better, LCSW 04/25/2015 8:55 AM

## 2015-04-25 NOTE — Progress Notes (Signed)
[facility] referral made pert pt request.  Maxie Better, MSW, LCSW Clinical Social Worker 04/25/2015 2:27 PM

## 2015-04-26 DIAGNOSIS — F1320 Sedative, hypnotic or anxiolytic dependence, uncomplicated: Secondary | ICD-10-CM

## 2015-04-26 MED ORDER — NICOTINE 21 MG/24HR TD PT24
21.0000 mg | MEDICATED_PATCH | Freq: Every day | TRANSDERMAL | Status: DC
  Administered 2015-04-27 – 2015-04-29 (×3): 21 mg via TRANSDERMAL
  Filled 2015-04-26: qty 14
  Filled 2015-04-26 (×4): qty 1

## 2015-04-26 MED ORDER — CYCLOBENZAPRINE HCL 10 MG PO TABS
10.0000 mg | ORAL_TABLET | Freq: Two times a day (BID) | ORAL | Status: DC
  Administered 2015-04-26 – 2015-04-27 (×2): 10 mg via ORAL
  Filled 2015-04-26 (×7): qty 1

## 2015-04-26 MED ORDER — OLANZAPINE 5 MG PO TABS
5.0000 mg | ORAL_TABLET | Freq: Every day | ORAL | Status: DC
  Administered 2015-04-26 – 2015-04-27 (×2): 5 mg via ORAL
  Filled 2015-04-26: qty 2
  Filled 2015-04-26 (×2): qty 1
  Filled 2015-04-26: qty 2
  Filled 2015-04-26 (×2): qty 1

## 2015-04-26 MED ORDER — TRAZODONE HCL 50 MG PO TABS
50.0000 mg | ORAL_TABLET | Freq: Every evening | ORAL | Status: DC | PRN
  Administered 2015-04-26 (×2): 50 mg via ORAL
  Filled 2015-04-26 (×8): qty 1

## 2015-04-26 MED ORDER — CYCLOBENZAPRINE HCL 10 MG PO TABS
10.0000 mg | ORAL_TABLET | Freq: Once | ORAL | Status: AC
Start: 2015-04-26 — End: 2015-04-26
  Administered 2015-04-26: 10 mg via ORAL

## 2015-04-26 NOTE — Progress Notes (Signed)
Patient ID: Larry French, male   DOB: [birth year], 55 y.o.   MRN: [id number]   Pt currently presents with an anxious affect and anxious behavior. Pt frequently asks for pain medications for lower back pain, states "another pt told me she has a patch for her pain, that's what I need." Upon assessment pt states his pain is due to "stress and tension." Per self inventory, pt rates depression at a 5, hopelessness 5 and anxiety 10. Pt's daily goal is to "resting/sleep/make back feel better" and they intend to do so by "go to bed for a while this morn." Pt reports poor sleep, a fair appetite, low energy and good concentration.   Pt provided with prn and scheduled medications per providers orders. Pt's labs and vitals were monitored throughout the day. Pt supported emotionally and encouraged to express concerns and questions. Pt educated on medications and alternative pain relief techniques. Pt reports slight decrease in pain when heat applied.   Pt's safety ensured with 15 minute and environmental checks. Pt currently denies SI/HI and A/V hallucinations. Pt verbally agrees to seek staff if SI/HI or A/VH occurs and to consult with staff before acting on these thoughts. Will continue POC.

## 2015-04-26 NOTE — Plan of Care (Signed)
Problem: Alteration in mood & ability to function due to Goal: STG-Patient will attend groups Outcome: Progressing Pt attended evening wrap group

## 2015-04-26 NOTE — Progress Notes (Signed)
Patient ID: Larry French, male   DOB: 05-18-1960, 55 y.o.   MRN: [id number] D: Patient alert and cooperative. Pt reports anxiety over medication change.  Pt mood/affect appears anxious. Pt denies SI/HI/AVH and pain. Pt observed interacting well with peers. No acute physical distress noted.  A: Medications administered as prescribed. Emotional support given and will continue to monitor pt's progress for stabilization. R: Patient remains safe and complaint with medications. Pt attended evening wrap up group.

## 2015-04-26 NOTE — BHH Group Notes (Signed)
Adult Therapy Group Note  Date:  04/26/2015 Time:  10:00-11:00AM  Group Topic/Focus:   Stages of Change:   The focus of this group is to explain the stages of change and help patients identify changes they want to make upon discharge. Managing Feelings:   We also worked to identify what feelings were present during an anger outburst and develop a plan to handle anger and associated fear in a healthier way upon discharge.  Participation Level:  Active  Participation Quality:  Attentive, Sharing and Supportive  Affect:  Blunted  Cognitive:  Appropriate  Insight: Good  Engagement in Group:  Engaged  Modes of Intervention:  Education and Motivational Interviewing  Additional Comments:  Gerald Stabs displayed considerable growth of insight as we worked through an Music therapist exercise.  Lysle Dingwall 04/26/2015, 4:24 PM

## 2015-04-26 NOTE — BHH Group Notes (Signed)
[place] Group Notes:  (Nursing/MHT/Case Management/Adjunct)  Date:  04/26/2015  Time:  3:02 PM  Type of Therapy:  Psychoeducational Skills  Participation Level:  Active  Participation Quality:  Appropriate  Affect:  Appropriate  Cognitive:  Appropriate  Insight:  Appropriate  Engagement in Group:  Engaged  Modes of Intervention:  Discussion  Summary of Progress/Problems: Pt did attend self inventory group.  Benancio Deeds Shanta 04/26/2015, 3:02 PM

## 2015-04-26 NOTE — Progress Notes (Signed)
Adult Psychoeducational Group Note  Date:  04/26/2015 Time:  9:53 PM  Group Topic/Focus:  Wrap-Up Group:   The focus of this group is to help patients review their daily goal of treatment and discuss progress on daily workbooks.  Participation Level:  Active  Participation Quality:  Appropriate and Attentive  Affect:  Appropriate  Cognitive:  Alert, Appropriate and Oriented  Insight: Appropriate  Engagement in Group:  Engaged  Modes of Intervention:  Discussion and Education  Additional Comments:  Pt attended and participated in group.  Pt stated his goal today was to step outside of himself and try some new things.  Pt reported that he did this by playing piano for some of his peers and helping his roommate remember things around the unit.  Pt rated his day a 10/10.   Milus Glazier 04/26/2015, 9:53 PM

## 2015-04-26 NOTE — Progress Notes (Signed)
Patient ID: Larry French, male   DOB: March 14, 1961, 55 y.o.   MRN: [id number]     [facility] MD Progress Note  04/26/2015 4:50 PM Larry French  MRN:  [id number]   Subjective: Patient reports ' I am having lower back pain 8/10 that comes and goes"  Objective: Larry French is awake, alert and oriented X4 , found resting in bedroom.  Denies suicidal or homicidal ideation. Denies auditory or visual hallucination and does not appear to be responding to internal stimuli. Patient interacts well with staff and others. Patient reports he thinks some of his medication is not working and reports that he was been complaint without mediation side effects. States his depression 5/10. Stats he has been noticing lower back pain/aches that comes and goes. Patient reports his anxiety medication is not helping anymore beacuse I can still feel my heart beating'.  Reports his anxiety is 10/10 all the time.  Patient reports a good apetite. and resting on and off.  Support, encouragement and reassurance was provided.   Principal Problem: Benzodiazepine dependence ([place]) Diagnosis:   Patient Active Problem List   Diagnosis Date Noted  . 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 Alcohol abuse with alcohol-induced mood disorder ([place]) [F10.14] 04/22/2015  . GAD (generalized anxiety disorder) [F41.1]   . Severe episode of recurrent major depressive disorder, without psychotic features ([place]) [F33.2]   . Alcohol abuse [F10.10] 04/17/2015  . Benzodiazepine abuse [F13.10] 04/17/2015  . Posttraumatic stress disorder [F43.10] 04/17/2015  . Suicidal ideation [R45.851] 04/17/2015  . Abnormal EKG [R94.31]   . Benzodiazepine withdrawal ([place]) [F13.239]   . NSTEMI (non-ST elevated myocardial infarction) ([place]) [I21.4]   . Positive cardiac stress test [R94.39]   . Elevated troponin [R79.89]   . Withdrawal from benzodiazepine ([place]) [F13.239] 04/05/2015  . STEMI, RCA DES 05/28/10 [I21.3] 06/01/2011  . CAD,  residual CFX LAD disease [I25.10] 06/01/2011  . Tobacco abuse [Z72.0] 05/29/2011  . History of ETOH abuse and Xanax abuse. In rehab pta [F10.10] 05/29/2011   Total Time spent with patient: 45 minutes  Past Psychiatric History:  Anxiety disorder. MDD  Past Medical History:  Past Medical History  Diagnosis Date  . Alcohol abuse     Recovered x 15 months  . CA - skin cancer   . Mental disorder   . Headache(784.0)   . Anxiety   . Depression   . CAD, residual CFX LAD disease 06/01/2011    a. s/p inferior ST elevation MI s/p PCI/DES to RCA on 05/29/2011; b. residual LAD and LCx CAD tx'd on 06/02/11 cath with LAD CAD successfully tx'd w/ PCI/DES, LCx managaged medically   . Hypertension   . Hyperlipemia   . PTSD (post-traumatic stress disorder)   . Polysubstance abuse     a. etoh, xanax, tobacco  . MI (myocardial infarction) [facility])     Past Surgical History  Procedure Laterality Date  . Tonsillectomy and adenoidectomy    . Left heart cath Right 05/29/2011    Procedure: LEFT HEART CATH;  Surgeon: Leonie Man, MD;  Location: [facility] CATH LAB;  Service: Cardiovascular;  Laterality: Right;  . Left heart catheterization with coronary angiogram [name] 06/01/2011    Procedure: LEFT HEART CATHETERIZATION WITH CORONARY ANGIOGRAM;  Surgeon: Leonie Man, MD;  Location: [facility] CATH LAB;  Service: Cardiovascular;  Laterality: [name];  relook cath, possible PCI  . Cardiac catheterization Bilateral 04/09/2015    Procedure: Coronary Angiogram;  Surgeon: Wellington Hampshire, MD;  Location: [place]  CV LAB;  Service: Cardiovascular;  Laterality: Bilateral;  . Cardiac catheterization [name] 04/09/2015    Procedure: Coronary Stent Intervention;  Surgeon: Wellington Hampshire, MD;  Location: [place] CV LAB;  Service: Cardiovascular;  Laterality: [name];   Family History:  Family History  Problem Relation Age of Onset  . Coronary artery disease Maternal Grandfather 53    Died   Family Psychiatric  History:   denies Social History:  History  Alcohol Use  . Yes    Comment: this week     History  Drug Use No    Social History   Social History  . Marital Status: Single    Spouse Name: [name]  . Number of Children: 0  . Years of Education: [name]   Occupational History  . Student    Social History Main Topics  . Smoking status: Current Every Day Smoker -- 1.50 packs/day for 30 years    Types: Cigarettes  . Smokeless tobacco: None  . Alcohol Use: Yes     Comment: this week  . Drug Use: No  . Sexual Activity: No   Other Topics Concern  . None   Social History Narrative   Additional Social History:    Pain Medications: see PTA Prescriptions: see PTA Over the Counter: see PTA History of alcohol / drug use?: Yes Longest period of sobriety (when/how long): 4-5 years Negative Consequences of Use: Personal relationships, Financial Withdrawal Symptoms: Agitation, Tremors, Irritability, Sweats, Diarrhea, Nausea / Vomiting Name of Substance 1: ETOH 1 - Age of First Use: 15 1 - Amount (size/oz): 2-3 bottles of wine x the last 3 days 1 - Frequency: daily 1 - Duration: 3 continous days 1 - Last Use / Amount: am 04/22/15      Sleep: Fair  Appetite:  Good  Current Medications: Current Facility-Administered Medications  Medication Dose Route Frequency Provider Last Rate Last Dose  . acetaminophen (TYLENOL) tablet 650 mg  650 mg Oral Q6H PRN Laverle Hobby, PA-C   650 mg at 04/26/15 1621  . alum & mag hydroxide-simeth (MAALOX/MYLANTA) 200-200-20 MG/5ML suspension 30 mL  30 mL Oral Q4H PRN Laverle Hobby, PA-C      . aspirin EC tablet 81 mg  81 mg Oral Daily Laverle Hobby, PA-C   81 mg at 04/26/15 [id number]  . chlordiazePOXIDE (LIBRIUM) capsule 25 mg  25 mg Oral Q6H PRN Nicholaus Bloom, MD      . chlordiazePOXIDE (LIBRIUM) capsule 25 mg  25 mg Oral TID Nicholaus Bloom, MD   25 mg at 04/26/15 1136   Followed by  . [START ON 04/27/2015] chlordiazePOXIDE (LIBRIUM) capsule 25 mg  25 mg Oral  BH-qamhs Nicholaus Bloom, MD       Followed by  . [START ON 04/28/2015] chlordiazePOXIDE (LIBRIUM) capsule 25 mg  25 mg Oral Daily Nicholaus Bloom, MD      . clopidogrel (PLAVIX) tablet 75 mg  75 mg Oral Daily Laverle Hobby, PA-C   75 mg at 04/26/15 0813  . cyclobenzaprine (FLEXERIL) tablet 10 mg  10 mg Oral BID Derrill Center, NP      . feeding supplement (ENSURE ENLIVE) (ENSURE ENLIVE) liquid 237 mL  237 mL Oral BID BM Maricela Bo [place], RD   237 mL at 04/25/15 1616  . gabapentin (NEURONTIN) capsule 300 mg  300 mg Oral TID Nicholaus Bloom, MD   300 mg at 04/26/15 1136  . hydrOXYzine (ATARAX/VISTARIL) tablet 25 mg  25  mg Oral Q6H PRN Nicholaus Bloom, MD   25 mg at 04/26/15 1447  . losartan (COZAAR) tablet 25 mg  25 mg Oral Daily Laverle Hobby, PA-C   25 mg at 04/26/15 [id number]  . magnesium hydroxide (MILK OF MAGNESIA) suspension 30 mL  30 mL Oral Daily PRN Laverle Hobby, PA-C      . metoprolol tartrate (LOPRESSOR) tablet 25 mg  25 mg Oral BID Laverle Hobby, PA-C   25 mg at 04/26/15 0813  . multivitamin with minerals tablet 1 tablet  1 tablet Oral Daily Nicholaus Bloom, MD   1 tablet at 04/26/15 0813  . [START ON 04/27/2015] nicotine (NICODERM CQ - dosed in mg/24 hours) patch 21 mg  21 mg Transdermal Daily Nicholaus Bloom, MD      . nitroGLYCERIN (NITROSTAT) SL tablet 0.4 mg  0.4 mg Sublingual Q5 min PRN Laverle Hobby, PA-C      . ondansetron (ZOFRAN-ODT) disintegrating tablet 4 mg  4 mg Oral Q6H PRN Nicholaus Bloom, MD      . QUEtiapine (SEROQUEL) tablet 200 mg  200 mg Oral QHS Laverle Hobby, PA-C   200 mg at 04/25/15 2151  . simvastatin (ZOCOR) tablet 40 mg  40 mg Oral q morning - 10a Laverle Hobby, PA-C   40 mg at 04/26/15 1110  . thiamine (B-1) injection 100 mg  100 mg Intramuscular Once Laverle Hobby, PA-C   100 mg at 04/22/15 2237  . thiamine (VITAMIN B-1) tablet 100 mg  100 mg Oral Daily Nicholaus Bloom, MD   100 mg at 04/26/15 0813    Lab Results:  No results found for this or any previous  visit (from the past 35 hour(s)).  Physical Findings: AIMS: Facial and Oral Movements Muscles of Facial Expression: None, normal Lips and Perioral Area: None, normal Jaw: None, normal Tongue: None, normal,Extremity Movements Upper (arms, wrists, hands, fingers): None, normal Lower (legs, knees, ankles, toes): None, normal, Trunk Movements Neck, shoulders, hips: None, normal, Overall Severity Severity of abnormal movements (highest score from questions above): None, normal Incapacitation due to abnormal movements: None, normal Patient's awareness of abnormal movements (rate only patient's report): No Awareness, Dental Status Current problems with teeth and/or dentures?: No Does patient usually wear dentures?: Yes  CIWA:  CIWA-Ar Total: 1 COWS:  COWS Total Score: 2  Musculoskeletal: Strength & Muscle Tone: within normal limits Gait & Station: normal Patient leans: [name]  Psychiatric Specialty Exam: Review of Systems  Musculoskeletal: Positive for back pain.  Psychiatric/Behavioral: Positive for depression. Negative for suicidal ideas and hallucinations. The patient is nervous/anxious and has insomnia.   All other systems reviewed and are negative.   Blood pressure 123/84, pulse 67, temperature 98 F (36.7 C), temperature source Oral, resp. rate 18, height 5\' 8"  (1.727 m), weight 83.915 kg (185 lb), SpO2 97 %.Body mass index is 28.14 kg/(m^2).   General Appearance: pleasant, clam and cooperative  Eye Contact:: Good  Speech: clear, coherent   Volume: Normal  Mood: Depressed  Affect: Congruent  Thought Process: Circumstantial  Orientation: Full (Time, Place, and Person)  Thought Content: Negative  Suicidal Thoughts: Yes. without intent/plan  Homicidal Thoughts: No  Memory: Immediate; Good  Judgement: Fair  Insight: Fair  Psychomotor Activity: Normal  Concentration: Fair  Recall: [place] of Knowledge:Fair  Language: Good  Akathisia:  No  Handed: Right  AIMS (if indicated):    Assets: Desire for Improvement  ADL's: Intact  Cognition:  WNL  Sleep: Number of Hours: 6           I agree with current treatment plan on 04/25/2014, Patient seen face-to-face for psychiatric evaluation follow-up, chart reviewed  And discussed with MD Jonnagadda. Reviewed the information documented and agree with the treatment plan.  Treatment Plan Summary: Daily contact with patient to assess and evaluate symptoms and progress in treatment and Medication management Supportive approach/coping skills Alcohol/Benzodiazepine Dependence; will use the Librium detox protocol/ work a relapse prevention plan Start Flexeril 10 mg Po BID for lower back pain/aches Discontinue Seroquel 200 mg PO QHS - not helpful and Start Zyprex 5mg  for mood swings and insomnia Change Trazodone 50 mg PO QHS with repeat dose    Anxiety; will add Neurontin 300 mg TID that might help the detox as well as the anxiety Depression; will reassess for the use of an antidepressant Work with CBT/mindfulness Explore residential treatment options  [place] 04/26/2015, 4:50 PM  Case discussed with physician extender and recommend medication changes. Reviewed the information documented and agree with the treatment plan.  Ankita Newcomer,JANARDHAHA R. 04/26/2015 6:29 PM

## 2015-04-27 MED ORDER — TRAZODONE HCL 100 MG PO TABS
100.0000 mg | ORAL_TABLET | Freq: Every evening | ORAL | Status: DC | PRN
  Administered 2015-04-27 (×2): 100 mg via ORAL
  Filled 2015-04-27 (×8): qty 1

## 2015-04-27 MED ORDER — CYCLOBENZAPRINE HCL 5 MG PO TABS
5.0000 mg | ORAL_TABLET | Freq: Two times a day (BID) | ORAL | Status: DC
  Administered 2015-04-27 – 2015-04-28 (×2): 5 mg via ORAL
  Filled 2015-04-27 (×6): qty 1

## 2015-04-27 MED ORDER — POTASSIUM CHLORIDE CRYS ER 20 MEQ PO TBCR
20.0000 meq | EXTENDED_RELEASE_TABLET | Freq: Once | ORAL | Status: AC
Start: 2015-04-27 — End: 2015-04-27
  Administered 2015-04-27: 20 meq via ORAL
  Filled 2015-04-27 (×2): qty 1

## 2015-04-27 NOTE — Progress Notes (Signed)
Patient ID: Larry French, male   DOB: Feb 22, 1961, 55 y.o.   MRN: [id number]   Pt currently presents with an anxious affect and behavior. Per self inventory, pt rates depression at a 5, hopelessness 5 and anxiety 8. Pt's daily goal is to "helping others/good deeds/resting" and they intend to do so by "see above." Pt reports poor sleep to writer, says change in meds did not help. Pt reports increased irritability due to lack of sleep. Pt also reports a good appetite, low energy and good concentration. Pt encourages others on the unit, pt lacking insight into personal drug abuse.   Pt provided with medications per providers orders. Pt's labs and vitals were monitored throughout the day. Pt supported emotionally and encouraged to express concerns and questions. Pt educated on medications.  Pt's safety ensured with 15 minute and environmental checks. Pt currently denies SI/HI and A/V hallucinations. Pt verbally agrees to seek staff if SI/HI or A/VH occurs and to consult with staff before acting on these thoughts. Pt continues to focus on other pts recovery, makes jokes at inappropriate times. Will continue POC.

## 2015-04-27 NOTE — Progress Notes (Signed)
[facility] MD Progress Note  04/27/2015 3:45 PM Larry French  MRN:  [id number]   Subjective: Patient reports '' My back pain is improving and I still unable to sleep."  Objective: Larry French reports some of his medication is not working and he was been complaint without mediation side effects. States his depression 5/10, and anxiety is 5/10 all the time. Stats he has been noticing lower back pain/aches that comes and goes. States that mediations did help is back pain/ spasm.  Patient reports a good appetite. and resting on and off and the medication is not helpful.  Support, encouragement and reassurance was provided. He is awake, alert and oriented X4 , found resting in bedroom.  He has no suicidal or homicidal ideation,  auditory or visual hallucination and does not appear to be responding to internal stimuli. Patient interacts well with staff and others.  Principal Problem: Benzodiazepine dependence ([place]) Diagnosis:   Patient Active Problem List   Diagnosis Date Noted  . Benzodiazepine dependence ([place]) [F13.20] 04/25/2015  . Alcohol abuse with alcohol-induced mood disorder ([place]) [F10.14] 04/22/2015  . GAD (generalized anxiety disorder) [F41.1]   . Severe episode of recurrent major depressive disorder, without psychotic features ([place]) [F33.2]   . Alcohol abuse [F10.10] 04/17/2015  . Benzodiazepine abuse [F13.10] 04/17/2015  . Posttraumatic stress disorder [F43.10] 04/17/2015  . Suicidal ideation [R45.851] 04/17/2015  . Abnormal EKG [R94.31]   . Benzodiazepine withdrawal ([place]) [F13.239]   . NSTEMI (non-ST elevated myocardial infarction) ([place]) [I21.4]   . Positive cardiac stress test [R94.39]   . Elevated troponin [R79.89]   . Withdrawal from benzodiazepine ([place]) [F13.239] 04/05/2015  . STEMI, RCA DES 05/28/10 [I21.3] 06/01/2011  . CAD, residual CFX LAD disease [I25.10] 06/01/2011  . Tobacco abuse [Z72.0] 05/29/2011  . History of ETOH abuse and Xanax abuse. In rehab pta  [F10.10] 05/29/2011   Total Time spent with patient: 30 minutes  Past Psychiatric History:  Anxiety disorder. MDD  Past Medical History:  Past Medical History  Diagnosis Date  . Alcohol abuse     Recovered x 15 months  . CA - skin cancer   . Mental disorder   . Headache(784.0)   . Anxiety   . Depression   . CAD, residual CFX LAD disease 06/01/2011    a. s/p inferior ST elevation MI s/p PCI/DES to RCA on 05/29/2011; b. residual LAD and LCx CAD tx'd on 06/02/11 cath with LAD CAD successfully tx'd w/ PCI/DES, LCx managaged medically   . Hypertension   . Hyperlipemia   . PTSD (post-traumatic stress disorder)   . Polysubstance abuse     a. etoh, xanax, tobacco  . MI (myocardial infarction) [facility])     Past Surgical History  Procedure Laterality Date  . Tonsillectomy and adenoidectomy    . Left heart cath Right 05/29/2011    Procedure: LEFT HEART CATH;  Surgeon: Leonie Man, MD;  Location: [facility] CATH LAB;  Service: Cardiovascular;  Laterality: Right;  . Left heart catheterization with coronary angiogram [name] 06/01/2011    Procedure: LEFT HEART CATHETERIZATION WITH CORONARY ANGIOGRAM;  Surgeon: Leonie Man, MD;  Location: [facility] CATH LAB;  Service: Cardiovascular;  Laterality: [name];  relook cath, possible PCI  . Cardiac catheterization Bilateral 04/09/2015    Procedure: Coronary Angiogram;  Surgeon: Wellington Hampshire, MD;  Location: [place] CV LAB;  Service: Cardiovascular;  Laterality: Bilateral;  . Cardiac catheterization [name] 04/09/2015    Procedure: Coronary Stent Intervention;  Surgeon: Wellington Hampshire, MD;  Location: [place] CV LAB;  Service: Cardiovascular;  Laterality: [name];   Family History:  Family History  Problem Relation Age of Onset  . Coronary artery disease Maternal Grandfather 63    Died   Family Psychiatric  History:  denies Social History:  History  Alcohol Use  . Yes    Comment: this week     History  Drug Use No    Social History   Social History   . Marital Status: Single    Spouse Name: [name]  . Number of Children: 0  . Years of Education: [name]   Occupational History  . Student    Social History Main Topics  . Smoking status: Current Every Day Smoker -- 1.50 packs/day for 30 years    Types: Cigarettes  . Smokeless tobacco: None  . Alcohol Use: Yes     Comment: this week  . Drug Use: No  . Sexual Activity: No   Other Topics Concern  . None   Social History Narrative   Additional Social History:    Pain Medications: see PTA Prescriptions: see PTA Over the Counter: see PTA History of alcohol / drug use?: Yes Longest period of sobriety (when/how long): 4-5 years Negative Consequences of Use: Personal relationships, Financial Withdrawal Symptoms: Agitation, Tremors, Irritability, Sweats, Diarrhea, Nausea / Vomiting Name of Substance 1: ETOH 1 - Age of First Use: 15 1 - Amount (size/oz): 2-3 bottles of wine x the last 3 days 1 - Frequency: daily 1 - Duration: 3 continous days 1 - Last Use / Amount: am 04/22/15      Sleep: Poor  Appetite:  Good  Current Medications: Current Facility-Administered Medications  Medication Dose Route Frequency Provider Last Rate Last Dose  . acetaminophen (TYLENOL) tablet 650 mg  650 mg Oral Q6H PRN Laverle Hobby, PA-C   650 mg at 04/27/15 0636  . alum & mag hydroxide-simeth (MAALOX/MYLANTA) 200-200-20 MG/5ML suspension 30 mL  30 mL Oral Q4H PRN Laverle Hobby, PA-C      . aspirin EC tablet 81 mg  81 mg Oral Daily Laverle Hobby, PA-C   81 mg at 04/27/15 0806  . chlordiazePOXIDE (LIBRIUM) capsule 25 mg  25 mg Oral Q6H PRN Nicholaus Bloom, MD      . chlordiazePOXIDE (LIBRIUM) capsule 25 mg  25 mg Oral BH-qamhs Nicholaus Bloom, MD   25 mg at 04/27/15 [id number]   Followed by  . [START ON 04/28/2015] chlordiazePOXIDE (LIBRIUM) capsule 25 mg  25 mg Oral Daily Nicholaus Bloom, MD      . clopidogrel (PLAVIX) tablet 75 mg  75 mg Oral Daily Laverle Hobby, PA-C   75 mg at 04/27/15 0806  .  cyclobenzaprine (FLEXERIL) tablet 10 mg  10 mg Oral BID Derrill Center, NP   10 mg at 04/27/15 1000  . feeding supplement (ENSURE ENLIVE) (ENSURE ENLIVE) liquid 237 mL  237 mL Oral BID BM Maricela Bo [place], RD   237 mL at 04/25/15 1616  . gabapentin (NEURONTIN) capsule 300 mg  300 mg Oral TID Nicholaus Bloom, MD   300 mg at 04/27/15 1159  . hydrOXYzine (ATARAX/VISTARIL) tablet 25 mg  25 mg Oral Q6H PRN Nicholaus Bloom, MD   25 mg at 04/27/15 1523  . losartan (COZAAR) tablet 25 mg  25 mg Oral Daily Laverle Hobby, PA-C   25 mg at 04/27/15 [id number]  . magnesium hydroxide (MILK OF MAGNESIA) suspension 30 mL  30 mL Oral  Daily PRN Laverle Hobby, PA-C      . metoprolol tartrate (LOPRESSOR) tablet 25 mg  25 mg Oral BID Laverle Hobby, PA-C   25 mg at 04/27/15 [id number]  . multivitamin with minerals tablet 1 tablet  1 tablet Oral Daily Nicholaus Bloom, MD   1 tablet at 04/27/15 [phone]  . nicotine (NICODERM CQ - dosed in mg/24 hours) patch 21 mg  21 mg Transdermal Daily Nicholaus Bloom, MD   21 mg at 04/27/15 0805  . nitroGLYCERIN (NITROSTAT) SL tablet 0.4 mg  0.4 mg Sublingual Q5 min PRN Laverle Hobby, PA-C      . OLANZapine (ZYPREXA) tablet 5 mg  5 mg Oral QHS Derrill Center, NP   5 mg at 04/26/15 2204  . ondansetron (ZOFRAN-ODT) disintegrating tablet 4 mg  4 mg Oral Q6H PRN Nicholaus Bloom, MD      . simvastatin (ZOCOR) tablet 40 mg  40 mg Oral q morning - 10a Laverle Hobby, PA-C   40 mg at 04/27/15 1000  . thiamine (B-1) injection 100 mg  100 mg Intramuscular Once Laverle Hobby, PA-C   100 mg at 04/22/15 2237  . thiamine (VITAMIN B-1) tablet 100 mg  100 mg Oral Daily Nicholaus Bloom, MD   100 mg at 04/27/15 0807  . traZODone (DESYREL) tablet 50 mg  50 mg Oral QHS,MR X 1 Derrill Center, NP   50 mg at 04/26/15 2204    Lab Results:  No results found for this or any previous visit (from the past 48 hour(s)).  Physical Findings: AIMS: Facial and Oral Movements Muscles of Facial Expression: None, normal Lips and  Perioral Area: None, normal Jaw: None, normal Tongue: None, normal,Extremity Movements Upper (arms, wrists, hands, fingers): None, normal Lower (legs, knees, ankles, toes): None, normal, Trunk Movements Neck, shoulders, hips: None, normal, Overall Severity Severity of abnormal movements (highest score from questions above): None, normal Incapacitation due to abnormal movements: None, normal Patient's awareness of abnormal movements (rate only patient's report): No Awareness, Dental Status Current problems with teeth and/or dentures?: No Does patient usually wear dentures?: Yes  CIWA:  CIWA-Ar Total: 4 COWS:  COWS Total Score: 2  Musculoskeletal: Strength & Muscle Tone: within normal limits Gait & Station: normal Patient leans: [name]  Psychiatric Specialty Exam: Review of Systems  Gastrointestinal: Positive for heartburn.  Musculoskeletal: Positive for back pain.  Psychiatric/Behavioral: Positive for depression. Negative for suicidal ideas and hallucinations. The patient is nervous/anxious and has insomnia.   All other systems reviewed and are negative.   Blood pressure 126/79, pulse 70, temperature 97.4 F (36.3 C), temperature source Oral, resp. rate 16, height 5\' 8"  (1.727 m), weight 83.915 kg (185 lb), SpO2 97 %.Body mass index is 28.14 kg/(m^2).   General Appearance: pleasant, clam and cooperative  Eye Contact:: Good  Speech: clear, coherent   Volume: Normal  Mood: Depressed  Affect: Congruent  Thought Process: Circumstantial  Orientation: Full (Time, Place, and Person)  Thought Content: Negative  Suicidal Thoughts: No, denies ideations at this time  Homicidal Thoughts: No  Memory: Immediate; Good  Judgement: Fair  Insight: Fair  Psychomotor Activity: Normal  Concentration: Fair  Recall: [place] of Knowledge:Fair  Language: Good  Akathisia: No  Handed: Right  AIMS (if indicated):    Assets: Desire for Improvement   ADL's: Intact  Cognition: WNL  Sleep: Number of Hours: 6           I agree  with current treatment plan on 04/27/2015, Patient seen face-to-face for psychiatric evaluation follow-up, chart reviewed  And discussed with MD Jonnagadda. Reviewed the information documented and agree with the treatment plan.  Treatment Plan Summary: Daily contact with patient to assess and evaluate symptoms and progress in treatment and Medication management Supportive approach/coping skills Alcohol/Benzodiazepine Dependence; will use the Librium detox protocol/ work a relapse prevention plan  Continue Flexeril 10 mg Po BID for lower back pain/aches Discontinue Seroquel 200 mg PO QHS - not helpful and Start Zyprex 5mg  for mood swings and insomnia Change Trazodone 100 mg PO QHS for insomnia    Start K-Dur, KLOR  20 MEQ from low Potassium ( 3.4)L- Lab collection 04/28/2015 for Potassium level. Anxiety; will add Neurontin 300 mg TID that might help the detox as well as the anxiety  Depression; will reassess for the use of an antidepressant Work with CBT/mindfulness Explore residential treatment options  [place] 04/27/2015, 3:45 PM

## 2015-04-27 NOTE — Progress Notes (Signed)
Patient ID: KINTA ASTER, male   DOB: [birth year], 55 y.o.   MRN: [id number]  Adult Psychoeducational Group Note  Date:  04/27/2015 Time:  1:15pm  Group Topic/Focus:  Healthy Support Systems  Participation Level:  Active  Participation Quality:  Intrusive, Monopolizing and Resistant  Affect:  Labile  Cognitive:  Alert and Oriented  Insight: Lacking  Engagement in Group:  Defensive, Distracting, Engaged, Off Topic and Resistant  Modes of Intervention:  Activity, Discussion, Education and Support  Additional Comments:  Pt able to identify the difference between unhealthy and healthy coping skills.   Elenore Rota 04/27/2015, 10:39 AM

## 2015-04-27 NOTE — [facility] Group Notes (Signed)
[place] Group Notes:  (Clinical Social Work)  04/27/2015  10:00-11:00AM  Summary of Progress/Problems:   The main focus of today's process group was to   1)  discuss the importance of adding supports  2)  define health supports versus unhealthy supports  3)  identify the patient's current unhealthy supports and plan how to handle them  4)  Identify the patient's current healthy supports and plan what to add, as well as how to add them.  An emphasis was placed on using counselor, doctor, therapy groups, 12-step groups, and problem-specific support groups to expand supports.    The patient expressed full comprehension of the concepts presented, and agreed that there is a need to add more supports.  The patient stated his nurses here in the hospital are his healthiest supports, while his mother and father are unhealthy supports.  Later as we discussed qualities of healthy versus unhealthy supports, he made the decision that his parents are indeed unhealthy for him in ways, but are also healthy in other ways.  Type of Therapy:  Process Group with Motivational Interviewing  Participation Level:  Active  Participation Quality:  Attentive, Sharing and Supportive  Affect:  Blunted, Depressed and Tearful  Cognitive:  Appropriate and Oriented  Insight:  Engaged  Engagement in Therapy:  Engaged  Modes of Intervention:   Education, Support and Processing, Activity  Selmer Dominion, LCSW 04/27/2015

## 2015-04-27 NOTE — Progress Notes (Signed)
D: Patient observed interacting with peers and staff. Patient states goal for the day was to " do something nice for someone." Patient mood a little brighter today, he is smiling more. Patient states he feels "more like my self today."  A: Support and encouragement offered. Q 15 minute checks in progress and maintained for safety. R: Patient remains safe on unit and monitoring continues.

## 2015-04-27 NOTE — Progress Notes (Signed)
D: Patient observed interacting with peers and staff. Patient states his goal was to " get medication for his back." Patient states "flexeril was ordered and he has received relief with medication." Patient in pleasant mood. Patient denies SI/HI and A/V  Hallucinations.  A: Support and encouragement offered. Q 15 minute checks maintained and in progress.  R: Patient remains safe on unit. Monitoring continues.

## 2015-04-27 NOTE — Progress Notes (Signed)
Adult Psychoeducational Group Note  Date:  04/27/2015 Time:  9:36 PM  Group Topic/Focus:  Wrap-Up Group:   The focus of this group is to help patients review their daily goal of treatment and discuss progress on daily workbooks.  Participation Level:  Active  Participation Quality:  Appropriate and Attentive  Affect:  Appropriate  Cognitive:  Appropriate  Insight: Appropriate  Engagement in Group:  Engaged  Modes of Intervention:  Discussion  Additional Comments:  Pt stated his goal for today was to start acting like the old Gerald Stabs and to find himself again. Pt stated one positive thing is he was able to help someone and someone else helped him today.  Clint Bolder 04/27/2015, 9:36 PM

## 2015-04-28 LAB — POTASSIUM: POTASSIUM: 4.6 mmol/L (ref 3.5–5.1)

## 2015-04-28 MED ORDER — HYDROXYZINE HCL 50 MG PO TABS
50.0000 mg | ORAL_TABLET | Freq: Four times a day (QID) | ORAL | Status: DC | PRN
  Administered 2015-04-28 – 2015-04-29 (×4): 50 mg via ORAL
  Filled 2015-04-28 (×3): qty 1
  Filled 2015-04-28: qty 10
  Filled 2015-04-28: qty 1

## 2015-04-28 MED ORDER — CYCLOBENZAPRINE HCL 10 MG PO TABS
10.0000 mg | ORAL_TABLET | Freq: Two times a day (BID) | ORAL | Status: DC
  Administered 2015-04-28 – 2015-04-29 (×2): 10 mg via ORAL
  Filled 2015-04-28 (×3): qty 1
  Filled 2015-04-28: qty 28
  Filled 2015-04-28: qty 1
  Filled 2015-04-28: qty 28
  Filled 2015-04-28 (×2): qty 1

## 2015-04-28 MED ORDER — QUETIAPINE FUMARATE 200 MG PO TABS
200.0000 mg | ORAL_TABLET | Freq: Every day | ORAL | Status: DC
  Administered 2015-04-28: 200 mg via ORAL
  Filled 2015-04-28 (×2): qty 1

## 2015-04-28 NOTE — Progress Notes (Signed)
[facility] MD Progress Note  04/28/2015 3:33 PM Larry French  MRN:  [id number] Subjective:  [name] continues to work on his anxiety. He continues to be detox from the benzodiazepines. He is going to be able to have a bed at [facility] in the next couple of days. States he for the longest time knew he needed help but he was not ready to do the work. States he is ready now. States he used to focus on the time he wasted and that kept him anxious upset what gave him an excuse to use. Now states he is focused on the time he has ahead of him and what he can do to have a rewarding productive life Principal Problem: Benzodiazepine dependence ([place]) Diagnosis:   Patient Active Problem List   Diagnosis Date Noted  . Benzodiazepine dependence ([place]) [F13.20] 04/25/2015  . Alcohol abuse with alcohol-induced mood disorder ([place]) [F10.14] 04/22/2015  . GAD (generalized anxiety disorder) [F41.1]   . Severe episode of recurrent major depressive disorder, without psychotic features ([place]) [F33.2]   . Alcohol abuse [F10.10] 04/17/2015  . Benzodiazepine abuse [F13.10] 04/17/2015  . Posttraumatic stress disorder [F43.10] 04/17/2015  . Suicidal ideation [R45.851] 04/17/2015  . Abnormal EKG [R94.31]   . Benzodiazepine withdrawal ([place]) [F13.239]   . NSTEMI (non-ST elevated myocardial infarction) ([place]) [I21.4]   . Positive cardiac stress test [R94.39]   . Elevated troponin [R79.89]   . Withdrawal from benzodiazepine ([place]) [F13.239] 04/05/2015  . STEMI, RCA DES 05/28/10 [I21.3] 06/01/2011  . CAD, residual CFX LAD disease [I25.10] 06/01/2011  . Tobacco abuse [Z72.0] 05/29/2011  . History of ETOH abuse and Xanax abuse. In rehab pta [F10.10] 05/29/2011   Total Time spent with patient: 20 minutes  Past Psychiatric History: see admission H and P  Past Medical History:  Past Medical History  Diagnosis Date  . Alcohol abuse     Recovered x 15 months  . CA - skin cancer   . Mental disorder   . Headache(784.0)   .  Anxiety   . Depression   . CAD, residual CFX LAD disease 06/01/2011    a. s/p inferior ST elevation MI s/p PCI/DES to RCA on 05/29/2011; b. residual LAD and LCx CAD tx'd on 06/02/11 cath with LAD CAD successfully tx'd w/ PCI/DES, LCx managaged medically   . Hypertension   . Hyperlipemia   . PTSD (post-traumatic stress disorder)   . Polysubstance abuse     a. etoh, xanax, tobacco  . MI (myocardial infarction) [facility])     Past Surgical History  Procedure Laterality Date  . Tonsillectomy and adenoidectomy    . Left heart cath Right 05/29/2011    Procedure: LEFT HEART CATH;  Surgeon: Leonie Man, MD;  Location: [facility] CATH LAB;  Service: Cardiovascular;  Laterality: Right;  . Left heart catheterization with coronary angiogram [name] 06/01/2011    Procedure: LEFT HEART CATHETERIZATION WITH CORONARY ANGIOGRAM;  Surgeon: Leonie Man, MD;  Location: [facility] CATH LAB;  Service: Cardiovascular;  Laterality: [name];  relook cath, possible PCI  . Cardiac catheterization Bilateral 04/09/2015    Procedure: Coronary Angiogram;  Surgeon: Wellington Hampshire, MD;  Location: [facility] CV LAB;  Service: Cardiovascular;  Laterality: Bilateral;  . Cardiac catheterization [name] 04/09/2015    Procedure: Coronary Stent Intervention;  Surgeon: Wellington Hampshire, MD;  Location: [facility] CV LAB;  Service: Cardiovascular;  Laterality: [name];   Family History:  Family History  Problem Relation Age of Onset  . Coronary artery disease Maternal  Grandfather 31    Died   Family Psychiatric  History: see admission H and P Social History:  History  Alcohol Use  . Yes    Comment: this week     History  Drug Use No    Social History   Social History  . Marital Status: Single    Spouse Name: [name]  . Number of Children: 0  . Years of Education: [name]   Occupational History  . Student    Social History Main Topics  . Smoking status: Current Every Day Smoker -- 1.50 packs/day for 30 years    Types: Cigarettes  . Smokeless  tobacco: None  . Alcohol Use: Yes     Comment: this week  . Drug Use: No  . Sexual Activity: No   Other Topics Concern  . None   Social History Narrative   Additional Social History:    Pain Medications: see PTA Prescriptions: see PTA Over the Counter: see PTA History of alcohol / drug use?: Yes Longest period of sobriety (when/how long): 4-5 years Negative Consequences of Use: Personal relationships, Financial Withdrawal Symptoms: Agitation, Tremors, Irritability, Sweats, Diarrhea, Nausea / Vomiting Name of Substance 1: ETOH 1 - Age of First Use: 15 1 - Amount (size/oz): 2-3 bottles of wine x the last 3 days 1 - Frequency: daily 1 - Duration: 3 continous days 1 - Last Use / Amount: am 04/22/15                  Sleep: Poor  Appetite:  Fair  Current Medications: Current Facility-Administered Medications  Medication Dose Route Frequency Provider Last Rate Last Dose  . acetaminophen (TYLENOL) tablet 650 mg  650 mg Oral Q6H PRN Laverle Hobby, PA-C   650 mg at 04/28/15 [id number]  . alum & mag hydroxide-simeth (MAALOX/MYLANTA) 200-200-20 MG/5ML suspension 30 mL  30 mL Oral Q4H PRN Laverle Hobby, PA-C   30 mL at 04/27/15 1741  . aspirin EC tablet 81 mg  81 mg Oral Daily Laverle Hobby, PA-C   81 mg at 04/28/15 0756  . clopidogrel (PLAVIX) tablet 75 mg  75 mg Oral Daily Laverle Hobby, PA-C   75 mg at 04/28/15 0756  . cyclobenzaprine (FLEXERIL) tablet 10 mg  10 mg Oral BID Nicholaus Bloom, MD      . feeding supplement (ENSURE ENLIVE) (ENSURE ENLIVE) liquid 237 mL  237 mL Oral BID BM Maricela Bo Ostheim, RD   237 mL at 04/25/15 1616  . gabapentin (NEURONTIN) capsule 300 mg  300 mg Oral TID Nicholaus Bloom, MD   300 mg at 04/28/15 1146  . hydrOXYzine (ATARAX/VISTARIL) tablet 50 mg  50 mg Oral Q6H PRN Nicholaus Bloom, MD   50 mg at 04/28/15 1446  . losartan (COZAAR) tablet 25 mg  25 mg Oral Daily Laverle Hobby, PA-C   25 mg at 04/28/15 0756  . magnesium hydroxide (MILK OF MAGNESIA)  suspension 30 mL  30 mL Oral Daily PRN Laverle Hobby, PA-C      . metoprolol tartrate (LOPRESSOR) tablet 25 mg  25 mg Oral BID Laverle Hobby, PA-C   25 mg at 04/28/15 0756  . multivitamin with minerals tablet 1 tablet  1 tablet Oral Daily Nicholaus Bloom, MD   1 tablet at 04/28/15 0756  . nicotine (NICODERM CQ - dosed in mg/24 hours) patch 21 mg  21 mg Transdermal Daily Nicholaus Bloom, MD   21 mg at 04/28/15  [id number]  . nitroGLYCERIN (NITROSTAT) SL tablet 0.4 mg  0.4 mg Sublingual Q5 min PRN Laverle Hobby, PA-C      . QUEtiapine (SEROQUEL) tablet 200 mg  200 mg Oral QHS Nicholaus Bloom, MD      . simvastatin (ZOCOR) tablet 40 mg  40 mg Oral q morning - 10a Laverle Hobby, PA-C   40 mg at 04/28/15 0941  . thiamine (B-1) injection 100 mg  100 mg Intramuscular Once Laverle Hobby, PA-C   100 mg at 04/22/15 2237  . thiamine (VITAMIN B-1) tablet 100 mg  100 mg Oral Daily Nicholaus Bloom, MD   100 mg at 04/28/15 [id number]    Lab Results:  Results for orders placed or performed during the hospital encounter of 04/22/15 (from the past 48 hour(s))  Potassium     Status: None   Collection Time: 04/28/15  6:45 AM  Result Value Ref Range   Potassium 4.6 3.5 - 5.1 mmol/L    Comment: Performed at [facility]    Physical Findings: AIMS: Facial and Oral Movements Muscles of Facial Expression: None, normal Lips and Perioral Area: None, normal Jaw: None, normal Tongue: None, normal,Extremity Movements Upper (arms, wrists, hands, fingers): None, normal Lower (legs, knees, ankles, toes): None, normal, Trunk Movements Neck, shoulders, hips: None, normal, Overall Severity Severity of abnormal movements (highest score from questions above): None, normal Incapacitation due to abnormal movements: None, normal Patient's awareness of abnormal movements (rate only patient's report): No Awareness, Dental Status Current problems with teeth and/or dentures?: No Does patient usually wear dentures?: Yes   CIWA:  CIWA-Ar Total: 1 COWS:  COWS Total Score: 2  Musculoskeletal: Strength & Muscle Tone: within normal limits Gait & Station: normal Patient leans: normal  Psychiatric Specialty Exam: Review of Systems  Constitutional: Negative.   HENT: Negative.   Eyes: Negative.   Respiratory: Negative.   Cardiovascular: Negative.   Gastrointestinal: Negative.   Genitourinary: Negative.   Musculoskeletal: Negative.   Skin: Negative.   Neurological: Negative.   Endo/Heme/Allergies: Negative.   Psychiatric/Behavioral: Positive for substance abuse. The patient is nervous/anxious.     Blood pressure 128/73, pulse 97, temperature 97.7 F (36.5 C), temperature source Oral, resp. rate 18, height 5\' 8"  (1.727 m), weight 83.915 kg (185 lb), SpO2 97 %.Body mass index is 28.14 kg/(m^2).  General Appearance: Fairly Groomed  Engineer, water::  Fair  Speech:  Clear and Coherent  Volume:  Normal  Mood:  Anxious and worried  Affect:  anxious worried  Thought Process:  Coherent and Goal Directed  Orientation:  Full (Time, Place, and Person)  Thought Content:  symptoms events worries concerns  Suicidal Thoughts:  No  Homicidal Thoughts:  No  Memory:  Immediate;   Fair Recent;   Fair Remote;   Fair  Judgement:  Fair  Insight:  Present and Shallow  Psychomotor Activity:  Restlessness  Concentration:  Fair  Recall:  [employer] of Knowledge:Fair  Language: Fair  Akathisia:  No  Handed:  Right  AIMS (if indicated):     Assets:  Desire for Improvement Housing  ADL's:  Intact  Cognition: WNL  Sleep:  Number of Hours: 5.75   Treatment Plan Summary: Daily contact with patient to assess and evaluate symptoms and progress in treatment and Medication management Supportive approach/coping skills Alcohol abuse/benzodiazepine dependence; continue to work a relapse prevention plan Anxiety; vistaril 50 gm Q 6 Hrs PRN anxiety Insomnia/ruminative thinking; will D/C the Zyprexa and resume  his Seroquel at  200 mg HS states this is the only medication that works for him Will D/C the Trazodone Will facilitate his admission to [facility] in the next couple of days Antoniette Peake A 04/28/2015, 3:33 PM

## 2015-04-28 NOTE — Progress Notes (Signed)
Recreation Therapy Notes  Date: 01.09.2017 Time: 9:30am Location: [address] Dayroom   Group Topic: Stress Management  Goal Area(s) Addresses:  Patient will actively participate in stress management techniques presented during session.   Behavioral Response: Did not attend.    Laureen Ochs Koleman Marling, LRT/CTRS        Lane Hacker 04/28/2015 2:59 PM

## 2015-04-28 NOTE — Progress Notes (Signed)
Patient ID: Larry French, male   DOB: [birth year], 55 y.o.   MRN: [id number]  Pt currently presents with a masked affect and anxious behavior. Per self inventory, pt rates depression, hopelessness and anxiety at an 8. Pt's daily goal is to "after care" and they intend to do so by "meet with csw." Pt reports poor sleep, a good appetite, low energy and poor concentration.   Pt provided with medications per providers orders. Pt's labs and vitals were monitored throughout the day. Pt supported emotionally and encouraged to express concerns and questions. Pt educated on medications.  Pt's safety ensured with 15 minute and environmental checks. Pt currently denies SI/HI and A/V hallucinations. Pt verbally agrees to seek staff if SI/HI or A/VH occurs and to consult with staff before acting on these thoughts. Pt continues to focus on the recovery of others on the unit, speaks with social worker about discharge plans this morning. Will continue POC.

## 2015-04-28 NOTE — Progress Notes (Signed)
Pt attended spiritual care group on grief and loss facilitated by counseling intern Curt Bears Payden Docter. Group opened with brief discussion and psycho-social ed around grief and loss in relationships and in relation to self - identifying life patterns, circumstances, changes that cause losses. Established group norm of speaking from own life experience. Group goal of establishing open and affirming space for members to share loss and experience with grief, normalize grief experience and provide psycho social education and grief support. Group members processed their own feelings of grief and loss with the group. Group was closed with a reminder that counseling intern is available to process further on on one and provided group members with a list of other grief support groups.  Pt was alert and oriented x4 with appropriate affect and mood. Pt was tearful at times throughout group and was an active participant. Pt often encouraged others and found meaning in their experiences with grief. On several occasions pt would agree with a group member on how they experienced their grief but when asked if he would like to share his own he stated he did not know if he was able and did not feel that he could share at that time. Pt reports feelings of grief and sadness and states that he wishes he was able to say goodbye and let the individual know how much he cared for them.   Duffy Rhody Counseling Intern

## 2015-04-28 NOTE — BHH Group Notes (Signed)
[facility] LCSW Aftercare Discharge Planning Group Note   04/28/2015 9:44 AM  Participation Quality:  Appropriate   Mood/Affect:  Appropriate  Depression Rating:  6  Anxiety Rating:  6  Thoughts of Suicide:  No Will you contract for safety?   NA  Current AVH:  No  Plan for Discharge/Comments:  Pt reports lower back pain and wants flexoril med schedule changed. CSW encouraged him to speak with MD about this. Pt has [facility] referral pending.   Transportation Means: unknown at this time.   Supports: none identified   Smart, Photographer

## 2015-04-28 NOTE — BHH Group Notes (Signed)
[facility] LCSW Group Therapy  04/28/2015 1:28 PM  Type of Therapy:  Group Therapy  Participation Level:  Active  Participation Quality:  Attentive  Affect:  Appropriate  Cognitive:  Alert and Oriented  Insight:  Improving  Engagement in Therapy:  Improving  Modes of Intervention:  Confrontation, Discussion, Education, Exploration, Problem-solving, Rapport Building, Socialization and Support  Summary of Progress/Problems: Today's Topic: Overcoming Obstacles. Patients identified one short term goal and potential obstacles in reaching this goal. Patients processed barriers involved in overcoming these obstacles. Patients identified steps necessary for overcoming these obstacles and explored motivation (internal and external) for facing these difficulties head on. Royd was attentive and engaged during today's processing group. He shared that his biggest obstacle is getting into treatment. Pt optimistic about getting accepted to [facility] and plans to go "tomorrow or Wednesday." Pt continues to show progress at the group setting and improving insight.   Smart, Wille Aubuchon LCSW 04/28/2015, 1:28 PM

## 2015-04-28 NOTE — Progress Notes (Signed)
Adult Psychoeducational Group Note  Date:  04/28/2015 Time:  9:41 PM  Group Topic/Focus:  Wrap-Up Group:   The focus of this group is to help patients review their daily goal of treatment and discuss progress on daily workbooks.  Participation Level:  Active  Participation Quality:  Appropriate and Attentive  Affect:  Appropriate  Cognitive:  Appropriate  Insight: Appropriate  Engagement in Group:  Engaged  Modes of Intervention:  Discussion  Additional Comments:  Pt stated his day was "absolutely awesome." Pt stated he is on the waitlist for [facility] and his goal for tomorrow is to have patience until he leaves, and decrease his anxiety.  Clint Bolder 04/28/2015, 9:41 PM

## 2015-04-29 MED ORDER — QUETIAPINE FUMARATE 400 MG PO TABS
400.0000 mg | ORAL_TABLET | Freq: Every day | ORAL | Status: DC
  Filled 2015-04-29: qty 1
  Filled 2015-04-29: qty 7

## 2015-04-29 MED ORDER — HYDROXYZINE HCL 50 MG PO TABS: 50.0000 mg | ORAL_TABLET | Freq: Four times a day (QID) | ORAL | Status: DC | PRN

## 2015-04-29 MED ORDER — METOPROLOL TARTRATE 25 MG PO TABS: 25.0000 mg | ORAL_TABLET | Freq: Two times a day (BID) | ORAL | Status: DC

## 2015-04-29 MED ORDER — CYCLOBENZAPRINE HCL 10 MG PO TABS: 10.0000 mg | ORAL_TABLET | Freq: Two times a day (BID) | ORAL | Status: DC

## 2015-04-29 MED ORDER — QUETIAPINE FUMARATE 400 MG PO TABS: 400.0000 mg | ORAL_TABLET | Freq: Every day | ORAL | Status: DC

## 2015-04-29 MED ORDER — GABAPENTIN 300 MG PO CAPS: 300.0000 mg | ORAL_CAPSULE | Freq: Three times a day (TID) | ORAL | Status: DC

## 2015-04-29 MED ORDER — CLOPIDOGREL BISULFATE 75 MG PO TABS: 75.0000 mg | ORAL_TABLET | Freq: Every day | ORAL | Status: DC

## 2015-04-29 MED ORDER — ASPIRIN 81 MG PO TBEC: 81.0000 mg | DELAYED_RELEASE_TABLET | Freq: Every day | ORAL | Status: DC

## 2015-04-29 MED ORDER — LOSARTAN POTASSIUM 25 MG PO TABS: 25.0000 mg | ORAL_TABLET | Freq: Every day | ORAL | Status: DC

## 2015-04-29 MED ORDER — SIMVASTATIN 40 MG PO TABS: 40.0000 mg | ORAL_TABLET | Freq: Every morning | ORAL | Status: DC

## 2015-04-29 MED ORDER — NICOTINE 21 MG/24HR TD PT24: 21.0000 mg | MEDICATED_PATCH | Freq: Every day | TRANSDERMAL | Status: DC

## 2015-04-29 NOTE — Discharge Summary (Addendum)
Physician Discharge Summary Note  Patient:  Larry French is an 55 y.o., male MRN:  [id number] DOB:  [birth year] Patient phone:  [phone] (home)  Patient address:   [address]  [place] [address],  Total Time spent with patient: 45 minutes  Date of Admission:  04/22/2015 Date of Discharge: 04/29/2015  Reason for Admission:   55 Y/o male who states he has been on Xanax 1 four times a day for five years on bad days took an extra one. States he checked himself in [facility] the second week of December. He wanted to come off the Xanax. He was detox in 5 days. States the anxiety got really bad, having tremors confusion. States that after he left there he relapsed on alcohol he was kicked out from her parents house once he relapse. He has been drinking 3 bottles of white wine a day for 2-3 weeks. Had been clean and sober for 5 years. Brother died in a car accident 07-11-14. Moved back home to take care of his parents. He stop church stop AA meetings secluded himself just took care of his mother and father. States he has suffered depression for years worst last 3 weeks. The depression he states is not related to his alcohol use. He experienced depression even when he was not drinking.   Principal Problem: Benzodiazepine dependence [facility]) Discharge Diagnoses: Patient Active Problem List   Diagnosis Date Noted  . Benzodiazepine dependence ([place]) [F13.20] 04/25/2015  . Alcohol abuse with alcohol-induced mood disorder ([place]) [F10.14] 04/22/2015  . GAD (generalized anxiety disorder) [F41.1]   . Severe episode of recurrent major depressive disorder, without psychotic features ([place]) [F33.2]   . Alcohol abuse [F10.10] 04/17/2015  . Benzodiazepine abuse [F13.10] 04/17/2015  . Posttraumatic stress disorder [F43.10] 04/17/2015  . Suicidal ideation [R45.851] 04/17/2015  . Abnormal EKG [R94.31]   . Benzodiazepine withdrawal ([place]) [F13.239]   . NSTEMI (non-ST elevated myocardial infarction)  ([place]) [I21.4]   . Positive cardiac stress test [R94.39]   . Elevated troponin [R79.89]   . Withdrawal from benzodiazepine ([place]) [F13.239] 04/05/2015  . STEMI, RCA DES 05/28/10 [I21.3] 06/01/2011  . CAD, residual CFX LAD disease [I25.10] 06/01/2011  . Tobacco abuse [Z72.0] 05/29/2011  . History of ETOH abuse and Xanax abuse. In rehab pta [F10.10] 05/29/2011    Past Psychiatric History: See H&P  Past Medical History:  Past Medical History  Diagnosis Date  . Alcohol abuse     Recovered x 15 months  . CA - skin cancer   . Mental disorder   . Headache(784.0)   . Anxiety   . Depression   . CAD, residual CFX LAD disease 06/01/2011    a. s/p inferior ST elevation MI s/p PCI/DES to RCA on 05/29/2011; b. residual LAD and LCx CAD tx'd on 06/02/11 cath with LAD CAD successfully tx'd w/ PCI/DES, LCx managaged medically   . Hypertension   . Hyperlipemia   . PTSD (post-traumatic stress disorder)   . Polysubstance abuse     a. etoh, xanax, tobacco  . MI (myocardial infarction) [facility])     Past Surgical History  Procedure Laterality Date  . Tonsillectomy and adenoidectomy    . Left heart cath Right 05/29/2011    Procedure: LEFT HEART CATH;  Surgeon: Leonie Man, MD;  Location: [facility] CATH LAB;  Service: Cardiovascular;  Laterality: Right;  . Left heart catheterization with coronary angiogram [name] 06/01/2011    Procedure: LEFT HEART CATHETERIZATION WITH CORONARY ANGIOGRAM;  Surgeon: Leonie Man,  MD;  Location: [facility] CATH LAB;  Service: Cardiovascular;  Laterality: [name];  relook cath, possible PCI  . Cardiac catheterization Bilateral 04/09/2015    Procedure: Coronary Angiogram;  Surgeon: Wellington Hampshire, MD;  Location: [facility] CV LAB;  Service: Cardiovascular;  Laterality: Bilateral;  . Cardiac catheterization [name] 04/09/2015    Procedure: Coronary Stent Intervention;  Surgeon: Wellington Hampshire, MD;  Location: [facility] CV LAB;  Service: Cardiovascular;  Laterality: [name];   Family History:   Family History  Problem Relation Age of Onset  . Coronary artery disease Maternal Grandfather 53    Died   Family Psychiatric  History: See H&P Social History:  History  Alcohol Use  . Yes    Comment: this week     History  Drug Use No    Social History   Social History  . Marital Status: Single    Spouse Name: [name]  . Number of Children: 0  . Years of Education: [name]   Occupational History  . Student    Social History Main Topics  . Smoking status: Current Every Day Smoker -- 1.50 packs/day for 30 years    Types: Cigarettes  . Smokeless tobacco: None  . Alcohol Use: Yes     Comment: this week  . Drug Use: No  . Sexual Activity: No   Other Topics Concern  . None   Social History Narrative    Hospital Course:   Larry French was admitted for Benzodiazepine dependence [facility]) , with psychosis and crisis management.  Pt was treated discharged with the medications listed below under Medication List.  Medical problems were identified and treated as needed.  Home medications were restarted as appropriate.   Improvement was monitored by observation and Larry French 's daily report of symptom reduction.  Emotional and mental status was monitored by daily self-inventory reports completed by Larry French and clinical staff.         Larry French was evaluated by the treatment team for stability and plans for continued recovery upon discharge. Larry French 's motivation was an integral factor for scheduling further treatment. Employment, transportation, bed availability, health status, family support, and any pending legal issues were also considered during hospital stay. Pt was offered further treatment options upon discharge including but not limited to Residential, Intensive Outpatient, and Outpatient treatment.  Larry French will follow up with the services as listed below under Follow Up Information.     Upon completion of this  admission the patient was both mentally and medically stable for discharge denying suicidal/homicidal ideation, auditory/visual/tactile hallucinations, delusional thoughts and paranoia.    Larry French responded well to treatment with FLexeril, Neurontin, Vistaril, Nicotine, Seroquel. without adverse effects. Pt demonstrated improvement without reported or observed adverse effects to the point of stability appropriate for outpatient management. Pertinent labs include: UDS+ benzo, BAL 118. Reviewed CBC, CMP, BAL, and UDS; all unremarkable aside from noted exceptions.   Physical Findings: AIMS: Facial and Oral Movements Muscles of Facial Expression: None, normal Lips and Perioral Area: None, normal Jaw: None, normal Tongue: None, normal,Extremity Movements Upper (arms, wrists, hands, fingers): None, normal Lower (legs, knees, ankles, toes): None, normal, Trunk Movements Neck, shoulders, hips: None, normal, Overall Severity Severity of abnormal movements (highest score from questions above): None, normal Incapacitation due to abnormal movements: None, normal Patient's awareness of abnormal movements (rate only patient's report): No Awareness, Dental Status Current problems with teeth and/or dentures?: No Does patient usually  wear dentures?: Yes  CIWA:  CIWA-Ar Total: 1 COWS:  COWS Total Score: 2  Musculoskeletal: Strength & Muscle Tone: within normal limits Gait & Station: normal Patient leans: [name]  Psychiatric Specialty Exam: Review of Systems  Psychiatric/Behavioral: Positive for depression and substance abuse. Negative for suicidal ideas and hallucinations. The patient is nervous/anxious and has insomnia.   All other systems reviewed and are negative.   Blood pressure 116/88, pulse 111, temperature 97.7 F (36.5 C), temperature source Oral, resp. rate 18, height 5\' 8"  (1.727 m), weight 83.915 kg (185 lb), SpO2 97 %.Body mass index is 28.14 kg/(m^2).  SEE MD PSE within the [place]       Has this patient used any form of tobacco in the last 30 days? (Cigarettes, Smokeless Tobacco, Cigars, and/or Pipes) Yes, Yes, A prescription for an FDA-approved tobacco cessation medication was offered at discharge and the patient refused  Metabolic Disorder Labs:  Lab Results  Component Value Date   HGBA1C 5.0 04/06/2015   No results found for: PROLACTIN Lab Results  Component Value Date   CHOL 158 04/06/2015   TRIG 234* 04/06/2015   HDL 44 04/06/2015   CHOLHDL 3.6 04/06/2015   VLDL 47* 04/06/2015   LDLCALC 67 04/06/2015   [place] 98 06/01/2011    See Psychiatric Specialty Exam and Suicide Risk Assessment completed by Attending Physician prior to discharge.  Discharge destination:  Home  Is patient on multiple antipsychotic therapies at discharge:  No   Has Patient had three or more failed trials of antipsychotic monotherapy by history:  No  Recommended Plan for Multiple Antipsychotic Therapies: NA     Medication List    STOP taking these medications        busPIRone 5 MG tablet  Commonly known as:  BUSPAR     chlordiazePOXIDE 25 MG capsule  Commonly known as:  LIBRIUM      TAKE these medications      Indication   aspirin 81 MG EC tablet  Take 1 tablet (81 mg total) by mouth daily.   Indication:  heart health     clopidogrel 75 MG tablet  Commonly known as:  PLAVIX  Take 1 tablet (75 mg total) by mouth daily.   Indication:  anticoagulation     cyclobenzaprine 10 MG tablet  Commonly known as:  FLEXERIL  Take 1 tablet (10 mg total) by mouth 2 (two) times daily.   Indication:  Muscle Spasm     gabapentin 300 MG capsule  Commonly known as:  NEURONTIN  Take 1 capsule (300 mg total) by mouth 3 (three) times daily.   Indication:  Neuropathic Pain     hydrOXYzine 50 MG tablet  Commonly known as:  ATARAX/VISTARIL  Take 1 tablet (50 mg total) by mouth every 6 (six) hours as needed for anxiety.   Indication:  Anxiety Neurosis     losartan 25 MG  tablet  Commonly known as:  COZAAR  Take 1 tablet (25 mg total) by mouth daily.   Indication:  High Blood Pressure     metoprolol tartrate 25 MG tablet  Commonly known as:  LOPRESSOR  Take 1 tablet (25 mg total) by mouth 2 (two) times daily.   Indication:  High Blood Pressure     nicotine 21 mg/24hr patch  Commonly known as:  NICODERM CQ - dosed in mg/24 hours  Place 1 patch (21 mg total) onto the skin daily.   Indication:  Nicotine Addiction     nitroGLYCERIN 0.4  MG SL tablet  Commonly known as:  NITROSTAT  Place 0.4 mg under the tongue every 5 (five) minutes as needed for chest pain.      QUEtiapine 400 MG tablet  Commonly known as:  SEROQUEL  Take 1 tablet (400 mg total) by mouth at bedtime.   Indication:  psychosis     simvastatin 40 MG tablet  Commonly known as:  ZOCOR  Take 1 tablet (40 mg total) by mouth every morning.   Indication:  mixed dyslipidemia           Follow-up Information    Follow up with [facility] On 04/29/2015.   Why:  Please go today for continued treatment, arrive by 2:30pm.   Contact information:   [address]  [place], [place] [address] Phone:  [phone] Fax:  [phone]     Follow-up recommendations:  Activity:  As tolerated Diet:  Heart healthy with low sodium  Comments:   Take all medications as prescribed. Keep all follow-up appointments as scheduled.  Do not consume alcohol or use illegal drugs while on prescription medications. Report any adverse effects from your medications to your primary care provider promptly.  In the event of recurrent symptoms or worsening symptoms, call 911, a crisis hotline, or go to the nearest emergency department for evaluation.   Signed: Benjamine Mola, FNP-BC 04/29/2015, 12:14 PM  I personally assessed the patient and formulated the plan Geralyn Flash A. Sabra Heck, M.D.

## 2015-04-29 NOTE — Progress Notes (Signed)
Patient ID: Larry French, male   DOB: 02/15/1961, 55 y.o.   MRN: [id number] D: Patient alert and cooperative. Pt reports feeling less anxious. Pt hopeful about going to [facility]. Pt denies SI/HI/AVH and pain. No acute physical distress noted.  A: Medications administered as prescribed. Emotional support and encouragement given. R: Patient remains safe and complaint with medications. Pt attended evening wrap up group.

## 2015-04-29 NOTE — Plan of Care (Signed)
Problem: Diagnosis: Increased Risk For Suicide Attempt Goal: STG-Patient Will Comply With Medication Regime Outcome: Progressing Pt compliant with medication regime     

## 2015-04-29 NOTE — Progress Notes (Signed)
  [facility] Adult Case Management Discharge Plan :  Will you be returning to the same living situation after discharge:  No. Patient plans to discharge to [facility] At discharge, do you have transportation home?: Yes,  [facility] to transport Do you have the ability to pay for your medications: Yes,  patient will be provided with prescriptions   Release of information consent forms completed and in the chart;  Patient's signature needed at discharge.  Patient to Follow up at: Follow-up Information    Follow up with [facility] On 04/29/2015.   Why:  Please go today for continued treatment, arrive by 2:30pm.   Contact information:   [address]  [place], [place] [address] Phone:  [phone] Fax:  [phone]      Next level of care provider has access to [place] and Suicide Prevention discussed: Yes,  with patient     Has patient been referred to the Quitline?: No, patient declined.  Patient has been referred for addiction treatment: Yes  Larry French, Casimiro Needle 04/29/2015, 10:51 AM

## 2015-04-29 NOTE — BHH Suicide Risk Assessment (Signed)
[facility] Discharge Suicide Risk Assessment   Demographic Factors:  Caucasian and Gay, lesbian, or bisexual orientation  Total Time spent with patient: 20 minutes  Musculoskeletal: Strength & Muscle Tone: within normal limits Gait & Station: normal Patient leans: normal  Psychiatric Specialty Exam: Physical Exam  Review of Systems  Constitutional: Negative.   HENT: Negative.   Eyes: Negative.   Respiratory: Negative.   Cardiovascular: Negative.   Gastrointestinal: Negative.   Genitourinary: Negative.   Musculoskeletal: Negative.   Skin: Negative.   Neurological: Negative.   Endo/Heme/Allergies: Negative.   Psychiatric/Behavioral: Positive for substance abuse. The patient is nervous/anxious.     Blood pressure 116/88, pulse 111, temperature 97.7 F (36.5 C), temperature source Oral, resp. rate 18, height 5\' 8"  (1.727 m), weight 83.915 kg (185 lb), SpO2 97 %.Body mass index is 28.14 kg/(m^2).  General Appearance: Fairly Groomed  Engineer, water::  Fair  Speech:  Clear and [id number]  Volume:  Normal  Mood:  Anxious  Affect:  anxious  Thought Process:  Coherent and Goal Directed  Orientation:  Full (Time, Place, and Person)  Thought Content:  plans as he moves on, relapse prevention plan  Suicidal Thoughts:  No  Homicidal Thoughts:  No  Memory:  Immediate;   Fair Recent;   Fair Remote;   Fair  Judgement:  Fair  Insight:  Present  Psychomotor Activity:  Restlessness  Concentration:  Fair  Recall:  [employer] of Knowledge:Fair  Language: Fair  Akathisia:  No  Handed:  Right  AIMS (if indicated):     Assets:  Desire for Improvement  Sleep:  Number of Hours: 6.75  Cognition: WNL  ADL's:  Intact      Has this patient used any form of tobacco in the last 30 days? (Cigarettes, Smokeless Tobacco, Cigars, and/or Pipes) nicotine patches will be provided  Mental Status Per Nursing Assessment::   On Admission:  Self-harm thoughts  Current Mental Status by Physician: In full  contact with reality. There are no active S/S of withdrawal. He did not sleep too well last night. Would like to have the Seroquel increased to 400 mg HS. He states he really wants  this to work for him. He does not want to depend on substances to function. Looking forward to go to [facility]   Loss Factors: Loss of significant relationship  Historical Factors: NA  Risk Reduction Factors:   Sense of responsibility to family  Continued Clinical Symptoms:  Depression:   Comorbid alcohol abuse/dependence Alcohol/Substance Abuse/Dependencies  Cognitive Features That Contribute To Risk:  Closed-mindedness, Polarized thinking and Thought constriction (tunnel vision)    Suicide Risk:  Minimal: No identifiable suicidal ideation.  Patients presenting with no risk factors but with morbid ruminations; may be classified as minimal risk based on the severity of the depressive symptoms  Principal Problem: Benzodiazepine dependence [facility]) Discharge Diagnoses:  Patient Active Problem List   Diagnosis Date Noted  . Benzodiazepine dependence ([place]) [F13.20] 04/25/2015  . Alcohol abuse with alcohol-induced mood disorder ([place]) [F10.14] 04/22/2015  . GAD (generalized anxiety disorder) [F41.1]   . Severe episode of recurrent major depressive disorder, without psychotic features ([place]) [F33.2]   . Alcohol abuse [F10.10] 04/17/2015  . Benzodiazepine abuse [F13.10] 04/17/2015  . Posttraumatic stress disorder [F43.10] 04/17/2015  . Suicidal ideation [R45.851] 04/17/2015  . Abnormal EKG [R94.31]   . Benzodiazepine withdrawal ([place]) [F13.239]   . NSTEMI (non-ST elevated myocardial infarction) ([place]) [I21.4]   . Positive cardiac stress test [R94.39]   . Elevated  troponin [R79.89]   . Withdrawal from benzodiazepine ([place]) [F13.239] 04/05/2015  . STEMI, RCA DES 05/28/10 [I21.3] 06/01/2011  . CAD, residual CFX LAD disease [I25.10] 06/01/2011  . Tobacco abuse [Z72.0] 05/29/2011  . History of ETOH abuse and Xanax abuse.  In rehab pta [F10.10] 05/29/2011    Follow-up Information    Follow up with [facility] On 04/29/2015.   Why:  Please go today for continued treatment, arrive by 2:30pm.   Contact information:   [address]  [place], [place] [address] Phone:  [phone] Fax:  [phone]      Plan Of Care/Follow-up recommendations:  Activity:  as tolerated Diet:  regular Follow  Up [facility] as above Is patient on multiple antipsychotic therapies at discharge:  No   Has Patient had three or more failed trials of antipsychotic monotherapy by history:  No  Recommended Plan for Multiple Antipsychotic Therapies: NA    Kailin Principato A 04/29/2015, 12:58 PM

## 2015-04-29 NOTE — BHH Group Notes (Signed)
[place] LCSW Group Therapy  04/29/2015 1:27 PM  Type of Therapy:  Group Therapy  Participation Level:  Active  Participation Quality:  Attentive  Affect:  Appropriate  Cognitive:  Alert and Oriented  Insight:  Improving  Engagement in Therapy:  Improving  Modes of Intervention:  Discussion, Education, Exploration, Problem-solving, Rapport Building, Socialization and Support  Summary of Progress/Problems: [facility] Speaker came to talk about his personal journey with substance abuse and addiction. The pt processed ways by which to relate to the speaker. [place] speaker provided handouts and educational information pertaining to groups and services offered by the [facility].   Smart, Fauna Neuner LCSW 04/29/2015, 1:27 PM

## 2015-04-29 NOTE — Tx Team (Addendum)
Interdisciplinary Treatment Plan Update (Adult)  Date:  04/29/2015  Time Reviewed:  9:45 AM   Progress in Treatment: Attending groups: Yes Participating in groups:  Yes Taking medication as prescribed:  Yes. Tolerating medication:  Yes. Family/Significant othe contact made:  No, patient has declined collateral contact Patient understands diagnosis:  Yes. AEB seeking treatment for ETOH abuse, recent xanax abuse, depression, passive SI and med stabilization. Discussing patient identified problems/goals with staff:  Yes. Medical problems stabilized or resolved:  Yes. Denies suicidal/homicidal ideation: Yes. Issues/concerns per patient self-inventory:  Other:  Discharge Plan or Barriers: Patient plans to go to [facility] for continued treatment.  Reason for Continuation of Hospitalization: Depression Medication stabilization Withdrawal symptoms  Comments:  Larry French is an 55 y.o. male who presents voluntarily to [facility] as a walk in. Pt reported "I need help. Lost everything. Lil brother died 07/13/14 in car crash. Not handling life any more. Gave up." Pt reported that he was in a "crisis" and needed help "right now". Pt did not endorse SI, indicating that it was "on the table", but that he was "not actively pursuing it". Pt denied HI and AVH. Pt's mood and affect was apathetic and pleasant. His speech was slightly tangential and hard to follow, at times. He was oriented x 4. Pt indicated that he has had a problem with alcoholism for many years and was sober for 4-5 years. He reported being prescribed Xanax, to help with his depression and becoming hooked on it. He continued that he stopped taking the Xanax @ 04/03/15 and started back drinking to compensate. Pt stated he last had a drink this morning and was starting to feel DTs coming on. Pt also reported that he was living with his parents and once they discovered he was drinking again, they kicked him out of the house. Pt reported having  repeated panic attacks during the day. He stated that he was med compliant and that he has an appt with [facility] on 1/6th for med mgmt, but felt that they would not give him what he needed. Diagnosis: Alcohol Use Disorder, moderate  Estimated length of stay:  Discharge anticipated for today 04/29/15  New goal(s): to develop effective aftercare plan.   Additional Comments:  Patient and CSW reviewed pt's identified goals and treatment plan. Patient verbalized understanding and agreed to treatment plan. CSW reviewed [facility] "Discharge Process and Patient Involvement" Form. Pt verbalized understanding of information provided and signed form.    Review of initial/current patient goals per problem list:  1. Goal(s): Patient will participate in aftercare plan  Met: Yes  Target date: at discharge  As evidenced by: Patient will participate within aftercare plan AEB aftercare provider and housing plan at discharge being identified.  1/6: CSW assessing for appropriate referrals.  1/10: Goal met. Patient plans to discharge to [facility].  2. Goal (s): Patient will exhibit decreased depressive symptoms and suicidal ideations.  Met: Adequate for discharge per MD   Target date: at discharge  As evidenced by: Patient will utilize self rating of depression at 3 or below and demonstrate decreased signs of depression or be deemed stable for discharge by MD.  1/6: Pt rates depression as high and denies SI/HI/AVH at this time.  1/10: Adequate for discharge. Patient reports baseline depression and denies SI.  3. Goal(s): Patient will demonstrate decreased signs of withdrawal due to substance abuse  Met:Yes  Target date:at discharge   As evidenced by: Patient will produce a CIWA/COWS score of 0, have stable  vitals signs, and no symptoms of withdrawal.  1/6: Pt reports moderate withdrawals with high BP and CIWA of 2.  1/10: Goal met. No withdrawal symptoms reported at this time per medical chart.      Attendees: Patient:    Family:    Physician: Dr. Parke Poisson; Dr. Sabra Heck 04/29/2015 9:30 AM  Nursing: Elesa Massed, [place] Ormond, [place], RN 04/29/2015 9:30 AM  Clinical Social Worker: Tilden Fossa,  [place] 04/29/2015 9:30 AM  Other: Peri Maris, LCSWA; [place], LCSW  04/29/2015 9:30 AM  Other:  04/29/2015 9:30 AM  Other:  04/29/2015 9:30 AM  Other: Catalina Pizza, NP 04/29/2015 9:30 AM  Other:      Scribe for Treatment Team:   Tilden Fossa, MSW, [facility] Clinical Social Worker [facility] [phone]

## 2015-05-20 ENCOUNTER — Encounter: Payer: Self-pay | Admitting: *Deleted

## 2015-05-20 ENCOUNTER — Encounter: Payer: Self-pay | Admitting: Nurse Practitioner

## 2015-06-18 ENCOUNTER — Encounter (HOSPITAL_COMMUNITY): Payer: Self-pay | Admitting: Emergency Medicine

## 2015-06-18 ENCOUNTER — Ambulatory Visit (HOSPITAL_COMMUNITY)
Admission: RE | Admit: 2015-06-18 | Discharge: 2015-06-18 | Disposition: A | Discharge status: Home / Self Care | Payer: No Typology Code available for payment source | Attending: Psychiatry | Admitting: Psychiatry

## 2015-06-18 ENCOUNTER — Emergency Department (HOSPITAL_COMMUNITY)
Admission: EM | Admit: 2015-06-18 | Discharge: 2015-06-19 | Disposition: A | Discharge status: Home / Self Care | Payer: Self-pay | Attending: Emergency Medicine | Admitting: Emergency Medicine

## 2015-06-18 DIAGNOSIS — E785 Hyperlipidemia, unspecified: Secondary | ICD-10-CM | POA: Insufficient documentation

## 2015-06-18 DIAGNOSIS — F329 Major depressive disorder, single episode, unspecified: Secondary | ICD-10-CM | POA: Insufficient documentation

## 2015-06-18 DIAGNOSIS — F99 Mental disorder, not otherwise specified: Secondary | ICD-10-CM | POA: Insufficient documentation

## 2015-06-18 DIAGNOSIS — Z7982 Long term (current) use of aspirin: Secondary | ICD-10-CM | POA: Insufficient documentation

## 2015-06-18 DIAGNOSIS — F17210 Nicotine dependence, cigarettes, uncomplicated: Secondary | ICD-10-CM | POA: Insufficient documentation

## 2015-06-18 DIAGNOSIS — F1010 Alcohol abuse, uncomplicated: Secondary | ICD-10-CM | POA: Insufficient documentation

## 2015-06-18 DIAGNOSIS — F4310 Post-traumatic stress disorder, unspecified: Secondary | ICD-10-CM | POA: Insufficient documentation

## 2015-06-18 DIAGNOSIS — I252 Old myocardial infarction: Secondary | ICD-10-CM | POA: Insufficient documentation

## 2015-06-18 DIAGNOSIS — I10 Essential (primary) hypertension: Secondary | ICD-10-CM | POA: Insufficient documentation

## 2015-06-18 DIAGNOSIS — I2510 Atherosclerotic heart disease of native coronary artery without angina pectoris: Secondary | ICD-10-CM | POA: Insufficient documentation

## 2015-06-18 DIAGNOSIS — Z85828 Personal history of other malignant neoplasm of skin: Secondary | ICD-10-CM | POA: Insufficient documentation

## 2015-06-18 DIAGNOSIS — Z9889 Other specified postprocedural states: Secondary | ICD-10-CM | POA: Insufficient documentation

## 2015-06-18 DIAGNOSIS — F1110 Opioid abuse, uncomplicated: Secondary | ICD-10-CM | POA: Insufficient documentation

## 2015-06-18 DIAGNOSIS — Z79899 Other long term (current) drug therapy: Secondary | ICD-10-CM | POA: Insufficient documentation

## 2015-06-18 DIAGNOSIS — F411 Generalized anxiety disorder: Secondary | ICD-10-CM | POA: Insufficient documentation

## 2015-06-18 DIAGNOSIS — F419 Anxiety disorder, unspecified: Secondary | ICD-10-CM | POA: Insufficient documentation

## 2015-06-18 DIAGNOSIS — Z7902 Long term (current) use of antithrombotics/antiplatelets: Secondary | ICD-10-CM | POA: Insufficient documentation

## 2015-06-18 DIAGNOSIS — F1990 Other psychoactive substance use, unspecified, uncomplicated: Secondary | ICD-10-CM | POA: Insufficient documentation

## 2015-06-18 LAB — CBC
HEMATOCRIT: 46.8 % (ref 39.0–52.0)
HEMOGLOBIN: 15.7 g/dL (ref 13.0–17.0)
MCH: 32.1 pg (ref 26.0–34.0)
MCHC: 33.5 g/dL (ref 30.0–36.0)
MCV: 95.7 fL (ref 78.0–100.0)
Platelets: 235 10*3/uL (ref 150–400)
RBC: 4.89 MIL/uL (ref 4.22–5.81)
RDW: 13.4 % (ref 11.5–15.5)
WBC: 17 10*3/uL — AB (ref 4.0–10.5)

## 2015-06-18 LAB — COMPREHENSIVE METABOLIC PANEL
ALBUMIN: 4.7 g/dL (ref 3.5–5.0)
ALT: 24 U/L (ref 17–63)
ANION GAP: 14 (ref 5–15)
AST: 22 U/L (ref 15–41)
Alkaline Phosphatase: 94 U/L (ref 38–126)
BUN: 10 mg/dL (ref 6–20)
CHLORIDE: 103 mmol/L (ref 101–111)
CO2: 21 mmol/L — AB (ref 22–32)
Calcium: 9.3 mg/dL (ref 8.9–10.3)
Creatinine, Ser: 1.13 mg/dL (ref 0.61–1.24)
GFR calc non Af Amer: >60 mL/min (ref >60)
Glucose, Bld: 88 mg/dL (ref 65–99)
POTASSIUM: 3.7 mmol/L (ref 3.5–5.1)
SODIUM: 138 mmol/L (ref 135–145)
Total Bilirubin: 0.9 mg/dL (ref 0.3–1.2)
Total Protein: 7.7 g/dL (ref 6.5–8.1)

## 2015-06-18 LAB — RAPID URINE DRUG SCREEN, HOSP PERFORMED
Amphetamines: NOT DETECTED
BARBITURATES: NOT DETECTED
BENZODIAZEPINES: NOT DETECTED
Cocaine: NOT DETECTED
Opiates: POSITIVE — AB
TETRAHYDROCANNABINOL: NOT DETECTED

## 2015-06-18 LAB — ETHANOL: Alcohol, Ethyl (B): 34 mg/dL — ABNORMAL HIGH (ref <5)

## 2015-06-18 NOTE — Discharge Instructions (Signed)
Continue taking your home medications as prescribed.  

## 2015-06-18 NOTE — ED Notes (Signed)
Report called to [facility]. Pt will be transferred to [facility] by Mitchel Honour called for transportation.
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
## 2015-06-18 NOTE — BH Assessment (Signed)
Pt has been assigned to [facility] 302 bed 2 by Inocencio Homes, [facility].

## 2015-06-18 NOTE — ED Notes (Signed)
Pt states that he is here for alcohol detox. Has bed across the street, just needs medical clearance. Denies SI/HI. States he drank 4 bottles of chardonnay tonight. Alert and oriented.

## 2015-06-19 ENCOUNTER — Encounter (HOSPITAL_COMMUNITY): Payer: Self-pay | Admitting: *Deleted

## 2015-06-19 ENCOUNTER — Inpatient Hospital Stay (HOSPITAL_COMMUNITY)
Admission: EM | Admit: 2015-06-19 | Discharge: 2015-06-26 | DRG: 897 | Disposition: A | Discharge status: Home / Self Care | Payer: No Typology Code available for payment source | Source: Intra-hospital | Attending: Psychiatry | Admitting: Psychiatry

## 2015-06-19 DIAGNOSIS — Z7902 Long term (current) use of antithrombotics/antiplatelets: Secondary | ICD-10-CM

## 2015-06-19 DIAGNOSIS — M62838 Other muscle spasm: Secondary | ICD-10-CM | POA: Diagnosis present

## 2015-06-19 DIAGNOSIS — Z85828 Personal history of other malignant neoplasm of skin: Secondary | ICD-10-CM | POA: Diagnosis not present

## 2015-06-19 DIAGNOSIS — Z590 Homelessness: Secondary | ICD-10-CM

## 2015-06-19 DIAGNOSIS — E782 Mixed hyperlipidemia: Secondary | ICD-10-CM | POA: Diagnosis present

## 2015-06-19 DIAGNOSIS — Z7982 Long term (current) use of aspirin: Secondary | ICD-10-CM

## 2015-06-19 DIAGNOSIS — F4310 Post-traumatic stress disorder, unspecified: Secondary | ICD-10-CM | POA: Diagnosis present

## 2015-06-19 DIAGNOSIS — I10 Essential (primary) hypertension: Secondary | ICD-10-CM | POA: Diagnosis present

## 2015-06-19 DIAGNOSIS — F411 Generalized anxiety disorder: Secondary | ICD-10-CM | POA: Diagnosis not present

## 2015-06-19 DIAGNOSIS — F10988 Alcohol use, unspecified with other alcohol-induced disorder: Secondary | ICD-10-CM | POA: Diagnosis not present

## 2015-06-19 DIAGNOSIS — F1014 Alcohol abuse with alcohol-induced mood disorder: Principal | ICD-10-CM | POA: Diagnosis present

## 2015-06-19 DIAGNOSIS — Z8249 Family history of ischemic heart disease and other diseases of the circulatory system: Secondary | ICD-10-CM

## 2015-06-19 DIAGNOSIS — I252 Old myocardial infarction: Secondary | ICD-10-CM

## 2015-06-19 DIAGNOSIS — Z955 Presence of coronary angioplasty implant and graft: Secondary | ICD-10-CM | POA: Diagnosis not present

## 2015-06-19 DIAGNOSIS — I2510 Atherosclerotic heart disease of native coronary artery without angina pectoris: Secondary | ICD-10-CM | POA: Diagnosis present

## 2015-06-19 DIAGNOSIS — F410 Panic disorder [episodic paroxysmal anxiety] without agoraphobia: Secondary | ICD-10-CM | POA: Diagnosis present

## 2015-06-19 DIAGNOSIS — F17210 Nicotine dependence, cigarettes, uncomplicated: Secondary | ICD-10-CM | POA: Diagnosis present

## 2015-06-19 DIAGNOSIS — F332 Major depressive disorder, recurrent severe without psychotic features: Secondary | ICD-10-CM | POA: Diagnosis not present

## 2015-06-19 DIAGNOSIS — Z818 Family history of other mental and behavioral disorders: Secondary | ICD-10-CM

## 2015-06-19 DIAGNOSIS — R45851 Suicidal ideations: Secondary | ICD-10-CM | POA: Diagnosis present

## 2015-06-19 DIAGNOSIS — G4700 Insomnia, unspecified: Secondary | ICD-10-CM | POA: Diagnosis present

## 2015-06-19 DIAGNOSIS — F10188 Alcohol abuse with other alcohol-induced disorder: Secondary | ICD-10-CM | POA: Diagnosis present

## 2015-06-19 MED ORDER — LORAZEPAM 1 MG PO TABS
1.0000 mg | ORAL_TABLET | Freq: Every day | ORAL | Status: AC
  Administered 2015-06-23: 1 mg via ORAL
  Filled 2015-06-19: qty 1

## 2015-06-19 MED ORDER — THIAMINE HCL 100 MG/ML IJ SOLN: 100.0000 mg | Freq: Once | INTRAMUSCULAR | Status: DC

## 2015-06-19 MED ORDER — LORAZEPAM 1 MG PO TABS
1.0000 mg | ORAL_TABLET | Freq: Two times a day (BID) | ORAL | Status: AC
  Administered 2015-06-21 – 2015-06-22 (×2): 1 mg via ORAL
  Filled 2015-06-19 (×2): qty 1

## 2015-06-19 MED ORDER — ASPIRIN EC 81 MG PO TBEC
81.0000 mg | DELAYED_RELEASE_TABLET | Freq: Every day | ORAL | Status: DC
  Administered 2015-06-19 – 2015-06-26 (×8): 81 mg via ORAL
  Filled 2015-06-19 (×10): qty 1

## 2015-06-19 MED ORDER — CYCLOBENZAPRINE HCL 10 MG PO TABS
10.0000 mg | ORAL_TABLET | Freq: Two times a day (BID) | ORAL | Status: DC
  Administered 2015-06-19 – 2015-06-26 (×15): 10 mg via ORAL
  Filled 2015-06-19 (×19): qty 1

## 2015-06-19 MED ORDER — METOPROLOL TARTRATE 25 MG PO TABS
25.0000 mg | ORAL_TABLET | Freq: Two times a day (BID) | ORAL | Status: DC
  Administered 2015-06-19 – 2015-06-26 (×14): 25 mg via ORAL
  Filled 2015-06-19 (×19): qty 1

## 2015-06-19 MED ORDER — SIMVASTATIN 40 MG PO TABS
40.0000 mg | ORAL_TABLET | Freq: Every morning | ORAL | Status: DC
  Administered 2015-06-19 – 2015-06-26 (×8): 40 mg via ORAL
  Filled 2015-06-19 (×10): qty 1

## 2015-06-19 MED ORDER — LORAZEPAM 1 MG PO TABS
1.0000 mg | ORAL_TABLET | Freq: Four times a day (QID) | ORAL | Status: AC
  Administered 2015-06-19 – 2015-06-20 (×5): 1 mg via ORAL
  Filled 2015-06-19 (×5): qty 1

## 2015-06-19 MED ORDER — ALUM & MAG HYDROXIDE-SIMETH 200-200-20 MG/5ML PO SUSP: 30.0000 mL | ORAL | Status: DC | PRN

## 2015-06-19 MED ORDER — LOPERAMIDE HCL 2 MG PO CAPS: 2.0000 mg | ORAL_CAPSULE | ORAL | Status: AC | PRN

## 2015-06-19 MED ORDER — QUETIAPINE FUMARATE 400 MG PO TABS
400.0000 mg | ORAL_TABLET | Freq: Every day | ORAL | Status: DC
  Administered 2015-06-19 – 2015-06-25 (×8): 400 mg via ORAL
  Filled 2015-06-19: qty 1
  Filled 2015-06-19: qty 2
  Filled 2015-06-19 (×9): qty 1

## 2015-06-19 MED ORDER — HYDROXYZINE HCL 50 MG PO TABS
50.0000 mg | ORAL_TABLET | Freq: Four times a day (QID) | ORAL | Status: DC | PRN
  Administered 2015-06-22 – 2015-06-25 (×3): 50 mg via ORAL
  Filled 2015-06-19 (×4): qty 1

## 2015-06-19 MED ORDER — CLOPIDOGREL BISULFATE 75 MG PO TABS
75.0000 mg | ORAL_TABLET | Freq: Every day | ORAL | Status: DC
  Administered 2015-06-19 – 2015-06-26 (×8): 75 mg via ORAL
  Filled 2015-06-19 (×10): qty 1

## 2015-06-19 MED ORDER — LORAZEPAM 1 MG PO TABS
1.0000 mg | ORAL_TABLET | Freq: Four times a day (QID) | ORAL | Status: AC | PRN
  Administered 2015-06-19 – 2015-06-20 (×2): 1 mg via ORAL
  Filled 2015-06-19 (×3): qty 1

## 2015-06-19 MED ORDER — ONDANSETRON 4 MG PO TBDP: 4.0000 mg | ORAL_TABLET | Freq: Four times a day (QID) | ORAL | Status: AC | PRN

## 2015-06-19 MED ORDER — NICOTINE 21 MG/24HR TD PT24
21.0000 mg | MEDICATED_PATCH | Freq: Every day | TRANSDERMAL | Status: DC
  Administered 2015-06-20 – 2015-06-26 (×7): 21 mg via TRANSDERMAL
  Filled 2015-06-19 (×12): qty 1

## 2015-06-19 MED ORDER — HYDROXYZINE HCL 25 MG PO TABS: 25.0000 mg | ORAL_TABLET | Freq: Four times a day (QID) | ORAL | Status: DC | PRN

## 2015-06-19 MED ORDER — MAGNESIUM HYDROXIDE 400 MG/5ML PO SUSP
30.0000 mL | Freq: Every day | ORAL | Status: DC | PRN
Start: 2015-06-19 — End: 2015-06-26

## 2015-06-19 MED ORDER — GABAPENTIN 300 MG PO CAPS
300.0000 mg | ORAL_CAPSULE | Freq: Three times a day (TID) | ORAL | Status: DC
  Administered 2015-06-19 – 2015-06-25 (×20): 300 mg via ORAL
  Filled 2015-06-19 (×25): qty 1

## 2015-06-19 MED ORDER — NITROGLYCERIN 0.4 MG SL SUBL: 0.4000 mg | SUBLINGUAL_TABLET | SUBLINGUAL | Status: DC | PRN

## 2015-06-19 MED ORDER — LOSARTAN POTASSIUM 25 MG PO TABS
25.0000 mg | ORAL_TABLET | Freq: Every day | ORAL | Status: DC
  Administered 2015-06-19 – 2015-06-26 (×8): 25 mg via ORAL
  Filled 2015-06-19 (×10): qty 1

## 2015-06-19 MED ORDER — LORAZEPAM 1 MG PO TABS
1.0000 mg | ORAL_TABLET | Freq: Three times a day (TID) | ORAL | Status: AC
  Administered 2015-06-20 – 2015-06-21 (×3): 1 mg via ORAL
  Filled 2015-06-19 (×3): qty 1

## 2015-06-19 MED ORDER — ADULT MULTIVITAMIN W/MINERALS CH
1.0000 | ORAL_TABLET | Freq: Every day | ORAL | Status: DC
  Administered 2015-06-19 – 2015-06-26 (×8): 1 via ORAL
  Filled 2015-06-19 (×10): qty 1

## 2015-06-19 MED ORDER — VITAMIN B-1 100 MG PO TABS
100.0000 mg | ORAL_TABLET | Freq: Every day | ORAL | Status: DC
  Administered 2015-06-20 – 2015-06-26 (×7): 100 mg via ORAL
  Filled 2015-06-19 (×9): qty 1

## 2015-06-19 NOTE — BHH Suicide Risk Assessment (Signed)
[facility] Admission Suicide Risk Assessment   Nursing information obtained from:    Demographic factors:    Current Mental Status:    Loss Factors:    Historical Factors:    Risk Reduction Factors:     Total Time spent with patient: 45 minutes Principal Problem: Severe episode of recurrent major depressive disorder, without psychotic features ([place]) Diagnosis:   Patient Active Problem List   Diagnosis Date Noted  . Alcohol abuse with alcohol-induced mental disorder ([place]) [F10.988] 06/19/2015  . Benzodiazepine dependence ([place]) [F13.20] 04/25/2015  . Alcohol abuse with alcohol-induced mood disorder ([place]) [F10.14] 04/22/2015  . GAD (generalized anxiety disorder) [F41.1]   . Severe episode of recurrent major depressive disorder, without psychotic features ([place]) [F33.2]   . Alcohol abuse [F10.10] 04/17/2015  . Benzodiazepine abuse [F13.10] 04/17/2015  . Posttraumatic stress disorder [F43.10] 04/17/2015  . Suicidal ideation [R45.851] 04/17/2015  . Abnormal EKG [R94.31]   . Benzodiazepine withdrawal ([place]) [F13.239]   . NSTEMI (non-ST elevated myocardial infarction) ([place]) [I21.4]   . Positive cardiac stress test [R94.39]   . Elevated troponin [R79.89]   . Withdrawal from benzodiazepine ([place]) [F13.239] 04/05/2015  . STEMI, RCA DES 05/28/10 [I21.3] 06/01/2011  . CAD, residual CFX LAD disease [I25.10] 06/01/2011  . Tobacco abuse [Z72.0] 05/29/2011  . History of ETOH abuse and Xanax abuse. In rehab pta [F10.10] 05/29/2011   Subjective Data: See admission H and P  Continued Clinical Symptoms:  Alcohol Use Disorder Identification Test Final Score (AUDIT): 21 The "Alcohol Use Disorders Identification Test", Guidelines for Use in Primary Care, Second Edition.  World Pharmacologist [facility]). Score between 0-7:  no or low risk or alcohol related problems. Score between 8-15:  moderate risk of alcohol related problems. Score between 16-19:  high risk of alcohol related problems. Score 20 or above:   warrants further diagnostic evaluation for alcohol dependence and treatment.   CLINICAL FACTORS:   Severe Anxiety and/or Agitation Depression:   Comorbid alcohol abuse/dependence Alcohol/Substance Abuse/Dependencies    Psychiatric Specialty Exam: ROS  Blood pressure 118/87, pulse 89, temperature 98.1 F (36.7 C), temperature source Oral, resp. rate 16, height 5\' 8"  (1.727 m), weight 85.276 kg (188 lb), SpO2 98 %.Body mass index is 28.59 kg/(m^2).   COGNITIVE FEATURES THAT CONTRIBUTE TO RISK:  Closed-mindedness, Polarized thinking and Thought constriction (tunnel vision)    SUICIDE RISK:   Mild:  Suicidal ideation of limited frequency, intensity, duration, and specificity.  There are no identifiable plans, no associated intent, mild dysphoria and related symptoms, good self-control (both objective and subjective assessment), few other risk factors, and identifiable protective factors, including available and accessible social support.  PLAN OF CARE: see admission H and P  I certify that inpatient services furnished can reasonably be expected to improve the patient's condition.   Nicholaus Bloom, MD 06/19/2015, 12:43 PM
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
## 2015-06-19 NOTE — Tx Team (Signed)
Initial Interdisciplinary Treatment Plan   PATIENT STRESSORS: Loss of brother Medication change or noncompliance Substance abuse   PATIENT STRENGTHS: Ability for insight Average or above average intelligence Capable of independent living General fund of knowledge Motivation for treatment/growth   PROBLEM LIST: Problem List/Patient Goals Date to be addressed Date deferred Reason deferred Estimated date of resolution  Depression 06/19/15     Suicidal Thoughts 06/19/15     Substance Abuse 06/19/15     "Need aftercare" 06/19/15                                    DISCHARGE CRITERIA:  Ability to meet basic life and health needs Improved stabilization in mood, thinking, and/or behavior Verbal commitment to aftercare and medication compliance Withdrawal symptoms are absent or subacute and managed without 24-hour nursing intervention  PRELIMINARY DISCHARGE PLAN: Attend aftercare/continuing care group  PATIENT/FAMIILY INVOLVEMENT: This treatment plan has been presented to and reviewed with the patient, [name], and/or family member, .  The patient and family have been given the opportunity to ask questions and make suggestions.  [place], [place] 06/19/2015, 1:37 AM

## 2015-06-19 NOTE — Progress Notes (Signed)
[facility] Group Notes:  (Nursing/MHT/Case Management/Adjunct)  Date:  06/19/2015  Time:  2100 Type of Therapy:  wrap up group  Participation Level:  Active  Participation Quality:  Appropriate, Attentive and Sharing  Affect:  Flat  Cognitive:  Appropriate  Insight:  Appropriate  Engagement in Group:  Limited  Modes of Intervention:  Clarification, Education and Support  Summary of Progress/Problems: Patient reports his appetite back and withdrawals improving slowly. Pt shares that he needs long term treatment; not 14 days, not 28 days, not 90 days. Pt wants a year long treatment but shared that he was a already a graduate of [facility]'s two year program.   Shellia Cleverly 06/19/2015, 10:08 PM

## 2015-06-19 NOTE — Progress Notes (Signed)
55 year old male pt admitted on voluntary basis. Pt reports on-going depression, suicidal thoughts and alcohol abuse issues. Pt reports on admission that he is beginning to withdrawal and does appear to be slightly tremulous and anxious. Pt also reports that he has been feeling suicidal but able to contract for safety while in the hospital. Pt reports that he needs help with detox and also with aftercare prior to discharge. Pt was oriented to milieu and safety maintained.

## 2015-06-19 NOTE — [facility] Group Notes (Signed)
Patient was invited to attend nursing education group however elected to remain in bed.
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
## 2015-06-19 NOTE — BHH Group Notes (Signed)
[place] LCSW Group Therapy  06/19/2015 12:38 PM  Type of Therapy:  Group Therapy  Participation Level:  Did Not Attend-pt invited. Chose to rest in room.   Modes of Intervention:  Confrontation, Discussion, Education, Exploration, Problem-solving, Rapport Building, Socialization and Support  Summary of Progress/Problems: Emotion Regulation: This group focused on both positive and negative emotion identification and allowed group members to process ways to identify feelings, regulate negative emotions, and find healthy ways to manage internal/external emotions. Group members were asked to reflect on a time when their reaction to an emotion led to a negative outcome and explored how alternative responses using emotion regulation would have benefited them. Group members were also asked to discuss a time when emotion regulation was utilized when a negative emotion was experienced.   Smart, Hulbert Branscome LCSW 06/19/2015, 12:38 PM

## 2015-06-20 NOTE — Progress Notes (Signed)
D: Patient is A&Ox4, denies SI, HI, and A/V hallucinations. Patient stated he felt shaky and lightheaded this morning, but is doing a little better this afternoon. Patient has been isolating in his room during groups. Patient presents as anxious but brightens upon approach. A: Medications given as scheduled and prn. Continue q15 minute checks for safety.  R: Patient remains safe. Patient verbalized understanding to let staff know if he no longer feels he can contract for safety.  Larry French, Thornton Dales, RN
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
## 2015-06-20 NOTE — Progress Notes (Signed)
Patient attended AA group meeting.  
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
## 2015-06-20 NOTE — [facility] Group Notes (Signed)
[facility] LCSW Aftercare Discharge Planning Group Note   06/20/2015 11:45 AM  Participation Quality:  Invited. DID NOT ATTEND. Pt chose to remain in room.   Smart, Drewey Begue LCSW

## 2015-06-20 NOTE — Progress Notes (Signed)
CSW and pt met individually to discuss long term aftercare options. Pt provided with: [facility], [facility], and [facility] information and applications. He was encouraged to read through material this weekend and speak with CSW on Monday about which program (s) that he is interested in being referred to. Pt's main concern is that he is able to go to a facility or halfway house where he can continue to take his seroquel and neurontin. CSW assessing.  Maxie Better, MSW, LCSW Clinical Social Worker 06/20/2015 3:53 PM

## 2015-06-20 NOTE — Progress Notes (Signed)
Recreation Therapy Notes  Date: 03.03.2017 Time: 9:30am Location: [address] Dayroom   Group Topic: Stress Management  Goal Area(s) Addresses:  Patient will actively participate in stress management techniques presented during session.   Behavioral Response: Did not attend.   Laureen Ochs Kycen Spalla, LRT/CTRS   Shannia Jacuinde L 06/20/2015 10:17 AM

## 2015-06-20 NOTE — Progress Notes (Signed)
Pt has been observed throughout the evening sitting in the dayroom near the TV with minimal interaction with his peers.  Pt reports he has had a good day.  He states his withdrawal symptoms are decreasing and he feels better today than he did yesterday.  He says he still has passive suicidal thoughts, but can contract for safety.  He denies HI/AVH.  He hopes to be able to go for rehab, but is not sure where he will be going.  He makes his needs known to staff.  Support and encouragement offered.  Pt is med compliant on the unit.  Discharge plans are in process.  Safety maintained with q15 minute checks.

## 2015-06-21 NOTE — Progress Notes (Signed)
D.  Pt pleasant on approach, denies complaints at this time.  Positive for evening AA group, interacting appropriately with peers on the unit.  Pt denies SI/HI/halluicnations at this time.  A.  Support and encouragement offered, medication given as ordered.  R.  Pt remains safe on the unit, will continue to monitor.

## 2015-06-21 NOTE — Progress Notes (Addendum)
Larry French admits that he is nervous and anxious about where he is going after here. He completed his daily assessment and on it he wrote he deneid SI and he rated his depression, hopelessness and anxiety " 5/5/8", respectively. A He is attending his groups and taking his medications as scheduled. R Safety is in place.

## 2015-06-21 NOTE — Progress Notes (Signed)
Pt reports that he has had a good day.  He denies SI/HI/AVH.  He says that his withdrawal symptoms are decreasing, but he is still feeling anxious.  He is observed sitting in the spot where he was yesterday, in front of the TV.  He says watching TV helps to distract him from worrying about things.  He wants to go to a long term treatment facility, hopefully at least a 6 months residential program.  Pt makes his needs known to staff.  He is pleasant and cooperative.  Support and encouragement offered.  Discharge plans are in process.  Safety maintained with q15 minute checks.
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
## 2015-06-21 NOTE — Progress Notes (Signed)
Patient did attend the evening speaker AA meeting.  

## 2015-06-21 NOTE — Progress Notes (Signed)
Larry French  06/21/2015 11:01 AM Larry French  MRN:  [id number] Subjective:  Patient reports  " I am anxious about placement after I  leave here.  I think that is causing my mood to fluctuate. The fear of the unknown"   Objective:Larry French is awake, alert and oriented X , found attending group session.  Denies suicidal or homicidal ideation. Denies auditory or visual hallucination and does not appear to be responding to internal stimuli. Patient reports  Interacting well with staff and others. Patient reports he is medication compliant without mediation side effects.  States his depression 2/10. Patient states "My mood is down because I don't qualify for placement at the 3 residential  facilities that I called on yesterday." Patient is ruminative about "poor care" that he received at [place] ([place]) reports that he was not happy there and that was the worst program. that this And expressed concerns that he doesn't want to go to a homeless shelter.  Reports his anxiety is 10/10 when he gets worried.  Reports good appetite and is resting well.  Support, encouragement and reassurance was provided.     Principal Problem: Alcohol abuse with alcohol-induced mental disorder [facility]) Diagnosis:   Patient Active Problem List   Diagnosis Date Noted  . Alcohol abuse with alcohol-induced mental disorder ([place]) [F10.988] 06/19/2015  . Benzodiazepine dependence ([place]) [F13.20] 04/25/2015  . Alcohol abuse with alcohol-induced mood disorder ([place]) [F10.14] 04/22/2015  . GAD (generalized anxiety disorder) [F41.1]   . Severe episode of recurrent major depressive disorder, without psychotic features ([place]) [F33.2]   . Alcohol abuse [F10.10] 04/17/2015  . Benzodiazepine abuse [F13.10] 04/17/2015  . Posttraumatic stress disorder [F43.10] 04/17/2015  . Suicidal ideation [R45.851] 04/17/2015  . Abnormal EKG [R94.31]   . Benzodiazepine withdrawal ([place]) [F13.239]   . NSTEMI (non-ST elevated  myocardial infarction) ([place]) [I21.4]   . Positive cardiac stress test [R94.39]   . Elevated troponin [R79.89]   . Withdrawal from benzodiazepine ([place]) [F13.239] 04/05/2015  . STEMI, RCA DES 05/28/10 [I21.3] 06/01/2011  . CAD, residual CFX LAD disease [I25.10] 06/01/2011  . Tobacco abuse [Z72.0] 05/29/2011  . History of ETOH abuse and Xanax abuse. In rehab pta [F10.10] 05/29/2011   Total Time spent with patient: 20 minutes  Past Psychiatric History: see admission H and P  Past Medical History:  Past Medical History  Diagnosis Date  . Alcohol abuse     Recovered x 15 months  . CA - skin cancer   . Mental disorder   . Headache(784.0)   . Anxiety   . Depression   . CAD, residual CFX LAD disease 06/01/2011    a. s/p inferior ST elevation MI s/p PCI/DES to RCA on 05/29/2011; b. residual LAD and LCx CAD tx'd on 06/02/11 cath with LAD CAD successfully tx'd w/ PCI/DES, LCx managaged medically   . Hypertension   . Hyperlipemia   . PTSD (post-traumatic stress disorder)   . Polysubstance abuse     a. etoh, xanax, tobacco  . MI (myocardial infarction) [facility])     Past Surgical History  Procedure Laterality Date  . Tonsillectomy and adenoidectomy    . Left heart cath Right 05/29/2011    Procedure: LEFT HEART CATH;  Surgeon: Leonie Man, MD;  Location: [facility] CATH LAB;  Service: Cardiovascular;  Laterality: Right;  . Left heart catheterization with coronary angiogram [name] 06/01/2011    Procedure: LEFT HEART CATHETERIZATION WITH CORONARY ANGIOGRAM;  Surgeon: Leonie Man, MD;  Location[facility]  CATH LAB;  Service: Cardiovascular;  Laterality: [name];  relook cath, possible PCI  . Cardiac catheterization Bilateral 04/09/2015    Procedure: Coronary Angiogram;  Surgeon: Wellington Hampshire, MD;  Location: [place] CV LAB;  Service: Cardiovascular;  Laterality: Bilateral;  . Cardiac catheterization [name] 04/09/2015    Procedure: Coronary Stent Intervention;  Surgeon: Wellington Hampshire, MD;  Location: [place] CV LAB;  Service: Cardiovascular;  Laterality: [name];   Family History:  Family History  Problem Relation Age of Onset  . Coronary artery disease Maternal Grandfather 20    Died   Family Psychiatric  History: see admission H and P Social History:  History  Alcohol Use  . Yes    Comment: this week     History  Drug Use No    Social History   Social History  . Marital Status: Single    Spouse Name: [name]  . Number of Children: 0  . Years of Education: [name]   Occupational History  . Student    Social History Main Topics  . Smoking status: Current Every Day Smoker -- 1.50 packs/day for 30 years    Types: Cigarettes  . Smokeless tobacco: None  . Alcohol Use: Yes     Comment: this week  . Drug Use: No  . Sexual Activity: No   Other Topics Concern  . None   Social History Narrative   Additional Social History:                    Sleep: Fair  Appetite:  Fair  Current Medications: Current Facility-Administered Medications  Medication Dose Route Frequency Provider Last Rate Last Dose  . alum & mag hydroxide-simeth (MAALOX/MYLANTA) 200-200-20 MG/5ML suspension 30 mL  30 mL Oral Q4H PRN Laverle Hobby, PA-C      . aspirin EC tablet 81 mg  81 mg Oral Daily Laverle Hobby, PA-C   81 mg at 06/21/15 0946  . clopidogrel (PLAVIX) tablet 75 mg  75 mg Oral Daily Laverle Hobby, PA-C   75 mg at 06/21/15 0945  . cyclobenzaprine (FLEXERIL) tablet 10 mg  10 mg Oral BID Laverle Hobby, PA-C   10 mg at 06/21/15 0946  . gabapentin (NEURONTIN) capsule 300 mg  300 mg Oral TID Laverle Hobby, PA-C   300 mg at 06/21/15 0945  . hydrOXYzine (ATARAX/VISTARIL) tablet 50 mg  50 mg Oral Q6H PRN Laverle Hobby, PA-C      . loperamide (IMODIUM) capsule 2-4 mg  2-4 mg Oral PRN Laverle Hobby, PA-C      . LORazepam (ATIVAN) tablet 1 mg  1 mg Oral Q6H PRN Laverle Hobby, PA-C   1 mg at 06/20/15 2103  . LORazepam (ATIVAN) tablet 1 mg  1 mg Oral TID Laverle Hobby, PA-C   1 mg at  06/21/15 0948   Followed by  . LORazepam (ATIVAN) tablet 1 mg  1 mg Oral BID Laverle Hobby, PA-C       Followed by  . [START ON 06/23/2015] LORazepam (ATIVAN) tablet 1 mg  1 mg Oral Daily Spencer E Simon, PA-C      . losartan (COZAAR) tablet 25 mg  25 mg Oral Daily Laverle Hobby, PA-C   25 mg at 06/21/15 0946  . magnesium hydroxide (MILK OF MAGNESIA) suspension 30 mL  30 mL Oral Daily PRN Laverle Hobby, PA-C      . metoprolol tartrate (LOPRESSOR) tablet 25 mg  25 mg Oral BID Laverle Hobby, PA-C   25 mg at 06/21/15 [id number]  . multivitamin with minerals tablet 1 tablet  1 tablet Oral Daily Laverle Hobby, PA-C   1 tablet at 06/21/15 0946  . nicotine (NICODERM CQ - dosed in mg/24 hours) patch 21 mg  21 mg Transdermal Daily Laverle Hobby, PA-C   21 mg at 06/21/15 0949  . nitroGLYCERIN (NITROSTAT) SL tablet 0.4 mg  0.4 mg Sublingual Q5 min PRN Laverle Hobby, PA-C      . ondansetron (ZOFRAN-ODT) disintegrating tablet 4 mg  4 mg Oral Q6H PRN Laverle Hobby, PA-C      . QUEtiapine (SEROQUEL) tablet 400 mg  400 mg Oral QHS Laverle Hobby, PA-C   400 mg at 06/20/15 2102  . simvastatin (ZOCOR) tablet 40 mg  40 mg Oral q morning - 10a Laverle Hobby, PA-C   40 mg at 06/20/15 1059  . thiamine (B-1) injection 100 mg  100 mg Intramuscular Once Laverle Hobby, PA-C   100 mg at 06/19/15 0127  . thiamine (VITAMIN B-1) tablet 100 mg  100 mg Oral Daily Laverle Hobby, PA-C   100 mg at 06/21/15 [id number]    Lab Results:  No results found for this or any previous visit (from the past 48 hour(s)).  Blood Alcohol level:  Lab Results  Component Value Date   ETH 34* 06/18/2015   ETH 118* 04/22/2015    Physical Findings: AIMS: Facial and Oral Movements Muscles of Facial Expression: None, normal Lips and Perioral Area: None, normal Jaw: None, normal Tongue: None, normal,Extremity Movements Upper (arms, wrists, hands, fingers): None, normal Lower (legs, knees, ankles, toes): None, normal, Trunk  Movements Neck, shoulders, hips: None, normal, Overall Severity Severity of abnormal movements (highest score from questions above): None, normal Incapacitation due to abnormal movements: None, normal Patient's awareness of abnormal movements (rate only patient's report): No Awareness, Dental Status Current problems with teeth and/or dentures?: No Does patient usually wear dentures?: Yes  CIWA:  CIWA-Ar Total: 1 COWS:     Musculoskeletal: Strength & Muscle Tone: within normal limits Gait & Station: normal Patient leans: normal  Psychiatric Specialty Exam: Review of Systems  Constitutional: Positive for malaise/fatigue.  HENT: Negative.   Eyes: Negative.   Respiratory: Negative.   Cardiovascular: Negative.   Gastrointestinal: Negative.   Musculoskeletal: Negative.   Skin: Negative.   Neurological: Positive for weakness.  Endo/Heme/Allergies: Negative.   Psychiatric/Behavioral: Positive for depression and substance abuse. The patient is nervous/anxious.   All other systems reviewed and are negative.   Blood pressure 117/92, pulse 107, temperature 98.2 F (36.8 C), temperature source Oral, resp. rate 16, height 5\' 8"  (1.727 m), weight 85.276 kg (188 lb), SpO2 98 %.Body mass index is 28.59 kg/(m^2).  General Appearance: Fairly Groomed,Pleasant, clam and cooperative   Engineer, water::  Fair  Speech:  Clear and Coherent  Volume:  fluctuates  Mood:  Anxious and worried  Affect:  anxious worried  Thought Process:  Coherent and Goal Directed  Orientation:  Full (Time, Place, and Person)  Thought Content:  symptoms events worries concerns  Suicidal Thoughts:  No  Homicidal Thoughts:  No  Memory:  Immediate;   Fair Recent;   Fair Remote;   Fair  Judgement:  Fair  Insight:  Present  Psychomotor Activity:  Restlessness  Concentration:  Fair  Recall:  [employer] of Knowledge:Fair  Language: Fair  Akathisia:  No  Handed:  Right  AIMS (if indicated):     Assets:  Desire for  Improvement  ADL's:  Intact  Cognition: WNL  Sleep:  Number of Hours: 6.25   I agree with current treatment plan on 06/21/2015, Patient seen face-to-face for psychiatric evaluation follow-up, chart reviewed. Reviewed the information documented and agree with the treatment plan.  Treatment Plan Summary:  Daily contact with patient to assess and evaluate symptoms and progress in treatment and Medication management   Supportive approach/coping skills Alcohol dependence; continue the Ativan detox protocol/work a relapse prevention plan Anxiety; continue the Neurontin/Vistaril (off label) Ruminative thinking; continue the Seroquel 400 mg HS Explore residential treatment options  Derrill [place], NP 06/21/2015, 11:01 AM Agree with NP Progress French, as above  Neita Garnet, MD

## 2015-06-22 MED ORDER — BUSPIRONE HCL 5 MG PO TABS
5.0000 mg | ORAL_TABLET | Freq: Two times a day (BID) | ORAL | Status: DC
  Administered 2015-06-22 – 2015-06-25 (×6): 5 mg via ORAL
  Filled 2015-06-22 (×10): qty 1

## 2015-06-22 MED ORDER — LORAZEPAM 1 MG PO TABS
1.0000 mg | ORAL_TABLET | Freq: Two times a day (BID) | ORAL | Status: AC
  Administered 2015-06-22: 1 mg via ORAL
  Filled 2015-06-22: qty 1

## 2015-06-22 NOTE — Progress Notes (Signed)
[facility] MD Progress Note  06/22/2015 11:25 AM Larry French  MRN:  [id number] Subjective:  Patient reports" I am thankful you woke me up, when you did, I was in the middle of a panic attack. I think". Patient is still endorses mood fluctuating with increased anxiety.    Objective:Larry French is awake, alert and oriented X , found resting in bedroom.  Denies suicidal or homicidal ideation. Denies auditory or visual hallucination and does not appear to be responding to internal stimuli. Patient reports interacting well with staff and others. Patient reports he is medication compliant without mediation side effects. States his depression 2/10. Patient states "my mood is still fluctuating and I am having increased anxiety."  Patient is still ruminative about "poor care" that he received at [place] ([place]) reports that he was not happy there and that was the worst program and reports that he has to find away to get his clothing from [place] in [place] after discharge".  Reports his anxiety is still 10/10 hand is having a difficult time using coping skills. Patient reports vistaril doesn't help with my anxiety only ativan.   Reports good appetite and is resting okay.  Support, encouragement and reassurance was provided.     Principal Problem: Alcohol abuse with alcohol-induced mental disorder [facility]) Diagnosis:   Patient Active Problem List   Diagnosis Date Noted  . Alcohol abuse with alcohol-induced mental disorder ([place]) [F10.988] 06/19/2015  . Benzodiazepine dependence ([place]) [F13.20] 04/25/2015  . Alcohol abuse with alcohol-induced mood disorder ([place]) [F10.14] 04/22/2015  . GAD (generalized anxiety disorder) [F41.1]   . Severe episode of recurrent major depressive disorder, without psychotic features ([place]) [F33.2]   . Alcohol abuse [F10.10] 04/17/2015  . Benzodiazepine abuse [F13.10] 04/17/2015  . Posttraumatic stress disorder [F43.10] 04/17/2015  . Suicidal ideation [R45.851] 04/17/2015  .  Abnormal EKG [R94.31]   . Benzodiazepine withdrawal ([place]) [F13.239]   . NSTEMI (non-ST elevated myocardial infarction) ([place]) [I21.4]   . Positive cardiac stress test [R94.39]   . Elevated troponin [R79.89]   . Withdrawal from benzodiazepine ([place]) [F13.239] 04/05/2015  . STEMI, RCA DES 05/28/10 [I21.3] 06/01/2011  . CAD, residual CFX LAD disease [I25.10] 06/01/2011  . Tobacco abuse [Z72.0] 05/29/2011  . History of ETOH abuse and Xanax abuse. In rehab pta [F10.10] 05/29/2011   Total Time spent with patient: 20 minutes  Past Psychiatric History: see admission H and P  Past Medical History:  Past Medical History  Diagnosis Date  . Alcohol abuse     Recovered x 15 months  . CA - skin cancer   . Mental disorder   . Headache(784.0)   . Anxiety   . Depression   . CAD, residual CFX LAD disease 06/01/2011    a. s/p inferior ST elevation MI s/p PCI/DES to RCA on 05/29/2011; b. residual LAD and LCx CAD tx'd on 06/02/11 cath with LAD CAD successfully tx'd w/ PCI/DES, LCx managaged medically   . Hypertension   . Hyperlipemia   . PTSD (post-traumatic stress disorder)   . Polysubstance abuse     a. etoh, xanax, tobacco  . MI (myocardial infarction) [facility])     Past Surgical History  Procedure Laterality Date  . Tonsillectomy and adenoidectomy    . Left heart cath Right 05/29/2011    Procedure: LEFT HEART CATH;  Surgeon: Leonie Man, MD;  Location: [facility] CATH LAB;  Service: Cardiovascular;  Laterality: Right;  . Left heart catheterization with coronary angiogram [name] 06/01/2011    Procedure: LEFT  HEART CATHETERIZATION WITH CORONARY ANGIOGRAM;  Surgeon: Leonie Man, MD;  Location: [facility] CATH LAB;  Service: Cardiovascular;  Laterality: [name];  relook cath, possible PCI  . Cardiac catheterization Bilateral 04/09/2015    Procedure: Coronary Angiogram;  Surgeon: Wellington Hampshire, MD;  Location: [place] CV LAB;  Service: Cardiovascular;  Laterality: Bilateral;  . Cardiac catheterization [name] 04/09/2015     Procedure: Coronary Stent Intervention;  Surgeon: Wellington Hampshire, MD;  Location: [place] CV LAB;  Service: Cardiovascular;  Laterality: [name];   Family History:  Family History  Problem Relation Age of Onset  . Coronary artery disease Maternal Grandfather 34    Died   Family Psychiatric  History: see admission H and P Social History:  History  Alcohol Use  . Yes    Comment: this week     History  Drug Use No    Social History   Social History  . Marital Status: Single    Spouse Name: [name]  . Number of Children: 0  . Years of Education: [name]   Occupational History  . Student    Social History Main Topics  . Smoking status: Current Every Day Smoker -- 1.50 packs/day for 30 years    Types: Cigarettes  . Smokeless tobacco: None  . Alcohol Use: Yes     Comment: this week  . Drug Use: No  . Sexual Activity: No   Other Topics Concern  . None   Social History Narrative   Additional Social History:                    Sleep: Fair  Appetite:  Fair  Current Medications: Current Facility-Administered Medications  Medication Dose Route Frequency Provider Last Rate Last Dose  . alum & mag hydroxide-simeth (MAALOX/MYLANTA) 200-200-20 MG/5ML suspension 30 mL  30 mL Oral Q4H PRN Laverle Hobby, PA-C      . aspirin EC tablet 81 mg  81 mg Oral Daily Laverle Hobby, PA-C   81 mg at 06/22/15 0742  . clopidogrel (PLAVIX) tablet 75 mg  75 mg Oral Daily Laverle Hobby, PA-C   75 mg at 06/22/15 0743  . cyclobenzaprine (FLEXERIL) tablet 10 mg  10 mg Oral BID Laverle Hobby, PA-C   10 mg at 06/22/15 0743  . gabapentin (NEURONTIN) capsule 300 mg  300 mg Oral TID Laverle Hobby, PA-C   300 mg at 06/22/15 0743  . hydrOXYzine (ATARAX/VISTARIL) tablet 50 mg  50 mg Oral Q6H PRN Laverle Hobby, PA-C   50 mg at 06/22/15 1007  . [START ON 06/23/2015] LORazepam (ATIVAN) tablet 1 mg  1 mg Oral Daily Spencer E Simon, PA-C      . losartan (COZAAR) tablet 25 mg  25 mg Oral Daily  Laverle Hobby, PA-C   25 mg at 06/22/15 [id number]  . magnesium hydroxide (MILK OF MAGNESIA) suspension 30 mL  30 mL Oral Daily PRN Laverle Hobby, PA-C      . metoprolol tartrate (LOPRESSOR) tablet 25 mg  25 mg Oral BID Laverle Hobby, PA-C   25 mg at 06/22/15 0743  . multivitamin with minerals tablet 1 tablet  1 tablet Oral Daily Laverle Hobby, PA-C   1 tablet at 06/22/15 0743  . nicotine (NICODERM CQ - dosed in mg/24 hours) patch 21 mg  21 mg Transdermal Daily Laverle Hobby, PA-C   21 mg at 06/22/15 0744  . nitroGLYCERIN (NITROSTAT) SL tablet 0.4 mg  0.4 mg Sublingual Q5 min PRN Laverle Hobby, PA-C      . QUEtiapine (SEROQUEL) tablet 400 mg  400 mg Oral QHS Laverle Hobby, PA-C   400 mg at 06/21/15 2117  . simvastatin (ZOCOR) tablet 40 mg  40 mg Oral q morning - 10a Laverle Hobby, PA-C   40 mg at 06/22/15 0743  . thiamine (B-1) injection 100 mg  100 mg Intramuscular Once Laverle Hobby, PA-C   100 mg at 06/19/15 0127  . thiamine (VITAMIN B-1) tablet 100 mg  100 mg Oral Daily Laverle Hobby, PA-C   100 mg at 06/22/15 [id number]    Lab Results:  No results found for this or any previous visit (from the past 48 hour(s)).  Blood Alcohol level:  Lab Results  Component Value Date   ETH 34* 06/18/2015   ETH 118* 04/22/2015    Physical Findings: AIMS: Facial and Oral Movements Muscles of Facial Expression: None, normal Lips and Perioral Area: None, normal Jaw: None, normal Tongue: None, normal,Extremity Movements Upper (arms, wrists, hands, fingers): None, normal Lower (legs, knees, ankles, toes): None, normal, Trunk Movements Neck, shoulders, hips: None, normal, Overall Severity Severity of abnormal movements (highest score from questions above): None, normal Incapacitation due to abnormal movements: None, normal Patient's awareness of abnormal movements (rate only patient's report): No Awareness, Dental Status Current problems with teeth and/or dentures?: No Does patient usually  wear dentures?: Yes  CIWA:  CIWA-Ar Total: 0 COWS:     Musculoskeletal: Strength & Muscle Tone: within normal limits Gait & Station: normal Patient leans: normal  Psychiatric Specialty Exam: Review of Systems  Constitutional: Negative for malaise/fatigue.  HENT: Negative.   Eyes: Negative.   Respiratory: Negative.   Cardiovascular: Negative.   Gastrointestinal: Negative.   Musculoskeletal: Negative.   Skin: Negative.   Neurological: Negative for weakness.  Endo/Heme/Allergies: Negative.   Psychiatric/Behavioral: Positive for depression and substance abuse. The patient is nervous/anxious.   All other systems reviewed and are negative.   Blood pressure 127/105, pulse 105, temperature 98.1 F (36.7 C), temperature source Oral, resp. rate 20, height 5\' 8"  (1.727 m), weight 85.276 kg (188 lb), SpO2 98 %.Body mass index is 28.59 kg/(m^2).  General Appearance: Fairly Groomed,Pleasant, clam and cooperative   Engineer, water::  Fair  Speech:  Clear and Coherent  Volume:  fluctuates  Mood:  Anxious and worried 10/10 anxiety   Affect:  anxious worried  Thought Process:  Coherent and Goal Directed  Orientation:  Full (Time, Place, and Person)  Thought Content:  symptoms events worries concerns  Suicidal Thoughts:  No  Homicidal Thoughts:  No  Memory:  Immediate;   Fair Recent;   Fair Remote;   Fair  Judgement:  Fair  Insight:  Present  Psychomotor Activity:  Restlessness  Concentration:  Fair  Recall:  [employer] of Knowledge:Fair  Language: Fair  Akathisia:  No  Handed:  Right  AIMS (if indicated):     Assets:  Desire for Improvement  ADL's:  Intact  Cognition: WNL  Sleep:  Number of Hours: 6.5   I agree with current treatment plan on 06/22/2015, Patient seen face-to-face for psychiatric evaluation follow-up, chart reviewed. Reviewed the information documented and agree with the treatment plan.  Treatment Plan Summary:  Daily contact with patient to assess and evaluate  symptoms and progress in treatment and Medication management  Start Buspar 5 mg Po BID for anxiety/mood stabilization  Supportive approach/coping skills Alcohol dependence; continue the Ativan  detox protocol/work a relapse prevention plan Anxiety; continue the Neurontin/Vistaril (off label) Ruminative thinking; continue the Seroquel 400 mg HS Explore residential treatment options  D[place]ill [place], NP 06/22/2015, 11:25 AM  Agree with NP Progress Note, as above  Neita Garnet, MD

## 2015-06-22 NOTE — Progress Notes (Signed)
D.  Pt pleasant on approach, denies complaints at this time.  Positive for evening AA group, interacting appropriately with peers on the unit.  Denies SI/HI/hallucinatons at this time.  A.  Support and encouragement offered, medication given as ordered  R.  Pt remains safe on the unit, will continue to monitor.

## 2015-06-22 NOTE — [facility] Group Notes (Signed)
[facility] Group Notes:  (Clinical Social Work)  06/22/2015  10:00-11:00AM  Summary of Progress/Problems:   The main focus of today's process group was to   1)  discuss the importance of adding supports  2)  define health supports versus unhealthy supports  3)  identify the patient's current unhealthy supports and plan how to handle them  4)  Identify the patient's current healthy supports and plan what to add.  An emphasis was placed on using counselor, doctor, therapy groups, 12-step groups, and problem-specific support groups to expand supports.    The patient expressed full comprehension of the concepts presented, and agreed that there is a need to add more supports.  The patient stated that right now he has no supports in his life, because his parents have "basically disowned me."  He knows that they love him, and eventually was able to report that the change in their attitude toward him came on 07/04/14 when his brother was killed in a car accident.  He stated he loves the idea of a faith-based rehab.  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  Type of Therapy:  Process Group with Motivational Interviewing  Participation Level:  Active  Participation Quality:  Attentive, Sharing and Supportive  Affect:  Depressed  Cognitive:  Appropriate and Oriented  Insight:  Developing/Improving  Engagement in Therapy:  Engaged  Modes of Intervention:   Education, Support and Processing, Activity  Selmer Dominion, LCSW 06/22/2015

## 2015-06-22 NOTE — Plan of Care (Signed)
Problem: Alteration in mood & ability to function due to Goal: STG-Patient will attend groups Outcome: Progressing Pt has attended AA groups this weekend     

## 2015-06-22 NOTE — Progress Notes (Addendum)
Larry French cont to struggle with finding the balance...between being in the hospital and " letting go" of his feelings of guilt and depression. He is attending his groups and he is engaged in his recovery as evidenced by his participation in his groups, ie identifying his unhealthy behaviors as well as his healthy support systems. A He completed his daily assessment and on it he wrote  He denied SI today and he rated his depression, hopelessness and anxeity " 6/6//8", respectively. R Safety is in place and poc cont.

## 2015-06-23 MED ORDER — QUETIAPINE FUMARATE 50 MG PO TABS
50.0000 mg | ORAL_TABLET | Freq: Two times a day (BID) | ORAL | Status: DC | PRN
  Administered 2015-06-23 – 2015-06-24 (×2): 50 mg via ORAL
  Filled 2015-06-23 (×2): qty 1

## 2015-06-23 NOTE — Progress Notes (Signed)
Patient did attend the evening speaker AA meeting.  

## 2015-06-23 NOTE — Progress Notes (Signed)
[facility] MD Progress Note  06/23/2015 2:19 PM Larry French  MRN:  [id number]   Subjective:  Patient reports" I am feeling okay today, just having some back spasm"  [place] is awake, alert and oriented X4 , found resting in his bedroom.  Denies suicidal or homicidal ideation. Denies auditory or visual hallucination and does not appear to be responding to internal stimuli. Patient reports interacting well with staff and others. Patient reports he is medication compliant without mediation side effects. States his depression is still  2/10. Patient states "I am feeling okay, I am just having lower back spams."  Patient is still ruminative about "poor care" that he received at [facility] Reports his anxiety is 8/10. and is having a difficult time using coping skills. Reports that he hopeful for a better outcome with this new treatment facility. Reports good appetite and is resting okay.  Support, encouragement and reassurance was provided.     Principal Problem: Alcohol abuse with alcohol-induced mental disorder [facility]) Diagnosis:   Patient Active Problem List   Diagnosis Date Noted  . Alcohol abuse with alcohol-induced mental disorder ([place]) [F10.988] 06/19/2015  . Benzodiazepine dependence ([place]) [F13.20] 04/25/2015  . Alcohol abuse with alcohol-induced mood disorder ([place]) [F10.14] 04/22/2015  . GAD (generalized anxiety disorder) [F41.1]   . Severe episode of recurrent major depressive disorder, without psychotic features ([place]) [F33.2]   . Alcohol abuse [F10.10] 04/17/2015  . Benzodiazepine abuse [F13.10] 04/17/2015  . Posttraumatic stress disorder [F43.10] 04/17/2015  . Suicidal ideation [R45.851] 04/17/2015  . Abnormal EKG [R94.31]   . Benzodiazepine withdrawal ([place]) [F13.239]   . NSTEMI (non-ST elevated myocardial infarction) ([place]) [I21.4]   . Positive cardiac stress test [R94.39]   . Elevated troponin [R79.89]   . Withdrawal from benzodiazepine ([place]) [F13.239]  04/05/2015  . STEMI, RCA DES 05/28/10 [I21.3] 06/01/2011  . CAD, residual CFX LAD disease [I25.10] 06/01/2011  . Tobacco abuse [Z72.0] 05/29/2011  . History of ETOH abuse and Xanax abuse. In rehab pta [F10.10] 05/29/2011   Total Time spent with patient: 20 minutes  Past Psychiatric History: see admission H and P  Past Medical History:  Past Medical History  Diagnosis Date  . Alcohol abuse     Recovered x 15 months  . CA - skin cancer   . Mental disorder   . Headache(784.0)   . Anxiety   . Depression   . CAD, residual CFX LAD disease 06/01/2011    a. s/p inferior ST elevation MI s/p PCI/DES to RCA on 05/29/2011; b. residual LAD and LCx CAD tx'd on 06/02/11 cath with LAD CAD successfully tx'd w/ PCI/DES, LCx managaged medically   . Hypertension   . Hyperlipemia   . PTSD (post-traumatic stress disorder)   . Polysubstance abuse     a. etoh, xanax, tobacco  . MI (myocardial infarction) [facility])     Past Surgical History  Procedure Laterality Date  . Tonsillectomy and adenoidectomy    . Left heart cath Right 05/29/2011    Procedure: LEFT HEART CATH;  Surgeon: Leonie Man, MD;  Location: [facility] CATH LAB;  Service: Cardiovascular;  Laterality: Right;  . Left heart catheterization with coronary angiogram [name] 06/01/2011    Procedure: LEFT HEART CATHETERIZATION WITH CORONARY ANGIOGRAM;  Surgeon: Leonie Man, MD;  Location: [facility] CATH LAB;  Service: Cardiovascular;  Laterality: [name];  relook cath, possible PCI  . Cardiac catheterization Bilateral 04/09/2015    Procedure: Coronary Angiogram;  Surgeon: Wellington Hampshire, MD;  Location: [facility]  INVASIVE CV LAB;  Service: Cardiovascular;  Laterality: Bilateral;  . Cardiac catheterization [name] 04/09/2015    Procedure: Coronary Stent Intervention;  Surgeon: Wellington Hampshire, MD;  Location: [facility] CV LAB;  Service: Cardiovascular;  Laterality: [name];   Family History:  Family History  Problem Relation Age of Onset  . Coronary artery disease Maternal  Grandfather 70    Died   Family Psychiatric  History: see admission H and P Social History:  History  Alcohol Use  . Yes    Comment: this week     History  Drug Use No    Social History   Social History  . Marital Status: Single    Spouse Name: [name]  . Number of Children: 0  . Years of Education: [name]   Occupational History  . Student    Social History Main Topics  . Smoking status: Current Every Day Smoker -- 1.50 packs/day for 30 years    Types: Cigarettes  . Smokeless tobacco: None  . Alcohol Use: Yes     Comment: this week  . Drug Use: No  . Sexual Activity: No   Other Topics Concern  . None   Social History Narrative   Additional Social History:                    Sleep: Fair  Appetite:  Fair  Current Medications: Current Facility-Administered Medications  Medication Dose Route Frequency Provider Last Rate Last Dose  . alum & mag hydroxide-simeth (MAALOX/MYLANTA) 200-200-20 MG/5ML suspension 30 mL  30 mL Oral Q4H PRN Laverle Hobby, PA-C      . aspirin EC tablet 81 mg  81 mg Oral Daily Laverle Hobby, PA-C   81 mg at 06/23/15 0758  . busPIRone (BUSPAR) tablet 5 mg  5 mg Oral BID Derrill Center, NP   5 mg at 06/23/15 0759  . clopidogrel (PLAVIX) tablet 75 mg  75 mg Oral Daily Laverle Hobby, PA-C   75 mg at 06/23/15 0759  . cyclobenzaprine (FLEXERIL) tablet 10 mg  10 mg Oral BID Laverle Hobby, PA-C   10 mg at 06/23/15 0758  . gabapentin (NEURONTIN) capsule 300 mg  300 mg Oral TID Laverle Hobby, PA-C   300 mg at 06/23/15 1157  . hydrOXYzine (ATARAX/VISTARIL) tablet 50 mg  50 mg Oral Q6H PRN Laverle Hobby, PA-C   50 mg at 06/22/15 1007  . losartan (COZAAR) tablet 25 mg  25 mg Oral Daily Laverle Hobby, PA-C   25 mg at 06/23/15 0759  . magnesium hydroxide (MILK OF MAGNESIA) suspension 30 mL  30 mL Oral Daily PRN Laverle Hobby, PA-C      . metoprolol tartrate (LOPRESSOR) tablet 25 mg  25 mg Oral BID Laverle Hobby, PA-C   25 mg at 06/23/15  0759  . multivitamin with minerals tablet 1 tablet  1 tablet Oral Daily Laverle Hobby, PA-C   1 tablet at 06/23/15 0759  . nicotine (NICODERM CQ - dosed in mg/24 hours) patch 21 mg  21 mg Transdermal Daily Laverle Hobby, PA-C   21 mg at 06/23/15 0800  . nitroGLYCERIN (NITROSTAT) SL tablet 0.4 mg  0.4 mg Sublingual Q5 min PRN Laverle Hobby, PA-C      . QUEtiapine (SEROQUEL) tablet 400 mg  400 mg Oral QHS Laverle Hobby, PA-C   400 mg at 06/22/15 2159  . simvastatin (ZOCOR) tablet 40 mg  40 mg Oral  q morning - 10a Laverle Hobby, PA-C   40 mg at 06/23/15 1017  . thiamine (B-1) injection 100 mg  100 mg Intramuscular Once Laverle Hobby, PA-C   100 mg at 06/19/15 0127  . thiamine (VITAMIN B-1) tablet 100 mg  100 mg Oral Daily Laverle Hobby, PA-C   100 mg at 06/23/15 [id number]    Lab Results:  No results found for this or any previous visit (from the past 48 hour(s)).  Blood Alcohol level:  Lab Results  Component Value Date   ETH 34* 06/18/2015   ETH 118* 04/22/2015    Physical Findings: AIMS: Facial and Oral Movements Muscles of Facial Expression: None, normal Lips and Perioral Area: None, normal Jaw: None, normal Tongue: None, normal,Extremity Movements Upper (arms, wrists, hands, fingers): None, normal Lower (legs, knees, ankles, toes): None, normal, Trunk Movements Neck, shoulders, hips: None, normal, Overall Severity Severity of abnormal movements (highest score from questions above): None, normal Incapacitation due to abnormal movements: None, normal Patient's awareness of abnormal movements (rate only patient's report): No Awareness, Dental Status Current problems with teeth and/or dentures?: No Does patient usually wear dentures?: Yes  CIWA:  CIWA-Ar Total: 1 COWS:  COWS Total Score: 3  Musculoskeletal: Strength & Muscle Tone: within normal limits Gait & Station: normal Patient leans: normal  Psychiatric Specialty Exam: Review of Systems  Constitutional: Negative  for malaise/fatigue.  HENT: Negative.   Eyes: Negative.   Respiratory: Negative.   Cardiovascular: Negative.   Gastrointestinal: Negative.   Musculoskeletal: Negative.   Skin: Negative.   Neurological: Negative for weakness.  Endo/Heme/Allergies: Negative.   Psychiatric/Behavioral: Positive for depression and substance abuse. Negative for suicidal ideas and hallucinations. The patient is nervous/anxious.   All other systems reviewed and are negative.   Blood pressure 129/83, pulse 106, temperature 98.1 F (36.7 C), temperature source Oral, resp. rate 16, height 5\' 8"  (1.727 m), weight 85.276 kg (188 lb), SpO2 98 %.Body mass index is 28.59 kg/(m^2).  General Appearance: Casual,Pleasant, clam and cooperative   Eye Contact::  Fair  Speech:  Clear and Coherent  Volume:  fluctuates  Mood:  Anxious and worried 8/10 anxiety   Affect:  anxious worried  Thought Process:  Coherent and Goal Directed  Orientation:  Full (Time, Place, and Person)  Thought Content:  symptoms events worries concerns  Suicidal Thoughts:  No  Homicidal Thoughts:  No  Memory:  Immediate;   Fair Recent;   Fair Remote;   Fair  Judgement:  Fair  Insight:  Present  Psychomotor Activity:  Restlessness-improving   Concentration:  Fair  Recall:  [employer] of Knowledge:Fair  Language: Fair  Akathisia:  No  Handed:  Right  AIMS (if indicated):     Assets:  Desire for Improvement Resilience  ADL's:  Intact  Cognition: WNL  Sleep:  Number of Hours: 6.5   I agree with current treatment plan on 06/23/2015, Patient seen face-to-face for psychiatric evaluation follow-up, chart reviewed and discussed with MD Eappen. Reviewed the information documented and agree with the treatment plan.  Treatment Plan Summary:  Daily contact with patient to assess and evaluate symptoms and progress in treatment and Medication management  Continue Buspar 5 mg Po BID for anxiety/mood stabilization  Start Seroquel 50 mg PO PRN for  increased anxiety Continue Neurontin 300 mg PO TID for mood stabilization Supportive approach/coping skills Alcohol dependence; continue the Ativan detox protocol/work a relapse prevention plan Anxiety; continue the Neurontin/Vistaril (off label) Ruminative thinking; continue the  Seroquel 400 mg QHS Explore residential treatment options CLSW working on dispostion to [place], NP 06/23/2015, 2:19 PM

## 2015-06-23 NOTE — BHH Group Notes (Signed)
[facility] LCSW Aftercare Discharge Planning Group Note   06/23/2015 10:40 AM  Participation Quality:  Appropriate   Mood/Affect:  Appropriate  Depression Rating:  8  Anxiety Rating:  8  Thoughts of Suicide:  No Will you contract for safety?   NA  Current AVH:  No  Plan for Discharge/Comments:  Pt reports that he cannot go to [facility] because of medication restrictions. Pt interested in [facility], [facility] and [facility]. He reports that his family "disowned me." Pt denies withdrawals. Reports poor sleep.   Transportation Means: unknown at this time.   Supports: none identified by pt.   Smart, Larry Mcguirt  LCSW .

## 2015-06-23 NOTE — Progress Notes (Signed)
D:  Patient's self inventory sheet, patient has poor sleep, sleep medication is not helpful.  Fair appetite, low energy level, poor concentration.  Rated depression and hopeless #7, anxiety #10.  Denied withdrawals.  Denied SI and HI, contracts for safety.  Denied A/V hallucinations.  Physical pain, back, medication does help pain.  Goal is to discharge soon. A:  Medications administered per MD orders.  Emotional support and encouragement given patient. R:  Denied SI and HI.  Denied A/V hallucinations. Safety maintained with 15 minute checks.

## 2015-06-23 NOTE — Progress Notes (Signed)
CSW left message for Juliann Pulse at [facility] to check bed availability/waitlist. CSW spoke with Cher Nakai at [facility] week or longer waitlist at this time for men. Pt declined referral at [facility] because facility does not allow seroquel. Pt interested in [facility] referral as well. [facility] referral faxed this morning. Pt is not eligible for [facility] due to recent admission. He reports no current family supports; no income; and is homeless.   Maxie Better, MSW, LCSW Clinical Social Worker 06/23/2015 10:51 AM

## 2015-06-23 NOTE — Progress Notes (Signed)
Recreation Therapy Notes  Date: 03.06.2017 Time: 9:30am Location: [address] Group Room   Group Topic: Stress Management  Goal Area(s) Addresses:  Patient will actively participate in stress management techniques presented during session.   Behavioral Response: Did not attend.   Laureen Ochs Klynn Linnemann, LRT/CTRS        Elizardo Chilson L 06/23/2015 2:09 PM

## 2015-06-23 NOTE — Plan of Care (Signed)
Problem: Consults Goal: Depression Patient Education See Patient Education Module for education specifics.  Outcome: Progressing Nurse discussed depression/coping skills with patient.        

## 2015-06-23 NOTE — B[facility] Group Notes (Signed)
Pt attended [place] meeting.  Victorino Sparrow, MHT

## 2015-06-23 NOTE — Progress Notes (Signed)
[facility] referral faxed this afternoon. CSW also spoke with Rob Merchandiser, retail) at [facility]. 2 male beds open; however intake coordinator is out of the office until next Monday (3/16). Also, there is a $300 admission fee. Referral faxed to Rob.  Maxie Better, MSW, LCSW Clinical Social Worker 06/23/2015 3:53 PM

## 2015-06-23 NOTE — BHH Group Notes (Signed)
[facility] LCSW Group Therapy  06/23/2015 1:16 PM  Type of Therapy:  Group Therapy  Participation Level:  Active  Participation Quality:  Attentive  Affect:  Appropriate  Cognitive:  Alert and Oriented  Insight:  Improving  Engagement in Therapy:  Engaged  Modes of Intervention:  Confrontation, Discussion, Education, Exploration, Problem-solving, Rapport Building, Socialization and Support  Summary of Progress/Problems: Today's Topic: Overcoming Obstacles. Patients identified one short term goal and potential obstacles in reaching this goal. Patients processed barriers involved in overcoming these obstacles. Patients identified steps necessary for overcoming these obstacles and explored motivation (internal and external) for facing these difficulties head on. Larry French was attentive and engaged during today's processing group. He shared that his biggest obstacle is "Starting over with nothing and no family support." Larry French shared that he is hoping to get into a treatment program or [facility] and is anxious about waiting for a bed. Larry French was able to problem solve with CSW and others in the group and continues to show progress with improving insight.   Smart, Natalin Bible LCSW 06/23/2015, 1:16 PM

## 2015-06-24 MED ORDER — QUETIAPINE FUMARATE 50 MG PO TABS
50.0000 mg | ORAL_TABLET | Freq: Three times a day (TID) | ORAL | Status: DC | PRN
  Administered 2015-06-24 – 2015-06-25 (×3): 50 mg via ORAL
  Filled 2015-06-24 (×3): qty 1

## 2015-06-24 NOTE — Plan of Care (Signed)
Problem: Consults Goal: Suicide Risk Patient Education (See Patient Education module for education specifics)  Outcome: Progressing Nurse discussed suicide thoughts/depression/anxiety/coping skills with patient.

## 2015-06-24 NOTE — Clinical Social Work Note (Signed)
Patient has a Automotive engineer, Abby Potash.  Edwyna Shell, LCSW Lead Clinical Social Worker Phone:  [phone]

## 2015-06-24 NOTE — Progress Notes (Signed)
Pt attend AA meeting this evening.

## 2015-06-24 NOTE — BHH Group Notes (Signed)
[facility] LCSW Group Therapy  06/24/2015 1:29 PM  Type of Therapy:  Group Therapy  Participation Level:  Did Not Attend-pt invited. Chose to remain in bed.  Summary of Progress/Problems: [facility] Speaker came to talk about his personal journey with substance abuse and addiction. The pt processed ways by which to relate to the speaker. [place] speaker provided handouts and educational information pertaining to groups and services offered by the [facility].   Smart, Vonna Brabson LCSW 06/24/2015, 1:29 PM

## 2015-06-24 NOTE — Progress Notes (Signed)
Recreation Therapy Notes  Animal-Assisted Activity (AAA) Program Checklist/Progress Notes Patient Eligibility Criteria Checklist & Daily Group note for Rec Tx Intervention  Date: 03.07.2017 Time: 2:45pm Location: [address]   AAA/T Program Assumption of Risk Form signed by Patient/ or Parent Legal Guardian yes  Patient is free of allergies or sever asthma yes  Patient reports no fear of animals yes  Patient reports no history of cruelty to animals yes  Patient understands his/her participation is voluntary yes  Behavioral Response: Did not attend.    Laureen Ochs Salomon Ganser, LRT/CTRS        Loralye Loberg L 06/24/2015 3:02 PM

## 2015-06-24 NOTE — Progress Notes (Signed)
Patient ID: Larry French, male   DOB: November 02, 1960, 55 y.o.   MRN: [id number] [facility] MD Progress Note  06/24/2015 4:06 PM Larry French  MRN:  [id number]   Subjective:  Larry French reports" I feel Blaah. All my bodies I met here left yesterday after discharge. Today, we have all these new girls that have a lot of issues. That makes my anxiety high. That is why I'm keeping to myself away from them. I'm doing well on my medicines. I'm trying to get into [facility]. I need it because I'm homeless"  Objective:Larry French is awake, alert and oriented X4, found resting in dayroom.  Denies suicidal or homicidal ideation. Denies auditory or visual hallucination and does not appear to be responding to internal stimuli. Patient reports interacting well with staff and others. Patient reports he is medication compliant without mediation side effects. States he feel blaah because he missed his bodies that were discharged yesterday.  Patient is still ruminative about "poor care" that he received at [place] ([facility] Reports his anxiety is so so. He is hoping to get into [facility] for further treatment. Reports good appetite and is resting okay.  Support, encouragement and reassurance was provided.    Principal Problem: Alcohol abuse with alcohol-induced mental disorder [facility]) Diagnosis:   Patient Active Problem List   Diagnosis Date Noted  . Alcohol abuse with alcohol-induced mental disorder ([place]) [F10.988] 06/19/2015  . Benzodiazepine dependence ([place]) [F13.20] 04/25/2015  . Alcohol abuse with alcohol-induced mood disorder ([place]) [F10.14] 04/22/2015  . GAD (generalized anxiety disorder) [F41.1]   . Severe episode of recurrent major depressive disorder, without psychotic features ([place]) [F33.2]   . Alcohol abuse [F10.10] 04/17/2015  . Benzodiazepine abuse [F13.10] 04/17/2015  . Posttraumatic stress disorder [F43.10] 04/17/2015  . Suicidal ideation [R45.851] 04/17/2015  . Abnormal EKG [R94.31]   . Benzodiazepine  withdrawal ([place]) [F13.239]   . NSTEMI (non-ST elevated myocardial infarction) ([place]) [I21.4]   . Positive cardiac stress test [R94.39]   . Elevated troponin [R79.89]   . Withdrawal from benzodiazepine ([place]) [F13.239] 04/05/2015  . STEMI, RCA DES 05/28/10 [I21.3] 06/01/2011  . CAD, residual CFX LAD disease [I25.10] 06/01/2011  . Tobacco abuse [Z72.0] 05/29/2011  . History of ETOH abuse and Xanax abuse. In rehab pta [F10.10] 05/29/2011   Total Time spent with patient: 15 minutes  Past Psychiatric History: see admission H and P  Past Medical History:  Past Medical History  Diagnosis Date  . Alcohol abuse     Recovered x 15 months  . CA - skin cancer   . Mental disorder   . Headache(784.0)   . Anxiety   . Depression   . CAD, residual CFX LAD disease 06/01/2011    a. s/p inferior ST elevation MI s/p PCI/DES to RCA on 05/29/2011; b. residual LAD and LCx CAD tx'd on 06/02/11 cath with LAD CAD successfully tx'd w/ PCI/DES, LCx managaged medically   . Hypertension   . Hyperlipemia   . PTSD (post-traumatic stress disorder)   . Polysubstance abuse     a. etoh, xanax, tobacco  . MI (myocardial infarction) [facility])     Past Surgical History  Procedure Laterality Date  . Tonsillectomy and adenoidectomy    . Left heart cath Right 05/29/2011    Procedure: LEFT HEART CATH;  Surgeon: Leonie Man, MD;  Location: [facility] CATH LAB;  Service: Cardiovascular;  Laterality: Right;  . Left heart catheterization with coronary angiogram [name] 06/01/2011    Procedure: LEFT HEART CATHETERIZATION WITH CORONARY ANGIOGRAM;  Surgeon: Leonie Man, MD;  Location: [facility] CATH LAB;  Service: Cardiovascular;  Laterality: [name];  relook cath, possible PCI  . Cardiac catheterization Bilateral 04/09/2015    Procedure: Coronary Angiogram;  Surgeon: Wellington Hampshire, MD;  Location: [place] CV LAB;  Service: Cardiovascular;  Laterality: Bilateral;  . Cardiac catheterization [name] 04/09/2015    Procedure: Coronary Stent  Intervention;  Surgeon: Wellington Hampshire, MD;  Location: [place] CV LAB;  Service: Cardiovascular;  Laterality: [name];   Family History:  Family History  Problem Relation Age of Onset  . Coronary artery disease Maternal Grandfather 33    Died   Family Psychiatric  History: see admission H and P Social History:  History  Alcohol Use  . Yes    Comment: this week     History  Drug Use No    Social History   Social History  . Marital Status: Single    Spouse Name: [name]  . Number of Children: 0  . Years of Education: [name]   Occupational History  . Student    Social History Main Topics  . Smoking status: Current Every Day Smoker -- 1.50 packs/day for 30 years    Types: Cigarettes  . Smokeless tobacco: None  . Alcohol Use: Yes     Comment: this week  . Drug Use: No  . Sexual Activity: No   Other Topics Concern  . None   Social History Narrative   Additional Social History:   Sleep: Good  Appetite:  Fair  Current Medications: Current Facility-Administered Medications  Medication Dose Route Frequency Provider Last Rate Last Dose  . alum & mag hydroxide-simeth (MAALOX/MYLANTA) 200-200-20 MG/5ML suspension 30 mL  30 mL Oral Q4H PRN Laverle Hobby, PA-C      . aspirin EC tablet 81 mg  81 mg Oral Daily Laverle Hobby, PA-C   81 mg at 06/24/15 0745  . busPIRone (BUSPAR) tablet 5 mg  5 mg Oral BID Derrill Center, NP   5 mg at 06/24/15 0745  . clopidogrel (PLAVIX) tablet 75 mg  75 mg Oral Daily Laverle Hobby, PA-C   75 mg at 06/24/15 0744  . cyclobenzaprine (FLEXERIL) tablet 10 mg  10 mg Oral BID Laverle Hobby, PA-C   10 mg at 06/24/15 0745  . gabapentin (NEURONTIN) capsule 300 mg  300 mg Oral TID Laverle Hobby, PA-C   300 mg at 06/24/15 1359  . hydrOXYzine (ATARAX/VISTARIL) tablet 50 mg  50 mg Oral Q6H PRN Laverle Hobby, PA-C   50 mg at 06/24/15 1100  . losartan (COZAAR) tablet 25 mg  25 mg Oral Daily Laverle Hobby, PA-C   25 mg at 06/24/15 0744  . magnesium  hydroxide (MILK OF MAGNESIA) suspension 30 mL  30 mL Oral Daily PRN Laverle Hobby, PA-C      . metoprolol tartrate (LOPRESSOR) tablet 25 mg  25 mg Oral BID Laverle Hobby, PA-C   25 mg at 06/24/15 0744  . multivitamin with minerals tablet 1 tablet  1 tablet Oral Daily Laverle Hobby, PA-C   1 tablet at 06/24/15 0744  . nicotine (NICODERM CQ - dosed in mg/24 hours) patch 21 mg  21 mg Transdermal Daily Laverle Hobby, PA-C   21 mg at 06/24/15 0743  . nitroGLYCERIN (NITROSTAT) SL tablet 0.4 mg  0.4 mg Sublingual Q5 min PRN Laverle Hobby, PA-C      . QUEtiapine (SEROQUEL) tablet 400 mg  400  mg Oral QHS Laverle Hobby, PA-C   400 mg at 06/23/15 2149  . QUEtiapine (SEROQUEL) tablet 50 mg  50 mg Oral TID PRN Encarnacion Slates, NP      . simvastatin (ZOCOR) tablet 40 mg  40 mg Oral q morning - 10a Laverle Hobby, PA-C   40 mg at 06/24/15 1058  . thiamine (B-1) injection 100 mg  100 mg Intramuscular Once Laverle Hobby, PA-C   100 mg at 06/19/15 0127  . thiamine (VITAMIN B-1) tablet 100 mg  100 mg Oral Daily Laverle Hobby, PA-C   100 mg at 06/24/15 3335   Lab Results:  No results found for this or any previous visit (from the past 48 hour(s)).  Blood Alcohol level:  Lab Results  Component Value Date   ETH 34* 06/18/2015   ETH 118* 04/22/2015   Physical Findings: AIMS: Facial and Oral Movements Muscles of Facial Expression: None, normal Lips and Perioral Area: None, normal Jaw: None, normal Tongue: None, normal,Extremity Movements Upper (arms, wrists, hands, fingers): None, normal Lower (legs, knees, ankles, toes): None, normal, Trunk Movements Neck, shoulders, hips: None, normal, Overall Severity Severity of abnormal movements (highest score from questions above): None, normal Incapacitation due to abnormal movements: None, normal Patient's awareness of abnormal movements (rate only patient's report): No Awareness, Dental Status Current problems with teeth and/or dentures?: No Does  patient usually wear dentures?: Yes  CIWA:  CIWA-Ar Total: 0 COWS:  COWS Total Score: 3  Musculoskeletal: Strength & Muscle Tone: within normal limits Gait & Station: normal Patient leans: normal  Psychiatric Specialty Exam: Review of Systems  Constitutional: Negative for malaise/fatigue.  HENT: Negative.   Eyes: Negative.   Respiratory: Negative.   Cardiovascular: Negative.   Gastrointestinal: Negative.   Musculoskeletal: Negative.   Skin: Negative.   Neurological: Negative for weakness.  Endo/Heme/Allergies: Negative.   Psychiatric/Behavioral: Positive for depression and substance abuse. Negative for suicidal ideas and hallucinations. The patient is nervous/anxious.   All other systems reviewed and are negative.   Blood pressure 104/76, pulse 93, temperature 98.1 F (36.7 C), temperature source Oral, resp. rate 18, height '5\' 8"'$  (1.727 m), weight 85.276 kg (188 lb), SpO2 98 %.Body mass index is 28.59 kg/(m^2).  General Appearance: Casual,Pleasant, clam and cooperative   Eye Contact::  Fair  Speech:  Clear and Coherent  Volume:  fluctuates  Mood:  Anxious and worried 8/10 anxiety   Affect:  anxious worried  Thought Process:  Coherent and Goal Directed  Orientation:  Full (Time, Place, and Person)  Thought Content:  symptoms events worries concerns  Suicidal Thoughts:  No  Homicidal Thoughts:  No  Memory:  Immediate;   Fair Recent;   Fair Remote;   Fair  Judgement:  Fair  Insight:  Present  Psychomotor Activity:  Restlessness-improving   Concentration:  Fair  Recall:  [employer] of Knowledge:Fair  Language: Fair  Akathisia:  No  Handed:  Right  AIMS (if indicated):     Assets:  Desire for Improvement Resilience  ADL's:  Intact  Cognition: WNL  Sleep:  Number of Hours: 6.75   I agree with current treatment plan on 06/23/2015, Patient seen face-to-face for psychiatric evaluation follow-up, chart reviewed and discussed with MD Eappen. Reviewed the information  documented and agree with the treatment plan.  Treatment Plan Summary Daily contact with patient to assess and evaluate symptoms and progress in treatment and Medication management  Continue Buspar 5 mg Po BID for anxiety/mood stabilization  Continue Seroquel 50 mg PO PRN for increased anxiety Continue Neurontin 300 mg PO TID for mood stabilization Supportive approach/coping skills Alcohol dependence; completed the Ativan detox protocol/work a relapse prevention plan Anxiety; continue the Neurontin/Vistaril (off label) Ruminative thinking; continue the Seroquel 400 mg QHS Explore residential treatment options CLSW working on dispostion to [place].  Encarnacion Slates, NP, [place] 06/24/2015, 4:06 PM

## 2015-06-24 NOTE — Progress Notes (Signed)
D:  Patient's self inventory sheet, patient has fair sleep, sleep medication is helpful.  Good appetite, low energy level, poor concentration.  Rated depression and hopeless 7, anxiety 9.  Denied withdrawals.  Denied SI.  Physical problems, anxiety attack at 0745.  Denied pain.  Goal is to work on aftercare.  Plans to talk to SW>  Took anxiety meds and went to room, rested.  Does have discharge plans. A:  Medications administered per MD orders.  Safety maintained with 15 minute checks. R:  Denied SI and HI, contracts for safety.  Denied A/V hallucinations.  Safety maintained with 15 minute checks.

## 2015-06-24 NOTE — Progress Notes (Signed)
Pt reports he had a good day.  He spends most of the evening in the dayroom watching TV.  He is med compliant.  He denies SI/HI/AVH.  He plans to go for rehab after his detox.  Pt makes his needs known to staff.  Support and encouragement offered.  Discharge plans are in process.  Safety maintained with q15 minute checks.

## 2015-06-25 MED ORDER — BUSPIRONE HCL 10 MG PO TABS
10.0000 mg | ORAL_TABLET | Freq: Two times a day (BID) | ORAL | Status: DC
  Administered 2015-06-25 – 2015-06-26 (×2): 10 mg via ORAL
  Filled 2015-06-25 (×6): qty 1

## 2015-06-25 MED ORDER — GABAPENTIN 400 MG PO CAPS
400.0000 mg | ORAL_CAPSULE | Freq: Three times a day (TID) | ORAL | Status: DC
  Administered 2015-06-25 – 2015-06-26 (×5): 400 mg via ORAL
  Filled 2015-06-25 (×12): qty 1

## 2015-06-25 MED ORDER — BUSPIRONE HCL 5 MG PO TABS
5.0000 mg | ORAL_TABLET | Freq: Once | ORAL | Status: AC
  Administered 2015-06-25: 5 mg via ORAL
  Filled 2015-06-25: qty 1

## 2015-06-25 MED ORDER — PROPRANOLOL HCL 10 MG PO TABS
10.0000 mg | ORAL_TABLET | Freq: Three times a day (TID) | ORAL | Status: DC
Start: 2015-06-25 — End: 2015-06-26
  Administered 2015-06-25 – 2015-06-26 (×4): 10 mg via ORAL
  Filled 2015-06-25 (×9): qty 1

## 2015-06-25 NOTE — [facility] Group Notes (Signed)
[facility] LCSW Aftercare Discharge Planning Group Note   06/25/2015 10:29 AM  Participation Quality:  Appropriate   Mood/Affect:  Anxious  Depression Rating:  8  Anxiety Rating:  10  Thoughts of Suicide:  No Will you contract for safety?   NA  Current AVH:  No  Plan for Discharge/Comments:  Pt reports that he is anxious about getting into treatment. [facility] referral pending. Pt given [facility] information ([facility] based program in [facility]) and he was encouraged to call to check bed availability/complete phone screening. Information also faxed to [facility] house-possible openings on Monday.   Transportation Means: car in parking lot   Supports: no identified supports "my family is done with me."   Smart, Water quality scientist LCSW

## 2015-06-25 NOTE — Progress Notes (Signed)
Patient ID: Larry French, male   DOB: 1960/04/29, 55 y.o.   MRN: [id number] Patient ID: Larry French, male   DOB: July 12, 1960, 55 y.o.   MRN: [id number] [facility] MD Progress Note  06/25/2015 5:22 PM Larry French  MRN:  [id number]   Subjective: Larry French reports "I'm worse today than when I first came in to the hospital. The Social worker is trying to get me to [place]. I want my medicines to be right before I leave here. I feel very overwhelmed. I feel very anxious. I need something, but I don't know what"  Objective:Larry French is awake, alert and oriented X4, found resting in dayroom. Denies suicidal or homicidal ideation. Denies auditory or visual hallucination & does not appear to be responding to internal stimuli. Patient reports interacting well with staff and others. Patient reports he is medication compliant without mediation side effects. He says today that he is feeling worse than on the day of his admission. He is complaining of worsening anxiety. Patient is still ruminative about "poor care" that he received at [place] ([facility]).  He got into [facility] for further treatment, the good news helped him relax some. Reports good appetite and is resting okay.  Support, encouragement and reassurance was provided. Is likely going to be discharged in am to [place].  Principal Problem: Alcohol abuse with alcohol-induced mental disorder [facility]) Diagnosis:   Patient Active Problem List   Diagnosis Date Noted  . Alcohol abuse with alcohol-induced mental disorder ([place]) [F10.988] 06/19/2015  . Benzodiazepine dependence ([place]) [F13.20] 04/25/2015  . Alcohol abuse with alcohol-induced mood disorder ([place]) [F10.14] 04/22/2015  . GAD (generalized anxiety disorder) [F41.1]   . Severe episode of recurrent major depressive disorder, without psychotic features ([place]) [F33.2]   . Alcohol abuse [F10.10] 04/17/2015  . Benzodiazepine abuse [F13.10] 04/17/2015  . Posttraumatic stress disorder [F43.10] 04/17/2015   . Suicidal ideation [R45.851] 04/17/2015  . Abnormal EKG [R94.31]   . Benzodiazepine withdrawal ([place]) [F13.239]   . NSTEMI (non-ST elevated myocardial infarction) ([place]) [I21.4]   . Positive cardiac stress test [R94.39]   . Elevated troponin [R79.89]   . Withdrawal from benzodiazepine ([place]) [F13.239] 04/05/2015  . STEMI, RCA DES 05/28/10 [I21.3] 06/01/2011  . CAD, residual CFX LAD disease [I25.10] 06/01/2011  . Tobacco abuse [Z72.0] 05/29/2011  . History of ETOH abuse and Xanax abuse. In rehab pta [F10.10] 05/29/2011   Total Time spent with patient: 15 minutes  Past Psychiatric History: See admission H and P  Past Medical History:  Past Medical History  Diagnosis Date  . Alcohol abuse     Recovered x 15 months  . CA - skin cancer   . Mental disorder   . Headache(784.0)   . Anxiety   . Depression   . CAD, residual CFX LAD disease 06/01/2011    a. s/p inferior ST elevation MI s/p PCI/DES to RCA on 05/29/2011; b. residual LAD and LCx CAD tx'd on 06/02/11 cath with LAD CAD successfully tx'd w/ PCI/DES, LCx managaged medically   . Hypertension   . Hyperlipemia   . PTSD (post-traumatic stress disorder)   . Polysubstance abuse     a. etoh, xanax, tobacco  . MI (myocardial infarction) [facility])     Past Surgical History  Procedure Laterality Date  . Tonsillectomy and adenoidectomy    . Left heart cath Right 05/29/2011    Procedure: LEFT HEART CATH;  Surgeon: Leonie Man, MD;  Location: [facility] CATH LAB;  Service: Cardiovascular;  Laterality: Right;  . Left  heart catheterization with coronary angiogram [name] 06/01/2011    Procedure: LEFT HEART CATHETERIZATION WITH CORONARY ANGIOGRAM;  Surgeon: Leonie Man, MD;  Location: [facility] CATH LAB;  Service: Cardiovascular;  Laterality: [name];  relook cath, possible PCI  . Cardiac catheterization Bilateral 04/09/2015    Procedure: Coronary Angiogram;  Surgeon: Wellington Hampshire, MD;  Location: [place] CV LAB;  Service: Cardiovascular;  Laterality:  Bilateral;  . Cardiac catheterization [name] 04/09/2015    Procedure: Coronary Stent Intervention;  Surgeon: Wellington Hampshire, MD;  Location: [place] CV LAB;  Service: Cardiovascular;  Laterality: [name];   Family History:  Family History  Problem Relation Age of Onset  . Coronary artery disease Maternal Grandfather 107    Died   Family Psychiatric  History: See admission H and P  Social History:  History  Alcohol Use  . Yes    Comment: this week     History  Drug Use No    Social History   Social History  . Marital Status: Single    Spouse Name: [name]  . Number of Children: 0  . Years of Education: [name]   Occupational History  . Student    Social History Main Topics  . Smoking status: Current Every Day Smoker -- 1.50 packs/day for 30 years    Types: Cigarettes  . Smokeless tobacco: None  . Alcohol Use: Yes     Comment: this week  . Drug Use: No  . Sexual Activity: No   Other Topics Concern  . None   Social History Narrative   Additional Social History:   Sleep: Good  Appetite:  Fair  Current Medications: Current Facility-Administered Medications  Medication Dose Route Frequency Provider Last Rate Last Dose  . alum & mag hydroxide-simeth (MAALOX/MYLANTA) 200-200-20 MG/5ML suspension 30 mL  30 mL Oral Q4H PRN Laverle Hobby, PA-C      . aspirin EC tablet 81 mg  81 mg Oral Daily Laverle Hobby, PA-C   81 mg at 06/25/15 0827  . busPIRone (BUSPAR) tablet 10 mg  10 mg Oral BID Encarnacion Slates, NP   10 mg at 06/25/15 1707  . clopidogrel (PLAVIX) tablet 75 mg  75 mg Oral Daily Laverle Hobby, PA-C   75 mg at 06/25/15 0827  . cyclobenzaprine (FLEXERIL) tablet 10 mg  10 mg Oral BID Laverle Hobby, PA-C   10 mg at 06/25/15 1707  . gabapentin (NEURONTIN) capsule 400 mg  400 mg Oral TID PC & HS Encarnacion Slates, NP   400 mg at 06/25/15 1707  . hydrOXYzine (ATARAX/VISTARIL) tablet 50 mg  50 mg Oral Q6H PRN Laverle Hobby, PA-C   50 mg at 06/25/15 [id number]  . losartan (COZAAR)  tablet 25 mg  25 mg Oral Daily Laverle Hobby, PA-C   25 mg at 06/25/15 0827  . magnesium hydroxide (MILK OF MAGNESIA) suspension 30 mL  30 mL Oral Daily PRN Laverle Hobby, PA-C      . metoprolol tartrate (LOPRESSOR) tablet 25 mg  25 mg Oral BID Laverle Hobby, PA-C   25 mg at 06/25/15 1707  . multivitamin with minerals tablet 1 tablet  1 tablet Oral Daily Laverle Hobby, PA-C   1 tablet at 06/25/15 0827  . nicotine (NICODERM CQ - dosed in mg/24 hours) patch 21 mg  21 mg Transdermal Daily Laverle Hobby, PA-C   21 mg at 06/25/15 0830  . nitroGLYCERIN (NITROSTAT) SL tablet 0.4 mg  0.4  mg Sublingual Q5 min PRN Laverle Hobby, PA-C      . propranolol (INDERAL) tablet 10 mg  10 mg Oral TID Encarnacion Slates, NP   10 mg at 06/25/15 1707  . QUEtiapine (SEROQUEL) tablet 400 mg  400 mg Oral QHS Laverle Hobby, PA-C   400 mg at 06/24/15 2102  . QUEtiapine (SEROQUEL) tablet 50 mg  50 mg Oral TID PRN Encarnacion Slates, NP   50 mg at 06/25/15 [id number]  . simvastatin (ZOCOR) tablet 40 mg  40 mg Oral q morning - 10a Laverle Hobby, PA-C   40 mg at 06/25/15 1059  . thiamine (B-1) injection 100 mg  100 mg Intramuscular Once Laverle Hobby, PA-C   100 mg at 06/19/15 0127  . thiamine (VITAMIN B-1) tablet 100 mg  100 mg Oral Daily Laverle Hobby, PA-C   100 mg at 06/25/15 [id number]   Lab Results:  No results found for this or any previous visit (from the past 45 hour(s)).  Blood Alcohol level:  Lab Results  Component Value Date   ETH 34* 06/18/2015   ETH 118* 04/22/2015   Physical Findings: AIMS: Facial and Oral Movements Muscles of Facial Expression: None, normal Lips and Perioral Area: None, normal Jaw: None, normal Tongue: None, normal,Extremity Movements Upper (arms, wrists, hands, fingers): None, normal Lower (legs, knees, ankles, toes): None, normal, Trunk Movements Neck, shoulders, hips: None, normal, Overall Severity Severity of abnormal movements (highest score from questions above): None,  normal Incapacitation due to abnormal movements: None, normal Patient's awareness of abnormal movements (rate only patient's report): No Awareness, Dental Status Current problems with teeth and/or dentures?: No Does patient usually wear dentures?: Yes  CIWA:  CIWA-Ar Total: 0 COWS:  COWS Total Score: 2  Musculoskeletal: Strength & Muscle Tone: within normal limits Gait & Station: normal Patient leans: normal  Psychiatric Specialty Exam: Review of Systems  Constitutional: Negative for malaise/fatigue.  HENT: Negative.   Eyes: Negative.   Respiratory: Negative.   Cardiovascular: Negative.   Gastrointestinal: Negative.   Musculoskeletal: Negative.   Skin: Negative.   Neurological: Negative for weakness.  Endo/Heme/Allergies: Negative.   Psychiatric/Behavioral: Positive for depression and substance abuse. Negative for suicidal ideas and hallucinations. The patient is nervous/anxious.   All other systems reviewed and are negative.   Blood pressure 125/76, pulse 88, temperature 98.2 F (36.8 C), temperature source Oral, resp. rate 18, height 5\' 8"  (1.727 m), weight 85.276 kg (188 lb), SpO2 98 %.Body mass index is 28.59 kg/(m^2).  General Appearance: Casual,Pleasant, clam and cooperative   Eye Contact::  Fair  Speech:  Clear and Coherent  Volume:  fluctuates  Mood:  Anxious and worried 8/10 anxiety   Affect:  anxious worried  Thought Process:  Coherent and Goal Directed  Orientation:  Full (Time, Place, and Person)  Thought Content:  symptoms events worries concerns  Suicidal Thoughts:  No  Homicidal Thoughts:  No  Memory:  Immediate;   Fair Recent;   Fair Remote;   Fair  Judgement:  Fair  [place]:  Present  Psychomotor Activity:  Restlessness-improving   Concentration:  Fair  Recall:  [employer] of Knowledge:Fair  Language: Fair  Akathisia:  No  Handed:  Right  AIMS (if indicated):     Assets:  Desire for Improvement Resilience  ADL's:  Intact  Cognition: WNL   Sleep:  Number of Hours: 6.75   Treatment Plan Summary Daily contact with patient to assess and evaluate symptoms and progress  in treatment and Medication management  Increased Buspar 10 mg Po tid for anxiety/mood stabilization. Worsening symptoms of anxiety: Initiate Propranolol 10 mg tid for anxiety Continue Seroquel 50 mg PO PRN for increased anxiety Increased Neurontin to 400 mg PO QID for mood stabilization for severe agitation Supportive approach/coping skills Alcohol dependence; completed the Ativan detox protocol, continue work on a relapse prevention plan Ruminative thinking; continue the Seroquel 400 mg QHS Explore residential treatment options, as today, [facility]. CLSW working on [employer] to [employer].  Encarnacion Slates, NP, PMHNP 06/25/2015, 5:22 PM
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
## 2015-06-25 NOTE — Progress Notes (Signed)
Recreation Therapy Notes  Date: 03.08.2017 Time: 9:30am Location: [address] Group Room  Group Topic: Stress Management  Goal Area(s) Addresses:  Patient will actively participate in stress management techniques presented during session.   Behavioral Response: Did not attend.   Laureen Ochs Tamarick Kovalcik, LRT/CTRS         Eligh Rybacki L 06/25/2015 10:12 AM

## 2015-06-25 NOTE — [facility] Group Notes (Signed)
[place] LCSW Group Therapy  06/25/2015 1:15 PM  Type of Therapy:  Group Therapy  Participation Level:  Active  Participation Quality:  Attentive  Affect:  Appropriate  Cognitive:  Alert   Insight:  Improving  Engagement in Therapy:  Improving  Modes of Intervention:  Confrontation, Discussion, Education, Exploration, Problem-solving, Rapport Building, Socialization and Support  Summary of Progress/Problems: Emotion Regulation: This group focused on both positive and negative emotion identification and allowed group members to process ways to identify feelings, regulate negative emotions, and find healthy ways to manage internal/external emotions. Group members were asked to reflect on a time when their reaction to an emotion led to a negative outcome and explored how alternative responses using emotion regulation would have benefited them. Group members were also asked to discuss a time when emotion regulation was utilized when a negative emotion was experienced. Larry French was attentive and engaged during today's processing group. He shared that he struggles with guilt and shame. "I'm so embarrassed about my choices and anxious about where I'm going from here." Larry French shared that he is working on mindfulness and grounding techniques to help him stay in the moment and stop living in the past.    Smart, Saxman LCSW 06/25/2015, 1:15 PM

## 2015-06-25 NOTE — Progress Notes (Signed)
Patient very anxious today until mid to late afternoon. Reporting prn seroquel and vistaril are of no help. Frequently seeking and receiving staff report. "I don't know what's wrong with me today." Rates his depression at a 7/10, hopelessness and anxiety both at an 8/10. Goal for today was to set up after care plans which he was able to accomplish with SW. Patient offered emotional support. Medicated per orders and initiated new med changes which he reports have helped significantly this evening. States the change in meds has helped him calm enough to utilize coping skills. No SI/HI and remains safe on level III obs. States he will discharge in the morning with plan to go to [facility]. Jamie Kato

## 2015-06-25 NOTE — Progress Notes (Signed)
Pt states he had a good day.  He states he is waiting for a bed at [facility].  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  Pt makes his needs known to staff.  He denies SI/HI/AVH.  He is med compliant and cooperative with staff.  He spends his free time in the dayroom watching TV with minimal interaction with his peers.  Pt has been participating in groups.  Discharge plans are in process.  Support and encouragement offered.  Safety maintained with q15 minute checks.

## 2015-06-25 NOTE — Progress Notes (Signed)
Pt accepted for [facility] for tomorrow. Admission at 2pm. Pt must discharge by 10:00AM to get clothes and drive himself to facility.   Maxie Better, MSW, LCSW Clinical Social Worker 06/25/2015 4:33 PM

## 2015-06-25 NOTE — Progress Notes (Signed)
Adult Psychoeducational Group Note  Date:  06/25/2015 Time:  11:39 PM  Group Topic/Focus:  Wrap-Up Group:   The focus of this group is to help patients review their daily goal of treatment and discuss progress on daily workbooks.  Participation Level:  Active  Participation Quality:  Appropriate  Affect:  Appropriate  Cognitive:  Alert  Insight: Appropriate  Engagement in Group:  Engaged  Modes of Intervention:  Activity  Additional Comments:  Pt went to group and was engaged in group   Peta Peachey R 06/25/2015, 11:39 PM

## 2015-06-26 DIAGNOSIS — F1014 Alcohol abuse with alcohol-induced mood disorder: Principal | ICD-10-CM

## 2015-06-26 MED ORDER — CLOPIDOGREL BISULFATE 75 MG PO TABS: 75.0000 mg | ORAL_TABLET | Freq: Every day | ORAL | Status: DC

## 2015-06-26 MED ORDER — NICOTINE 21 MG/24HR TD PT24: 21.0000 mg | MEDICATED_PATCH | Freq: Every day | TRANSDERMAL | Status: DC

## 2015-06-26 MED ORDER — SIMVASTATIN 40 MG PO TABS: 40.0000 mg | ORAL_TABLET | Freq: Every morning | ORAL | Status: DC

## 2015-06-26 MED ORDER — BUSPIRONE HCL 10 MG PO TABS: 10.0000 mg | ORAL_TABLET | Freq: Two times a day (BID) | ORAL | Status: DC

## 2015-06-26 MED ORDER — CYCLOBENZAPRINE HCL 10 MG PO TABS: 10.0000 mg | ORAL_TABLET | Freq: Two times a day (BID) | ORAL | Status: DC

## 2015-06-26 MED ORDER — PROPRANOLOL HCL 10 MG PO TABS: 10.0000 mg | ORAL_TABLET | Freq: Three times a day (TID) | ORAL | Status: DC

## 2015-06-26 MED ORDER — LOSARTAN POTASSIUM 25 MG PO TABS: 25.0000 mg | ORAL_TABLET | Freq: Every day | ORAL | Status: DC

## 2015-06-26 MED ORDER — HYDROXYZINE HCL 50 MG PO TABS: 50.0000 mg | ORAL_TABLET | Freq: Four times a day (QID) | ORAL | Status: DC | PRN

## 2015-06-26 MED ORDER — METOPROLOL TARTRATE 25 MG PO TABS: 25.0000 mg | ORAL_TABLET | Freq: Two times a day (BID) | ORAL | Status: DC

## 2015-06-26 MED ORDER — QUETIAPINE FUMARATE 50 MG PO TABS: 50.0000 mg | ORAL_TABLET | Freq: Three times a day (TID) | ORAL | Status: DC | PRN

## 2015-06-26 MED ORDER — QUETIAPINE FUMARATE 400 MG PO TABS: 400.0000 mg | ORAL_TABLET | Freq: Every day | ORAL | Status: DC

## 2015-06-26 MED ORDER — ASPIRIN 81 MG PO TBEC: 81.0000 mg | DELAYED_RELEASE_TABLET | Freq: Every day | ORAL | Status: DC

## 2015-06-26 MED ORDER — GABAPENTIN 400 MG PO CAPS: 400.0000 mg | ORAL_CAPSULE | Freq: Three times a day (TID) | ORAL | Status: DC
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
## 2015-06-26 NOTE — Progress Notes (Signed)
  [facility] Case Management Discharge Plan :  Will you be returning to the same living situation after discharge:  No-pt accepted to [facility] for today at 2:00PM At discharge, do you have transportation home?: Yes,  pt's car in parking lot. he plans to drive himself to [facility].  Do you have the ability to pay for your medications: Yes,  mental health  Release of information consent forms completed and submitted to medical records by CSW.  Patient to Follow up at: Follow-up Information    Follow up with [facility] On 06/26/2015.   Why:  Per Tega in admissions, you have been accepted for admission for today at 2:00PM.    Contact information:   [address], [place] [address] Phone: [phone] Fax: [phone]      Next level of care provider has access to [place] and Suicide Prevention discussed: Yes,  SPE completed with pt, as he declined to consent to family contact.  Have you used any form of tobacco in the last 30 days? (Cigarettes, Smokeless Tobacco, Cigars, and/or Pipes): Yes  Has patient been referred to the Quitline?: Patient refused referral  Patient has been referred for addiction treatment: Yes-see above.   Smart, Glorianne Proctor LCSW 06/26/2015, 8:25 AM

## 2015-06-26 NOTE — Discharge Summary (Signed)
Physician Discharge Summary Note  Patient:  Larry French is an 55 y.o., male MRN:  [id number] DOB:  1960-11-02 Patient phone:  [phone] (home)  Patient address:   [address]  [place] [address],  Total Time spent with patient: 30 minutes  Date of Admission:  06/19/2015 Date of Discharge: 06/26/2015  Reason for Admission:  Substance abuse  Principal Problem: Alcohol abuse with alcohol-induced mental disorder [facility]) Discharge Diagnoses: Patient Active Problem List   Diagnosis Date Noted  . Alcohol abuse with alcohol-induced mood disorder ([place]) [F10.14] 04/22/2015    Priority: High  . Severe episode of recurrent major depressive disorder, without psychotic features ([place]) [F33.2]     Priority: High  . Alcohol abuse with alcohol-induced mental disorder ([place]) [F10.988] 06/19/2015  . Benzodiazepine dependence ([place]) [F13.20] 04/25/2015  . GAD (generalized anxiety disorder) [F41.1]   . Alcohol abuse [F10.10] 04/17/2015  . Benzodiazepine abuse [F13.10] 04/17/2015  . Posttraumatic stress disorder [F43.10] 04/17/2015  . Suicidal ideation [R45.851] 04/17/2015  . Abnormal EKG [R94.31]   . Benzodiazepine withdrawal ([place]) [F13.239]   . NSTEMI (non-ST elevated myocardial infarction) ([place]) [I21.4]   . Positive cardiac stress test [R94.39]   . Elevated troponin [R79.89]   . Withdrawal from benzodiazepine ([place]) [F13.239] 04/05/2015  . STEMI, RCA DES 05/28/10 [I21.3] 06/01/2011  . CAD, residual CFX LAD disease [I25.10] 06/01/2011  . Tobacco abuse [Z72.0] 05/29/2011  . History of ETOH abuse and Xanax abuse. In rehab pta [F10.10] 05/29/2011    Past Psychiatric History:  See above noted  Past Medical History:  Past Medical History  Diagnosis Date  . Alcohol abuse     Recovered x 15 months  . CA - skin cancer   . Mental disorder   . Headache(784.0)   . Anxiety   . Depression   . CAD, residual CFX LAD disease 06/01/2011    a. s/p inferior ST elevation MI s/p PCI/DES to RCA  on 05/29/2011; b. residual LAD and LCx CAD tx'd on 06/02/11 cath with LAD CAD successfully tx'd w/ PCI/DES, LCx managaged medically   . Hypertension   . Hyperlipemia   . PTSD (post-traumatic stress disorder)   . Polysubstance abuse     a. etoh, xanax, tobacco  . MI (myocardial infarction) [facility])     Past Surgical History  Procedure Laterality Date  . Tonsillectomy and adenoidectomy    . Left heart cath Right 05/29/2011    Procedure: LEFT HEART CATH;  Surgeon: Larry Man, MD;  Location: [facility] CATH LAB;  Service: Cardiovascular;  Laterality: Right;  . Left heart catheterization with coronary angiogram [name] 06/01/2011    Procedure: LEFT HEART CATHETERIZATION WITH CORONARY ANGIOGRAM;  Surgeon: Larry Man, MD;  Location: [facility] CATH LAB;  Service: Cardiovascular;  Laterality: [name];  relook cath, possible PCI  . Cardiac catheterization Bilateral 04/09/2015    Procedure: Coronary Angiogram;  Surgeon: Larry Hampshire, MD;  Location: [place] CV LAB;  Service: Cardiovascular;  Laterality: Bilateral;  . Cardiac catheterization [name] 04/09/2015    Procedure: Coronary Stent Intervention;  Surgeon: Larry Hampshire, MD;  Location: [place] CV LAB;  Service: Cardiovascular;  Laterality: [name];   Family History:  Family History  Problem Relation Age of Onset  . Coronary artery disease Maternal Grandfather 72    Died   Family Psychiatric  History:  See above noted Social History:  History  Alcohol Use  . Yes    Comment: this week     History  Drug Use No  Social History   Social History  . Marital Status: Single    Spouse Name: [name]  . Number of Children: 0  . Years of Education: [name]   Occupational History  . Student    Social History Main Topics  . Smoking status: Current Every Day Smoker -- 1.50 packs/day for 30 years    Types: Cigarettes  . Smokeless tobacco: None  . Alcohol Use: Yes     Comment: this week  . Drug Use: No  . Sexual Activity: No   Other Topics Concern  .  None   Social History Narrative    Hospital Course:  Larry French 55 Y/o male came in admitted with alcohol relapse.  He was admitted to [facility] in January and entered into [facility] program ([facility]) for 4 weeks and did not complete the program as he did not like it.  He also did not have his meds. Larry French was admitted for Alcohol abuse with alcohol-induced mental disorder [facility]) and crisis management.  He was treated with the following medications listed below.  Larry French was discharged with current medication and was instructed on how to take medications as prescribed; (details listed below under Medication List).  Medical problems were identified and treated as needed.  Home medications were restarted as appropriate.  Improvement was monitored by observation and Larry French daily report of symptom reduction.  Emotional and mental status was monitored by daily self-inventory reports completed by Larry French and clinical staff.         Larry French was evaluated by the treatment team for stability and plans for continued recovery upon discharge.  Larry French motivation was an integral factor for scheduling further treatment.  Employment, transportation, bed availability, health status, family support, and any pending legal issues were also considered during his hospital stay.  He was offered further treatment options upon discharge including but not limited to Residential, Intensive Outpatient, and Outpatient treatment.  Larry French will follow up with the services as listed below under Follow Up Information.     Upon completion of this admission the Larry French was both mentally and medically stable for discharge denying suicidal/homicidal ideation, auditory/visual/tactile hallucinations, delusional thoughts and paranoia.     Physical Findings: AIMS: Facial and Oral Movements Muscles of Facial Expression: None,  normal Lips and Perioral Area: None, normal Jaw: None, normal Tongue: None, normal,Extremity Movements Upper (arms, wrists, hands, fingers): None, normal Lower (legs, knees, ankles, toes): None, normal, Trunk Movements Neck, shoulders, hips: None, normal, Overall Severity Severity of abnormal movements (highest score from questions above): None, normal Incapacitation due to abnormal movements: None, normal Patient's awareness of abnormal movements (rate only patient's report): No Awareness, Dental Status Current problems with teeth and/or dentures?: No Does patient usually wear dentures?: Yes  CIWA:  CIWA-Ar Total: 0 COWS:  COWS Total Score: 2  Musculoskeletal: Strength & Muscle Tone: within normal limits Gait & Station: normal Patient leans: [name]  Psychiatric Specialty Exam:  SEE MD SRA Review of Systems  Psychiatric/Behavioral: Negative for depression, suicidal ideas and hallucinations.  All other systems reviewed and are negative.   Blood pressure 112/81, pulse 97, temperature 98.2 F (36.8 C), temperature source Oral, resp. rate 18, height 5\' 8"  (1.727 m), weight 85.276 kg (188 lb), SpO2 98 %.Body mass index is 28.59 kg/(m^2).  Have you used any form of tobacco in the last 30 days? (Cigarettes, Smokeless Tobacco, Cigars, and/or Pipes): Yes  Has this  patient used any form of tobacco in the last 30 days? (Cigarettes, Smokeless Tobacco, Cigars, and/or Pipes) Yes, [name]  Blood Alcohol level:  Lab Results  Component Value Date   ETH 34* 06/18/2015   ETH 118* 0000000    Metabolic Disorder Labs:  Lab Results  Component Value Date   HGBA1C 5.0 04/06/2015   No results found for: PROLACTIN Lab Results  Component Value Date   CHOL 158 04/06/2015   TRIG 234* 04/06/2015   HDL 44 04/06/2015   CHOLHDL 3.6 04/06/2015   VLDL 47* 04/06/2015   LDLCALC 67 04/06/2015   LDLCALC 98 06/01/2011    See Psychiatric Specialty Exam and Suicide Risk Assessment completed by Attending  Physician prior to discharge.  Discharge destination:  Home  Is patient on multiple antipsychotic therapies at discharge:  No   Has Patient had three or more failed trials of antipsychotic monotherapy by history:  No  Recommended Plan for Multiple Antipsychotic Therapies: NA     Medication List    STOP taking these medications        acetaminophen 500 MG tablet  Commonly known as:  TYLENOL     calcium carbonate 500 MG chewable tablet  Commonly known as:  TUMS - dosed in mg elemental calcium     nitroGLYCERIN 0.4 MG SL tablet  Commonly known as:  NITROSTAT      TAKE these medications      Indication   aspirin 81 MG EC tablet  Take 1 tablet (81 mg total) by mouth daily.   Indication:  heart health     busPIRone 10 MG tablet  Commonly known as:  BUSPAR  Take 1 tablet (10 mg total) by mouth 2 (two) times daily.   Indication:  Anxiety     clopidogrel 75 MG tablet  Commonly known as:  PLAVIX  Take 1 tablet (75 mg total) by mouth daily.   Indication:  anticoagulation     cyclobenzaprine 10 MG tablet  Commonly known as:  FLEXERIL  Take 1 tablet (10 mg total) by mouth 2 (two) times daily.   Indication:  Muscle Spasm     gabapentin 400 MG capsule  Commonly known as:  NEURONTIN  Take 1 capsule (400 mg total) by mouth 4 (four) times daily - after meals and at bedtime.   Indication:  Neuropathic Pain     hydrOXYzine 50 MG tablet  Commonly known as:  ATARAX/VISTARIL  Take 1 tablet (50 mg total) by mouth every 6 (six) hours as needed for anxiety.   Indication:  Anxiety Neurosis     losartan 25 MG tablet  Commonly known as:  COZAAR  Take 1 tablet (25 mg total) by mouth daily.   Indication:  High Blood Pressure     metoprolol tartrate 25 MG tablet  Commonly known as:  LOPRESSOR  Take 1 tablet (25 mg total) by mouth 2 (two) times daily.   Indication:  High Blood Pressure     nicotine 21 mg/24hr patch  Commonly known as:  NICODERM CQ - dosed in mg/24 hours  Place 1  patch (21 mg total) onto the skin daily.   Indication:  Nicotine Addiction     propranolol 10 MG tablet  Commonly known as:  INDERAL  Take 1 tablet (10 mg total) by mouth 3 (three) times daily.   Indication:  Generalized anxiety     QUEtiapine 400 MG tablet  Commonly known as:  SEROQUEL  Take 1 tablet (400 mg total) by mouth  at bedtime.   Indication:  psychosis     QUEtiapine 50 MG tablet  Commonly known as:  SEROQUEL  Take 1 tablet (50 mg total) by mouth 3 (three) times daily as needed (for anxeity).   Indication:  agitation     simvastatin 40 MG tablet  Commonly known as:  ZOCOR  Take 1 tablet (40 mg total) by mouth every morning.   Indication:  mixed dyslipidemia           Follow-up Information    Follow up with [facility] On 06/26/2015.   Why:  Per Tega in admissions, you have been accepted for admission for today at 2:00PM.    Contact information:   [address] [place], [place] [address] Phone: [phone] Fax: [phone]      Follow-up recommendations:  Activity:  as tol Diet:  as tol  Comments:  1.  Take all your medications as prescribed.              2.  Report any adverse side effects to outpatient provider.                       3.  Patient instructed to not use alcohol or illegal drugs while on prescription medicines.            4.  In the event of worsening symptoms, instructed patient to call 911, the crisis hotline or go to nearest emergency room for evaluation of symptoms.  Signed: Janett Labella, NP [facility] 06/26/2015, 9:05 AM  I personally assessed the patient and formulated the plan Geralyn Flash A. Sabra Heck, M.D.

## 2015-06-26 NOTE — Progress Notes (Signed)
Patient ID: Larry French, male   DOB: 12-26-1960, 55 y.o.   MRN: [id number]  Patient was discharged to home/selfcare.  Patient is aware the he has a follow up today at [facility]: [facility] for rehabilitation.  Patient acknowledged understanding of all discharge instructions.   Patient was anxious but pleasant upon discharge.  Patient acknowledged the receipt of his belongings and contracted to for safety upon discharge.

## 2015-06-26 NOTE — Progress Notes (Signed)
Pt reports his day started out rough, but he is doing better this evening.  He says he has a bed at [facility] and will be going there tomorrow.  He denies SI/HI/AVH.  He still says he is having a hard time with anxiety, but that the MD started him on a new medication and hopes it will help bring down his anxiety level.  He says after discharge, he has to make a stop to make to get some of his belongings, then he will drive himself to [place].  Pt has been pleasant and cooperative with staff and interacts appropriately with his peers.  Pt makes his needs known to staff.  Support and encouragement offered.  Safety maintained with q15 minute checks.
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
## 2015-12-09 ENCOUNTER — Ambulatory Visit (HOSPITAL_COMMUNITY)
Admission: RE | Admit: 2015-12-09 | Discharge: 2015-12-09 | Disposition: A | Discharge status: Home / Self Care | Payer: No Typology Code available for payment source | Attending: Psychiatry | Admitting: Psychiatry

## 2015-12-09 DIAGNOSIS — F4310 Post-traumatic stress disorder, unspecified: Secondary | ICD-10-CM | POA: Insufficient documentation

## 2015-12-09 DIAGNOSIS — Z85828 Personal history of other malignant neoplasm of skin: Secondary | ICD-10-CM | POA: Insufficient documentation

## 2015-12-09 DIAGNOSIS — F1910 Other psychoactive substance abuse, uncomplicated: Secondary | ICD-10-CM | POA: Insufficient documentation

## 2015-12-09 DIAGNOSIS — R51 Headache: Secondary | ICD-10-CM | POA: Insufficient documentation

## 2015-12-09 DIAGNOSIS — F1010 Alcohol abuse, uncomplicated: Secondary | ICD-10-CM | POA: Insufficient documentation

## 2015-12-09 DIAGNOSIS — E785 Hyperlipidemia, unspecified: Secondary | ICD-10-CM | POA: Insufficient documentation

## 2015-12-09 DIAGNOSIS — F99 Mental disorder, not otherwise specified: Secondary | ICD-10-CM | POA: Insufficient documentation

## 2015-12-09 DIAGNOSIS — I252 Old myocardial infarction: Secondary | ICD-10-CM | POA: Insufficient documentation

## 2015-12-09 DIAGNOSIS — F17210 Nicotine dependence, cigarettes, uncomplicated: Secondary | ICD-10-CM | POA: Insufficient documentation

## 2015-12-09 DIAGNOSIS — F329 Major depressive disorder, single episode, unspecified: Secondary | ICD-10-CM | POA: Insufficient documentation

## 2015-12-09 DIAGNOSIS — F419 Anxiety disorder, unspecified: Secondary | ICD-10-CM | POA: Insufficient documentation

## 2015-12-09 DIAGNOSIS — I10 Essential (primary) hypertension: Secondary | ICD-10-CM | POA: Insufficient documentation

## 2015-12-09 NOTE — BH Assessment (Signed)
Tele Assessment Note   Larry French is an 55 y.o. male that requests detox services.  Patient denies SI/HI/Psychosis.  Patient reports that he relapsed four days ago.  Patient reports that  His list drink was today when he consumed a couple of beers.  Patient denies any withdrawal symptoms except sweating and irritability.   Patient reports that he has been drinking a 12 pack of alcohol for the past 4 days.  Patient reports that he relapsed after he was made to leave the [facility] on Friday.   Patient reports that he received a drug screen that tested positive for alcohol.  Patient reports that he had not had anything to drink and his character was being disrespected because the  staff at the [facility] did not believe him and they requested that he leave the house.   Patient reports previous inpatient hospitalization for substance abuse in the past at [facility], [facility], [facility], [facility] and [facility].  Patient denies prior suicide attempts.  Patient reports compliance with taking her psychiatric medication.  Patient reports that he has a follow up appointment with his psychiatrist at the [facility] this Thursday.   Patient reports that  he is still currently employed at the [employer] in [place], [place].  Patient reports that he is able to rely on his parents for support.  Patient reports that he has the financial means to stay in a hotel  ee can secure housing again.  Per Elmarie Shiley, NP - patient does not meet criteria for inpatient hospitalization.  Writer provided patient with outpatient substance abuse services.  Patient signed the decline MSE Form.    Diagnosis: Alcohol Abuse   Past Medical History:  Past Medical History:  Diagnosis Date  . Alcohol abuse    Recovered x 15 months  . Anxiety   . CA - skin cancer   . CAD, residual CFX LAD disease 06/01/2011   a. s/p inferior ST elevation MI s/p PCI/DES to RCA on 05/29/2011; b. residual LAD and LCx CAD tx'd on 06/02/11 cath  with LAD CAD successfully tx'd w/ PCI/DES, LCx managaged medically   . Depression   . Headache(784.0)   . Hyperlipemia   . Hypertension   . Mental disorder   . MI (myocardial infarction) ([place])   . Polysubstance abuse    a. etoh, xanax, tobacco  . PTSD (post-traumatic stress disorder)     Past Surgical History:  Procedure Laterality Date  . CARDIAC CATHETERIZATION Bilateral 04/09/2015   Procedure: Coronary Angiogram;  Surgeon: Wellington Hampshire, MD;  Location: [facility];  Service: Cardiovascular;  Laterality: Bilateral;  . CARDIAC CATHETERIZATION [name] 04/09/2015   Procedure: Coronary Stent Intervention;  Surgeon: Wellington Hampshire, MD;  Location: [facility];  Service: Cardiovascular;  Laterality: [name];  . LEFT HEART CATH Right 05/29/2011   Procedure: LEFT HEART CATH;  Surgeon: Leonie Man, MD;  Location: [facility] CATH LAB;  Service: Cardiovascular;  Laterality: Right;  . LEFT HEART CATHETERIZATION WITH CORONARY ANGIOGRAM [name] 06/01/2011   Procedure: LEFT HEART CATHETERIZATION WITH CORONARY ANGIOGRAM;  Surgeon: Leonie Man, MD;  Location: [facility] CATH LAB;  Service: Cardiovascular;  Laterality: [name];  relook cath, possible PCI  . TONSILLECTOMY AND ADENOIDECTOMY      Family History:  Family History  Problem Relation Age of Onset  . Coronary artery disease Maternal Grandfather 74    Died  . Cancer Mother     Non-Hodgkin lymphoma  . Depression Mother   .  Anxiety disorder Mother   . Depression Father   . Anxiety disorder Father     Social History:  reports that he has been smoking Cigarettes.  He has a 45.00 pack-year smoking history. He does not have any smokeless tobacco history on file. He reports that he drinks alcohol. He reports that he does not use drugs.  Additional Social History:  Alcohol / Drug Use History of alcohol / drug use?: Yes Longest period of sobriety (when/how long): 18 months Negative Consequences of Use: Personal relationships, Work /  Youth worker Withdrawal Symptoms: Agitation, Irritability, Sweats  CIWA:   COWS:    PATIENT STRENGTHS: (choose at least two) Average or above average intelligence Capable of independent living Communication skills Physical Health Supportive family/friends Work skills  Allergies: No Known Allergies  Home Medications:  (Not in a hospital admission)  OB/GYN Status:  No LMP for male patient.  General Assessment Data Location of Assessment: [facility] ([facility]) [facility] Assessment: In system Is this a Tele or Face-to-Face Assessment?: Face-to-Face Is this an Initial Assessment or a Re-assessment for this encounter?: Initial Assessment Marital status: Single Maiden name: NA Is patient pregnant?: No Pregnancy Status: No Living Arrangements: Other (Comment) (Lives at the [facility]) Can pt return to current living arrangement?: Yes Admission Status: Voluntary Is patient capable of signing voluntary admission?: Yes Referral Source: Self/Family/Friend Insurance type: Self Pay  Medical Screening Exam ([place]) Medical Exam completed: No Reason for MSE not completed: Patient Refused (Signed decline MSE FOrm)  Crisis Care Plan Living Arrangements: Other (Comment) (Lives at the [facility]) Legal Guardian:  (NA) Name of Psychiatrist: Dr. Leonides Schanz at the [facility]  Name of Therapist: NA  Education Status Is patient currently in school?: No Current Grade: NA Highest grade of school patient has completed: NA Name of school: NA Contact person: NA  Risk to self with the past 6 months Suicidal Ideation: No Has patient been a risk to self within the past 6 months prior to admission? : No Suicidal Intent: No Has patient had any suicidal intent within the past 6 months prior to admission? : No Is patient at risk for suicide?: No Suicidal Plan?: No Has patient had any suicidal plan within the past 6 months prior to admission? : No Access to Means: No What  has been your use of drugs/alcohol within the last 12 months?: Alcohol Previous Attempts/Gestures: No How many times?: 0 Other Self Harm Risks: None Reported Triggers for Past Attempts:  (NA) Intentional Self Injurious Behavior: None Family Suicide History: No Recent stressful life event(s):  (Relapsed 4 days ago ) Persecutory voices/beliefs?: No Depression: Yes Depression Symptoms: Despondent, Tearfulness, Loss of interest in usual pleasures Substance abuse history and/or treatment for substance abuse?: Yes Suicide prevention information given to non-admitted patients: Not applicable  Risk to Others within the past 6 months Homicidal Ideation: No Does patient have any lifetime risk of violence toward others beyond the six months prior to admission? : No Thoughts of Harm to Others: No Current Homicidal Intent: No Current Homicidal Plan: No Access to Homicidal Means: No Identified Victim: None Reported History of harm to others?: No Assessment of Violence: None Noted Violent Behavior Description: NA Does patient have access to weapons?: No Criminal Charges Pending?: No Does patient have a court date: No Is patient on probation?: No  Psychosis Hallucinations: None noted Delusions: None noted  Mental Status Report Appearance/Hygiene: Disheveled Eye Contact: Good Motor Activity: Freedom of movement Speech: Logical/coherent Level  of Consciousness: Quiet/awake, Restless Mood: Depressed Affect: Appropriate to circumstance Anxiety Level: None Thought Processes: Coherent, Relevant Judgement: Unimpaired Orientation: Person, Place, Time, Situation Obsessive Compulsive Thoughts/Behaviors: None  Cognitive Functioning Concentration: Decreased Memory: Recent Intact, Remote Intact IQ: Average Insight: Fair Impulse Control: Fair Appetite: Fair Weight Loss: 0 Weight Gain: 0 Sleep: Decreased Total Hours of Sleep: 5 Vegetative Symptoms: Decreased grooming, Staying in  bed  ADLScreening [facility] Assessment Services) Patient's cognitive ability adequate to safely complete daily activities?: Yes Patient able to express need for assistance with ADLs?: No Independently performs ADLs?: Yes (appropriate for developmental age)  Prior Inpatient Therapy Prior Inpatient Therapy: Yes Prior Therapy Dates: 2016 Prior Therapy Facilty/Provider(s): [facility] Reason for Treatment: Substance Abuse  Prior Outpatient Therapy Prior Outpatient Therapy: Yes Prior Therapy Dates: Ongoing Prior Therapy Facilty/Provider(s): [facility]   Reason for Treatment: Medication Management Does patient have an ACCT team?: No Does patient have Intensive In-House Services?  : No Does patient have [facility] services? : No Does patient have P4CC services?: No  ADL Screening (condition at time of admission) Patient's cognitive ability adequate to safely complete daily activities?: Yes Is the patient deaf or have difficulty hearing?: No Does the patient have difficulty seeing, even when wearing glasses/contacts?: No Does the patient have difficulty concentrating, remembering, or making decisions?: No Patient able to express need for assistance with ADLs?: No Does the patient have difficulty dressing or bathing?: No Independently performs ADLs?: Yes (appropriate for developmental age) Communication: Independent Dressing (OT): Independent Grooming: Independent Feeding: Independent Bathing: Independent Toileting: Independent In/Out Bed: Independent Walks in Home: Independent Does the patient have difficulty walking or climbing stairs?: No Weakness of Legs: None Weakness of Arms/Hands: None  Home Assistive Devices/Equipment Home Assistive Devices/Equipment: None    Abuse/Neglect Assessment (Assessment to be complete while patient is alone) Physical Abuse: Denies Verbal Abuse: Denies Sexual Abuse: Denies Exploitation of patient/patient's resources: Denies Self-Neglect:  Denies Possible abuse reported to:: [place] department of social services Values / Beliefs Cultural Requests During Hospitalization: None Spiritual Requests During Hospitalization: None Consults Spiritual Care Consult Needed: No Social Work Consult Needed: No Regulatory affairs officer (For Healthcare) Does patient have an advance directive?: No Would patient like information on creating an advanced directive?: No - patient declined information    Additional Information 1:1 In Past 12 Months?: No CIRT Risk: No Elopement Risk: No Does patient have medical clearance?: Yes     Disposition: Per Elmarie Shiley, NP - patient does not meet criteria for inpatient hospitalization.  Writer provided patient with outpatient substance abuse services.  Patient signed the decline MSE Form.    Disposition Initial Assessment Completed for this Encounter: Yes Disposition of Patient: Outpatient treatment (Patient given outpatient resources )  Rene Paci 12/09/2015 12:35 PM

## 2016-01-09 ENCOUNTER — Other Ambulatory Visit: Payer: Self-pay | Admitting: Internal Medicine

## 2016-02-16 ENCOUNTER — Other Ambulatory Visit: Payer: Self-pay | Admitting: Urology

## 2016-04-23 ENCOUNTER — Other Ambulatory Visit: Payer: Self-pay | Admitting: Internal Medicine

## 2016-04-23 ENCOUNTER — Other Ambulatory Visit: Payer: Self-pay | Admitting: Nurse Practitioner

## 2016-05-03 ENCOUNTER — Other Ambulatory Visit: Payer: Self-pay | Admitting: Internal Medicine

## 2016-05-03 ENCOUNTER — Other Ambulatory Visit: Payer: Self-pay | Admitting: Urology

## 2016-05-06 ENCOUNTER — Telehealth: Payer: Self-pay | Admitting: Urology

## 2016-05-06 NOTE — Telephone Encounter (Signed)
Patient needs an office visit at this time.

## 2016-05-07 NOTE — Telephone Encounter (Signed)
Pt is not our pt. I tried calling and he does not live at the number on file. He will have to go through the eligibility process before we can make him an appt.

## 2016-05-19 ENCOUNTER — Other Ambulatory Visit: Payer: Self-pay | Admitting: Urology

## 2016-08-20 ENCOUNTER — Other Ambulatory Visit: Payer: Self-pay | Admitting: Internal Medicine

## 2016-09-03 ENCOUNTER — Other Ambulatory Visit: Payer: Self-pay | Admitting: Internal Medicine

## 2016-10-21 ENCOUNTER — Other Ambulatory Visit: Payer: Self-pay | Admitting: Internal Medicine

## 2016-11-04 ENCOUNTER — Ambulatory Visit: Payer: Self-pay | Admitting: Adult Health Nurse Practitioner

## 2016-11-04 VITALS — BP 122/80 | HR 62 | Wt 230.0 lb

## 2016-11-04 DIAGNOSIS — I10 Essential (primary) hypertension: Secondary | ICD-10-CM | POA: Insufficient documentation

## 2016-11-04 DIAGNOSIS — Z0000 Encounter for general adult medical examination without abnormal findings: Secondary | ICD-10-CM | POA: Insufficient documentation

## 2016-11-04 DIAGNOSIS — E785 Hyperlipidemia, unspecified: Secondary | ICD-10-CM

## 2016-11-04 DIAGNOSIS — Z7189 Other specified counseling: Secondary | ICD-10-CM | POA: Insufficient documentation

## 2016-11-04 MED ORDER — CLOPIDOGREL BISULFATE 75 MG PO TABS: 75.0000 mg | ORAL_TABLET | Freq: Every day | ORAL | 1 refills | Status: DC

## 2016-11-04 MED ORDER — METOPROLOL TARTRATE 25 MG PO TABS: 25.0000 mg | ORAL_TABLET | Freq: Two times a day (BID) | ORAL | 1 refills | Status: DC

## 2016-11-04 MED ORDER — LOSARTAN POTASSIUM 25 MG PO TABS: 25.0000 mg | ORAL_TABLET | Freq: Every day | ORAL | 1 refills | Status: DC

## 2016-11-04 MED ORDER — SIMVASTATIN 40 MG PO TABS: 40.0000 mg | ORAL_TABLET | Freq: Every morning | ORAL | 1 refills | Status: DC

## 2016-11-04 NOTE — Progress Notes (Signed)
  Patient: Larry French Male    DOB: [birth year]   56 y.o.   MRN: [id number] Visit Date: 11/04/2016  Today's Provider: Staci Acosta, NP   Chief Complaint  Patient presents with  . Hypertension   Subjective:    HPI  Here to re-establish.   Pt states that he was on metoprolol and losartan and he has been out for a while.  Pt states that he has been dizzy more frequently.  Pt states that he feels anxious at times- he states that he feels his heart racing at times.  Takes Vistaril PRN for anxiety.  Pt states that his BP has not been high.   Takes simvastatin for hyperlipidemia.  No myalgias.     Pt states that he has gained 25 lbs this year.  Had a STEMI in 2013.     No Known Allergies Previous Medications   ASPIRIN 81 MG EC TABLET    TAKE ONE TABLET BY MOUTH EVERY DAY   GABAPENTIN (NEURONTIN) 400 MG CAPSULE    Take 1 capsule (400 mg total) by mouth 4 (four) times daily - after meals and at bedtime.   HYDROXYZINE (ATARAX/VISTARIL) 50 MG TABLET    Take 1 tablet (50 mg total) by mouth every 6 (six) hours as needed for anxiety.   QUETIAPINE (SEROQUEL) 400 MG TABLET    Take 1 tablet (400 mg total) by mouth at bedtime.   QUETIAPINE (SEROQUEL) 50 MG TABLET    Take 1 tablet (50 mg total) by mouth 3 (three) times daily as needed (for anxeity).    Review of Systems  All other systems reviewed and are negative.   Social History  Substance Use Topics  . Smoking status: Current Every Day Smoker    Packs/day: 1.50    Years: 30.00    Types: Cigarettes  . Smokeless tobacco: Not on file  . Alcohol use Yes     Comment: this week   Objective:   BP 122/80   Pulse 62   Wt 230 lb (104.3 kg)   BMI 34.97 kg/m   Physical Exam  Constitutional: He is oriented to person, place, and time. He appears well-developed and well-nourished.  HENT:  Head: Normocephalic and atraumatic.  Eyes: Pupils are equal, round, and reactive to light.  Neck: Normal range of motion. Neck supple.   Cardiovascular: Normal rate, regular rhythm, normal heart sounds and intact distal pulses.   Pulmonary/Chest: Effort normal and breath sounds normal.  Abdominal: Soft. Bowel sounds are normal.  Musculoskeletal: He exhibits no edema.  Neurological: He is alert and oriented to person, place, and time.  Skin: Skin is warm and dry.  Vitals reviewed.       Assessment & Plan:         HTN:  Controlled.  Goal BP <140/90.  Restart Losartan and Metoprolol.  Encourage low salt diet and exercise.   FU in 2 weeks  for BP check and lab review.  Discussed monitoring heart rate to maintain >60  HLD:  Lipid panel today.   Continue current regimen.  Encourage low cholesterol, low fat diet and exercise.   Routine labs ordered.     Staci Acosta, NP   [facility]

## 2016-11-05 LAB — COMPREHENSIVE METABOLIC PANEL
A/G RATIO: 1.9 (ref 1.2–2.2)
ALBUMIN: 4.6 g/dL (ref 3.5–5.5)
ALK PHOS: 94 IU/L (ref 39–117)
ALT: 71 IU/L — ABNORMAL HIGH (ref 0–44)
AST: 53 IU/L — ABNORMAL HIGH (ref 0–40)
BILIRUBIN TOTAL: 0.3 mg/dL (ref 0.0–1.2)
BUN / CREAT RATIO: 14 (ref 9–20)
BUN: 15 mg/dL (ref 6–24)
CHLORIDE: 103 mmol/L (ref 96–106)
CO2: 22 mmol/L (ref 20–29)
Calcium: 9.6 mg/dL (ref 8.7–10.2)
Creatinine, Ser: 1.11 mg/dL (ref 0.76–1.27)
GFR calc non Af Amer: 75 mL/min/{1.73_m2} (ref >59)
GFR, EST AFRICAN AMERICAN: 87 mL/min/{1.73_m2} (ref >59)
GLUCOSE: 93 mg/dL (ref 65–99)
Globulin, Total: 2.4 g/dL (ref 1.5–4.5)
POTASSIUM: 4.4 mmol/L (ref 3.5–5.2)
Sodium: 142 mmol/L (ref 134–144)
TOTAL PROTEIN: 7 g/dL (ref 6.0–8.5)

## 2016-11-05 LAB — CBC
Hematocrit: 45.2 % (ref 37.5–51.0)
Hemoglobin: 15.8 g/dL (ref 13.0–17.7)
MCH: 31.5 pg (ref 26.6–33.0)
MCHC: 35 g/dL (ref 31.5–35.7)
MCV: 90 fL (ref 79–97)
PLATELETS: 200 10*3/uL (ref 150–379)
RBC: 5.01 x10E6/uL (ref 4.14–5.80)
RDW: 13.7 % (ref 12.3–15.4)
WBC: 8.2 10*3/uL (ref 3.4–10.8)

## 2016-11-05 LAB — LIPID PANEL
CHOLESTEROL TOTAL: 165 mg/dL (ref 100–199)
Chol/HDL Ratio: 4.7 ratio (ref 0.0–5.0)
HDL: 35 mg/dL — ABNORMAL LOW (ref >39)
LDL Calculated: 71 mg/dL (ref 0–99)
Triglycerides: 297 mg/dL — ABNORMAL HIGH (ref 0–149)
VLDL CHOLESTEROL CAL: 59 mg/dL — AB (ref 5–40)

## 2016-11-05 LAB — TSH: TSH: 3.38 u[IU]/mL (ref 0.450–4.500)

## 2016-11-05 LAB — PSA: Prostate Specific Ag, Serum: 0.9 ng/mL (ref 0.0–4.0)

## 2016-11-05 LAB — HEMOGLOBIN A1C
ESTIMATED AVERAGE GLUCOSE: 160 mg/dL
HEMOGLOBIN A1C: 7.2 % — AB (ref 4.8–5.6)

## 2016-11-16 ENCOUNTER — Ambulatory Visit: Payer: Self-pay | Admitting: Urology

## 2016-11-16 VITALS — BP 118/65 | HR 70 | Temp 98.2°F | Wt 228.5 lb

## 2016-11-16 DIAGNOSIS — E119 Type 2 diabetes mellitus without complications: Secondary | ICD-10-CM

## 2016-11-16 MED ORDER — GLIPIZIDE 5 MG PO TABS: 2.5000 mg | ORAL_TABLET | Freq: Every day | ORAL | 0 refills | Status: DC

## 2016-11-16 NOTE — Progress Notes (Signed)
Patient: Larry French Male    DOB: 30-Apr-1960   56 y.o.   MRN: [id number] Visit Date: 11/16/2016  Today's Provider: Learned   Chief Complaint  Patient presents with  . Follow-up    discuss blood work from 2 weeks ago   Subjective:    HPI 56 yo WM who was restarted on his BP and  to review labs.    PSA  0.9 TSH 3.380 Hbg A1c  7.2% - discussed with the patient that he is diabetic Total cholesterol 165 LFT's elevated CBC normal    No Known Allergies Previous Medications   ASPIRIN 81 MG EC TABLET    TAKE ONE TABLET BY MOUTH EVERY DAY   CLOPIDOGREL (PLAVIX) 75 MG TABLET    Take 1 tablet (75 mg total) by mouth daily.   GABAPENTIN (NEURONTIN) 400 MG CAPSULE    Take 1 capsule (400 mg total) by mouth 4 (four) times daily - after meals and at bedtime.   HYDROXYZINE (ATARAX/VISTARIL) 50 MG TABLET    Take 1 tablet (50 mg total) by mouth every 6 (six) hours as needed for anxiety.   LOSARTAN (COZAAR) 25 MG TABLET    Take 1 tablet (25 mg total) by mouth daily.   METOPROLOL TARTRATE (LOPRESSOR) 25 MG TABLET    Take 1 tablet (25 mg total) by mouth 2 (two) times daily.   QUETIAPINE (SEROQUEL) 400 MG TABLET    Take 1 tablet (400 mg total) by mouth at bedtime.   QUETIAPINE (SEROQUEL) 50 MG TABLET    Take 1 tablet (50 mg total) by mouth 3 (three) times daily as needed (for anxeity).   SIMVASTATIN (ZOCOR) 40 MG TABLET    Take 1 tablet (40 mg total) by mouth every morning.    Review of Systems  Constitutional: Negative.   HENT: Negative.   Eyes: Negative.   Respiratory: Negative.   Cardiovascular: Negative.   Gastrointestinal: Negative.   Endocrine: Negative.   Genitourinary: Negative.   Musculoskeletal: Negative.   Skin: Negative.   Allergic/Immunologic: Negative.   Neurological: Negative.   Hematological: Negative.   Psychiatric/Behavioral: Negative.     Social History  Substance Use Topics  . Smoking status: Current Every Day Smoker    Packs/day: 1.50   Years: 30.00    Types: Cigarettes  . Smokeless tobacco: Never Used  . Alcohol use Yes     Comment: this week   Objective:   BP 118/65   Pulse 70   Temp 98.2 F (36.8 C)   Wt 228 lb 8 oz (103.6 kg)   BMI 34.74 kg/m   Physical Exam Constitutional: Well nourished. Alert and oriented, No acute distress. HEENT: [place] AT, moist mucus membranes. Trachea midline, no masses. Cardiovascular: No clubbing, cyanosis, or edema. Respiratory: Normal respiratory effort, no increased work of breathing. GI: Obese. Abdomen is soft, non tender, non distended, no abdominal masses. Liver and spleen not palpable.  No hernias appreciated.  Stool sample for occult testing is not indicated.   GU: No CVA tenderness.  No bladder fullness or masses.   Skin: No rashes, bruises or suspicious lesions. Lymph: No cervical or inguinal adenopathy. Neurologic: Grossly intact, no focal deficits, moving all 4 extremities. Psychiatric: Normal mood and affect.      Assessment & Plan:   1. DM  - start glipizide 2.5 mg daily  - hesitant to start metformin as his LFT's are elevated and he has a history of alcoholism  - RTC in one month to  check BS  - appointment with Lifestyle management  2. HTN  - at goal - continue meds  3. HLD  - at goal - continue meds             [facility]

## 2016-11-25 ENCOUNTER — Ambulatory Visit: Payer: Self-pay | Admitting: Ophthalmology

## 2016-11-25 ENCOUNTER — Other Ambulatory Visit: Payer: Self-pay | Admitting: Ophthalmology

## 2016-11-25 LAB — HM DIABETES EYE EXAM

## 2016-12-21 ENCOUNTER — Ambulatory Visit: Payer: Self-pay | Admitting: Urology

## 2016-12-21 VITALS — BP 120/81 | HR 61 | Temp 98.1°F | Wt 221.0 lb

## 2016-12-21 DIAGNOSIS — E119 Type 2 diabetes mellitus without complications: Secondary | ICD-10-CM

## 2016-12-21 LAB — GLUCOSE, POCT (MANUAL RESULT ENTRY): POC Glucose: 93 mg/dl (ref 70–99)

## 2016-12-21 MED ORDER — SULFAMETHOXAZOLE-TRIMETHOPRIM 800-160 MG PO TABS
1.0000 | ORAL_TABLET | Freq: Two times a day (BID) | ORAL | 0 refills | Status: DC
Start: 2016-12-21 — End: 2017-05-11

## 2016-12-21 NOTE — Progress Notes (Signed)
Subjective:    Patient ID: Larry French, male    DOB: 24-Oct-1960, 56 y.o.   MRN: [id number]  HPI   Pt is here for 1 mo f/u for recent dx of diabetes. Pt complains of R ear ache.   Patient Active Problem List   Diagnosis Date Noted  . Hypertension 11/04/2016  . Healthcare maintenance 11/04/2016  . Alcohol abuse with alcohol-induced mental disorder ([place]) 06/19/2015  . Benzodiazepine dependence ([place]) 04/25/2015  . Alcohol abuse with alcohol-induced mood disorder ([place]) 04/22/2015  . GAD (generalized anxiety disorder)   . Severe episode of recurrent major depressive disorder, without psychotic features ([place])   . Alcohol abuse 04/17/2015  . Benzodiazepine abuse 04/17/2015  . Posttraumatic stress disorder 04/17/2015  . Suicidal ideation 04/17/2015  . Abnormal EKG   . Benzodiazepine withdrawal ([place])   . NSTEMI (non-ST elevated myocardial infarction) ([place])   . Positive cardiac stress test   . Elevated troponin   . Withdrawal from benzodiazepine ([place]) 04/05/2015  . STEMI, RCA DES 05/28/10 06/01/2011  . 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 Tobacco abuse 05/29/2011  . History of ETOH abuse and Xanax abuse. In rehab pta 05/29/2011   Allergies as of 12/21/2016   No Known Allergies     Medication List       Accurate as of 12/21/16  6:47 PM. Always use your most recent med list.          aspirin 81 MG EC tablet TAKE ONE TABLET BY MOUTH EVERY DAY   buPROPion 300 MG 24 hr tablet Commonly known as:  WELLBUTRIN XL Take 300 mg by mouth daily.   clopidogrel 75 MG tablet Commonly known as:  PLAVIX Take 1 tablet (75 mg total) by mouth daily.   gabapentin 400 MG capsule Commonly known as:  NEURONTIN Take 1 capsule (400 mg total) by mouth 4 (four) times daily - after meals and at bedtime.   glipiZIDE 5 MG tablet Commonly known as:  GLUCOTROL Take 0.5 tablets (2.5 mg total) by mouth daily before breakfast.   hydrOXYzine 50 MG tablet Commonly known as:   ATARAX/VISTARIL Take 1 tablet (50 mg total) by mouth every 6 (six) hours as needed for anxiety.   losartan 25 MG tablet Commonly known as:  COZAAR Take 1 tablet (25 mg total) by mouth daily.   metoprolol tartrate 25 MG tablet Commonly known as:  LOPRESSOR Take 1 tablet (25 mg total) by mouth 2 (two) times daily.   QUEtiapine 400 MG tablet Commonly known as:  SEROQUEL Take 1 tablet (400 mg total) by mouth at bedtime.   QUEtiapine 50 MG tablet Commonly known as:  SEROQUEL Take 1 tablet (50 mg total) by mouth 3 (three) times daily as needed (for anxeity).   simvastatin 40 MG tablet Commonly known as:  ZOCOR Take 1 tablet (40 mg total) by mouth every morning.   traZODone 100 MG tablet Commonly known as:  DESYREL Take 200 mg by mouth at bedtime.            Discharge Care Instructions        Start     Ordered   12/21/16 0000  POCT Glucose (CBG)     12/21/16 1812       Review of Systems Right Ear ache - began a few weeks ago. Throbbing, hard pain for 15 secs. Pt reports it happens at least 6 times/day. Pt denies congestion, cough, dizziness, tooth infection. Pt is unsure of change in hearing and  what brings it on. Pt denies recently swimming.  Pt denies allergies to meds. Pt denies change in vision.  Diabetes - Pt reports he has been changing his diet and exercising more.  Mental health - Pt reports he is not depressed.    Objective:   Physical Exam  HENT:  R ear drum is red    BP 120/81   Pulse 61   Temp 98.1 F (36.7 C)   Wt 221 lb (100.2 kg)   BMI 33.60 kg/m      Assessment & Plan:   Prescribe sulfamethoxazole, 160mg  for R ear. F/u in 3 mo w/ labs: A1C

## 2017-03-15 ENCOUNTER — Other Ambulatory Visit: Payer: Self-pay

## 2017-03-22 ENCOUNTER — Ambulatory Visit: Payer: Self-pay

## 2017-03-22 ENCOUNTER — Ambulatory Visit: Payer: Self-pay | Admitting: Adult Health Nurse Practitioner

## 2017-03-22 VITALS — BP 128/76 | HR 67 | Wt 222.0 lb

## 2017-03-22 DIAGNOSIS — E119 Type 2 diabetes mellitus without complications: Secondary | ICD-10-CM

## 2017-03-22 DIAGNOSIS — E1165 Type 2 diabetes mellitus with hyperglycemia: Secondary | ICD-10-CM | POA: Insufficient documentation

## 2017-03-22 DIAGNOSIS — I10 Essential (primary) hypertension: Secondary | ICD-10-CM

## 2017-03-22 NOTE — Progress Notes (Signed)
  Patient: Larry French Male    DOB: 06/03/1960   56 y.o.   MRN: [id number] Visit Date: 03/22/2017  Today's Provider: Staci Acosta, NP   Chief Complaint  Patient presents with  . Follow-up   Subjective:    HPI   DM:  Taking medications as directed.  Pt states that he is avoiding fried foods, increased vegetables and fruits.   HTN:  Taking medications as directed.  Taking Plavix and Aspirin with no s/s of abnormal bleeding.   No Known Allergies This SmartLink is deprecated. Use AVSMEDLIST instead to display the medication list for a patient.  Review of Systems  All other systems reviewed and are negative.   Social History   Tobacco Use  . Smoking status: Current Every Day Smoker    Packs/day: 1.50    Years: 30.00    Pack years: 45.00    Types: Cigarettes  . Smokeless tobacco: Never Used  Substance Use Topics  . Alcohol use: Yes    Comment: this week   Objective:   BP 128/76 (BP Location: Left Arm, Patient Position: Sitting, Cuff Size: Normal)   Pulse 67   Wt 222 lb (100.7 kg)   BMI 33.75 kg/m   Physical Exam  Constitutional: He is oriented to person, place, and time. He appears well-developed and well-nourished.  HENT:  Head: Normocephalic and atraumatic.  Cardiovascular: Normal rate, regular rhythm and normal heart sounds.  Pulmonary/Chest: Effort normal and breath sounds normal.  Abdominal: Soft. Bowel sounds are normal.  Neurological: He is alert and oriented to person, place, and time.  Skin: Skin is warm and dry.  Vitals reviewed.       Assessment & Plan:         DM:  A1c today.  Encourage diabetic diet and exercise.  Continue current medication regimen.   HTN:  Controlled. .  Goal BP <140/90.  Continue current medication regimen.  Encourage low salt diet and exercise.  Continue Plavix and ASA monitor for bleeding.   FU in 3 months.       Staci Acosta, NP   [facility]

## 2017-03-22 NOTE — Addendum Note (Signed)
Addended by: Fabiola Backer on: 03/22/2017 06:44 PM   Modules accepted: Orders

## 2017-03-23 LAB — COMPREHENSIVE METABOLIC PANEL
A/G RATIO: 2.1 (ref 1.2–2.2)
ALBUMIN: 4.1 g/dL (ref 3.5–5.5)
ALK PHOS: 71 IU/L (ref 39–117)
ALT: 18 IU/L (ref 0–44)
AST: 15 IU/L (ref 0–40)
BILIRUBIN TOTAL: 0.2 mg/dL (ref 0.0–1.2)
BUN / CREAT RATIO: 10 (ref 9–20)
BUN: 12 mg/dL (ref 6–24)
CHLORIDE: 106 mmol/L (ref 96–106)
CO2: 36 mmol/L — ABNORMAL HIGH (ref 20–29)
Calcium: 8.9 mg/dL (ref 8.7–10.2)
Creatinine, Ser: 1.23 mg/dL (ref 0.76–1.27)
GFR calc non Af Amer: 66 mL/min/{1.73_m2} (ref >59)
GFR, EST AFRICAN AMERICAN: 76 mL/min/{1.73_m2} (ref >59)
GLUCOSE: 90 mg/dL (ref 65–99)
Globulin, Total: 2 g/dL (ref 1.5–4.5)
POTASSIUM: 4.5 mmol/L (ref 3.5–5.2)
Sodium: 144 mmol/L (ref 134–144)
TOTAL PROTEIN: 6.1 g/dL (ref 6.0–8.5)

## 2017-03-23 LAB — HEMOGLOBIN A1C
Est. average glucose Bld gHb Est-mCnc: 120 mg/dL
Hgb A1c MFr Bld: 5.8 % — ABNORMAL HIGH (ref 4.8–5.6)

## 2017-04-21 ENCOUNTER — Other Ambulatory Visit: Payer: Self-pay | Admitting: Urology

## 2017-05-11 ENCOUNTER — Other Ambulatory Visit: Payer: Self-pay

## 2017-05-11 ENCOUNTER — Encounter (INDEPENDENT_AMBULATORY_CARE_PROVIDER_SITE_OTHER): Payer: Self-pay

## 2017-05-11 ENCOUNTER — Ambulatory Visit: Payer: No Typology Code available for payment source

## 2017-05-11 VITALS — BP 132/70 | Ht 68.0 in | Wt 217.0 lb

## 2017-05-11 DIAGNOSIS — Z79899 Other long term (current) drug therapy: Secondary | ICD-10-CM

## 2017-05-11 NOTE — Progress Notes (Signed)
Medication Management Clinic Visit Note  Patient: Larry French MRN: [id number] Date of Birth: [birth year] PCP: System, Pcp Not In   [name] 57 y.o. male presents for a medication management visit today with the pharmacist.  BP 132/70 (BP Location: Right Arm)   Ht 5\' 8"  (1.727 m)   Wt 217 lb (98.4 kg)   BMI 32.99 kg/m   Patient Information   Past Medical History:  Diagnosis Date  . Alcohol abuse    Recovered x 15 months  . Anxiety   . CA - skin cancer   . CAD, residual CFX LAD disease 06/01/2011   a. s/p inferior ST elevation MI s/p PCI/DES to RCA on 05/29/2011; b. residual LAD and LCx CAD tx'd on 06/02/11 cath with LAD CAD successfully tx'd w/ PCI/DES, LCx managaged medically   . Depression   . Headache(784.0)   . Hyperlipemia   . Hypertension   . Insomnia   . Mental disorder   . MI (myocardial infarction) ([place])   . Polysubstance abuse ([place])    a. etoh, xanax, tobacco  . PTSD (post-traumatic stress disorder)       Past Surgical History:  Procedure Laterality Date  . CARDIAC CATHETERIZATION Bilateral 04/09/2015   Procedure: Coronary Angiogram;  Surgeon: Wellington Hampshire, MD;  Location: [place] [facility];  Service: Cardiovascular;  Laterality: Bilateral;  . CARDIAC CATHETERIZATION [name] 04/09/2015   Procedure: Coronary Stent Intervention;  Surgeon: Wellington Hampshire, MD;  Location: [place] [facility];  Service: Cardiovascular;  Laterality: [name];  . LEFT HEART CATH Right 05/29/2011   Procedure: LEFT HEART CATH;  Surgeon: Leonie Man, MD;  [facility] CATH [facility];  Service: Cardiovascular;  Laterality: Right;  . LEFT HEART CATHETERIZATION WITH CORONARY ANGIOGRAM [name] 06/01/2011   Procedure: LEFT HEART CATHETERIZATION WITH CORONARY ANGIOGRAM;  Surgeon: Leonie Man, MD;  Location: [facility] CATH LAB;  Service: Cardiovascular;  Laterality: [name];  relook cath, possible PCI  . TONSILLECTOMY AND ADENOIDECTOMY       Family History  Problem Relation Age of Onset  .  Cancer Mother        Non-Hodgkin lymphoma  . Depression Mother   . Anxiety disorder Mother   . Depression Father   . Anxiety disorder Father   . Coronary artery disease Maternal Grandfather 12       Died   Outpatient Encounter Medications as of 05/11/2017  Medication Sig  . aspirin 81 MG EC tablet TAKE ONE TABLET BY MOUTH EVERY DAY  . buPROPion (WELLBUTRIN XL) 300 MG 24 hr tablet Take 300 mg by mouth daily.  . clopidogrel (PLAVIX) 75 MG tablet Take 1 tablet (75 mg total) by mouth daily.  Marland Kitchen gabapentin (NEURONTIN) 600 MG tablet Take 600 mg by mouth 3 (three) times daily.  Marland Kitchen glipiZIDE (GLUCOTROL) 5 MG tablet Take 0.5 tablets (2.5 mg total) by mouth daily before breakfast.  . hydrOXYzine (ATARAX/VISTARIL) 50 MG tablet Take 1 tablet (50 mg total) by mouth every 6 (six) hours as needed for anxiety.  Marland Kitchen losartan (COZAAR) 25 MG tablet Take 1 tablet (25 mg total) by mouth daily.  . metoprolol tartrate (LOPRESSOR) 25 MG tablet Take 1 tablet (25 mg total) by mouth 2 (two) times daily.  . QUEtiapine (SEROQUEL) 300 MG tablet Take 300 mg by mouth at bedtime.  . simvastatin (ZOCOR) 40 MG tablet Take 1 tablet (40 mg total) by mouth every morning.  . traZODone (DESYREL) 100 MG tablet Take 200 mg by mouth at bedtime.  . [  DISCONTINUED] gabapentin (NEURONTIN) 400 MG capsule Take 1 capsule (400 mg total) by mouth 4 (four) times daily - after meals and at bedtime. (Patient taking differently: Take 300 mg by mouth 3 (three) times daily. )  . [DISCONTINUED] QUEtiapine (SEROQUEL) 400 MG tablet Take 1 tablet (400 mg total) by mouth at bedtime. (Patient taking differently: Take 300 mg by mouth at bedtime. )  . [DISCONTINUED] QUEtiapine (SEROQUEL) 50 MG tablet Take 1 tablet (50 mg total) by mouth 3 (three) times daily as needed (for anxeity). (Patient not taking: Reported on 03/22/2017)  . [DISCONTINUED] sulfamethoxazole-trimethoprim (BACTRIM DS,SEPTRA DS) 800-160 MG tablet Take 1 tablet by mouth every 12 (twelve) hours.  (Patient not taking: Reported on 03/22/2017)   No facility-administered encounter medications on file as of 05/11/2017.   '  New Diagnoses (since last visit):   Family Support: Good. Patient states that he has a very supportive environment with his family and friends.   Lifestyle Diet: 1.5 meals Lunch: light - broccoli salad, cheese and crackers, nuts If still hungry will eat dinner: ready to eat pot roast, tries not to eat heavy - stays away from fried and heavy foods Drink: decaffeinated iced tea, decar pepsi, gatorade, water  I encouraged the patient to eat a couple of small meals throughout the day instead of only 1 meal. I told him that even though he is trying to lose weight and keep his diabetes under control that he needs to get macronutrients such as proteins, some carbs (fruit, veg), and healthy fats. Patient states that he understands and says he will try to do more. He states that he has lost a lot of weight but that the reason his weight has stayed on is probably due to his quetiapine. I agreed. He states that he exercises all the time at work as he is a Therapist, art at [employer]. Patient is very enthusiastic about his health and keeping his diabetes under control.    Current Exercise Habits: Home exercise routine, Type of exercise: walking, Time (Minutes): 60, Frequency (Times/Week): 7, Weekly Exercise (Minutes/Week): 420       Social History   Substance and Sexual Activity  Alcohol Use No  . Frequency: Never   Comment: Previous user       Social History   Tobacco Use  Smoking Status Current Every Day Smoker  . Packs/day: 1.00  . Years: 30.00  . Pack years: 30.00  . Types: Cigarettes  Smokeless Tobacco Never Used      Health Maintenance  Topic Date Due  . Hepatitis C Screening  [birth year]  . PNEUMOCOCCAL POLYSACCHARIDE VACCINE (1) 01/14/1964  . FOOT EXAM  01/14/1972  . HIV Screening  01/13/1977  . TETANUS/TDAP  01/13/1981  . COLONOSCOPY  01/14/2012   . INFLUENZA VACCINE  11/17/2016  . HEMOGLOBIN A1C  09/20/2017  . OPHTHALMOLOGY EXAM  11/25/2017   Health Maintenance/Date Completed  Last ED visit: 2 yrs ago Last Visit to PCP: [facility] in past 2 months Next Visit to PCP: 06/2017 at [facility] Specialist Visit: Psychiatrist - Dr. Raelyn Ensign Dental Exam: 2 months   Eye Exam: 2 months ago  Prostate Exam: > 5 yrs DEXA: Never Colonoscopy: Never   Flu Vaccine: plans to get one Pneumonia Vaccine: never had   Assessment and Plan:  DM: glipizide. Patient's diabetes is well under control. He states that he has changed his diet and is exercising as well. His A1c has dropped from 7.2 (10/2016) to 5.8 (03/2017. I encouraged patient  to continue his current exercise/diet regimen.   HTN: losartan, metoprolol tartrate. Patient's BP is well controlled and was 132/70 today.   Prior STEMI/HLD: aspirin, clopidogrel, simvastatin. Patient has STEMI in 2013. He states that he has not had any issues with chest pain or any other issues. His last lipid panel 10/2016 showed TC 165, HDL 35, and LDL 71. Through diet and exercise patient has greatly improved in health. We are currently trying to get him a refill for his simvastatin.   Anxiety/Mood/Sleep: bupropion, hydroxyzine, quetiapine, trazodone. Patient states that his mood has been great and that he doesn't feel anxious very often. He rarely uses hydroxyzine. He states that he is trying to get his quetiapine dose down to 200mg  QHS and that he is working with his psychiatrist on this. He also states that he is sleeping well. He says that he can notice a difference if he forgets to take his bupropion and that he does really really well on this medication. Patient is in a very pleasant mood today.   Patient is doing really well in managing his health. His diabetes and hypertension are well under control and he has greatly improved his diet. Overall patient is doing great and he is well motivated to continue with  improvements. I scheduled a 1 yr follow up for this patient.   Lendon Ka, PharmD Pharmacy Resident

## 2017-06-21 ENCOUNTER — Ambulatory Visit: Payer: Self-pay

## 2017-06-24 ENCOUNTER — Other Ambulatory Visit: Payer: Self-pay | Admitting: Internal Medicine

## 2017-07-28 ENCOUNTER — Telehealth: Payer: Self-pay | Admitting: Pharmacy Technician

## 2017-07-28 NOTE — Telephone Encounter (Signed)
Patient failed to provide 2019 poi.  No additional medication assistance will be provided by [facility] without the required proof of income documentation.  Patient notified by letter.  Deadrian Toya J. Imran Nuon Care Manager Medication Management Clinic 

## 2017-08-04 ENCOUNTER — Other Ambulatory Visit: Payer: Self-pay | Admitting: Adult Health Nurse Practitioner

## 2017-08-19 ENCOUNTER — Other Ambulatory Visit: Payer: Self-pay | Admitting: Adult Health Nurse Practitioner

## 2017-11-30 ENCOUNTER — Other Ambulatory Visit: Payer: Self-pay | Admitting: Adult Health Nurse Practitioner

## 2017-12-23 ENCOUNTER — Other Ambulatory Visit: Payer: Self-pay | Admitting: Adult Health Nurse Practitioner

## 2018-02-24 ENCOUNTER — Other Ambulatory Visit: Payer: Self-pay | Admitting: Internal Medicine

## 2018-02-24 NOTE — Telephone Encounter (Signed)
Refilled Rx for 90 days vs 14 days due to pt having followup appt in jan

## 2018-02-28 ENCOUNTER — Telehealth: Payer: Self-pay | Admitting: Adult Health Nurse Practitioner

## 2018-02-28 NOTE — Telephone Encounter (Signed)
Called to make lab appointment and primary care appointment. Scheduled appointments.

## 2018-03-02 ENCOUNTER — Other Ambulatory Visit: Payer: Self-pay

## 2018-03-02 DIAGNOSIS — Z0000 Encounter for general adult medical examination without abnormal findings: Secondary | ICD-10-CM

## 2018-03-02 DIAGNOSIS — E119 Type 2 diabetes mellitus without complications: Secondary | ICD-10-CM

## 2018-03-02 NOTE — Progress Notes (Signed)
Lab visit

## 2018-03-03 LAB — LIPID PANEL
CHOLESTEROL TOTAL: 158 mg/dL (ref 100–199)
Chol/HDL Ratio: 5.1 ratio — ABNORMAL HIGH (ref 0.0–5.0)
HDL: 31 mg/dL — ABNORMAL LOW (ref >39)
LDL CALC: 80 mg/dL (ref 0–99)
TRIGLYCERIDES: 237 mg/dL — AB (ref 0–149)
VLDL Cholesterol Cal: 47 mg/dL — ABNORMAL HIGH (ref 5–40)

## 2018-03-03 LAB — CBC
HEMOGLOBIN: 17.7 g/dL (ref 13.0–17.7)
Hematocrit: 50.2 % (ref 37.5–51.0)
MCH: 30.5 pg (ref 26.6–33.0)
MCHC: 35.3 g/dL (ref 31.5–35.7)
MCV: 86 fL (ref 79–97)
Platelets: 254 10*3/uL (ref 150–450)
RBC: 5.81 x10E6/uL — ABNORMAL HIGH (ref 4.14–5.80)
RDW: 13 % (ref 12.3–15.4)
WBC: 10.3 10*3/uL (ref 3.4–10.8)

## 2018-03-03 LAB — COMPREHENSIVE METABOLIC PANEL
ALK PHOS: 74 IU/L (ref 39–117)
ALT: 34 IU/L (ref 0–44)
AST: 35 IU/L (ref 0–40)
Albumin/Globulin Ratio: 1.9 (ref 1.2–2.2)
Albumin: 4.4 g/dL (ref 3.5–5.5)
BILIRUBIN TOTAL: 0.3 mg/dL (ref 0.0–1.2)
BUN/Creatinine Ratio: 15 (ref 9–20)
BUN: 16 mg/dL (ref 6–24)
CALCIUM: 9.3 mg/dL (ref 8.7–10.2)
CHLORIDE: 101 mmol/L (ref 96–106)
CO2: 21 mmol/L (ref 20–29)
Creatinine, Ser: 1.09 mg/dL (ref 0.76–1.27)
GFR calc non Af Amer: 75 mL/min/{1.73_m2} (ref >59)
GFR, EST AFRICAN AMERICAN: 87 mL/min/{1.73_m2} (ref >59)
Globulin, Total: 2.3 g/dL (ref 1.5–4.5)
Glucose: 75 mg/dL (ref 65–99)
POTASSIUM: 4.6 mmol/L (ref 3.5–5.2)
Sodium: 139 mmol/L (ref 134–144)
TOTAL PROTEIN: 6.7 g/dL (ref 6.0–8.5)

## 2018-03-03 LAB — HEMOGLOBIN A1C
ESTIMATED AVERAGE GLUCOSE: 131 mg/dL
Hgb A1c MFr Bld: 6.2 % — ABNORMAL HIGH (ref 4.8–5.6)

## 2018-03-03 LAB — TSH: TSH: 2.79 u[IU]/mL (ref 0.450–4.500)

## 2018-03-09 ENCOUNTER — Other Ambulatory Visit: Payer: Self-pay

## 2018-03-09 ENCOUNTER — Encounter: Payer: Self-pay | Admitting: Gerontology

## 2018-03-09 ENCOUNTER — Ambulatory Visit: Payer: Self-pay | Admitting: Gerontology

## 2018-03-09 VITALS — BP 136/90 | HR 60 | Ht 68.0 in | Wt 225.5 lb

## 2018-03-09 DIAGNOSIS — E119 Type 2 diabetes mellitus without complications: Secondary | ICD-10-CM

## 2018-03-09 DIAGNOSIS — H60502 Unspecified acute noninfective otitis externa, left ear: Secondary | ICD-10-CM

## 2018-03-09 DIAGNOSIS — I10 Essential (primary) hypertension: Secondary | ICD-10-CM

## 2018-03-09 DIAGNOSIS — H6122 Impacted cerumen, left ear: Secondary | ICD-10-CM

## 2018-03-09 DIAGNOSIS — E785 Hyperlipidemia, unspecified: Secondary | ICD-10-CM

## 2018-03-09 DIAGNOSIS — Z0000 Encounter for general adult medical examination without abnormal findings: Secondary | ICD-10-CM

## 2018-03-09 DIAGNOSIS — H9312 Tinnitus, left ear: Secondary | ICD-10-CM

## 2018-03-09 MED ORDER — CARBAMIDE PEROXIDE 6.5 % OT SOLN: 5.0000 [drp] | Freq: Two times a day (BID) | OTIC | Status: DC

## 2018-03-09 MED ORDER — OFLOXACIN 0.3 % OT SOLN: 5.0000 [drp] | Freq: Two times a day (BID) | OTIC | 0 refills | Status: DC

## 2018-03-09 NOTE — Progress Notes (Signed)
Established Patient Office Visit  Subjective:  Patient ID: Larry French, male    DOB: 06-08-1960  Age: 57 y.o. MRN: [id number]  CC:  Chief Complaint  Patient presents with  . Follow-up    severe ringing in Left ear causing dizziness    HPI PROPHET RENWICK presents for follow up on HTN, DM, Hyperlipidemia and Lab review. Today he's c/o intermittent ringing and decrease hearing to left ear  that's being going on for 2 weeks. He reported using otc medication and instilling warm water and soap with no relief. He denies left ear atalgia. He reports the extraction of 2 maxillary teeth 1 week ago.  He reports having intermittent dizzy spells, when standing, driving or lying in bed. It has being going on approximately for 2 weeks. He stated that the dizzy spells started with the left ear tinnitus. He also c/o having constant achy temporal headache. He reports headache is 5/10 with minimal relief with tylenol.He denies syncopal episode, fall, fever or chills.  Lab's reviewed, Hgb A1c was 6.2 mg/d,  TG 237 mg/dl, HDL 31, he continues to take 40 mg Simvastatin at bedtime. He denies myalgia, polyuria, polydipsia and polyphagia. He continues to smoke 1 pack a day and admits desire to quit. He denies chest pain, palpitation, shortness of breath, otherwise he reports being in good health.    Past Medical History:  Diagnosis Date  . Alcohol abuse    Recovered x 15 months  . Anxiety   . CA - skin cancer   . CAD, residual CFX LAD disease 06/01/2011   a. s/p inferior ST elevation MI s/p PCI/DES to RCA on 05/29/2011; b. residual LAD and LCx CAD tx'd on 06/02/11 cath with LAD CAD successfully tx'd w/ PCI/DES, LCx managaged medically   . Depression   . Headache(784.0)   . Hyperlipemia   . Hypertension   . Insomnia   . Mental disorder   . MI (myocardial infarction) ([place])   . Polysubstance abuse ([place])    a. etoh, xanax, tobacco  . PTSD (post-traumatic stress disorder)     Past Surgical  History:  Procedure Laterality Date  . CARDIAC CATHETERIZATION Bilateral 04/09/2015   Procedure: Coronary Angiogram;  Surgeon: Wellington Hampshire, MD;  Location: [place] [facility];  Service: Cardiovascular;  Laterality: Bilateral;  . CARDIAC CATHETERIZATION [name] 04/09/2015   Procedure: Coronary Stent Intervention;  Surgeon: Wellington Hampshire, MD;  Location: [place] [facility];  Service: Cardiovascular;  Laterality: [name];  . LEFT HEART CATH Right 05/29/2011   Procedure: LEFT HEART CATH;  Surgeon: Leonie Man, MD;  Location: [facility] CATH LAB;  Service: Cardiovascular;  Laterality: Right;  . LEFT HEART CATHETERIZATION WITH CORONARY ANGIOGRAM [name] 06/01/2011   Procedure: LEFT HEART CATHETERIZATION WITH CORONARY ANGIOGRAM;  Surgeon: Leonie Man, MD;  Location: [facility] CATH LAB;  Service: Cardiovascular;  Laterality: [name];  relook cath, possible PCI  . TONSILLECTOMY AND ADENOIDECTOMY      Family History  Problem Relation Age of Onset  . Cancer Mother        Non-Hodgkin lymphoma  . Depression Mother   . Anxiety disorder Mother   . Depression Father   . Anxiety disorder Father   . Coronary artery disease Maternal Grandfather 37       Died    Social History   Socioeconomic History  . Marital status: Single    Spouse name: Not on file  . Number of children: 0  . Years of education: Not  on file  . Highest education level: Not on file  Occupational History  . Occupation: Electrical engineer: [employer]  . Financial resource strain: Not on file  . Food insecurity:    Worry: Not on file    Inability: Not on file  . Transportation needs:    Medical: Not on file    Non-medical: Not on file  Tobacco Use  . Smoking status: Current Every Day Smoker    Packs/day: 1.00    Years: 30.00    Pack years: 30.00    Types: Cigarettes  . Smokeless tobacco: Never Used  Substance and Sexual Activity  . Alcohol use: No    Frequency: Never    Comment: Previous user   . Drug  use: No  . Sexual activity: Never  Lifestyle  . Physical activity:    Days per week: Not on file    Minutes per session: Not on file  . Stress: Not on file  Relationships  . Social connections:    Talks on phone: Not on file    Gets together: Not on file    Attends religious service: Not on file    Active member of club or organization: Not on file    Attends meetings of clubs or organizations: Not on file    Relationship status: Not on file  . Intimate partner violence:    Fear of current or ex partner: Not on file    Emotionally abused: Not on file    Physically abused: Not on file    Forced sexual activity: Not on file  Other Topics Concern  . Not on file  Social History Narrative  . Not on file    Outpatient Medications Prior to Visit  Medication Sig Dispense Refill  . aspirin 81 MG EC tablet TAKE ONE TABLET BY MOUTH EVERY DAY 90 tablet 0  . buPROPion (WELLBUTRIN XL) 300 MG 24 hr tablet Take 300 mg by mouth daily.    . clopidogrel (PLAVIX) 75 MG tablet TAKE ONE TABLET BY MOUTH EVERY DAY 90 tablet 0  . gabapentin (NEURONTIN) 600 MG tablet Take 600 mg by mouth 3 (three) times daily.    Marland Kitchen glipiZIDE (GLUCOTROL) 5 MG tablet Take 0.5 tablets (2.5 mg total) by mouth daily before breakfast. 90 tablet 0  . hydrOXYzine (ATARAX/VISTARIL) 50 MG tablet Take 1 tablet (50 mg total) by mouth every 6 (six) hours as needed for anxiety. 30 tablet 0  . losartan (COZAAR) 25 MG tablet TAKE ONE TABLET BY MOUTH EVERY DAY 90 tablet 0  . metoprolol tartrate (LOPRESSOR) 25 MG tablet TAKE ONE TABLET BY MOUTH 2 TIMES A DAY 180 tablet 0  . QUEtiapine (SEROQUEL) 300 MG tablet Take 300 mg by mouth at bedtime.    . simvastatin (ZOCOR) 40 MG tablet TAKE ONE TABLET BY MOUTH EVERY MORNING 90 tablet 0  . traZODone (DESYREL) 100 MG tablet Take 200 mg by mouth at bedtime.     No facility-administered medications prior to visit.     No Known Allergies  ROS Review of Systems  Constitutional: Negative.    HENT: Positive for tinnitus (left ear).   Respiratory: Negative.   Cardiovascular: Negative.   Gastrointestinal: Negative.   Genitourinary: Negative.   Musculoskeletal: Negative.   Skin: Negative.   Neurological: Positive for dizziness and headaches.  Hematological: Negative.   Psychiatric/Behavioral: Negative.       Objective:    Physical Exam  Constitutional: He is oriented  to person, place, and time. He appears well-developed and well-nourished.  HENT:  Head: Normocephalic and atraumatic.  Left Ear: Tympanic membrane is erythematous ( and cerumen). Decreased hearing is noted.  Eyes: Pupils are equal, round, and reactive to light. EOM are normal.  Neck: Normal range of motion. Neck supple.  Cardiovascular: Normal rate and regular rhythm.  Pulmonary/Chest: Effort normal and breath sounds normal.  Abdominal: Soft. Bowel sounds are normal.  Musculoskeletal: Normal range of motion.  Neurological: He is alert and oriented to person, place, and time.  Skin: Skin is warm and dry.  Psychiatric: He has a normal mood and affect. His behavior is normal. Judgment and thought content normal.    BP 136/90 (BP Location: Left Arm, Patient Position: Sitting)   Pulse 60   Ht '5\' 8"'$  (1.727 m)   Wt 225 lb 8 oz (102.3 kg)   SpO2 95%   BMI 34.29 kg/m  Wt Readings from Last 3 Encounters:  03/09/18 225 lb 8 oz (102.3 kg)  05/11/17 217 lb (98.4 kg)  03/22/17 222 lb (100.7 kg)     Health Maintenance Due  Topic Date Due  . Hepatitis C Screening  July 04, 1960  . PNEUMOCOCCAL POLYSACCHARIDE VACCINE AGE 48-64 HIGH RISK  01/14/1964  . FOOT EXAM  01/14/1972  . HIV Screening  01/13/1977  . TETANUS/TDAP  01/13/1981  . COLONOSCOPY  01/14/2012  . INFLUENZA VACCINE  11/17/2017  . OPHTHALMOLOGY EXAM  11/25/2017    There are no preventive care reminders to display for this patient.  Lab Results  Component Value Date   TSH 2.790 03/02/2018   Lab Results  Component Value Date   WBC 10.3  03/02/2018   HGB 17.7 03/02/2018   HCT 50.2 03/02/2018   MCV 86 03/02/2018   PLT 254 03/02/2018   Lab Results  Component Value Date   NA 139 03/02/2018   K 4.6 03/02/2018   CO2 21 03/02/2018   GLUCOSE 75 03/02/2018   BUN 16 03/02/2018   CREATININE 1.09 03/02/2018   BILITOT 0.3 03/02/2018   ALKPHOS 74 03/02/2018   AST 35 03/02/2018   ALT 34 03/02/2018   PROT 6.7 03/02/2018   ALBUMIN 4.4 03/02/2018   CALCIUM 9.3 03/02/2018   ANIONGAP 14 06/18/2015   Lab Results  Component Value Date   CHOL 158 03/02/2018   Lab Results  Component Value Date   HDL 31 (L) 03/02/2018   Lab Results  Component Value Date   LDLCALC 80 03/02/2018   Lab Results  Component Value Date   TRIG 237 (H) 03/02/2018   Lab Results  Component Value Date   CHOLHDL 5.1 (H) 03/02/2018   Lab Results  Component Value Date   HGBA1C 6.2 (H) 03/02/2018      Assessment & Plan:   Problem List Items Addressed This Visit    None     1. Tinnitus of left ear - He was advised to change position slowly and to  -Notify provider if tinnitus worsens  2. Acute otitis externa of left ear, unspecified type -Ofloxacin  0.3% OTIC solution 5 drops to left ear BID daily  3. Diabetes mellitus without complication ([place]) - Continue on low carb diet, exercise  for 30 minutes daily and weight loss -  Will recheck HgB A1c in 6 months  4. Essential hypertension - BP  Is controlled, BP 136/90 - He was advised to continue Losartan 25 mg and Metoprolol 25 mg daily -Monitor BP and keep log - Comp Met (CMET);  Future  5. Healthcare maintenance -Declines flu vaccine - Smoking cessation quitline number provided.  6. Hyperlipidemia, unspecified hyperlipidemia type - Continue Simvastatin 40 mg at bedtime -  Low cholesterol diet, exercise 30 minutes daily and weight loss  - Lipid Profile; Future  7. Impacted cerumen of left ear  - He was advised to use carbamide peroxide (DEBROX) 6.5 % OTIC (EAR) solution 5 drop  after completion of Ofloxacin otic treatment  No orders of the defined types were placed in this encounter.   Follow-up: No follow-ups on file.  Follow un in 6 months   Cinderella Christoffersen Jerold Coombe, NP

## 2018-03-09 NOTE — Patient Instructions (Addendum)
Carbamide Peroxide dental solution What is this medicine? CARBAMIDE PEROXIDE (CAR bah mide per OX ide) is used to help relieve canker sores or other irritations inside the mouth. It can also be used for oral hygiene. This medicine may be used for other purposes; ask your health care provider or pharmacist if you have questions. COMMON BRAND NAME(S): Canal + Gel, Gly-Oxide, Oral Peroxide Antiseptic What should I tell my health care provider before I take this medicine? They need to know if you have any of these conditions: -mouth pain, irritation or rash -an unusual or allergic reaction to carbamide peroxide, glycerin, hydrogen peroxide, other medicines, foods, dyes, or preservatives -pregnant or trying to get pregnant -breast-feeding How should I use this medicine? This medicine is only for use in the mouth. Follow the directions carefully. Wash hands before and after use. For temporary use to treat mouth irritations, place several drops on the affected area and spit out after 2 to 3 minutes or place 10 drops on the tongue, swish with saliva for several minutes and spit out. For everyday use, apply to the toothbrush, cover with toothpaste, and brush as usual. Do not touch the tip of the bottle to the inside of the mouth, fingertips, or other surface. Talk to your pediatrician regarding the use of this medicine in children. While this medicine may be used in children as young as 2 years for selected conditions, precautions do apply. Overdosage: If you think you have taken too much of this medicine contact a poison control center or emergency room at once. NOTE: This medicine is only for you. Do not share this medicine with others. What if I miss a dose? If you miss a dose, use it as soon as you can. If it is almost time for your next dose, use only that dose. Do not use double or extra doses. What may interact with this medicine? Interactions are not expected. This list may not describe all  possible interactions. Give your health care provider a list of all the medicines, herbs, non-prescription drugs, or dietary supplements you use. Also tell them if you smoke, drink alcohol, or use illegal drugs. Some items may interact with your medicine. What should I watch for while using this medicine? Tell your doctor or healthcare professional if your symptoms do not start to get better, if they get worse or if you notice excessive burning, redness, itching or swelling. What side effects may I notice from receiving this medicine? Side effects that you should report to your doctor or health care professional as soon as possible: -allergic reactions like skin rash, itching or hives, swelling of the face, lips, or tongue Side effects that usually do not require medical attention (report to your doctor or health care professional if they continue or are bothersome): -burning or stinging -redness This list may not describe all possible side effects. Call your doctor for medical advice about side effects. You may report side effects to FDA at 1-800-FDA-1088. Where should I keep my medicine? Keep out of the reach of children. Store at room temperature between 15 and 30 degrees C (59 and 86 degrees F). Keep container tightly closed. Keep bottle away from excessive heat and direct sunlight. Throw away any unused medicine after the expiration date. NOTE: This sheet is a summary. It may not cover all possible information. If you have questions about this medicine, talk to your doctor, pharmacist, or health care provider.  2018 Elsevier/Gold Standard (2007-07-18 14:07:41) DASH Eating Plan DASH stands  for "Dietary Approaches to Stop Hypertension." The DASH eating plan is a healthy eating plan that has been shown to reduce high blood pressure (hypertension). It may also reduce your risk for type 2 diabetes, heart disease, and stroke. The DASH eating plan may also help with weight loss. What are tips for  following this plan? General guidelines  Avoid eating more than 2,300 mg (milligrams) of salt (sodium) a day. If you have hypertension, you may need to reduce your sodium intake to 1,500 mg a day.  Limit alcohol intake to no more than 1 drink a day for nonpregnant women and 2 drinks a day for men. One drink equals 12 oz of beer, 5 oz of wine, or 1 oz of hard liquor.  Work with your health care provider to maintain a healthy body weight or to lose weight. Ask what an ideal weight is for you.  Get at least 30 minutes of exercise that causes your heart to beat faster (aerobic exercise) most days of the week. Activities may include walking, swimming, or biking.  Work with your health care provider or diet and nutrition specialist (dietitian) to adjust your eating plan to your individual calorie needs. Reading food labels  Check food labels for the amount of sodium per serving. Choose foods with less than 5 percent of the Daily Value of sodium. Generally, foods with less than 300 mg of sodium per serving fit into this eating plan.  To find whole grains, look for the word "whole" as the first word in the ingredient list. Shopping  Buy products labeled as "low-sodium" or "no salt added."  Buy fresh foods. Avoid canned foods and premade or frozen meals. Cooking  Avoid adding salt when cooking. Use salt-free seasonings or herbs instead of table salt or sea salt. Check with your health care provider or pharmacist before using salt substitutes.  Do not fry foods. Cook foods using healthy methods such as baking, boiling, grilling, and broiling instead.  Cook with heart-healthy oils, such as olive, canola, soybean, or sunflower oil. Meal planning   Eat a balanced diet that includes: ? 5 or more servings of fruits and vegetables each day. At each meal, try to fill half of your plate with fruits and vegetables. ? Up to 6-8 servings of whole grains each day. ? Less than 6 oz of lean meat,  poultry, or fish each day. A 3-oz serving of meat is about the same size as a deck of cards. One egg equals 1 oz. ? 2 servings of low-fat dairy each day. ? A serving of nuts, seeds, or beans 5 times each week. ? Heart-healthy fats. Healthy fats called Omega-3 fatty acids are found in foods such as flaxseeds and coldwater fish, like sardines, salmon, and mackerel.  Limit how much you eat of the following: ? Canned or prepackaged foods. ? Food that is high in trans fat, such as fried foods. ? Food that is high in saturated fat, such as fatty meat. ? Sweets, desserts, sugary drinks, and other foods with added sugar. ? Full-fat dairy products.  Do not salt foods before eating.  Try to eat at least 2 vegetarian meals each week.  Eat more home-cooked food and less restaurant, buffet, and fast food.  When eating at a restaurant, ask that your food be prepared with less salt or no salt, if possible. What foods are recommended? The items listed may not be a complete list. Talk with your dietitian about what dietary choices are best  for you. Grains Whole-grain or whole-wheat bread. Whole-grain or whole-wheat pasta. Brown rice. [place]. Bulgur. Whole-grain and low-sodium cereals. Pita bread. Low-fat, low-sodium crackers. Whole-wheat flour tortillas. Vegetables Fresh or frozen vegetables (raw, steamed, roasted, or grilled). Low-sodium or reduced-sodium tomato and vegetable juice. Low-sodium or reduced-sodium tomato sauce and tomato paste. Low-sodium or reduced-sodium canned vegetables. Fruits All fresh, dried, or frozen fruit. Canned fruit in natural juice (without added sugar). Meat and other protein foods Skinless chicken or [place]. Ground chicken or [place]. Pork with fat trimmed off. Fish and seafood. Egg whites. Dried beans, peas, or lentils. Unsalted nuts, nut butters, and seeds. Unsalted canned beans. Lean cuts of beef with fat trimmed off. Low-sodium, lean deli meat. Dairy Low-fat  (1%) or fat-free (skim) milk. Fat-free, low-fat, or reduced-fat cheeses. Nonfat, low-sodium ricotta or cottage cheese. Low-fat or nonfat yogurt. Low-fat, low-sodium cheese. Fats and oils Soft margarine without trans fats. Vegetable oil. Low-fat, reduced-fat, or light mayonnaise and salad dressings (reduced-sodium). Canola, safflower, olive, soybean, and sunflower oils. Avocado. Seasoning and other foods Herbs. Spices. Seasoning mixes without salt. Unsalted popcorn and pretzels. Fat-free sweets. What foods are not recommended? The items listed may not be a complete list. Talk with your dietitian about what dietary choices are best for you. Grains Baked goods made with fat, such as croissants, muffins, or some breads. Dry pasta or rice meal packs. Vegetables Creamed or fried vegetables. Vegetables in a cheese sauce. Regular canned vegetables (not low-sodium or reduced-sodium). Regular canned tomato sauce and paste (not low-sodium or reduced-sodium). Regular tomato and vegetable juice (not low-sodium or reduced-sodium). Angie Fava. Olives. Fruits Canned fruit in a light or heavy syrup. Fried fruit. Fruit in cream or butter sauce. Meat and other protein foods Fatty cuts of meat. Ribs. Fried meat. Berniece Salines. Sausage. Bologna and other processed lunch meats. Salami. Fatback. Hotdogs. Bratwurst. Salted nuts and seeds. Canned beans with added salt. Canned or smoked fish. Whole eggs or egg yolks. Chicken or [place] with skin. Dairy Whole or 2% milk, cream, and half-and-half. Whole or full-fat cream cheese. Whole-fat or sweetened yogurt. Full-fat cheese. Nondairy creamers. Whipped toppings. Processed cheese and cheese spreads. Fats and oils Butter. Stick margarine. Lard. Shortening. Ghee. Bacon fat. Tropical oils, such as coconut, palm kernel, or palm oil. Seasoning and other foods Salted popcorn and pretzels. Onion salt, garlic salt, seasoned salt, table salt, and sea salt. Worcestershire sauce. Tartar sauce.  Barbecue sauce. Teriyaki sauce. Soy sauce, including reduced-sodium. Steak sauce. Canned and packaged gravies. Fish sauce. Oyster sauce. Cocktail sauce. Horseradish that you find on the shelf. Ketchup. Mustard. Meat flavorings and tenderizers. Bouillon cubes. Hot sauce and Tabasco sauce. Premade or packaged marinades. Premade or packaged taco seasonings. Relishes. Regular salad dressings. Where to find more information:  National Heart, Lung, and [place]: https://wilson-eaton.com/  American Heart Association: www.heart.org Summary  The DASH eating plan is a healthy eating plan that has been shown to reduce high blood pressure (hypertension). It may also reduce your risk for type 2 diabetes, heart disease, and stroke.  With the DASH eating plan, you should limit salt (sodium) intake to 2,300 mg a day. If you have hypertension, you may need to reduce your sodium intake to 1,500 mg a day.  When on the DASH eating plan, aim to eat more fresh fruits and vegetables, whole grains, lean proteins, low-fat dairy, and heart-healthy fats.  Work with your health care provider or diet and nutrition specialist (dietitian) to adjust your eating plan to your individual calorie needs. This information  is not intended to replace advice given to you by your health care provider. Make sure you discuss any questions you have with your health care provider. Document Released: 03/25/2011 Document Revised: 03/29/2016 Document Reviewed: 03/29/2016 Elsevier Interactive Patient Education  2018 [employer]. Fat and Cholesterol Restricted Diet Getting too much fat and cholesterol in your diet may cause health problems. Following this diet helps keep your fat and cholesterol at normal levels. This can keep you from getting sick. What types of fat should I choose?  Choose monosaturated and polyunsaturated fats. These are found in foods such as olive oil, canola oil, flaxseeds, walnuts, almonds, and seeds.  Eat more  omega-3 fats. Good choices include salmon, mackerel, sardines, tuna, flaxseed oil, and ground flaxseeds.  Limit saturated fats. These are in animal products such as meats, butter, and cream. They can also be in plant products such as palm oil, palm kernel oil, and coconut oil.  Avoid foods with partially hydrogenated oils in them. These contain trans fats. Examples of foods that have trans fats are stick margarine, some tub margarines, cookies, crackers, and other baked goods. What general guidelines do I need to follow?  Check food labels. Look for the words "trans fat" and "saturated fat."  When preparing a meal: ? Fill half of your plate with vegetables and green salads. ? Fill one fourth of your plate with whole grains. Look for the word "whole" as the first word in the ingredient list. ? Fill one fourth of your plate with lean protein foods.  Eat more foods that have fiber, like apples, carrots, beans, peas, and barley.  Eat more home-cooked foods. Eat less at restaurants and buffets.  Limit or avoid alcohol.  Limit foods high in starch and sugar.  Limit fried foods.  Cook foods without frying them. Baking, boiling, grilling, and broiling are all great options.  Lose weight if you are overweight. Losing even a small amount of weight can help your overall health. It can also help prevent diseases such as diabetes and heart disease. What foods can I eat? Grains Whole grains, such as whole wheat or whole grain breads, crackers, cereals, and pasta. Unsweetened oatmeal, bulgur, barley, quinoa, or brown rice. Corn or whole wheat flour tortillas. Vegetables Fresh or frozen vegetables (raw, steamed, roasted, or grilled). Green salads. Fruits All fresh, canned (in natural juice), or frozen fruits. Meat and Other Protein Products Ground beef (85% or leaner), grass-fed beef, or beef trimmed of fat. Skinless chicken or [place]. Ground chicken or [place]. Pork trimmed of fat. All fish and  seafood. Eggs. Dried beans, peas, or lentils. Unsalted nuts or seeds. Unsalted canned or dry beans. Dairy Low-fat dairy products, such as skim or 1% milk, 2% or reduced-fat cheeses, low-fat ricotta or cottage cheese, or plain low-fat yogurt. Fats and Oils Tub margarines without trans fats. Light or reduced-fat mayonnaise and salad dressings. Avocado. Olive, canola, sesame, or safflower oils. Natural peanut or almond butter (choose ones without added sugar and oil). The items listed above may not be a complete list of recommended foods or beverages. Contact your dietitian for more options. What foods are not recommended? Grains White bread. White pasta. White rice. Cornbread. Bagels, pastries, and croissants. Crackers that contain trans fat. Vegetables White potatoes. Corn. Creamed or fried vegetables. Vegetables in a cheese sauce. Fruits Dried fruits. Canned fruit in light or heavy syrup. Fruit juice. Meat and Other Protein Products Fatty cuts of meat. Ribs, chicken wings, bacon, sausage, bologna, salami, chitterlings, fatback, hot  dogs, bratwurst, and packaged luncheon meats. Liver and organ meats. Dairy Whole or 2% milk, cream, half-and-half, and cream cheese. Whole milk cheeses. Whole-fat or sweetened yogurt. Full-fat cheeses. Nondairy creamers and whipped toppings. Processed cheese, cheese spreads, or cheese curds. Sweets and Desserts Corn syrup, sugars, honey, and molasses. Candy. Jam and jelly. Syrup. Sweetened cereals. Cookies, pies, cakes, donuts, muffins, and ice cream. Fats and Oils Butter, stick margarine, lard, shortening, ghee, or bacon fat. Coconut, palm kernel, or palm oils. Beverages Alcohol. Sweetened drinks (such as sodas, lemonade, and fruit drinks or punches). The items listed above may not be a complete list of foods and beverages to avoid. Contact your dietitian for more information. This information is not intended to replace advice given to you by your health care  provider. Make sure you discuss any questions you have with your health care provider. Document Released: 10/05/2011 Document Revised: 12/11/2015 Document Reviewed: 07/05/2013 Elsevier Interactive Patient Education  2018 [place] for [employer] Calories are units of energy. Your body needs a certain amount of calories from food to keep you going throughout the day. When you eat more calories than your body needs, your body stores the extra calories as fat. When you eat fewer calories than your body needs, your body burns fat to get the energy it needs. Calorie counting means keeping track of how many calories you eat and drink each day. Calorie counting can be helpful if you need to lose weight. If you make sure to eat fewer calories than your body needs, you should lose weight. Ask your health care provider what a healthy weight is for you. For calorie counting to work, you will need to eat the right number of calories in a day in order to lose a healthy amount of weight per week. A dietitian can help you determine how many calories you need in a day and will give you suggestions on how to reach your calorie goal.  A healthy amount of weight to lose per week is usually 1-2 lb (0.5-0.9 kg). This usually means that your daily calorie intake should be reduced by 500-750 calories.  Eating 1,200 - 1,500 calories per day can help most women lose weight.  Eating 1,500 - 1,800 calories per day can help most men lose weight.  What is my plan? My goal is to have __________ calories per day. If I have this many calories per day, I should lose around __________ pounds per week. What do I need to know about calorie counting? In order to meet your daily calorie goal, you will need to:  Find out how many calories are in each food you would like to eat. Try to do this before you eat.  Decide how much of the food you plan to eat.  Write down what you ate and how many calories it had.  Doing this is called keeping a food log.  To successfully lose weight, it is important to balance calorie counting with a healthy lifestyle that includes regular activity. Aim for 150 minutes of moderate exercise (such as walking) or 75 minutes of vigorous exercise (such as running) each week. Where do I find calorie information?  The number of calories in a food can be found on a Nutrition Facts label. If a food does not have a Nutrition Facts label, try to look up the calories online or ask your dietitian for help. Remember that calories are listed per serving. If you choose to have more than  one serving of a food, you will have to multiply the calories per serving by the amount of servings you plan to eat. For example, the label on a package of bread might say that a serving size is 1 slice and that there are 90 calories in a serving. If you eat 1 slice, you will have eaten 90 calories. If you eat 2 slices, you will have eaten 180 calories. How do I keep a food log? Immediately after each meal, record the following information in your food log:  What you ate. Don't forget to include toppings, sauces, and other extras on the food.  How much you ate. This can be measured in cups, ounces, or number of items.  How many calories each food and drink had.  The total number of calories in the meal.  Keep your food log near you, such as in a small notebook in your pocket, or use a mobile app or website. Some programs will calculate calories for you and show you how many calories you have left for the day to meet your goal. What are some calorie counting tips?  Use your calories on foods and drinks that will fill you up and not leave you hungry: ? Some examples of foods that fill you up are nuts and nut butters, vegetables, lean proteins, and high-fiber foods like whole grains. High-fiber foods are foods with more than 5 g fiber per serving. ? Drinks such as sodas, specialty coffee drinks, alcohol,  and juices have a lot of calories, yet do not fill you up.  Eat nutritious foods and avoid empty calories. Empty calories are calories you get from foods or beverages that do not have many vitamins or protein, such as candy, sweets, and soda. It is better to have a nutritious high-calorie food (such as an avocado) than a food with few nutrients (such as a bag of chips).  Know how many calories are in the foods you eat most often. This will help you calculate calorie counts faster.  Pay attention to calories in drinks. Low-calorie drinks include water and unsweetened drinks.  Pay attention to nutrition labels for "low fat" or "fat free" foods. These foods sometimes have the same amount of calories or more calories than the full fat versions. They also often have added sugar, starch, or salt, to make up for flavor that was removed with the fat.  Find a way of tracking calories that works for you. Get creative. Try different apps or programs if writing down calories does not work for you. What are some portion control tips?  Know how many calories are in a serving. This will help you know how many servings of a certain food you can have.  Use a measuring cup to measure serving sizes. You could also try weighing out portions on a kitchen scale. With time, you will be able to estimate serving sizes for some foods.  Take some time to put servings of different foods on your favorite plates, bowls, and cups so you know what a serving looks like.  Try not to eat straight from a bag or box. Doing this can lead to overeating. Put the amount you would like to eat in a cup or on a plate to make sure you are eating the right portion.  Use smaller plates, glasses, and bowls to prevent overeating.  Try not to multitask (for example, watch TV or use your computer) while eating. If it is time to eat, sit down  at a table and enjoy your food. This will help you to know when you are full. It will also help you to  be aware of what you are eating and how much you are eating. What are tips for following this plan? Reading food labels  Check the calorie count compared to the serving size. The serving size may be smaller than what you are used to eating.  Check the source of the calories. Make sure the food you are eating is high in vitamins and protein and low in saturated and trans fats. Shopping  Read nutrition labels while you shop. This will help you make healthy decisions before you decide to purchase your food.  Make a grocery list and stick to it. Cooking  Try to cook your favorite foods in a healthier way. For example, try baking instead of frying.  Use low-fat dairy products. Meal planning  Use more fruits and vegetables. Half of your plate should be fruits and vegetables.  Include lean proteins like poultry and fish. How do I count calories when eating out?  Ask for smaller portion sizes.  Consider sharing an entree and sides instead of getting your own entree.  If you get your own entree, eat only half. Ask for a box at the beginning of your meal and put the rest of your entree in it so you are not tempted to eat it.  If calories are listed on the menu, choose the lower calorie options.  Choose dishes that include vegetables, fruits, whole grains, low-fat dairy products, and lean protein.  Choose items that are boiled, broiled, grilled, or steamed. Stay away from items that are buttered, battered, fried, or served with cream sauce. Items labeled "crispy" are usually fried, unless stated otherwise.  Choose water, low-fat milk, unsweetened iced tea, or other drinks without added sugar. If you want an alcoholic beverage, choose a lower calorie option such as a glass of wine or light beer.  Ask for dressings, sauces, and syrups on the side. These are usually high in calories, so you should limit the amount you eat.  If you want a salad, choose a garden salad and ask for grilled  meats. Avoid extra toppings like bacon, cheese, or fried items. Ask for the dressing on the side, or ask for olive oil and vinegar or lemon to use as dressing.  Estimate how many servings of a food you are given. For example, a serving of cooked rice is  cup or about the size of half a baseball. Knowing serving sizes will help you be aware of how much food you are eating at restaurants. The list below tells you how big or small some common portion sizes are based on everyday objects: ? 1 oz-4 stacked dice. ? 3 oz-1 deck of cards. ? 1 tsp-1 die. ? 1 Tbsp- a ping-pong ball. ? 2 Tbsp-1 ping-pong ball. ?  cup- baseball. ? 1 cup-1 baseball. Summary  Calorie counting means keeping track of how many calories you eat and drink each day. If you eat fewer calories than your body needs, you should lose weight.  A healthy amount of weight to lose per week is usually 1-2 lb (0.5-0.9 kg). This usually means reducing your daily calorie intake by 500-750 calories.  The number of calories in a food can be found on a Nutrition Facts label. If a food does not have a Nutrition Facts label, try to look up the calories online or ask your dietitian for help.  Use your calories on foods and drinks that will fill you up, and not on foods and drinks that will leave you hungry.  Use smaller plates, glasses, and bowls to prevent overeating. This information is not intended to replace advice given to you by your health care provider. Make sure you discuss any questions you have with your health care provider. Document Released: 04/05/2005 Document Revised: 03/05/2016 Document Reviewed: 03/05/2016 Elsevier Interactive Patient Education  [employer].

## 2018-03-15 ENCOUNTER — Other Ambulatory Visit: Payer: Self-pay

## 2018-03-15 ENCOUNTER — Encounter: Payer: Self-pay | Admitting: Gerontology

## 2018-03-15 ENCOUNTER — Ambulatory Visit: Payer: Self-pay | Admitting: Gerontology

## 2018-03-15 VITALS — BP 124/86 | HR 54 | Ht 69.0 in | Wt 225.6 lb

## 2018-03-15 DIAGNOSIS — H6692 Otitis media, unspecified, left ear: Secondary | ICD-10-CM

## 2018-03-15 DIAGNOSIS — H6121 Impacted cerumen, right ear: Secondary | ICD-10-CM

## 2018-03-15 DIAGNOSIS — H9192 Unspecified hearing loss, left ear: Secondary | ICD-10-CM

## 2018-03-15 DIAGNOSIS — H9312 Tinnitus, left ear: Secondary | ICD-10-CM

## 2018-03-15 MED ORDER — AMOXICILLIN-POT CLAVULANATE 875-125 MG PO TABS: 1.0000 | ORAL_TABLET | Freq: Two times a day (BID) | ORAL | 0 refills | Status: DC

## 2018-03-15 NOTE — Patient Instructions (Signed)
Carbamide Peroxide ear solution  What is this medicine?  CARBAMIDE PEROXIDE (CAR bah mide per OX ide) is used to soften and help remove ear wax.  This medicine may be used for other purposes; ask your health care provider or pharmacist if you have questions.  COMMON BRAND NAME(S): Auro Ear, Auro Earache Relief, Debrox, Ear Drops, Ear Wax Removal, Ear Wax Remover, Earwax Treatment, Murine, Thera-Ear  What should I tell my health care provider before I take this medicine?  They need to know if you have any of these conditions:  -dizziness  -ear discharge  -ear pain, irritation or rash  -infection  -perforated eardrum (hole in eardrum)  -an unusual or allergic reaction to carbamide peroxide, glycerin, hydrogen peroxide, other medicines, foods, dyes, or preservatives  -pregnant or trying to get pregnant  -breast-feeding  How should I use this medicine?  This medicine is only for use in the outer ear canal. Follow the directions carefully. Wash hands before and after use. The solution may be warmed by holding the bottle in the hand for 1 to 2 minutes. Lie with the affected ear facing upward. Place the proper number of drops into the ear canal. After the drops are instilled, remain lying with the affected ear upward for 5 minutes to help the drops stay in the ear canal. A cotton ball may be gently inserted at the ear opening for no longer than 5 to 10 minutes to ensure retention. Repeat, if necessary, for the opposite ear. Do not touch the tip of the dropper to the ear, fingertips, or other surface. Do not rinse the dropper after use. Keep container tightly closed.  Talk to your pediatrician regarding the use of this medicine in children. While this drug may be used in children as young as 12 years for selected conditions, precautions do apply.  Overdosage: If you think you have taken too much of this medicine contact a poison control center or emergency room at once.   NOTE: This medicine is only for you. Do not share this medicine with others.  What if I miss a dose?  If you miss a dose, use it as soon as you can. If it is almost time for your next dose, use only that dose. Do not use double or extra doses.  What may interact with this medicine?  Interactions are not expected. Do not use any other ear products without asking your doctor or health care professional.  This list may not describe all possible interactions. Give your health care provider a list of all the medicines, herbs, non-prescription drugs, or dietary supplements you use. Also tell them if you smoke, drink alcohol, or use illegal drugs. Some items may interact with your medicine.  What should I watch for while using this medicine?  This medicine is not for long-term use. Do not use for more than 4 days without checking with your health care professional. Contact your doctor or health care professional if your condition does not start to get better within a few days or if you notice burning, redness, itching or swelling.  What side effects may I notice from receiving this medicine?  Side effects that you should report to your doctor or health care professional as soon as possible:  -allergic reactions like skin rash, itching or hives, swelling of the face, lips, or tongue  -burning, itching, and redness  -worsening ear pain  -rash  Side effects that usually do not require medical attention (report to your doctor   or health care professional if they continue or are bothersome):  -abnormal sensation while putting the drops in the ear  -temporary reduction in hearing (but not complete loss of hearing)  This list may not describe all possible side effects. Call your doctor for medical advice about side effects. You may report side effects to FDA at 1-800-FDA-1088.  Where should I keep my medicine?  Keep out of the reach of children.  Store at room temperature between 15 and 30 degrees C (59 and 86 degrees  F) in a tight, light-resistant container. Keep bottle away from excessive heat and direct sunlight. Throw away any unused medicine after the expiration date.  NOTE: This sheet is a summary. It may not cover all possible information. If you have questions about this medicine, talk to your doctor, pharmacist, or health care provider.  © 2018 Elsevier/Gold Standard (2007-07-18 14:00:02)

## 2018-03-15 NOTE — Progress Notes (Signed)
Patient: Larry French Male    DOB: 1960/08/02   57 y.o.   MRN: [id number] Visit Date: 03/15/2018  Today's Provider: Langston Reusing, NP   Chief Complaint  Patient presents with  . Follow-up    ear infection   Subjective:    HPI    Larry French 57 y/o male presents for follow up on left ear tinnitus, headache and dizziness. He reports that his dizziness has gotten worse that he is " dizzy sitting, standing or laying in bed". He admits experiencing constant " double vision and tinnitus in the left ear that's being going on for 2 weeks. He admits  using otc debrox solution and Ofloxacin ear drops for 6 days with no relief. He stated that he has being having constant dull 8/10 frontal headache that has being going on for 2 weeks which is relieved with 1000 mg tylenol. He admits that constant tinnitus and dizziness is affecting his job, " it's difficult to concentrate and talk on the phone". He denies fever, chills, syncopal episodes nor fall.  No Known Allergies Previous Medications   ASPIRIN 81 MG EC TABLET    TAKE ONE TABLET BY MOUTH EVERY DAY   BUPROPION (WELLBUTRIN XL) 300 MG 24 HR TABLET    Take 300 mg by mouth daily.   CLOPIDOGREL (PLAVIX) 75 MG TABLET    TAKE ONE TABLET BY MOUTH EVERY DAY   GABAPENTIN (NEURONTIN) 600 MG TABLET    Take 600 mg by mouth 3 (three) times daily.   HYDROXYZINE (ATARAX/VISTARIL) 50 MG TABLET    Take 1 tablet (50 mg total) by mouth every 6 (six) hours as needed for anxiety.   LOSARTAN (COZAAR) 25 MG TABLET    TAKE ONE TABLET BY MOUTH EVERY DAY   METOPROLOL TARTRATE (LOPRESSOR) 25 MG TABLET    TAKE ONE TABLET BY MOUTH 2 TIMES A DAY   OFLOXACIN (FLOXIN OTIC) 0.3 % OTIC SOLUTION    Place 5 drops into the left ear 2 (two) times daily.   QUETIAPINE (SEROQUEL) 300 MG TABLET    Take 300 mg by mouth at bedtime.   SIMVASTATIN (ZOCOR) 40 MG TABLET    TAKE ONE TABLET BY MOUTH EVERY MORNING   TRAZODONE (DESYREL) 100 MG TABLET    Take 200 mg by mouth at bedtime.     Review of Systems  HENT: Positive for hearing loss (decrease hearing to left ear). Negative for ear pain (constant tinnitus).   Eyes: Visual disturbance: double vision.  Respiratory: Negative.   Cardiovascular: Negative.   Neurological: Positive for dizziness and headaches (constant 8/10 frontal ).    Social History   Tobacco Use  . Smoking status: Current Every Day Smoker    Packs/day: 1.00    Years: 30.00    Pack years: 30.00    Types: Cigarettes  . Smokeless tobacco: Never Used  Substance Use Topics  . Alcohol use: No    Frequency: Never    Comment: Previous user    Objective:   BP 124/86   Pulse (!) 54   Ht 5\' 9"  (1.753 m)   Wt 225 lb 9.6 oz (102.3 kg)   SpO2 98%   BMI 33.32 kg/m   Physical Exam  HENT:  Head: Normocephalic.  Left Ear: External ear normal. Tympanic membrane is erythematous (erythema to left TM). Decreased hearing (unable to hear through the left ear rubbing of fingers) is noted.  Eyes: Pupils are equal, round, and reactive to light. EOM are normal.  Cardiovascular: Normal rate and regular rhythm.  Pulmonary/Chest: Effort normal and breath sounds normal.        Assessment & Plan:         1. Left otitis media, unspecified otitis media type - Started antibiotics for possible Otitis media - amoxicillin-clavulanate (AUGMENTIN) 875-125 MG tablet; Take 1 tablet by mouth 2 (two) times daily.  Dispense: 24 tablet; Refill: 0  2. Hearing loss of left ear, unspecified hearing loss type - ENT [facility] referral , charity care application provided  3. Tinnitus of left ear: - Start Augmentin, change position slowly. -Continue 1000 mg every 8 hours for headache, don't exceed 4000 mg in 24 hours  4. Impacted cerumen to right ear: - He is to use otc debrox solution to disimpact cerumen      Honesty Menta Jerold Coombe, NP   [facility]

## 2018-03-20 ENCOUNTER — Other Ambulatory Visit: Payer: Self-pay | Admitting: Adult Health Nurse Practitioner

## 2018-04-14 ENCOUNTER — Other Ambulatory Visit: Payer: Self-pay | Admitting: Otolaryngology

## 2018-04-14 DIAGNOSIS — H9122 Sudden idiopathic hearing loss, left ear: Secondary | ICD-10-CM

## 2018-05-02 ENCOUNTER — Ambulatory Visit
Admission: RE | Admit: 2018-05-02 | Discharge: 2018-05-02 | Disposition: A | Discharge status: Home / Self Care | Payer: Self-pay | Source: Ambulatory Visit | Attending: Otolaryngology | Admitting: Otolaryngology

## 2018-05-02 DIAGNOSIS — H9122 Sudden idiopathic hearing loss, left ear: Secondary | ICD-10-CM | POA: Insufficient documentation

## 2018-05-02 LAB — POCT I-STAT CREATININE: Creatinine, Ser: 1.3 mg/dL — ABNORMAL HIGH (ref 0.61–1.24)

## 2018-05-02 MED ORDER — GADOBUTROL 1 MMOL/ML IV SOLN
10.0000 mL | Freq: Once | INTRAVENOUS | Status: AC | PRN
  Administered 2018-05-02: 10 mL via INTRAVENOUS

## 2018-05-09 ENCOUNTER — Other Ambulatory Visit: Payer: Self-pay | Admitting: Adult Health Nurse Practitioner

## 2018-05-11 ENCOUNTER — Encounter: Payer: Self-pay | Admitting: Pharmacist

## 2018-08-11 ENCOUNTER — Other Ambulatory Visit: Payer: Self-pay | Admitting: Adult Health Nurse Practitioner

## 2018-08-30 ENCOUNTER — Other Ambulatory Visit: Payer: Self-pay

## 2018-08-31 ENCOUNTER — Other Ambulatory Visit: Payer: Self-pay | Admitting: Internal Medicine

## 2018-09-07 ENCOUNTER — Ambulatory Visit: Payer: Self-pay | Admitting: Gerontology

## 2018-09-14 ENCOUNTER — Ambulatory Visit: Payer: Self-pay | Admitting: Pharmacist

## 2018-09-14 ENCOUNTER — Encounter (INDEPENDENT_AMBULATORY_CARE_PROVIDER_SITE_OTHER): Payer: Self-pay

## 2018-09-14 ENCOUNTER — Other Ambulatory Visit: Payer: Self-pay

## 2018-09-14 ENCOUNTER — Telehealth: Payer: Self-pay | Admitting: Pharmacist

## 2018-09-14 DIAGNOSIS — Z79899 Other long term (current) drug therapy: Secondary | ICD-10-CM

## 2018-09-14 NOTE — Progress Notes (Signed)
[facility] Visit Note  Patient: Larry French MRN: [id number] Date of Birth: 02-17-1961 PCP: System, Pcp Not In   [name] 58 y.o. male, was contacted for his outreach medication therapy management review with the pharmacist. His identification was verified by name and date of birth.  There were no vitals taken for this visit.  Patient Information   Past Medical History:  Diagnosis Date  . Alcohol abuse    Recovered x 15 months  . Anxiety   . CA - skin cancer   . CAD, residual CFX LAD disease 06/01/2011   a. s/p inferior ST elevation MI s/p PCI/DES to RCA on 05/29/2011; b. residual LAD and LCx CAD tx'd on 06/02/11 cath with LAD CAD successfully tx'd w/ PCI/DES, LCx managaged medically   . Depression   . Headache(784.0)   . Hyperlipemia   . Hypertension   . Insomnia   . Mental disorder   . MI (myocardial infarction) ([place])   . Polysubstance abuse ([place])    a. etoh, xanax, tobacco  . PTSD (post-traumatic stress disorder)       Past Surgical History:  Procedure Laterality Date  . CARDIAC CATHETERIZATION Bilateral 04/09/2015   Procedure: Coronary Angiogram;  Surgeon: Wellington Hampshire, MD;  Location: [place] [facility];  Service: Cardiovascular;  Laterality: Bilateral;  . CARDIAC CATHETERIZATION [name] 04/09/2015   Procedure: Coronary Stent Intervention;  Surgeon: Wellington Hampshire, MD;  Location: [place] [facility];  Service: Cardiovascular;  Laterality: [name];  . LEFT HEART CATH Right 05/29/2011   Procedure: LEFT HEART CATH;  Surgeon: Leonie Man, MD;  Location: [facility] CATH LAB;  Service: Cardiovascular;  Laterality: Right;  . LEFT HEART CATHETERIZATION WITH CORONARY ANGIOGRAM [name] 06/01/2011   Procedure: LEFT HEART CATHETERIZATION WITH CORONARY ANGIOGRAM;  Surgeon: Leonie Man, MD;  Location: [facility] CATH LAB;  Service: Cardiovascular;  Laterality: [name];  relook cath, possible PCI  . TONSILLECTOMY AND ADENOIDECTOMY       Family History  Problem  Relation Age of Onset  . Cancer Mother        Non-Hodgkin lymphoma  . Depression Mother   . Anxiety disorder Mother   . Depression Father   . Anxiety disorder Father   . Coronary artery disease Maternal Grandfather 32       Died    New Diagnoses (since last visit):   Family Support: Good    Current Exercise Habits: Home exercise routine(working around house and upkeep)       Social History   Substance and Sexual Activity  Alcohol Use No  . Frequency: Never   Comment: Previous user       Social History   Tobacco Use  Smoking Status Current Every Day Smoker  . Packs/day: 1.00  . Years: 30.00  . Pack years: 30.00  . Types: Cigarettes  Smokeless Tobacco Never Used      Health Maintenance  Topic Date Due  . Hepatitis C Screening  03/[birth year]/1962  . PNEUMOCOCCAL POLYSACCHARIDE VACCINE AGE 36-64 HIGH RISK  01/14/1964  . FOOT EXAM  01/14/1972  . HIV Screening  01/13/1977  . TETANUS/TDAP  01/13/1981  . COLONOSCOPY  01/14/2012  . OPHTHALMOLOGY EXAM  11/25/2017  . HEMOGLOBIN A1C  08/31/2018  . INFLUENZA VACCINE  11/18/2018   Health Maintenance/Date Completed  Last ED visit: 2017 Last Visit to PCP: 03/17/18 Next Visit to PCP: 09/26/18 Eye Exam: 11/25/2016  Outpatient Encounter Medications as of 09/14/2018  Medication Sig  . aspirin 81 MG  EC tablet TAKE ONE TABLET BY MOUTH EVERY DAY  . clonazePAM (KLONOPIN) 1 MG tablet Take 1 mg by mouth daily. 1/2 - 2 daily  . clopidogrel (PLAVIX) 75 MG tablet TAKE ONE TABLET BY MOUTH EVERY DAY  . gabapentin (NEURONTIN) 600 MG tablet Take 600 mg by mouth 2 (two) times daily.   . hydrOXYzine (ATARAX/VISTARIL) 50 MG tablet Take 1 tablet (50 mg total) by mouth every 6 (six) hours as needed for anxiety.  Marland Kitchen losartan (COZAAR) 25 MG tablet TAKE ONE TABLET BY MOUTH EVERY DAY  . metoprolol tartrate (LOPRESSOR) 25 MG tablet TAKE ONE TABLET BY MOUTH 2 TIMES A DAY  . QUEtiapine (SEROQUEL) 200 MG tablet Take 200 mg by mouth at bedtime. 1/2 - 2  tablets at bedtime as needed  . simvastatin (ZOCOR) 40 MG tablet TAKE ONE TABLET BY MOUTH EVERY MORNING  . traZODone (DESYREL) 100 MG tablet Take 200 mg by mouth at bedtime. 1-2 tablets  . [DISCONTINUED] amoxicillin-clavulanate (AUGMENTIN) 875-125 MG tablet Take 1 tablet by mouth 2 (two) times daily.  . [DISCONTINUED] buPROPion (WELLBUTRIN XL) 300 MG 24 hr tablet Take 300 mg by mouth daily.  . [DISCONTINUED] ofloxacin (FLOXIN OTIC) 0.3 % OTIC solution Place 5 drops into the left ear 2 (two) times daily.  . [DISCONTINUED] QUEtiapine (SEROQUEL) 300 MG tablet Take 300 mg by mouth at bedtime.   Facility-Administered Encounter Medications as of 09/14/2018  Medication  . carbamide peroxide (DEBROX) 6.5 % OTIC (EAR) solution 5 drop    Assessment:  Medication Adherence/Access: Mr. Prosise receives all of [facility] from [facility]. Takes all medications as prescribed.  DM: States his plate consists of 3/4 vegetables, 1/4 protein and minimal carbohydrates. Not currently monitoring his blood glucose.  Works in his garden to stay active. A1c = 6.5% (09/21/18); SCr = 1.27 mg/dl (09/21/18)  Hypertension: losartan, metoprolol Not able to assess at this visit.  Prior STEMI/CAD: aspirin, clopidogrel, simvastatin, metoprolol, losartan 2013- 2 drug eluding stents placed. Patient continues to smoke. Will provided information on smoking cessation from [place].  Hyperlipidemia: simvastatin TC = 190 mg/dl; TG = 221 mg/dl; HDL = 34 mg/dl; LDL = 112 mg/dl (09/21/18). Currently on a moderate intensity statin, simvastatin 40 mg. Patient would benefit from a high intensity statin such as rosuvastatin 20 mg daily.  Anxiety/Depression/Sleep/PTSD: gabapentin, hydroxyzine, quetiapine, trazodone To reduce his anxiety he gardens, avoids certain situations and makes sure he gets enough sleep. He recently started sertraline for his mood. He takes 1/2 - 2 tablets of the quetiapine 200 mg and  trazodone for sleep.   PLAN: Appointment with [facility] 09/26/18 Sent message to [facility] to recommend high intensity statin RTC in 1 year for MTM   Micha Dosanjh K. Dicky Doe, PharmD [facility] [place] Operations Coordinator [phone]

## 2018-09-20 ENCOUNTER — Other Ambulatory Visit: Payer: Self-pay

## 2018-09-21 ENCOUNTER — Other Ambulatory Visit: Payer: Self-pay

## 2018-09-21 ENCOUNTER — Ambulatory Visit: Payer: Self-pay | Admitting: Gerontology

## 2018-09-21 DIAGNOSIS — I10 Essential (primary) hypertension: Secondary | ICD-10-CM

## 2018-09-21 DIAGNOSIS — E119 Type 2 diabetes mellitus without complications: Secondary | ICD-10-CM

## 2018-09-21 DIAGNOSIS — E785 Hyperlipidemia, unspecified: Secondary | ICD-10-CM

## 2018-09-22 LAB — COMPREHENSIVE METABOLIC PANEL
ALT: 58 IU/L — ABNORMAL HIGH (ref 0–44)
AST: 54 IU/L — ABNORMAL HIGH (ref 0–40)
Albumin/Globulin Ratio: 2 (ref 1.2–2.2)
Albumin: 4.3 g/dL (ref 3.8–4.9)
Alkaline Phosphatase: 84 IU/L (ref 39–117)
BUN/Creatinine Ratio: 9 (ref 9–20)
BUN: 12 mg/dL (ref 6–24)
Bilirubin Total: <0.2 mg/dL (ref 0.0–1.2)
CO2: 21 mmol/L (ref 20–29)
Calcium: 9.3 mg/dL (ref 8.7–10.2)
Chloride: 104 mmol/L (ref 96–106)
Creatinine, Ser: 1.27 mg/dL (ref 0.76–1.27)
GFR calc Af Amer: 73 mL/min/{1.73_m2} (ref >59)
GFR calc non Af Amer: 63 mL/min/{1.73_m2} (ref >59)
Globulin, Total: 2.2 g/dL (ref 1.5–4.5)
Glucose: 78 mg/dL (ref 65–99)
Potassium: 4 mmol/L (ref 3.5–5.2)
Sodium: 141 mmol/L (ref 134–144)
Total Protein: 6.5 g/dL (ref 6.0–8.5)

## 2018-09-22 LAB — HEMOGLOBIN A1C
Est. average glucose Bld gHb Est-mCnc: 140 mg/dL
Hgb A1c MFr Bld: 6.5 % — ABNORMAL HIGH (ref 4.8–5.6)

## 2018-09-22 LAB — LIPID PANEL
Chol/HDL Ratio: 5.6 ratio — ABNORMAL HIGH (ref 0.0–5.0)
Cholesterol, Total: 190 mg/dL (ref 100–199)
HDL: 34 mg/dL — ABNORMAL LOW (ref >39)
LDL Calculated: 112 mg/dL — ABNORMAL HIGH (ref 0–99)
Triglycerides: 221 mg/dL — ABNORMAL HIGH (ref 0–149)
VLDL Cholesterol Cal: 44 mg/dL — ABNORMAL HIGH (ref 5–40)

## 2018-09-26 ENCOUNTER — Ambulatory Visit: Payer: Self-pay | Admitting: Gerontology

## 2018-09-26 ENCOUNTER — Encounter: Payer: Self-pay | Admitting: Gerontology

## 2018-09-26 ENCOUNTER — Other Ambulatory Visit: Payer: Self-pay

## 2018-09-26 VITALS — BP 140/70 | Wt 220.0 lb

## 2018-09-26 DIAGNOSIS — H9192 Unspecified hearing loss, left ear: Secondary | ICD-10-CM

## 2018-09-26 DIAGNOSIS — I10 Essential (primary) hypertension: Secondary | ICD-10-CM

## 2018-09-26 DIAGNOSIS — E785 Hyperlipidemia, unspecified: Secondary | ICD-10-CM

## 2018-09-26 DIAGNOSIS — E119 Type 2 diabetes mellitus without complications: Secondary | ICD-10-CM

## 2018-09-26 DIAGNOSIS — R748 Abnormal levels of other serum enzymes: Secondary | ICD-10-CM

## 2018-09-26 DIAGNOSIS — N529 Male erectile dysfunction, unspecified: Secondary | ICD-10-CM

## 2018-09-26 MED ORDER — ROSUVASTATIN CALCIUM 20 MG PO TABS: 20.0000 mg | ORAL_TABLET | Freq: Every day | ORAL | 3 refills | Status: DC

## 2018-09-26 MED ORDER — LOSARTAN POTASSIUM 25 MG PO TABS: 25.0000 mg | ORAL_TABLET | Freq: Every day | ORAL | 2 refills | Status: DC

## 2018-09-26 MED ORDER — METOPROLOL TARTRATE 25 MG PO TABS: 25.0000 mg | ORAL_TABLET | Freq: Two times a day (BID) | ORAL | 3 refills | Status: DC

## 2018-09-26 MED ORDER — CLOPIDOGREL BISULFATE 75 MG PO TABS: 75.0000 mg | ORAL_TABLET | Freq: Every day | ORAL | 1 refills | Status: DC

## 2018-09-26 NOTE — Progress Notes (Signed)
Established Patient Office Visit  Subjective:  Patient ID: Larry French, male    DOB: December 20, 1960  Age: 58 y.o. MRN: [id number]  CC:  Chief Complaint  Patient presents with  . Diabetes  . Erectile Dysfunction  Patient consents to telephone visit and two patient identifiers was used to identify patient.  HPI Larry French presents for follow up Hearing loss ,type 2 DM, HTN and hyperlipidemia. He reports that he was seen at  [facility] by Dr Pryor Ochoa due to loss of hearing to his left ear. An MRI was done on 05/02/18 , no structural findings to explain patient's symptoms was identified. Per patient, he reports that audiology exam was conducted and he has lost 80% hearing to his left ear, and 10% to right ear,  and Dr Pryor Ochoa  suggests neurology referral. He denies otalgia, tinnitus and headache.  His HgbA1c done on 09/21/18 was 6.5%, he states that he has made some lifestyle modifications and does not want to start on any medications. He continues to take Losartan 25 mg daily, metoprolol 25 mg bid. He states that he checks his blood pressure at home and last reading was 140/70, he continues to smoke 1/2 pack of cigarette daily and denies the desire to quit. He denies dizziness, chest pain and palpitation. He also takes Clopidogrel 75 mg and aspirin 81 mg daily, and he denies bruises, hematuria, hematochezia and bleeding. He states that he has being taking simvastatin 40 mg , last lipid panel done on 09/21/18, Total cholesterol was 190, Triglycerides 221, HDL 34 and LDL was 112 mg/dl. He denies myalgia and weakness. Currently he c/o not being able to achieve or maintain erection and it has being going on for 3 months. He denies penile or scrotal pain. His liver enzymes are elevated, AST 54 and ALT 58, he denies alcohol intake,abdominal pain and jaundice. He states that his mood is good and denies suicidal or homicidal ideation, fever, chills and no further concerns.   Past Medical History:   Diagnosis Date  . Alcohol abuse    Recovered x 15 months  . Anxiety   . CA - skin cancer   . CAD, residual CFX LAD disease 06/01/2011   a. s/p inferior ST elevation MI s/p PCI/DES to RCA on 05/29/2011; b. residual LAD and LCx CAD tx'd on 06/02/11 cath with LAD CAD successfully tx'd w/ PCI/DES, LCx managaged medically   . Depression   . Headache(784.0)   . Hyperlipemia   . Hypertension   . Insomnia   . Mental disorder   . MI (myocardial infarction) ([place])   . Polysubstance abuse ([place])    a. etoh, xanax, tobacco  . PTSD (post-traumatic stress disorder)     Past Surgical History:  Procedure Laterality Date  . CARDIAC CATHETERIZATION Bilateral 04/09/2015   Procedure: Coronary Angiogram;  Surgeon: Wellington Hampshire, MD;  Location: [place] [facility];  Service: Cardiovascular;  Laterality: Bilateral;  . CARDIAC CATHETERIZATION [name] 04/09/2015   Procedure: Coronary Stent Intervention;  Surgeon: Wellington Hampshire, MD;  Location: [place] [facility];  Service: Cardiovascular;  Laterality: [name];  . LEFT HEART CATH Right 05/29/2011   Procedure: LEFT HEART CATH;  Surgeon: Leonie Man, MD;  Location: [facility];  Service: Cardiovascular;  Laterality: Right;  . LEFT HEART CATHETERIZATION WITH CORONARY ANGIOGRAM [name] 06/01/2011   Procedure: LEFT HEART CATHETERIZATION WITH CORONARY ANGIOGRAM;  Surgeon: Leonie Man, MD;  Location: [facility] CATH LAB;  Service: Cardiovascular;  Laterality: [name];  relook cath, possible PCI  . TONSILLECTOMY AND ADENOIDECTOMY      Family History  Problem Relation Age of Onset  . Cancer Mother        Non-Hodgkin lymphoma  . Depression Mother   . Anxiety disorder Mother   . Depression Father   . Anxiety disorder Father   . Coronary artery disease Maternal Grandfather 72       Died    Social History   Socioeconomic History  . Marital status: Single    Spouse name: Not on file  . Number of children: 0  . Years of education: Not on file  . Highest education level:  Not on file  Occupational History  . Occupation: Electrical engineer: [employer]  . Financial resource strain: Not on file  . Food insecurity:    Worry: Not on file    Inability: Not on file  . Transportation needs:    Medical: Not on file    Non-medical: Not on file  Tobacco Use  . Smoking status: Current Every Day Smoker    Packs/day: 1.00    Years: 30.00    Pack years: 30.00    Types: Cigarettes  . Smokeless tobacco: Never Used  Substance and Sexual Activity  . Alcohol use: No    Frequency: Never    Comment: Previous user   . Drug use: No  . Sexual activity: Never  Lifestyle  . Physical activity:    Days per week: Not on file    Minutes per session: Not on file  . Stress: Not on file  Relationships  . Social connections:    Talks on phone: Not on file    Gets together: Not on file    Attends religious service: Not on file    Active member of club or organization: Not on file    Attends meetings of clubs or organizations: Not on file    Relationship status: Not on file  . Intimate partner violence:    Fear of current or ex partner: Not on file    Emotionally abused: Not on file    Physically abused: Not on file    Forced sexual activity: Not on file  Other Topics Concern  . Not on file  Social History Narrative  . Not on file    Outpatient Medications Prior to Visit  Medication Sig Dispense Refill  . aspirin 81 MG EC tablet TAKE ONE TABLET BY MOUTH EVERY DAY 90 tablet 0  . gabapentin (NEURONTIN) 600 MG tablet Take 600 mg by mouth 2 (two) times daily.     . hydrOXYzine (ATARAX/VISTARIL) 50 MG tablet Take 1 tablet (50 mg total) by mouth every 6 (six) hours as needed for anxiety. 30 tablet 0  . QUEtiapine (SEROQUEL) 200 MG tablet Take 200 mg by mouth at bedtime. 1/2 - 2 tablets at bedtime as needed    . traZODone (DESYREL) 100 MG tablet Take 200 mg by mouth at bedtime. 1-2 tablets    . clopidogrel (PLAVIX) 75 MG tablet TAKE ONE TABLET  BY MOUTH EVERY DAY 90 tablet 0  . losartan (COZAAR) 25 MG tablet TAKE ONE TABLET BY MOUTH EVERY DAY 30 tablet 0  . metoprolol tartrate (LOPRESSOR) 25 MG tablet TAKE ONE TABLET BY MOUTH 2 TIMES A DAY 180 tablet 0  . simvastatin (ZOCOR) 40 MG tablet TAKE ONE TABLET BY MOUTH EVERY MORNING 90 tablet 0  . clonazePAM (KLONOPIN) 1 MG tablet Take 1  mg by mouth daily. 1/2 - 2 daily    . carbamide peroxide (DEBROX) 6.5 % OTIC (EAR) solution 5 drop      No facility-administered medications prior to visit.     No Known Allergies  ROS Review of Systems  Constitutional: Negative.   HENT: Positive for hearing loss (80% loss to left ear and 10 % to right ear.). Negative for tinnitus.   Respiratory: Negative.   Cardiovascular: Negative.   Gastrointestinal: Negative.   Genitourinary: Negative.   Musculoskeletal: Negative.   Skin: Negative.   Neurological: Negative.   Hematological: Negative.   Psychiatric/Behavioral: Negative.       Objective:    Physical Exam No vital sign or PE done BP 140/70   Wt 220 lb (99.8 kg)   BMI 32.49 kg/m  Wt Readings from Last 3 Encounters:  09/26/18 220 lb (99.8 kg)  03/15/18 225 lb 9.6 oz (102.3 kg)  03/09/18 225 lb 8 oz (102.3 kg)     Health Maintenance Due  Topic Date Due  . Hepatitis C Screening  11-21-1960  . PNEUMOCOCCAL POLYSACCHARIDE VACCINE AGE 71-64 HIGH RISK  01/14/1964  . FOOT EXAM  01/14/1972  . HIV Screening  01/13/1977  . TETANUS/TDAP  01/13/1981  . COLONOSCOPY  01/14/2012  . OPHTHALMOLOGY EXAM  11/25/2017    There are no preventive care reminders to display for this patient.  Lab Results  Component Value Date   TSH 2.790 03/02/2018   Lab Results  Component Value Date   WBC 10.3 03/02/2018   HGB 17.7 03/02/2018   HCT 50.2 03/02/2018   MCV 86 03/02/2018   PLT 254 03/02/2018   Lab Results  Component Value Date   NA 141 09/21/2018   K 4.0 09/21/2018   CO2 21 09/21/2018   GLUCOSE 78 09/21/2018   BUN 12 09/21/2018    CREATININE 1.27 09/21/2018   BILITOT <0.2 09/21/2018   ALKPHOS 84 09/21/2018   AST 54 (H) 09/21/2018   ALT 58 (H) 09/21/2018   PROT 6.5 09/21/2018   ALBUMIN 4.3 09/21/2018   CALCIUM 9.3 09/21/2018   ANIONGAP 14 06/18/2015   Lab Results  Component Value Date   CHOL 190 09/21/2018   Lab Results  Component Value Date   HDL 34 (L) 09/21/2018   Lab Results  Component Value Date   LDLCALC 112 (H) 09/21/2018   Lab Results  Component Value Date   TRIG 221 (H) 09/21/2018   Lab Results  Component Value Date   CHOLHDL 5.6 (H) 09/21/2018   Lab Results  Component Value Date   HGBA1C 6.5 (H) 09/21/2018      Assessment & Plan:     1. Hearing loss of left ear, unspecified hearing loss type - He was encouraged to complete [facility] charity care application for - Ambulatory referral to Neurology  2. Essential hypertension -Low salt DASH diet -Take medications regularly on time -Exercise regularly as tolerated -Check blood pressure daily, record and bring log to visit -Goal is less than 140/90 and normal blood pressure is 120/80 - clopidogrel (PLAVIX) 75 MG tablet; Take 1 tablet (75 mg total) by mouth daily.  Dispense: 90 tablet; Refill: 1 - losartan (COZAAR) 25 MG tablet; Take 1 tablet (25 mg total) by mouth daily for 30 days.  Dispense: 30 tablet; Refill: 2 - metoprolol tartrate (LOPRESSOR) 25 MG tablet; Take 1 tablet (25 mg total) by mouth 2 (two) times daily.  Dispense: 60 tablet; Refill: 3  3. Diabetes mellitus without complication ([place]) -  His HgbA1c was 6.5%, he was advised to continue on lifestyle modifications, and will recheck A1c in 3 months. -Use Diabetic diet as advised -Regular exercise   4. Hyperlipidemia, unspecified hyperlipidemia type - He will start Crestor and he was educated on medication side effects. - rosuvastatin (CRESTOR) 20 MG tablet; Take 1 tablet (20 mg total) by mouth daily.  Dispense: 30 tablet; Refill: 3 -Low fat Diet, like low fat dairy products eg  skimmed milk -Avoid any fried food -Regular exercise/walk -Goal for Total Cholesterol is less than 200 -Goal for bad cholesterol LDL is less than 70 -Goal for Good cholesterol HDL is more than 45 -Goal for Triglyceride is less than 150   5. Erectile dysfunction, unspecified erectile dysfunction type - He was encouraged to complete charity care application for- Ambulatory referral to Urology  6. Elevated liver enzymes - LFT will be rechecked in 3 months.   Follow-up: Return in about 3 weeks (around 10/17/2018), or if symptoms worsen or fail to improve.    Dreden Rivere Jerold Coombe, NP

## 2018-10-06 ENCOUNTER — Other Ambulatory Visit: Payer: Self-pay | Admitting: Gerontology

## 2018-10-06 DIAGNOSIS — E119 Type 2 diabetes mellitus without complications: Secondary | ICD-10-CM

## 2018-10-17 ENCOUNTER — Ambulatory Visit: Payer: Self-pay | Admitting: Gerontology

## 2018-10-17 ENCOUNTER — Other Ambulatory Visit: Payer: Self-pay

## 2018-10-17 VITALS — BP 137/88 | HR 68 | Ht 69.0 in | Wt 216.0 lb

## 2018-10-17 DIAGNOSIS — E119 Type 2 diabetes mellitus without complications: Secondary | ICD-10-CM

## 2018-10-17 DIAGNOSIS — F411 Generalized anxiety disorder: Secondary | ICD-10-CM

## 2018-10-17 DIAGNOSIS — Z720 Tobacco use: Secondary | ICD-10-CM

## 2018-10-17 DIAGNOSIS — H9190 Unspecified hearing loss, unspecified ear: Secondary | ICD-10-CM

## 2018-10-17 DIAGNOSIS — N529 Male erectile dysfunction, unspecified: Secondary | ICD-10-CM

## 2018-10-17 DIAGNOSIS — R748 Abnormal levels of other serum enzymes: Secondary | ICD-10-CM

## 2018-10-17 MED ORDER — GLIPIZIDE 5 MG PO TABS: 2.5000 mg | ORAL_TABLET | Freq: Every day | ORAL | 0 refills | Status: DC

## 2018-10-17 MED ORDER — BLOOD GLUCOSE MONITOR KIT: PACK | 0 refills | Status: DC

## 2018-10-17 NOTE — Progress Notes (Signed)
Established Patient Office Visit  Subjective:  Patient ID: Larry French, male    DOB: 03/18/1961  Age: 58 y.o. MRN: [id number]  CC:  Chief Complaint  Patient presents with  . Follow-up    cholesterol is improving; has appts scheduled for Urology erectile dysfunction    HPI Larry French presents for follow up of hearing loss, erectile dysfunction, elevated liver enzymes and type 2 diabetes. He reports that he continues to experience bilateral hearing loss and is awaiting ENT appointment at [facility]. He denies otalgia, tinnitus and otorrhea. He states that he will follow up with Urologist on 11/14/18 for erectile dysfunction for he continues to experience difficulty achieving and maintaining erection. He continues to smoke 1 pack of cigarette daily and denies the desire to quit. He continues to take 20 mg of Crestor and denies myalgia and muscle weakness. His HgbA1c done on 09/21/18 was 6.5% and he states that he is willing to take glucose lowering agent. He blood glucose checked during visit was 76 mg/dl, and he denies hypoglycemic/hyperglycemic symptoms. His liver enzymes were elevated on lab done 09/21/18 , AST 54 and ALT 58, he denies jaundice, right upper quadrant pain and has being sober for 4 years. He states that his mood is good and denies suicidal or homicidal ideation. He denies chest pain, palpitation, fever, chills and no further concerns.  Past Medical History:  Diagnosis Date  . Alcohol abuse    Recovered x 15 months  . Anxiety   . CA - skin cancer   . CAD, residual CFX LAD disease 06/01/2011   a. s/p inferior ST elevation MI s/p PCI/DES to RCA on 05/29/2011; b. residual LAD and LCx CAD tx'd on 06/02/11 cath with LAD CAD successfully tx'd w/ PCI/DES, LCx managaged medically   . Depression   . Headache(784.0)   . Hyperlipemia   . Hypertension   . Insomnia   . Mental disorder   . MI (myocardial infarction) ([place])   . Polysubstance abuse ([place])    a. etoh, xanax, tobacco   . PTSD (post-traumatic stress disorder)     Past Surgical History:  Procedure Laterality Date  . CARDIAC CATHETERIZATION Bilateral 04/09/2015   Procedure: Coronary Angiogram;  Surgeon: Wellington Hampshire, MD;  Location: [place] [facility];  Service: Cardiovascular;  Laterality: Bilateral;  . CARDIAC CATHETERIZATION [name] 04/09/2015   Procedure: Coronary Stent Intervention;  Surgeon: Wellington Hampshire, MD;  Location: [place] [facility];  Service: Cardiovascular;  Laterality: [name];  . LEFT HEART CATH Right 05/29/2011   Procedure: LEFT HEART CATH;  Surgeon: Leonie Man, MD;  Location: [facility] CATH LAB;  Service: Cardiovascular;  Laterality: Right;  . LEFT HEART CATHETERIZATION WITH CORONARY ANGIOGRAM [name] 06/01/2011   Procedure: LEFT HEART CATHETERIZATION WITH CORONARY ANGIOGRAM;  Surgeon: Leonie Man, MD;  Location: [facility] CATH LAB;  Service: Cardiovascular;  Laterality: [name];  relook cath, possible PCI  . TONSILLECTOMY AND ADENOIDECTOMY      Family History  Problem Relation Age of Onset  . Cancer Mother        Non-Hodgkin lymphoma  . Depression Mother   . Anxiety disorder Mother   . Depression Father   . Anxiety disorder Father   . Coronary artery disease Maternal Grandfather 7       Died    Social History   Socioeconomic History  . Marital status: Single    Spouse name: Not on file  . Number of children: 0  . Years of education:  Not on file  . Highest education level: Not on file  Occupational History  . Occupation: Electrical engineer: [employer]  . Financial resource strain: Not on file  . Food insecurity    Worry: Not on file    Inability: Not on file  . Transportation needs    Medical: Not on file    Non-medical: Not on file  Tobacco Use  . Smoking status: Current Every Day Smoker    Packs/day: 1.00    Years: 30.00    Pack years: 30.00    Types: Cigarettes  . Smokeless tobacco: Never Used  Substance and Sexual Activity  . Alcohol use: No     Frequency: Never    Comment: Previous user   . Drug use: No  . Sexual activity: Never  Lifestyle  . Physical activity    Days per week: Not on file    Minutes per session: Not on file  . Stress: Not on file  Relationships  . Social Herbalist on phone: Not on file    Gets together: Not on file    Attends religious service: Not on file    Active member of club or organization: Not on file    Attends meetings of clubs or organizations: Not on file    Relationship status: Not on file  . Intimate partner violence    Fear of current or ex partner: Not on file    Emotionally abused: Not on file    Physically abused: Not on file    Forced sexual activity: Not on file  Other Topics Concern  . Not on file  Social History Narrative  . Not on file    Outpatient Medications Prior to Visit  Medication Sig Dispense Refill  . aspirin 81 MG EC tablet TAKE ONE TABLET BY MOUTH EVERY DAY 90 tablet 0  . clonazePAM (KLONOPIN) 1 MG tablet Take 1 mg by mouth daily. 1/2 - 2 daily    . clopidogrel (PLAVIX) 75 MG tablet Take 1 tablet (75 mg total) by mouth daily. 90 tablet 1  . gabapentin (NEURONTIN) 600 MG tablet Take 600 mg by mouth 2 (two) times daily.     . hydrOXYzine (ATARAX/VISTARIL) 50 MG tablet Take 1 tablet (50 mg total) by mouth every 6 (six) hours as needed for anxiety. 30 tablet 0  . losartan (COZAAR) 25 MG tablet Take 1 tablet (25 mg total) by mouth daily for 30 days. 30 tablet 2  . metoprolol tartrate (LOPRESSOR) 25 MG tablet Take 1 tablet (25 mg total) by mouth 2 (two) times daily. 60 tablet 3  . QUEtiapine (SEROQUEL) 200 MG tablet Take 200 mg by mouth at bedtime. 1/2 - 2 tablets at bedtime as needed    . rosuvastatin (CRESTOR) 20 MG tablet Take 1 tablet (20 mg total) by mouth daily. 30 tablet 3  . traZODone (DESYREL) 100 MG tablet Take 200 mg by mouth at bedtime. 1-2 tablets     No facility-administered medications prior to visit.     No Known Allergies  ROS Review  of Systems  Constitutional: Negative.   HENT: Negative for nosebleeds.   Respiratory: Negative.   Cardiovascular: Negative.   Gastrointestinal: Negative.   Endocrine: Negative.   Genitourinary: Negative.   Musculoskeletal: Negative.   Skin: Negative.   Neurological: Negative.   Hematological: Negative.   Psychiatric/Behavioral: Negative.       Objective:    Physical Exam  Constitutional:  He is oriented to person, place, and time. He appears well-developed and well-nourished.  HENT:  Head: Normocephalic and atraumatic.  Eyes: Pupils are equal, round, and reactive to light. EOM are normal.  Neck: Normal range of motion. Neck supple.  Cardiovascular: Normal rate and regular rhythm.  Pulmonary/Chest: Effort normal and breath sounds normal.  Abdominal: Soft. Bowel sounds are normal.  Central obesity  Musculoskeletal: Normal range of motion.  Neurological: He is alert and oriented to person, place, and time.  Skin: Skin is warm and dry.  Psychiatric: He has a normal mood and affect.    BP 137/88 (BP Location: Left Arm, Patient Position: Sitting)   Pulse 68   Ht _0  (1.753 m)   Wt 216 lb (98 kg)   SpO2 98%   BMI 31.90 kg/m  Wt Readings from Last 3 Encounters:  10/17/18 216 lb (98 kg)  09/26/18 220 lb (99.8 kg)  03/15/18 225 lb 9.6 oz (102.3 kg)     Health Maintenance Due  Topic Date Due  . Hepatitis C Screening  1961-03-24  . PNEUMOCOCCAL POLYSACCHARIDE VACCINE AGE 69-64 HIGH RISK  01/14/1964  . FOOT EXAM  01/14/1972  . HIV Screening  01/13/1977  . TETANUS/TDAP  01/13/1981  . COLONOSCOPY  01/14/2012  . OPHTHALMOLOGY EXAM  11/25/2017    There are no preventive care reminders to display for this patient.  Lab Results  Component Value Date   TSH 2.790 03/02/2018   Lab Results  Component Value Date   WBC 10.3 03/02/2018   HGB 17.7 03/02/2018   HCT 50.2 03/02/2018   MCV 86 03/02/2018   PLT 254 03/02/2018   Lab Results  Component Value Date   NA 141  09/21/2018   K 4.0 09/21/2018   CO2 21 09/21/2018   GLUCOSE 78 09/21/2018   BUN 12 09/21/2018   CREATININE 1.27 09/21/2018   BILITOT <0.2 09/21/2018   ALKPHOS 84 09/21/2018   AST 54 (H) 09/21/2018   ALT 58 (H) 09/21/2018   PROT 6.5 09/21/2018   ALBUMIN 4.3 09/21/2018   CALCIUM 9.3 09/21/2018   ANIONGAP 14 06/18/2015   Lab Results  Component Value Date   CHOL 190 09/21/2018   Lab Results  Component Value Date   HDL 34 (L) 09/21/2018   Lab Results  Component Value Date   LDLCALC 112 (H) 09/21/2018   Lab Results  Component Value Date   TRIG 221 (H) 09/21/2018   Lab Results  Component Value Date   CHOLHDL 5.6 (H) 09/21/2018   Lab Results  Component Value Date   HGBA1C 6.5 (H) 09/21/2018      Assessment & Plan:     1. Diabetes mellitus without complication ([place]) His URKY7C is 6.5% and he was advised to monitor blood glucose daily and document. He was educated on medication side effects and advised to notify clinic. - glipiZIDE (GLUCOTROL) 5 MG tablet; Take 0.5 tablets (2.5 mg total) by mouth daily before breakfast.  Dispense: 30 tablet; Refill: 0  blood glucose meter kit and supplies KIT; Dispense based on patient and insurance preference. Use up to four times daily as directed. (FOR ICD-9 250.00, 250.01).  Dispense: 1 each; Refill: 0   2. Elevated liver enzymes - Labs will be rechecked in 2 months and if elevated will refer to Gastroenterology. - He was encouraged to continue alcohol abstinence.  3. Hearing loss, unspecified hearing loss type, unspecified laterality - He will follow up at [facility] clinic.  4. Tobacco abuse -He  was encouraged on smoking cessation  5. GAD (generalized anxiety disorder) - He will continue to follow up at [facility] in [place].   6. Erectile dysfunction, unspecified erectile dysfunction type - He was encouraged to follow up with Urologist on 11/14/18   Follow-up: Return in about 3 weeks (around 11/07/2018), or if  symptoms worsen or fail to improve.    Diane Hanel Jerold Coombe, NP

## 2018-10-17 NOTE — Patient Instructions (Signed)
DASH Eating Plan DASH stands for "Dietary Approaches to Stop Hypertension." The DASH eating plan is a healthy eating plan that has been shown to reduce high blood pressure (hypertension). It may also reduce your risk for type 2 diabetes, heart disease, and stroke. The DASH eating plan may also help with weight loss. What are tips for following this plan?  General guidelines  Avoid eating more than 2,300 mg (milligrams) of salt (sodium) a day. If you have hypertension, you may need to reduce your sodium intake to 1,500 mg a day.  Limit alcohol intake to no more than 1 drink a day for nonpregnant women and 2 drinks a day for men. One drink equals 12 oz of beer, 5 oz of wine, or 1 oz of hard liquor.  Work with your health care provider to maintain a healthy body weight or to lose weight. Ask what an ideal weight is for you.  Get at least 30 minutes of exercise that causes your heart to beat faster (aerobic exercise) most days of the week. Activities may include walking, swimming, or biking.  Work with your health care provider or diet and nutrition specialist (dietitian) to adjust your eating plan to your individual calorie needs. Reading food labels   Check food labels for the amount of sodium per serving. Choose foods with less than 5 percent of the Daily Value of sodium. Generally, foods with less than 300 mg of sodium per serving fit into this eating plan.  To find whole grains, look for the word "whole" as the first word in the ingredient list. Shopping  Buy products labeled as "low-sodium" or "no salt added."  Buy fresh foods. Avoid canned foods and premade or frozen meals. Cooking  Avoid adding salt when cooking. Use salt-free seasonings or herbs instead of table salt or sea salt. Check with your health care provider or pharmacist before using salt substitutes.  Do not fry foods. Cook foods using healthy methods such as baking, boiling, grilling, and broiling instead.  Cook with  heart-healthy oils, such as olive, canola, soybean, or sunflower oil. Meal planning  Eat a balanced diet that includes: ? 5 or more servings of fruits and vegetables each day. At each meal, try to fill half of your plate with fruits and vegetables. ? Up to 6-8 servings of whole grains each day. ? Less than 6 oz of lean meat, poultry, or fish each day. A 3-oz serving of meat is about the same size as a deck of cards. One egg equals 1 oz. ? 2 servings of low-fat dairy each day. ? A serving of nuts, seeds, or beans 5 times each week. ? Heart-healthy fats. Healthy fats called Omega-3 fatty acids are found in foods such as flaxseeds and coldwater fish, like sardines, salmon, and mackerel.  Limit how much you eat of the following: ? Canned or prepackaged foods. ? Food that is high in trans fat, such as fried foods. ? Food that is high in saturated fat, such as fatty meat. ? Sweets, desserts, sugary drinks, and other foods with added sugar. ? Full-fat dairy products.  Do not salt foods before eating.  Try to eat at least 2 vegetarian meals each week.  Eat more home-cooked food and less restaurant, buffet, and fast food.  When eating at a restaurant, ask that your food be prepared with less salt or no salt, if possible. What foods are recommended? The items listed may not be a complete list. Talk with your dietitian about   what dietary choices are best for you. Grains Whole-grain or whole-wheat bread. Whole-grain or whole-wheat pasta. Brown rice. Oatmeal. Quinoa. Bulgur. Whole-grain and low-sodium cereals. Pita bread. Low-fat, low-sodium crackers. Whole-wheat flour tortillas. Vegetables Fresh or frozen vegetables (raw, steamed, roasted, or grilled). Low-sodium or reduced-sodium tomato and vegetable juice. Low-sodium or reduced-sodium tomato sauce and tomato paste. Low-sodium or reduced-sodium canned vegetables. Fruits All fresh, dried, or frozen fruit. Canned fruit in natural juice (without  added sugar). Meat and other protein foods Skinless chicken or turkey. Ground chicken or turkey. Pork with fat trimmed off. Fish and seafood. Egg whites. Dried beans, peas, or lentils. Unsalted nuts, nut butters, and seeds. Unsalted canned beans. Lean cuts of beef with fat trimmed off. Low-sodium, lean deli meat. Dairy Low-fat (1%) or fat-free (skim) milk. Fat-free, low-fat, or reduced-fat cheeses. Nonfat, low-sodium ricotta or cottage cheese. Low-fat or nonfat yogurt. Low-fat, low-sodium cheese. Fats and oils Soft margarine without trans fats. Vegetable oil. Low-fat, reduced-fat, or light mayonnaise and salad dressings (reduced-sodium). Canola, safflower, olive, soybean, and sunflower oils. Avocado. Seasoning and other foods Herbs. Spices. Seasoning mixes without salt. Unsalted popcorn and pretzels. Fat-free sweets. What foods are not recommended? The items listed may not be a complete list. Talk with your dietitian about what dietary choices are best for you. Grains Baked goods made with fat, such as croissants, muffins, or some breads. Dry pasta or rice meal packs. Vegetables Creamed or fried vegetables. Vegetables in a cheese sauce. Regular canned vegetables (not low-sodium or reduced-sodium). Regular canned tomato sauce and paste (not low-sodium or reduced-sodium). Regular tomato and vegetable juice (not low-sodium or reduced-sodium). Pickles. Olives. Fruits Canned fruit in a light or heavy syrup. Fried fruit. Fruit in cream or butter sauce. Meat and other protein foods Fatty cuts of meat. Ribs. Fried meat. Bacon. Sausage. Bologna and other processed lunch meats. Salami. Fatback. Hotdogs. Bratwurst. Salted nuts and seeds. Canned beans with added salt. Canned or smoked fish. Whole eggs or egg yolks. Chicken or turkey with skin. Dairy Whole or 2% milk, cream, and half-and-half. Whole or full-fat cream cheese. Whole-fat or sweetened yogurt. Full-fat cheese. Nondairy creamers. Whipped toppings.  Processed cheese and cheese spreads. Fats and oils Butter. Stick margarine. Lard. Shortening. Ghee. Bacon fat. Tropical oils, such as coconut, palm kernel, or palm oil. Seasoning and other foods Salted popcorn and pretzels. Onion salt, garlic salt, seasoned salt, table salt, and sea salt. Worcestershire sauce. Tartar sauce. Barbecue sauce. Teriyaki sauce. Soy sauce, including reduced-sodium. Steak sauce. Canned and packaged gravies. Fish sauce. Oyster sauce. Cocktail sauce. Horseradish that you find on the shelf. Ketchup. Mustard. Meat flavorings and tenderizers. Bouillon cubes. Hot sauce and Tabasco sauce. Premade or packaged marinades. Premade or packaged taco seasonings. Relishes. Regular salad dressings. Where to find more information:  National Heart, Lung, and Blood Institute: www.nhlbi.nih.gov  American Heart Association: www.heart.org Summary  The DASH eating plan is a healthy eating plan that has been shown to reduce high blood pressure (hypertension). It may also reduce your risk for type 2 diabetes, heart disease, and stroke.  With the DASH eating plan, you should limit salt (sodium) intake to 2,300 mg a day. If you have hypertension, you may need to reduce your sodium intake to 1,500 mg a day.  When on the DASH eating plan, aim to eat more fresh fruits and vegetables, whole grains, lean proteins, low-fat dairy, and heart-healthy fats.  Work with your health care provider or diet and nutrition specialist (dietitian) to adjust your eating plan to your   individual calorie needs. This information is not intended to replace advice given to you by your health care provider. Make sure you discuss any questions you have with your health care provider. Document Released: 03/25/2011 Document Revised: 03/18/2017 Document Reviewed: 03/29/2016 Elsevier Patient Education  2020 [place] for Diabetes Mellitus, Adult  Carbohydrate counting is a method of keeping track of  how many carbohydrates you eat. Eating carbohydrates naturally increases the amount of sugar (glucose) in the blood. Counting how many carbohydrates you eat helps keep your blood glucose within normal limits, which helps you manage your diabetes (diabetes mellitus). It is important to know how many carbohydrates you can safely have in each meal. This is different for every person. A diet and nutrition specialist (registered dietitian) can help you make a meal plan and calculate how many carbohydrates you should have at each meal and snack. Carbohydrates are found in the following foods:  Grains, such as breads and cereals.  Dried beans and soy products.  Starchy vegetables, such as potatoes, peas, and corn.  Fruit and fruit juices.  Milk and yogurt.  Sweets and snack foods, such as cake, cookies, candy, chips, and soft drinks. How do I count carbohydrates? There are two ways to count carbohydrates in food. You can use either of the methods or a combination of both. Reading "Nutrition Facts" on packaged food The "Nutrition Facts" list is included on the labels of almost all packaged foods and beverages in the [place] It includes:  The serving size.  Information about nutrients in each serving, including the grams (g) of carbohydrate per serving. To use the "Nutrition Facts":  Decide how many servings you will have.  Multiply the number of servings by the number of carbohydrates per serving.  The resulting number is the total amount of carbohydrates that you will be having. Learning standard serving sizes of other foods When you eat carbohydrate foods that are not packaged or do not include "Nutrition Facts" on the label, you need to measure the servings in order to count the amount of carbohydrates:  Measure the foods that you will eat with a food scale or measuring cup, if needed.  Decide how many standard-size servings you will eat.  Multiply the number of servings by 15. Most  carbohydrate-rich foods have about 15 g of carbohydrates per serving. ? For example, if you eat 8 oz (170 g) of strawberries, you will have eaten 2 servings and 30 g of carbohydrates (2 servings x 15 g = 30 g).  For foods that have more than one food mixed, such as soups and casseroles, you must count the carbohydrates in each food that is included. The following list contains standard serving sizes of common carbohydrate-rich foods. Each of these servings has about 15 g of carbohydrates:   hamburger bun or  English muffin.   oz (15 mL) syrup.   oz (14 g) jelly.  1 slice of bread.  1 six-inch tortilla.  3 oz (85 g) cooked rice or pasta.  4 oz (113 g) cooked dried beans.  4 oz (113 g) starchy vegetable, such as peas, corn, or potatoes.  4 oz (113 g) hot cereal.  4 oz (113 g) mashed potatoes or  of a large baked potato.  4 oz (113 g) canned or frozen fruit.  4 oz (120 mL) fruit juice.  4-6 crackers.  6 chicken nuggets.  6 oz (170 g) unsweetened dry cereal.  6 oz (170 g) plain fat-free yogurt or  yogurt sweetened with artificial sweeteners.  8 oz (240 mL) milk.  8 oz (170 g) fresh fruit or one small piece of fruit.  24 oz (680 g) popped popcorn. Example of carbohydrate counting Sample meal  3 oz (85 g) chicken breast.  6 oz (170 g) brown rice.  4 oz (113 g) corn.  8 oz (240 mL) milk.  8 oz (170 g) strawberries with sugar-free whipped topping. Carbohydrate calculation 1. Identify the foods that contain carbohydrates: ? Rice. ? Corn. ? Milk. ? Strawberries. 2. Calculate how many servings you have of each food: ? 2 servings rice. ? 1 serving corn. ? 1 serving milk. ? 1 serving strawberries. 3. Multiply each number of servings by 15 g: ? 2 servings rice x 15 g = 30 g. ? 1 serving corn x 15 g = 15 g. ? 1 serving milk x 15 g = 15 g. ? 1 serving strawberries x 15 g = 15 g. 4. Add together all of the amounts to find the total grams of carbohydrates  eaten: ? 30 g + 15 g + 15 g + 15 g = 75 g of carbohydrates total. Summary  Carbohydrate counting is a method of keeping track of how many carbohydrates you eat.  Eating carbohydrates naturally increases the amount of sugar (glucose) in the blood.  Counting how many carbohydrates you eat helps keep your blood glucose within normal limits, which helps you manage your diabetes.  A diet and nutrition specialist (registered dietitian) can help you make a meal plan and calculate how many carbohydrates you should have at each meal and snack. This information is not intended to replace advice given to you by your health care provider. Make sure you discuss any questions you have with your health care provider. Document Released: 04/05/2005 Document Revised: 10/28/2016 Document Reviewed: 09/17/2015 Elsevier Patient Education  2020 [employer].

## 2018-11-02 ENCOUNTER — Other Ambulatory Visit: Payer: Self-pay

## 2018-11-02 DIAGNOSIS — H9190 Unspecified hearing loss, unspecified ear: Secondary | ICD-10-CM

## 2018-11-08 ENCOUNTER — Ambulatory Visit: Payer: Self-pay | Admitting: Gerontology

## 2018-11-08 ENCOUNTER — Other Ambulatory Visit: Payer: Self-pay

## 2018-11-08 DIAGNOSIS — E785 Hyperlipidemia, unspecified: Secondary | ICD-10-CM

## 2018-11-08 DIAGNOSIS — E119 Type 2 diabetes mellitus without complications: Secondary | ICD-10-CM

## 2018-11-08 DIAGNOSIS — H9190 Unspecified hearing loss, unspecified ear: Secondary | ICD-10-CM

## 2018-11-08 DIAGNOSIS — N529 Male erectile dysfunction, unspecified: Secondary | ICD-10-CM

## 2018-11-08 MED ORDER — GLIPIZIDE 5 MG PO TABS: 2.5000 mg | ORAL_TABLET | Freq: Every day | ORAL | 2 refills | Status: DC

## 2018-11-08 NOTE — Progress Notes (Signed)
Established Patient Office Visit  Subjective:  Patient ID: Larry French, male    DOB: [birth year]  Age: 58 y.o. MRN: [id number]  CC:  Chief Complaint  Patient presents with  . Follow-up    Diabetes  Patient consents to telephone visit and 2 patient identifiers was used to identify patient.  HPI [name] presents for follow up of type 2 diabetes, his last HgbA1c was 6.5% and he continues to take 2.5 mg glipizide daily. He checks his blood glucose bid,and reports that his fasting reading ranges between 90-133 mg/dl, and pre lunch reading  ranges between 90-120 mg/dl.Marland Kitchen He denies peripheral neuropathy, hypo/hyperglycemic symptoms. He states that he's adhering to low concentrated sweet diet, exercising as tolerated and checking his feet daily. He reports that he will call and reschedule ENT appointment for bilateral hearing loss. He will also follow up with the Urologist on 11/14/18 for difficulty in achieving and maintaining erection. He denies chest pain, palpitation, fever, chills and no further concerns.    Past Medical History:  Diagnosis Date  . Alcohol abuse    Recovered x 15 months  . Anxiety   . CA - skin cancer   . CAD, residual CFX LAD disease 06/01/2011   a. s/p inferior ST elevation MI s/p PCI/DES to RCA on 05/29/2011; b. residual LAD and LCx CAD tx'd on 06/02/11 cath with LAD CAD successfully tx'd w/ PCI/DES, LCx managaged medically   . Depression   . Headache(784.0)   . Hyperlipemia   . Hypertension   . Insomnia   . Mental disorder   . MI (myocardial infarction) ([place])   . Polysubstance abuse ([place])    a. etoh, xanax, tobacco  . PTSD (post-traumatic stress disorder)     Past Surgical History:  Procedure Laterality Date  . CARDIAC CATHETERIZATION Bilateral 04/09/2015   Procedure: Coronary Angiogram;  Surgeon: Wellington Hampshire, MD;  Location: [facility];  Service: Cardiovascular;  Laterality: Bilateral;  . CARDIAC CATHETERIZATION [name] 04/09/2015    Procedure: Coronary Stent Intervention;  Surgeon: Wellington Hampshire, MD;  Location: [facility];  Service: Cardiovascular;  Laterality: [name];  . LEFT HEART CATH Right 05/29/2011   Procedure: LEFT HEART CATH;  Surgeon: Leonie Man, MD;  Location: [facility];  Service: Cardiovascular;  Laterality: Right;  . LEFT HEART CATHETERIZATION WITH CORONARY ANGIOGRAM [name] 06/01/2011   Procedure: LEFT HEART CATHETERIZATION WITH CORONARY ANGIOGRAM;  Surgeon: Leonie Man, MD;  Location: [facility] CATH LAB;  Service: Cardiovascular;  Laterality: [name];  relook cath, possible PCI  . TONSILLECTOMY AND ADENOIDECTOMY      Family History  Problem Relation Age of Onset  . Cancer Mother        Non-Hodgkin lymphoma  . Depression Mother   . Anxiety disorder Mother   . Depression Father   . Anxiety disorder Father   . Coronary artery disease Maternal Grandfather 78       Died    Social History   Socioeconomic History  . Marital status: Single    Spouse name: Not on file  . Number of children: 0  . Years of education: Not on file  . Highest education level: Not on file  Occupational History  . Occupation: Electrical engineer: [name]  . Financial resource strain: Not on file  . Food insecurity    Worry: Not on file    Inability: Not on file  . Transportation needs  Medical: Not on file    Non-medical: Not on file  Tobacco Use  . Smoking status: Current Every Day Smoker    Packs/day: 1.00    Years: 30.00    Pack years: 30.00    Types: Cigarettes  . Smokeless tobacco: Never Used  Substance and Sexual Activity  . Alcohol use: No    Frequency: Never    Comment: Previous user   . Drug use: No  . Sexual activity: Never  Lifestyle  . Physical activity    Days per week: Not on file    Minutes per session: Not on file  . Stress: Not on file  Relationships  . Social Herbalist on phone: Not on file    Gets together: Not on file    Attends  religious service: Not on file    Active member of club or organization: Not on file    Attends meetings of clubs or organizations: Not on file    Relationship status: Not on file  . Intimate partner violence    Fear of current or ex partner: Not on file    Emotionally abused: Not on file    Physically abused: Not on file    Forced sexual activity: Not on file  Other Topics Concern  . Not on file  Social History Narrative  . Not on file    Outpatient Medications Prior to Visit  Medication Sig Dispense Refill  . aspirin 81 MG EC tablet TAKE ONE TABLET BY MOUTH EVERY DAY 90 tablet 0  . blood glucose meter kit and supplies KIT Dispense based on patient and insurance preference. Use up to four times daily as directed. (FOR ICD-9 250.00, 250.01). 1 each 0  . clonazePAM (KLONOPIN) 1 MG tablet Take 1 mg by mouth daily. 1/2 - 2 daily    . clopidogrel (PLAVIX) 75 MG tablet Take 1 tablet (75 mg total) by mouth daily. 90 tablet 1  . gabapentin (NEURONTIN) 600 MG tablet Take 600 mg by mouth 2 (two) times daily.     . hydrOXYzine (ATARAX/VISTARIL) 50 MG tablet Take 1 tablet (50 mg total) by mouth every 6 (six) hours as needed for anxiety. 30 tablet 0  . metoprolol tartrate (LOPRESSOR) 25 MG tablet Take 1 tablet (25 mg total) by mouth 2 (two) times daily. 60 tablet 3  . QUEtiapine (SEROQUEL) 200 MG tablet Take 200 mg by mouth at bedtime. 1/2 - 2 tablets at bedtime as needed    . rosuvastatin (CRESTOR) 20 MG tablet Take 1 tablet (20 mg total) by mouth daily. 30 tablet 3  . traZODone (DESYREL) 100 MG tablet Take 200 mg by mouth at bedtime. 1-2 tablets    . glipiZIDE (GLUCOTROL) 5 MG tablet Take 0.5 tablets (2.5 mg total) by mouth daily before breakfast. 30 tablet 0  . losartan (COZAAR) 25 MG tablet Take 1 tablet (25 mg total) by mouth daily for 30 days. 30 tablet 2   No facility-administered medications prior to visit.     No Known Allergies  ROS Review of Systems  Constitutional: Negative.    HENT: Positive for hearing loss.   Respiratory: Negative.   Cardiovascular: Negative.   Gastrointestinal: Negative.   Endocrine: Negative.   Genitourinary: Negative.   Musculoskeletal: Negative.   Skin: Negative.   Neurological: Negative.   Psychiatric/Behavioral: Negative.       Objective:    Physical Exam No vital sign or PE was done There were no vitals taken for this  visit. Wt Readings from Last 3 Encounters:  10/17/18 216 lb (98 kg)  09/26/18 220 lb (99.8 kg)  03/15/18 225 lb 9.6 oz (102.3 kg)     Health Maintenance Due  Topic Date Due  . Hepatitis C Screening  1960/08/03  . PNEUMOCOCCAL POLYSACCHARIDE VACCINE AGE 57-64 HIGH RISK  01/14/1964  . FOOT EXAM  01/14/1972  . URINE MICROALBUMIN  01/14/1972  . HIV Screening  01/13/1977  . TETANUS/TDAP  01/13/1981  . COLONOSCOPY  01/14/2012  . OPHTHALMOLOGY EXAM  11/25/2017    There are no preventive care reminders to display for this patient.  Lab Results  Component Value Date   TSH 2.790 03/02/2018   Lab Results  Component Value Date   WBC 10.3 03/02/2018   HGB 17.7 03/02/2018   HCT 50.2 03/02/2018   MCV 86 03/02/2018   PLT 254 03/02/2018   Lab Results  Component Value Date   NA 141 09/21/2018   K 4.0 09/21/2018   CO2 21 09/21/2018   GLUCOSE 78 09/21/2018   BUN 12 09/21/2018   CREATININE 1.27 09/21/2018   BILITOT <0.2 09/21/2018   ALKPHOS 84 09/21/2018   AST 54 (H) 09/21/2018   ALT 58 (H) 09/21/2018   PROT 6.5 09/21/2018   ALBUMIN 4.3 09/21/2018   CALCIUM 9.3 09/21/2018   ANIONGAP 14 06/18/2015   Lab Results  Component Value Date   CHOL 190 09/21/2018   Lab Results  Component Value Date   HDL 34 (L) 09/21/2018   Lab Results  Component Value Date   LDLCALC 112 (H) 09/21/2018   Lab Results  Component Value Date   TRIG 221 (H) 09/21/2018   Lab Results  Component Value Date   CHOLHDL 5.6 (H) 09/21/2018   Lab Results  Component Value Date   HGBA1C 6.5 (H) 09/21/2018       Assessment & Plan:     1. Diabetes mellitus without complication ([place]) - He will continue on current treatment regimen, and his fasting blood glucose goal is between 80-130. He was advised to notify provider is fasting blood glucose is continuously greater than 130 mg/dl. -Use Diabetic diet as advised  -Check blood sugar once before breakfast and 2 hours after lunch, write down the numbers against date in a log, bring log to clinic every visit -Take medications regularly as advised -Regular exercise - glipiZIDE (GLUCOTROL) 5 MG tablet; Take 0.5 tablets (2.5 mg total) by mouth daily before breakfast.  Dispense: 30 tablet; Refill: 2 - Comp Met (CMET); Future - HgB A1c; Future - Urine Microalbumin w/creat. ratio; Future  2. Erectile dysfunction, unspecified erectile dysfunction type - He will follow up with Urologist on 11/14/2018  3. Hearing loss, unspecified hearing loss type, unspecified laterality - He was encouraged to call and schedule appointment with [facility] ENT clinic.  4. Elevated lipids - He was advised to continue on current treatment regimen. -Low fat Diet, like low fat dairy products eg skimmed milk -Avoid any fried food -Regular exercise/walk -Goal for Total Cholesterol is less than 200 -Goal for bad cholesterol LDL is less than 70 -Goal for Good cholesterol HDL is more than 45 -Goal for Triglyceride is less than 150 - Lipid panel; Future   Follow-up: Return in about 8 weeks (around 01/01/2019), or if symptoms worsen or fail to improve.    Truman Aceituno Jerold Coombe, NP

## 2018-11-08 NOTE — Patient Instructions (Signed)
Carbohydrate Counting for Diabetes Mellitus, Adult  Carbohydrate counting is a method of keeping track of how many carbohydrates you eat. Eating carbohydrates naturally increases the amount of sugar (glucose) in the blood. Counting how many carbohydrates you eat helps keep your blood glucose within normal limits, which helps you manage your diabetes (diabetes mellitus). It is important to know how many carbohydrates you can safely have in each meal. This is different for every person. A diet and nutrition specialist (registered dietitian) can help you make a meal plan and calculate how many carbohydrates you should have at each meal and snack. Carbohydrates are found in the following foods:  Grains, such as breads and cereals.  Dried beans and soy products.  Starchy vegetables, such as potatoes, peas, and corn.  Fruit and fruit juices.  Milk and yogurt.  Sweets and snack foods, such as cake, cookies, candy, chips, and soft drinks. How do I count carbohydrates? There are two ways to count carbohydrates in food. You can use either of the methods or a combination of both. Reading "Nutrition Facts" on packaged food The "Nutrition Facts" list is included on the labels of almost all packaged foods and beverages in the [place] It includes:  The serving size.  Information about nutrients in each serving, including the grams (g) of carbohydrate per serving. To use the "Nutrition Facts":  Decide how many servings you will have.  Multiply the number of servings by the number of carbohydrates per serving.  The resulting number is the total amount of carbohydrates that you will be having. Learning standard serving sizes of other foods When you eat carbohydrate foods that are not packaged or do not include "Nutrition Facts" on the label, you need to measure the servings in order to count the amount of carbohydrates:  Measure the foods that you will eat with a food scale or measuring cup, if needed.   Decide how many standard-size servings you will eat.  Multiply the number of servings by 15. Most carbohydrate-rich foods have about 15 g of carbohydrates per serving. ? For example, if you eat 8 oz (170 g) of strawberries, you will have eaten 2 servings and 30 g of carbohydrates (2 servings x 15 g = 30 g).  For foods that have more than one food mixed, such as soups and casseroles, you must count the carbohydrates in each food that is included. The following list contains standard serving sizes of common carbohydrate-rich foods. Each of these servings has about 15 g of carbohydrates:   hamburger bun or  English muffin.   oz (15 mL) syrup.   oz (14 g) jelly.  1 slice of bread.  1 six-inch tortilla.  3 oz (85 g) cooked rice or pasta.  4 oz (113 g) cooked dried beans.  4 oz (113 g) starchy vegetable, such as peas, corn, or potatoes.  4 oz (113 g) hot cereal.  4 oz (113 g) mashed potatoes or  of a large baked potato.  4 oz (113 g) canned or frozen fruit.  4 oz (120 mL) fruit juice.  4-6 crackers.  6 chicken nuggets.  6 oz (170 g) unsweetened dry cereal.  6 oz (170 g) plain fat-free yogurt or yogurt sweetened with artificial sweeteners.  8 oz (240 mL) milk.  8 oz (170 g) fresh fruit or one small piece of fruit.  24 oz (680 g) popped popcorn. Example of carbohydrate counting Sample meal  3 oz (85 g) chicken breast.  6 oz (170 g)   brown rice.  4 oz (113 g) corn.  8 oz (240 mL) milk.  8 oz (170 g) strawberries with sugar-free whipped topping. Carbohydrate calculation 1. Identify the foods that contain carbohydrates: ? Rice. ? Corn. ? Milk. ? Strawberries. 2. Calculate how many servings you have of each food: ? 2 servings rice. ? 1 serving corn. ? 1 serving milk. ? 1 serving strawberries. 3. Multiply each number of servings by 15 g: ? 2 servings rice x 15 g = 30 g. ? 1 serving corn x 15 g = 15 g. ? 1 serving milk x 15 g = 15 g. ? 1 serving  strawberries x 15 g = 15 g. 4. Add together all of the amounts to find the total grams of carbohydrates eaten: ? 30 g + 15 g + 15 g + 15 g = 75 g of carbohydrates total. Summary  Carbohydrate counting is a method of keeping track of how many carbohydrates you eat.  Eating carbohydrates naturally increases the amount of sugar (glucose) in the blood.  Counting how many carbohydrates you eat helps keep your blood glucose within normal limits, which helps you manage your diabetes.  A diet and nutrition specialist (registered dietitian) can help you make a meal plan and calculate how many carbohydrates you should have at each meal and snack. This information is not intended to replace advice given to you by your health care provider. Make sure you discuss any questions you have with your health care provider. Document Released: 04/05/2005 Document Revised: 10/28/2016 Document Reviewed: 09/17/2015 Elsevier Patient Education  2020 Elsevier Inc.  

## 2018-11-14 ENCOUNTER — Encounter: Payer: Self-pay | Admitting: Urology

## 2018-11-14 ENCOUNTER — Other Ambulatory Visit: Payer: Self-pay

## 2018-11-14 ENCOUNTER — Ambulatory Visit (INDEPENDENT_AMBULATORY_CARE_PROVIDER_SITE_OTHER): Payer: Self-pay | Admitting: Urology

## 2018-11-14 VITALS — BP 139/88 | HR 78 | Ht 69.0 in | Wt 222.1 lb

## 2018-11-14 DIAGNOSIS — N5201 Erectile dysfunction due to arterial insufficiency: Secondary | ICD-10-CM

## 2018-11-14 MED ORDER — TADALAFIL 20 MG PO TABS: 20.0000 mg | ORAL_TABLET | Freq: Every day | ORAL | 0 refills | Status: DC | PRN

## 2018-11-14 NOTE — Progress Notes (Signed)
11/14/2018 11:12 AM   Larry French 10-Aug-1960 [id number]  Referring provider: Langston Reusing, NP [place],  [place] [address]  Chief Complaint  Patient presents with  . Erectile Dysfunction    HPI: Larry French is a 58 y.o. male seen in consultation at the request of Larry Amato, NP for evaluation of erectile dysfunction.  He presents with a one-year history of difficulty achieving and maintaining an erection.  His symptoms have been much worse the past 3 months.  He has both difficulty achieving and maintaining an erection.  His erections are not firm enough for penetration.  Prior to onset of his ED he denied pain or curvature with erections.  He has good libido.  He has multiple organic risk factors including coronary artery disease, tobacco abuse, history EtOH abuse, diabetes and medications including losartan and metoprolol.  He denies bothersome lower urinary tract symptoms or prior history of urologic problems.   PMH: Past Medical History:  Diagnosis Date  . Alcohol abuse    Recovered x 15 months  . Anxiety   . CA - skin cancer   . CAD, residual CFX LAD disease 06/01/2011   a. s/p inferior ST elevation MI s/p PCI/DES to RCA on 05/29/2011; b. residual LAD and LCx CAD tx'd on 06/02/11 cath with LAD CAD successfully tx'd w/ PCI/DES, LCx managaged medically   . Depression   . Headache(784.0)   . Hyperlipemia   . Hypertension   . Insomnia   . Mental disorder   . MI (myocardial infarction) ([place])   . Polysubstance abuse ([place])    a. etoh, xanax, tobacco  . PTSD (post-traumatic stress disorder)     Surgical History: Past Surgical History:  Procedure Laterality Date  . CARDIAC CATHETERIZATION Bilateral 04/09/2015   Procedure: Coronary Angiogram;  Surgeon: Wellington Hampshire, MD;  Location: [place] [facility];  Service: Cardiovascular;  Laterality: Bilateral;  . CARDIAC CATHETERIZATION [name] 04/09/2015   Procedure: Coronary  Stent Intervention;  Surgeon: Wellington Hampshire, MD;  Location: [place] [facility];  Service: Cardiovascular;  Laterality: [name];  . LEFT HEART CATH Right 05/29/2011   Procedure: LEFT HEART CATH;  Surgeon: Leonie Man, MD;  Location: [facility] CATH LAB;  Service: Cardiovascular;  Laterality: Right;  . LEFT HEART CATHETERIZATION WITH CORONARY ANGIOGRAM [name] 06/01/2011   Procedure: LEFT HEART CATHETERIZATION WITH CORONARY ANGIOGRAM;  Surgeon: Leonie Man, MD;  Location: [facility] CATH LAB;  Service: Cardiovascular;  Laterality: [name];  relook cath, possible PCI  . TONSILLECTOMY AND ADENOIDECTOMY      Home Medications:  Allergies as of 11/14/2018   No Known Allergies     Medication List       Accurate as of November 14, 2018 11:12 AM. If you have any questions, ask your nurse or doctor.        aspirin 81 MG EC tablet TAKE ONE TABLET BY MOUTH EVERY DAY   blood glucose meter kit and supplies Kit Dispense based on patient and insurance preference. Use up to four times daily as directed. (FOR ICD-9 250.00, 250.01).   clonazePAM 1 MG tablet Commonly known as: KLONOPIN Take 1 mg by mouth daily. 1/2 - 2 daily   clopidogrel 75 MG tablet Commonly known as: PLAVIX Take 1 tablet (75 mg total) by mouth daily.   gabapentin 600 MG tablet Commonly known as: NEURONTIN Take 600 mg by mouth 2 (two) times daily.   glipiZIDE 5 MG tablet Commonly known as:  GLUCOTROL Take 0.5 tablets (2.5 mg total) by mouth daily before breakfast.   hydrOXYzine 50 MG tablet Commonly known as: ATARAX/VISTARIL Take 1 tablet (50 mg total) by mouth every 6 (six) hours as needed for anxiety.   losartan 25 MG tablet Commonly known as: COZAAR Take 1 tablet (25 mg total) by mouth daily for 30 days.   metoprolol tartrate 25 MG tablet Commonly known as: LOPRESSOR Take 1 tablet (25 mg total) by mouth 2 (two) times daily.   QUEtiapine 200 MG tablet Commonly known as: SEROQUEL Take 200 mg by mouth at bedtime. 1/2 - 2 tablets at  bedtime as needed   rosuvastatin 20 MG tablet Commonly known as: CRESTOR Take 1 tablet (20 mg total) by mouth daily.   traZODone 100 MG tablet Commonly known as: DESYREL Take 200 mg by mouth at bedtime. 1-2 tablets       Allergies: No Known Allergies  Family History: Family History  Problem Relation Age of Onset  . Cancer Mother        Non-Hodgkin lymphoma  . Depression Mother   . Anxiety disorder Mother   . Depression Father   . Anxiety disorder Father   . Coronary artery disease Maternal Grandfather 56       Died    Social History:  reports that he has been smoking cigarettes. He has a 30.00 pack-year smoking history. He has never used smokeless tobacco. He reports that he does not drink alcohol or use drugs.  ROS: UROLOGY Frequent Urination?: No Hard to postpone urination?: No Burning/pain with urination?: No Get up at night to urinate?: No Leakage of urine?: No Urine stream starts and stops?: No Trouble starting stream?: Yes Do you have to strain to urinate?: No Blood in urine?: No Urinary tract infection?: No Sexually transmitted disease?: No Injury to kidneys or bladder?: No Painful intercourse?: No Weak stream?: No Erection problems?: No Penile pain?: No  Gastrointestinal Nausea?: No Vomiting?: No Indigestion/heartburn?: No Diarrhea?: No Constipation?: No  Constitutional Fever: No Night sweats?: No Weight loss?: No Fatigue?: No  Skin Skin rash/lesions?: No Itching?: No  Eyes Blurred vision?: No Double vision?: No  Ears/Nose/Throat Sore throat?: No Sinus problems?: No  Hematologic/Lymphatic Swollen glands?: No Easy bruising?: No  Cardiovascular Leg swelling?: No Chest pain?: No  Respiratory Cough?: No Shortness of breath?: No  Endocrine Excessive thirst?: No  Musculoskeletal Back pain?: No Joint pain?: No  Neurological Headaches?: No Dizziness?: No  Psychologic Depression?: Yes Anxiety?: Yes  Physical Exam: BP  139/88 (BP Location: Left Arm, Patient Position: Sitting, Cuff Size: Large)   Pulse 78   Ht _0  (1.753 m)   Wt 222 lb 1.6 oz (100.7 kg)   BMI 32.80 kg/m   Constitutional:  Alert and oriented, No acute distress. HEENT: [place] AT, moist mucus membranes.  Trachea midline, no masses. Cardiovascular: No clubbing, cyanosis, or edema. Respiratory: Normal respiratory effort, no increased work of breathing. GI: Abdomen is soft, nontender, nondistended, no abdominal masses GU: No CVA tenderness.  Penis without lesions, testes descended bilaterally without masses or tenderness, spermatic cord/epididymis palpably normal bilaterally. Lymph: No cervical or inguinal lymphadenopathy. Skin: No rashes, bruises or suspicious lesions. Neurologic: Grossly intact, no focal deficits, moving all 4 extremities. Psychiatric: Normal mood and affect.   Assessment & Plan:   I discussed management options with Mr. Niday including PDE 5 inhibitors, intracavernosal injections and vacuum erection devices.  He was initially interested in a trial of PDE 5 inhibitor and he was given Rx  tadalafil 20 mg.  It was recommended he try this medication on at least 5 occasions.  He will call back regarding efficacy.   Abbie Sons, [facility] [address] [place], [place] [address] [phone]

## 2018-11-20 ENCOUNTER — Telehealth: Payer: Self-pay | Admitting: Pharmacy Technician

## 2018-11-20 NOTE — Telephone Encounter (Signed)
Received updated proof of income.  Patient still needs to provide 2019 Federal Tax Return.  Patient understands that [facility] has temporarily approved him and that he needs to provide 2019 Federal Tax Return by 12/19/18.  Failure to provide requested documentation will prevent [facility] from providing medication assistance.  [facility]

## 2018-12-14 ENCOUNTER — Telehealth: Payer: Self-pay | Admitting: Urology

## 2018-12-14 NOTE — Telephone Encounter (Signed)
Please advise on Viagra.

## 2018-12-14 NOTE — Telephone Encounter (Signed)
Pt called and asked if he could have a RX of Viagra. He states that the Cialis did not work to his satisfaction. He asked if it could be called into Medication Management.

## 2018-12-15 ENCOUNTER — Telehealth: Payer: Self-pay | Admitting: Urology

## 2018-12-15 MED ORDER — SILDENAFIL CITRATE 50 MG PO TABS: ORAL_TABLET | ORAL | 0 refills | Status: DC

## 2018-12-15 MED ORDER — SILDENAFIL CITRATE 20 MG PO TABS: ORAL_TABLET | ORAL | 0 refills | Status: DC

## 2018-12-15 NOTE — Telephone Encounter (Signed)
Received a call from the pharmacy about the prescription you sent in for his Sildenafil She said that they only carry 50mg  of this and they only have a small amount of this. They have Tadalafil 20mg  and if you want to change it to that they can fill it. She said either way she will need a new script sent over for whatever you decide to do for him.  Thanks, Sharyn Lull

## 2019-01-16 ENCOUNTER — Telehealth: Payer: Self-pay | Admitting: Gerontology

## 2019-01-16 NOTE — Telephone Encounter (Signed)
Left vm on 9/29 @ 1:31 pm. Asked pt to call back to schedule lab and f/u.

## 2019-01-25 ENCOUNTER — Other Ambulatory Visit: Payer: Self-pay

## 2019-01-31 ENCOUNTER — Ambulatory Visit: Payer: Self-pay | Admitting: Gerontology

## 2019-02-21 ENCOUNTER — Other Ambulatory Visit: Payer: Self-pay

## 2019-02-21 ENCOUNTER — Other Ambulatory Visit: Payer: Medicaid Other

## 2019-02-21 DIAGNOSIS — E785 Hyperlipidemia, unspecified: Secondary | ICD-10-CM

## 2019-02-21 DIAGNOSIS — E119 Type 2 diabetes mellitus without complications: Secondary | ICD-10-CM

## 2019-02-22 LAB — MICROALBUMIN / CREATININE URINE RATIO
Creatinine, Urine: 84.7 mg/dL
Microalb/Creat Ratio: <4 mg/g creat (ref 0–29)
Microalbumin, Urine: <3 ug/mL

## 2019-02-22 LAB — LIPID PANEL
Chol/HDL Ratio: 3.8 ratio (ref 0.0–5.0)
Cholesterol, Total: 134 mg/dL (ref 100–199)
HDL: 35 mg/dL — ABNORMAL LOW (ref >39)
LDL Chol Calc (NIH): 73 mg/dL (ref 0–99)
Triglycerides: 149 mg/dL (ref 0–149)
VLDL Cholesterol Cal: 26 mg/dL (ref 5–40)

## 2019-02-22 LAB — COMPREHENSIVE METABOLIC PANEL
ALT: 32 IU/L (ref 0–44)
AST: 23 IU/L (ref 0–40)
Albumin/Globulin Ratio: 2.4 — ABNORMAL HIGH (ref 1.2–2.2)
Albumin: 4.5 g/dL (ref 3.8–4.9)
Alkaline Phosphatase: 88 IU/L (ref 39–117)
BUN/Creatinine Ratio: 12 (ref 9–20)
BUN: 12 mg/dL (ref 6–24)
Bilirubin Total: 0.2 mg/dL (ref 0.0–1.2)
CO2: 23 mmol/L (ref 20–29)
Calcium: 9.3 mg/dL (ref 8.7–10.2)
Chloride: 105 mmol/L (ref 96–106)
Creatinine, Ser: 1.03 mg/dL (ref 0.76–1.27)
GFR calc Af Amer: 93 mL/min/{1.73_m2} (ref >59)
GFR calc non Af Amer: 80 mL/min/{1.73_m2} (ref >59)
Globulin, Total: 1.9 g/dL (ref 1.5–4.5)
Glucose: 144 mg/dL — ABNORMAL HIGH (ref 65–99)
Potassium: 4 mmol/L (ref 3.5–5.2)
Sodium: 142 mmol/L (ref 134–144)
Total Protein: 6.4 g/dL (ref 6.0–8.5)

## 2019-02-22 LAB — HEMOGLOBIN A1C
Est. average glucose Bld gHb Est-mCnc: 131 mg/dL
Hgb A1c MFr Bld: 6.2 % — ABNORMAL HIGH (ref 4.8–5.6)

## 2019-02-28 ENCOUNTER — Ambulatory Visit: Payer: Medicaid Other | Admitting: Gerontology

## 2019-02-28 ENCOUNTER — Other Ambulatory Visit: Payer: Self-pay

## 2019-02-28 ENCOUNTER — Encounter: Payer: Self-pay | Admitting: Gerontology

## 2019-02-28 VITALS — BP 146/87 | HR 62 | Temp 97.5°F | Ht 68.0 in | Wt 230.0 lb

## 2019-02-28 DIAGNOSIS — E119 Type 2 diabetes mellitus without complications: Secondary | ICD-10-CM

## 2019-02-28 DIAGNOSIS — I10 Essential (primary) hypertension: Secondary | ICD-10-CM

## 2019-02-28 DIAGNOSIS — D229 Melanocytic nevi, unspecified: Secondary | ICD-10-CM | POA: Insufficient documentation

## 2019-02-28 DIAGNOSIS — N529 Male erectile dysfunction, unspecified: Secondary | ICD-10-CM

## 2019-02-28 DIAGNOSIS — Z0000 Encounter for general adult medical examination without abnormal findings: Secondary | ICD-10-CM

## 2019-02-28 MED ORDER — LOSARTAN POTASSIUM 25 MG PO TABS: 25.0000 mg | ORAL_TABLET | Freq: Every day | ORAL | 2 refills | Status: DC

## 2019-02-28 NOTE — Progress Notes (Signed)
Established Patient Office Visit  Subjective:  Patient ID: Larry French, male    DOB: [birth year]  Age: 58 y.o. MRN: [id number]  CC:  Chief Complaint  Patient presents with  . Diabetes    HPI Larry French presents for follow up of type 2 diabetes, his last HgbA1c was 6.2% and he continues to take 2.5 mg glipizide daily. He states that he checks his blood glucose sparingly, denies peripheral neuropathy and hypo/hyperglycemic symptoms. He states that he's adhering to low concentrated sweet diet, exercising as tolerated and checking his feet daily. He was [facility] at [facility] on 12/29/2018 for left hearing loss,  And he agreed to try hearing aids and a referral was placed by Dr Owens Shark. He was also seen by the Urologist Dr Bernardo Heater on 11/14/18 for difficulty in achieving and maintaining erection. He was prescribed Tadalafil 20 mg, and  he states that he didn't notice any improvements but will wait until he's in a serious relationship. He reports of having a fleshy mole to his right cheek, stating that he's concerned because he has being using a tanning bed.  He requests Pneumonia vaccine and Colonoscopy screening. He denies chest pain, palpitation, fever, chills and offers no further concerns.  Past Medical History:  Diagnosis Date  . Alcohol abuse    Recovered x 15 months  . Anxiety   . CA - skin cancer   . CAD, residual CFX LAD disease 06/01/2011   a. s/p inferior ST elevation MI s/p PCI/DES to RCA on 05/29/2011; b. residual LAD and LCx CAD tx'd on 06/02/11 cath with LAD CAD successfully tx'd w/ PCI/DES, LCx managaged medically   . Depression   . Headache(784.0)   . Hyperlipemia   . Hypertension   . Insomnia   . Mental disorder   . MI (myocardial infarction) ([place])   . Polysubstance abuse ([place])    a. etoh, xanax, tobacco  . PTSD (post-traumatic stress disorder)     Past Surgical History:  Procedure Laterality Date  . CARDIAC CATHETERIZATION Bilateral 04/09/2015   Procedure: Coronary Angiogram;  Surgeon: Wellington Hampshire, MD;  Location: [facility];  Service: Cardiovascular;  Laterality: Bilateral;  . CARDIAC CATHETERIZATION [name] 04/09/2015   Procedure: Coronary Stent Intervention;  Surgeon: Wellington Hampshire, MD;  Location: [place];  Service: Cardiovascular;  Laterality: [name];  . LEFT HEART CATH Right 05/29/2011   Procedure: LEFT HEART CATH;  Surgeon: Leonie Man, MD;  Location: [facility] CATH LAB;  Service: Cardiovascular;  Laterality: Right;  . LEFT HEART CATHETERIZATION WITH CORONARY ANGIOGRAM [name] 06/01/2011   Procedure: LEFT HEART CATHETERIZATION WITH CORONARY ANGIOGRAM;  Surgeon: Leonie Man, MD;  Location: [facility] CATH LAB;  Service: Cardiovascular;  Laterality: [name];  relook cath, possible PCI  . TONSILLECTOMY AND ADENOIDECTOMY      Family History  Problem Relation Age of Onset  . Cancer Mother        Non-Hodgkin lymphoma  . Depression Mother   . Anxiety disorder Mother   . Depression Father   . Anxiety disorder Father   . Coronary artery disease Maternal Grandfather 10       Died    Social History   Socioeconomic History  . Marital status: Single    Spouse name: Not on file  . Number of children: 0  . Years of education: Not on file  . Highest education level: Not on file  Occupational History  . Occupation: Electrical engineer:  [employer]  Social Needs  . Financial resource strain: Not on file  . Food insecurity    Worry: Not on file    Inability: Not on file  . Transportation needs    Medical: Not on file    Non-medical: Not on file  Tobacco Use  . Smoking status: Current Every Day Smoker    Packs/day: 1.00    Years: 30.00    Pack years: 30.00    Types: Cigarettes  . Smokeless tobacco: Never Used  Substance and Sexual Activity  . Alcohol use: No    Frequency: Never    Comment: Previous user   . Drug use: No  . Sexual activity: Not Currently  Lifestyle  . Physical activity    Days per  week: Not on file    Minutes per session: Not on file  . Stress: Not on file  Relationships  . Social Herbalist on phone: Not on file    Gets together: Not on file    Attends religious service: Not on file    Active member of club or organization: Not on file    Attends meetings of clubs or organizations: Not on file    Relationship status: Not on file  . Intimate partner violence    Fear of current or ex partner: Not on file    Emotionally abused: Not on file    Physically abused: Not on file    Forced sexual activity: Not on file  Other Topics Concern  . Not on file  Social History Narrative  . Not on file    Outpatient Medications Prior to Visit  Medication Sig Dispense Refill  . aspirin 81 MG EC tablet TAKE ONE TABLET BY MOUTH EVERY DAY 90 tablet 0  . clonazePAM (KLONOPIN) 1 MG tablet Take 1 mg by mouth daily. 1/2 - 2 daily    . clopidogrel (PLAVIX) 75 MG tablet Take 1 tablet (75 mg total) by mouth daily. 90 tablet 1  . gabapentin (NEURONTIN) 600 MG tablet Take 600 mg by mouth 2 (two) times daily.     Mar[place] Kitchen glipiZIDE (GLUCOTROL) 5 MG tablet Take 0.5 tablets (2.5 mg total) by mouth daily before breakfast. 30 tablet 2  . hydrOXYzine (ATARAX/VISTARIL) 50 MG tablet Take 1 tablet (50 mg total) by mouth every 6 (six) hours as needed for anxiety. 30 tablet 0  . metoprolol tartrate (LOPRESSOR) 25 MG tablet Take 1 tablet (25 mg total) by mouth 2 (two) times daily. 60 tablet 3  . QUEtiapine (SEROQUEL) 200 MG tablet Take 200 mg by mouth at bedtime. 1/2 - 2 tablets at bedtime as needed    . rosuvastatin (CRESTOR) 20 MG tablet Take 1 tablet (20 mg total) by mouth daily. 30 tablet 3  . traZODone (DESYREL) 100 MG tablet Take 200 mg by mouth at bedtime. 1-2 tablets    . losartan (COZAAR) 25 MG tablet Take 1 tablet (25 mg total) by mouth daily for 30 days. 30 tablet 2  . blood glucose meter kit and supplies KIT Dispense based on patient and insurance preference. Use up to four times  daily as directed. (FOR ICD-9 250.00, 250.01). 1 each 0  . sildenafil (VIAGRA) 50 MG tablet 1-2 tabs 1 hour prior to intercourse 10 tablet 0   No facility-administered medications prior to visit.     No Known Allergies  ROS Review of Systems  Constitutional: Negative.   HENT: Positive for hearing loss (Left ear).   Eyes: Negative.  Respiratory: Negative.   Cardiovascular: Negative.   Endocrine: Negative.   Genitourinary: Negative.   Skin: Negative.        Mole to right cheek  Neurological: Negative.   Psychiatric/Behavioral: Negative.       Objective:    Physical Exam  Constitutional: He is oriented to person, place, and time. He appears well-developed and well-nourished.  HENT:  Head: Normocephalic and atraumatic.    Eyes: Pupils are equal, round, and reactive to light. EOM are normal.  Cardiovascular: Normal rate and regular rhythm.  Pulmonary/Chest: Effort normal and breath sounds normal.  Abdominal: Soft. Bowel sounds are normal. He exhibits distension (Central obesity).  Musculoskeletal: Normal range of motion.  Neurological: He is alert and oriented to person, place, and time.  Skin: Skin is warm and dry.  Psychiatric: He has a normal mood and affect. His behavior is normal. Judgment and thought content normal.    BP (!) 146/87 (BP Location: Right Arm, Patient Position: Sitting)   Pulse 62   Temp (!) 97.5 F (36.4 C)   Ht _0  (1.727 m)   Wt 230 lb (104.3 kg)   SpO2 97%   BMI 34.97 kg/m  Wt Readings from Last 3 Encounters:  02/28/19 230 lb (104.3 kg)  11/14/18 222 lb 1.6 oz (100.7 kg)  10/17/18 216 lb (98 kg)   He was encouraged to continue on weight loss regimen.  Health Maintenance Due  Topic Date Due  . Hepatitis C Screening  Sep 11, 1960  . PNEUMOCOCCAL POLYSACCHARIDE VACCINE AGE 24-64 HIGH RISK  01/14/1964  . HIV Screening  01/13/1977  . TETANUS/TDAP  01/13/1981  . COLONOSCOPY  01/14/2012  . OPHTHALMOLOGY EXAM  11/25/2017    There are no  preventive care reminders to display for this patient.  Lab Results  Component Value Date   TSH 2.790 03/02/2018   Lab Results  Component Value Date   WBC 10.3 03/02/2018   HGB 17.7 03/02/2018   HCT 50.2 03/02/2018   MCV 86 03/02/2018   PLT 254 03/02/2018   Lab Results  Component Value Date   NA 142 02/21/2019   K 4.0 02/21/2019   CO2 23 02/21/2019   GLUCOSE 144 (H) 02/21/2019   BUN 12 02/21/2019   CREATININE 1.03 02/21/2019   BILITOT 0.2 02/21/2019   ALKPHOS 88 02/21/2019   AST 23 02/21/2019   ALT 32 02/21/2019   PROT 6.4 02/21/2019   ALBUMIN 4.5 02/21/2019   CALCIUM 9.3 02/21/2019   ANIONGAP 14 06/18/2015   Lab Results  Component Value Date   CHOL 134 02/21/2019   Lab Results  Component Value Date   HDL 35 (L) 02/21/2019   Lab Results  Component Value Date   LDLCALC 73 02/21/2019   Lab Results  Component Value Date   TRIG 149 02/21/2019   Lab Results  Component Value Date   CHOLHDL 3.8 02/21/2019   Lab Results  Component Value Date   HGBA1C 6.2 (H) 02/21/2019      Assessment & Plan:    1. Essential hypertension -His blood pressure is not controlled, was encouraged to continue on current treatment regimen. -Low salt DASH diet -Take medications regularly on time -Exercise regularly as tolerated -Check blood pressure at least once a week at home or a nearby pharmacy and record -Goal is less than 140/90 and normal blood pressure is 120/80 - losartan (COZAAR) 25 MG tablet; Take 1 tablet (25 mg total) by mouth daily.  Dispense: 30 tablet; Refill: 2  2. Diabetes  mellitus without complication ([place]) - His HgbA1c is decreasing, it was 6.2% and he will continue on current treatment regimen. -Use Diabetic diet as advised  -Check blood sugar 2-3 times a day, once before breakfast and others 2 hours after lunch or dinner,brite down the numbers against date in a log,bring log to clinic every visit -Take medications regularly as advised -Regular exercise as  tolerated - HgB A1c; Future  3. Erectile dysfunction, unspecified erectile dysfunction type - He will continue to follow up with the Urologist Dr Bernardo Heater.  4. Fleshy skin mole - He was encouraged to complete charity care application for - Ambulatory referral to Dermatology  5. Healthcare maintenance - -He was encouraged to complete charity care application for - Ambulatory referral to Gastroenterology for Colonoscopy - Pneumococcal polysaccharide vaccine 23-valent greater than or equal to 2yo subcutaneous/IM was administered.    Follow-up: Return in about 5 months (around 08/14/2019), or if symptoms worsen or fail to improve.    [facility]ward Klem Jerold Coombe, NP

## 2019-02-28 NOTE — Patient Instructions (Signed)
Carbohydrate Counting for Diabetes Mellitus, Adult  Carbohydrate counting is a method of keeping track of how many carbohydrates you eat. Eating carbohydrates naturally increases the amount of sugar (glucose) in the blood. Counting how many carbohydrates you eat helps keep your blood glucose within normal limits, which helps you manage your diabetes (diabetes mellitus). It is important to know how many carbohydrates you can safely have in each meal. This is different for every person. A diet and nutrition specialist (registered dietitian) can help you make a meal plan and calculate how many carbohydrates you should have at each meal and snack. Carbohydrates are found in the following foods:  Grains, such as breads and cereals.  Dried beans and soy products.  Starchy vegetables, such as potatoes, peas, and corn.  Fruit and fruit juices.  Milk and yogurt.  Sweets and snack foods, such as cake, cookies, candy, chips, and soft drinks. How do I count carbohydrates? There are two ways to count carbohydrates in food. You can use either of the methods or a combination of both. Reading "Nutrition Facts" on packaged food The "Nutrition Facts" list is included on the labels of almost all packaged foods and beverages in the [place] It includes:  The serving size.  Information about nutrients in each serving, including the grams (g) of carbohydrate per serving. To use the "Nutrition Facts":  Decide how many servings you will have.  Multiply the number of servings by the number of carbohydrates per serving.  The resulting number is the total amount of carbohydrates that you will be having. Learning standard serving sizes of other foods When you eat carbohydrate foods that are not packaged or do not include "Nutrition Facts" on the label, you need to measure the servings in order to count the amount of carbohydrates:  Measure the foods that you will eat with a food scale or measuring cup, if needed.   Decide how many standard-size servings you will eat.  Multiply the number of servings by 15. Most carbohydrate-rich foods have about 15 g of carbohydrates per serving. ? For example, if you eat 8 oz (170 g) of strawberries, you will have eaten 2 servings and 30 g of carbohydrates (2 servings x 15 g = 30 g).  For foods that have more than one food mixed, such as soups and casseroles, you must count the carbohydrates in each food that is included. The following list contains standard serving sizes of common carbohydrate-rich foods. Each of these servings has about 15 g of carbohydrates:   hamburger bun or  English muffin.   oz (15 mL) syrup.   oz (14 g) jelly.  1 slice of bread.  1 six-inch tortilla.  3 oz (85 g) cooked rice or pasta.  4 oz (113 g) cooked dried beans.  4 oz (113 g) starchy vegetable, such as peas, corn, or potatoes.  4 oz (113 g) hot cereal.  4 oz (113 g) mashed potatoes or  of a large baked potato.  4 oz (113 g) canned or frozen fruit.  4 oz (120 mL) fruit juice.  4-6 crackers.  6 chicken nuggets.  6 oz (170 g) unsweetened dry cereal.  6 oz (170 g) plain fat-free yogurt or yogurt sweetened with artificial sweeteners.  8 oz (240 mL) milk.  8 oz (170 g) fresh fruit or one small piece of fruit.  24 oz (680 g) popped popcorn. Example of carbohydrate counting Sample meal  3 oz (85 g) chicken breast.  6 oz (170 g)   brown rice.  4 oz (113 g) corn.  8 oz (240 mL) milk.  8 oz (170 g) strawberries with sugar-free whipped topping. Carbohydrate calculation 1. Identify the foods that contain carbohydrates: ? Rice. ? Corn. ? Milk. ? Strawberries. 2. Calculate how many servings you have of each food: ? 2 servings rice. ? 1 serving corn. ? 1 serving milk. ? 1 serving strawberries. 3. Multiply each number of servings by 15 g: ? 2 servings rice x 15 g = 30 g. ? 1 serving corn x 15 g = 15 g. ? 1 serving milk x 15 g = 15 g. ? 1 serving  strawberries x 15 g = 15 g. 4. Add together all of the amounts to find the total grams of carbohydrates eaten: ? 30 g + 15 g + 15 g + 15 g = 75 g of carbohydrates total. Summary  Carbohydrate counting is a method of keeping track of how many carbohydrates you eat.  Eating carbohydrates naturally increases the amount of sugar (glucose) in the blood.  Counting how many carbohydrates you eat helps keep your blood glucose within normal limits, which helps you manage your diabetes.  A diet and nutrition specialist (registered dietitian) can help you make a meal plan and calculate how many carbohydrates you should have at each meal and snack. This information is not intended to replace advice given to you by your health care provider. Make sure you discuss any questions you have with your health care provider. Document Released: 04/05/2005 Document Revised: 10/28/2016 Document Reviewed: 09/17/2015 Elsevier Patient Education  2020 Elsevier Inc. DASH Eating Plan DASH stands for "Dietary Approaches to Stop Hypertension." The DASH eating plan is a healthy eating plan that has been shown to reduce high blood pressure (hypertension). It may also reduce your risk for type 2 diabetes, heart disease, and stroke. The DASH eating plan may also help with weight loss. What are tips for following this plan?  General guidelines  Avoid eating more than 2,300 mg (milligrams) of salt (sodium) a day. If you have hypertension, you may need to reduce your sodium intake to 1,500 mg a day.  Limit alcohol intake to no more than 1 drink a day for nonpregnant women and 2 drinks a day for men. One drink equals 12 oz of beer, 5 oz of wine, or 1 oz of hard liquor.  Work with your health care provider to maintain a healthy body weight or to lose weight. Ask what an ideal weight is for you.  Get at least 30 minutes of exercise that causes your heart to beat faster (aerobic exercise) most days of the week. Activities may  include walking, swimming, or biking.  Work with your health care provider or diet and nutrition specialist (dietitian) to adjust your eating plan to your individual calorie needs. Reading food labels   Check food labels for the amount of sodium per serving. Choose foods with less than 5 percent of the Daily Value of sodium. Generally, foods with less than 300 mg of sodium per serving fit into this eating plan.  To find whole grains, look for the word "whole" as the first word in the ingredient list. Shopping  Buy products labeled as "low-sodium" or "no salt added."  Buy fresh foods. Avoid canned foods and premade or frozen meals. Cooking  Avoid adding salt when cooking. Use salt-free seasonings or herbs instead of table salt or sea salt. Check with your health care provider or pharmacist before using salt substitutes.    Do not fry foods. Cook foods using healthy methods such as baking, boiling, grilling, and broiling instead.  Cook with heart-healthy oils, such as olive, canola, soybean, or sunflower oil. Meal planning  Eat a balanced diet that includes: ? 5 or more servings of fruits and vegetables each day. At each meal, try to fill half of your plate with fruits and vegetables. ? Up to 6-8 servings of whole grains each day. ? Less than 6 oz of lean meat, poultry, or fish each day. A 3-oz serving of meat is about the same size as a deck of cards. One egg equals 1 oz. ? 2 servings of low-fat dairy each day. ? A serving of nuts, seeds, or beans 5 times each week. ? Heart-healthy fats. Healthy fats called Omega-3 fatty acids are found in foods such as flaxseeds and coldwater fish, like sardines, salmon, and mackerel.  Limit how much you eat of the following: ? Canned or prepackaged foods. ? Food that is high in trans fat, such as fried foods. ? Food that is high in saturated fat, such as fatty meat. ? Sweets, desserts, sugary drinks, and other foods with added sugar. ? Full-fat  dairy products.  Do not salt foods before eating.  Try to eat at least 2 vegetarian meals each week.  Eat more home-cooked food and less restaurant, buffet, and fast food.  When eating at a restaurant, ask that your food be prepared with less salt or no salt, if possible. What foods are recommended? The items listed may not be a complete list. Talk with your dietitian about what dietary choices are best for you. Grains Whole-grain or whole-wheat bread. Whole-grain or whole-wheat pasta. Brown rice. Oatmeal. Quinoa. Bulgur. Whole-grain and low-sodium cereals. Pita bread. Low-fat, low-sodium crackers. Whole-wheat flour tortillas. Vegetables Fresh or frozen vegetables (raw, steamed, roasted, or grilled). Low-sodium or reduced-sodium tomato and vegetable juice. Low-sodium or reduced-sodium tomato sauce and tomato paste. Low-sodium or reduced-sodium canned vegetables. Fruits All fresh, dried, or frozen fruit. Canned fruit in natural juice (without added sugar). Meat and other protein foods Skinless chicken or turkey. Ground chicken or turkey. Pork with fat trimmed off. Fish and seafood. Egg whites. Dried beans, peas, or lentils. Unsalted nuts, nut butters, and seeds. Unsalted canned beans. Lean cuts of beef with fat trimmed off. Low-sodium, lean deli meat. Dairy Low-fat (1%) or fat-free (skim) milk. Fat-free, low-fat, or reduced-fat cheeses. Nonfat, low-sodium ricotta or cottage cheese. Low-fat or nonfat yogurt. Low-fat, low-sodium cheese. Fats and oils Soft margarine without trans fats. Vegetable oil. Low-fat, reduced-fat, or light mayonnaise and salad dressings (reduced-sodium). Canola, safflower, olive, soybean, and sunflower oils. Avocado. Seasoning and other foods Herbs. Spices. Seasoning mixes without salt. Unsalted popcorn and pretzels. Fat-free sweets. What foods are not recommended? The items listed may not be a complete list. Talk with your dietitian about what dietary choices are best  for you. Grains Baked goods made with fat, such as croissants, muffins, or some breads. Dry pasta or rice meal packs. Vegetables Creamed or fried vegetables. Vegetables in a cheese sauce. Regular canned vegetables (not low-sodium or reduced-sodium). Regular canned tomato sauce and paste (not low-sodium or reduced-sodium). Regular tomato and vegetable juice (not low-sodium or reduced-sodium). Pickles. Olives. Fruits Canned fruit in a light or heavy syrup. Fried fruit. Fruit in cream or butter sauce. Meat and other protein foods Fatty cuts of meat. Ribs. Fried meat. Bacon. Sausage. Bologna and other processed lunch meats. Salami. Fatback. Hotdogs. Bratwurst. Salted nuts and seeds. Canned beans with   added salt. Canned or smoked fish. Whole eggs or egg yolks. Chicken or turkey with skin. Dairy Whole or 2% milk, cream, and half-and-half. Whole or full-fat cream cheese. Whole-fat or sweetened yogurt. Full-fat cheese. Nondairy creamers. Whipped toppings. Processed cheese and cheese spreads. Fats and oils Butter. Stick margarine. Lard. Shortening. Ghee. Bacon fat. Tropical oils, such as coconut, palm kernel, or palm oil. Seasoning and other foods Salted popcorn and pretzels. Onion salt, garlic salt, seasoned salt, table salt, and sea salt. Worcestershire sauce. Tartar sauce. Barbecue sauce. Teriyaki sauce. Soy sauce, including reduced-sodium. Steak sauce. Canned and packaged gravies. Fish sauce. Oyster sauce. Cocktail sauce. Horseradish that you find on the shelf. Ketchup. Mustard. Meat flavorings and tenderizers. Bouillon cubes. Hot sauce and Tabasco sauce. Premade or packaged marinades. Premade or packaged taco seasonings. Relishes. Regular salad dressings. Where to find more information:  National Heart, Lung, and Blood Institute: www.nhlbi.nih.gov  American Heart Association: www.heart.org Summary  The DASH eating plan is a healthy eating plan that has been shown to reduce high blood pressure  (hypertension). It may also reduce your risk for type 2 diabetes, heart disease, and stroke.  With the DASH eating plan, you should limit salt (sodium) intake to 2,300 mg a day. If you have hypertension, you may need to reduce your sodium intake to 1,500 mg a day.  When on the DASH eating plan, aim to eat more fresh fruits and vegetables, whole grains, lean proteins, low-fat dairy, and heart-healthy fats.  Work with your health care provider or diet and nutrition specialist (dietitian) to adjust your eating plan to your individual calorie needs. This information is not intended to replace advice given to you by your health care provider. Make sure you discuss any questions you have with your health care provider. Document Released: 03/25/2011 Document Revised: 03/18/2017 Document Reviewed: 03/29/2016 Elsevier Patient Education  2020 Elsevier Inc.  

## 2019-03-02 ENCOUNTER — Other Ambulatory Visit: Payer: Self-pay

## 2019-03-02 ENCOUNTER — Telehealth: Payer: Self-pay

## 2019-03-02 DIAGNOSIS — Z1211 Encounter for screening for malignant neoplasm of colon: Secondary | ICD-10-CM

## 2019-03-02 NOTE — Telephone Encounter (Signed)
Gastroenterology Pre-Procedure Review  Request Date: Monday 03/19/19 Requesting Physician: Dr. Marius Ditch  PATIENT REVIEW QUESTIONS: The patient responded to the following health history questions as indicated:    1. Are you having any GI issues? no 2. Do you have a personal history of Polyps? no 3. Do you have a family history of Colon Cancer or Polyps? no 4. Diabetes Mellitus? no 5. Joint replacements in the past 12 months?no 6. Major health problems in the past 3 months?no 7. Any artificial heart valves, MVP, or defibrillator?no    MEDICATIONS & ALLERGIES:    Patient reports the following regarding taking any anticoagulation/antiplatelet therapy:   Plavix, Coumadin, Eliquis, Xarelto, Lovenox, Pradaxa, Brilinta, or Effient? yes (Plavix Prescribed by Dr. Chauncey Mann) Aspirin? yes (81 mg daily)  Patient confirms/reports the following medications:  Current Outpatient Medications  Medication Sig Dispense Refill  . aspirin 81 MG EC tablet TAKE ONE TABLET BY MOUTH EVERY DAY 90 tablet 0  . blood glucose meter kit and supplies KIT Dispense based on patient and insurance preference. Use up to four times daily as directed. (FOR ICD-9 250.00, 250.01). 1 each 0  . clonazePAM (KLONOPIN) 1 MG tablet Take 1 mg by mouth daily. 1/2 - 2 daily    . clopidogrel (PLAVIX) 75 MG tablet Take 1 tablet (75 mg total) by mouth daily. 90 tablet 1  . gabapentin (NEURONTIN) 600 MG tablet Take 600 mg by mouth 2 (two) times daily.     Marland Kitchen glipiZIDE (GLUCOTROL) 5 MG tablet Take 0.5 tablets (2.5 mg total) by mouth daily before breakfast. 30 tablet 2  . hydrOXYzine (ATARAX/VISTARIL) 50 MG tablet Take 1 tablet (50 mg total) by mouth every 6 (six) hours as needed for anxiety. 30 tablet 0  . losartan (COZAAR) 25 MG tablet Take 1 tablet (25 mg total) by mouth daily. 30 tablet 2  . metoprolol tartrate (LOPRESSOR) 25 MG tablet Take 1 tablet (25 mg total) by mouth 2 (two) times daily. 60 tablet 3  . QUEtiapine (SEROQUEL) 200 MG tablet  Take 200 mg by mouth at bedtime. 1/2 - 2 tablets at bedtime as needed    . rosuvastatin (CRESTOR) 20 MG tablet Take 1 tablet (20 mg total) by mouth daily. 30 tablet 3  . sildenafil (VIAGRA) 50 MG tablet 1-2 tabs 1 hour prior to intercourse 10 tablet 0  . traZODone (DESYREL) 100 MG tablet Take 200 mg by mouth at bedtime. 1-2 tablets     No current facility-administered medications for this visit.     Patient confirms/reports the following allergies:  No Known Allergies  No orders of the defined types were placed in this encounter.   AUTHORIZATION INFORMATION Primary Insurance: 1D#: Group #:  Secondary Insurance: 1D#: Group #:  SCHEDULE INFORMATION: Date: Monday 03/19/19 Time: Location:[facility]

## 2019-03-07 ENCOUNTER — Telehealth: Payer: Self-pay

## 2019-03-07 NOTE — Telephone Encounter (Signed)
Left message with patients wife to ask her husband to stop Plavix 5 days prior to his 03/19/19 colonoscopy, and restart 1 day prior per Dr. Bing Neighbors blood thinner request received.  Thanks [employer]

## 2019-03-09 ENCOUNTER — Other Ambulatory Visit: Payer: Self-pay | Admitting: Gerontology

## 2019-03-09 DIAGNOSIS — E785 Hyperlipidemia, unspecified: Secondary | ICD-10-CM

## 2019-03-12 ENCOUNTER — Other Ambulatory Visit: Payer: Self-pay | Admitting: Gerontology

## 2019-03-12 DIAGNOSIS — E785 Hyperlipidemia, unspecified: Secondary | ICD-10-CM

## 2019-03-13 ENCOUNTER — Telehealth: Payer: Self-pay

## 2019-03-13 NOTE — Telephone Encounter (Signed)
Patient informed Larry French in Endo that he has not received his colonoscopy instructions for Monday's colonoscopy.  Larry French, has lvm for him to call our office back in regards to getting his instructions to him.  We can email instructions to him or we can fax the instructions to his pharmacy and he can pick up with his bowel prep.  Will await for him to call back.  Patient called back and I will send his rx and instructions to prescription management.  Patient has given me permission to do so.  Contacted medication mgmt and they are okay with me sending instructions to provided to patient, however they are completely out of bowel prep solutions.  Patient has been asked to call me back again to advise where he wants his new rx to go to.  Pt called back rx sent to [facility].  Thanks [employer]

## 2019-03-16 ENCOUNTER — Other Ambulatory Visit
Admission: RE | Admit: 2019-03-16 | Discharge: 2019-03-16 | Disposition: A | Discharge status: Home / Self Care | Payer: Medicaid Other | Source: Ambulatory Visit | Attending: Gastroenterology | Admitting: Gastroenterology

## 2019-03-16 DIAGNOSIS — Z20828 Contact with and (suspected) exposure to other viral communicable diseases: Secondary | ICD-10-CM | POA: Diagnosis not present

## 2019-03-16 DIAGNOSIS — Z01812 Encounter for preprocedural laboratory examination: Secondary | ICD-10-CM | POA: Insufficient documentation

## 2019-03-16 LAB — SARS CORONAVIRUS 2 (TAT 6-24 HRS): SARS Coronavirus 2: NEGATIVE

## 2019-03-19 ENCOUNTER — Ambulatory Visit: Payer: Self-pay | Admitting: Anesthesiology

## 2019-03-19 ENCOUNTER — Other Ambulatory Visit: Payer: Self-pay

## 2019-03-19 ENCOUNTER — Encounter
Admission: RE | Disposition: A | Discharge status: Home / Self Care | Payer: Self-pay | Source: Home / Self Care | Attending: Gastroenterology

## 2019-03-19 ENCOUNTER — Ambulatory Visit
Admission: RE | Admit: 2019-03-19 | Discharge: 2019-03-19 | Disposition: A | Discharge status: Home / Self Care | Payer: Self-pay | Attending: Gastroenterology | Admitting: Gastroenterology

## 2019-03-19 DIAGNOSIS — F17210 Nicotine dependence, cigarettes, uncomplicated: Secondary | ICD-10-CM | POA: Insufficient documentation

## 2019-03-19 DIAGNOSIS — Z955 Presence of coronary angioplasty implant and graft: Secondary | ICD-10-CM | POA: Insufficient documentation

## 2019-03-19 DIAGNOSIS — I2510 Atherosclerotic heart disease of native coronary artery without angina pectoris: Secondary | ICD-10-CM | POA: Insufficient documentation

## 2019-03-19 DIAGNOSIS — E785 Hyperlipidemia, unspecified: Secondary | ICD-10-CM | POA: Insufficient documentation

## 2019-03-19 DIAGNOSIS — Z7982 Long term (current) use of aspirin: Secondary | ICD-10-CM | POA: Insufficient documentation

## 2019-03-19 DIAGNOSIS — D120 Benign neoplasm of cecum: Secondary | ICD-10-CM | POA: Insufficient documentation

## 2019-03-19 DIAGNOSIS — Z79899 Other long term (current) drug therapy: Secondary | ICD-10-CM | POA: Insufficient documentation

## 2019-03-19 DIAGNOSIS — Z1211 Encounter for screening for malignant neoplasm of colon: Secondary | ICD-10-CM

## 2019-03-19 DIAGNOSIS — Z7984 Long term (current) use of oral hypoglycemic drugs: Secondary | ICD-10-CM | POA: Insufficient documentation

## 2019-03-19 DIAGNOSIS — I252 Old myocardial infarction: Secondary | ICD-10-CM | POA: Insufficient documentation

## 2019-03-19 DIAGNOSIS — I10 Essential (primary) hypertension: Secondary | ICD-10-CM | POA: Insufficient documentation

## 2019-03-19 DIAGNOSIS — Z7902 Long term (current) use of antithrombotics/antiplatelets: Secondary | ICD-10-CM | POA: Insufficient documentation

## 2019-03-19 DIAGNOSIS — F419 Anxiety disorder, unspecified: Secondary | ICD-10-CM | POA: Insufficient documentation

## 2019-03-19 DIAGNOSIS — E119 Type 2 diabetes mellitus without complications: Secondary | ICD-10-CM | POA: Insufficient documentation

## 2019-03-19 DIAGNOSIS — K635 Polyp of colon: Secondary | ICD-10-CM

## 2019-03-19 DIAGNOSIS — K5730 Diverticulosis of large intestine without perforation or abscess without bleeding: Secondary | ICD-10-CM

## 2019-03-19 DIAGNOSIS — F329 Major depressive disorder, single episode, unspecified: Secondary | ICD-10-CM | POA: Insufficient documentation

## 2019-03-19 HISTORY — PX: COLONOSCOPY WITH PROPOFOL: SHX5780

## 2019-03-19 SURGERY — COLONOSCOPY WITH PROPOFOL
Anesthesia: General

## 2019-03-19 MED ORDER — PROPOFOL 10 MG/ML IV BOLUS
INTRAVENOUS | Status: DC | PRN
  Administered 2019-03-19: 30 mg via INTRAVENOUS
  Administered 2019-03-19: 50 mg via INTRAVENOUS
  Administered 2019-03-19: 20 mg via INTRAVENOUS

## 2019-03-19 MED ORDER — PROPOFOL 500 MG/50ML IV EMUL
INTRAVENOUS | Status: AC
  Filled 2019-03-19: qty 50

## 2019-03-19 MED ORDER — PROPOFOL 500 MG/50ML IV EMUL
INTRAVENOUS | Status: DC | PRN
  Administered 2019-03-19: 100 ug/kg/min via INTRAVENOUS

## 2019-03-19 MED ORDER — LIDOCAINE HCL (PF) 2 % IJ SOLN
INTRAMUSCULAR | Status: DC | PRN
  Administered 2019-03-19: 40 mg via INTRADERMAL

## 2019-03-19 MED ORDER — SODIUM CHLORIDE 0.9 % IV SOLN
INTRAVENOUS | Status: DC
  Administered 2019-03-19: 1000 mL via INTRAVENOUS

## 2019-03-19 NOTE — H&P (Signed)
Cephas Darby, MD [address]  [place]  [place], [place] [address]  Main: [phone]  Fax: [phone] Pager: [phone]  Primary Care Physician:  Langston Reusing, NP Primary Gastroenterologist:  Dr. Cephas Darby  Pre-Procedure History & Physical: HPI:  Larry French is a 58 y.o. male is here for an colonoscopy.   Past Medical History:  Diagnosis Date  . Alcohol abuse    Recovered x 15 months  . Anxiety   . CA - skin cancer   . CAD, residual CFX LAD disease 06/01/2011   a. s/p inferior ST elevation MI s/p PCI/DES to RCA on 05/29/2011; b. residual LAD and LCx CAD tx'd on 06/02/11 cath with LAD CAD successfully tx'd w/ PCI/DES, LCx managaged medically   . Depression   . Headache(784.0)   . Hyperlipemia   . Hypertension   . Insomnia   . Mental disorder   . MI (myocardial infarction) ([place])   . Polysubstance abuse ([place])    a. etoh, xanax, tobacco  . PTSD (post-traumatic stress disorder)     Past Surgical History:  Procedure Laterality Date  . CARDIAC CATHETERIZATION Bilateral 04/09/2015   Procedure: Coronary Angiogram;  Surgeon: Wellington Hampshire, MD;  Location: [facility];  Service: Cardiovascular;  Laterality: Bilateral;  . CARDIAC CATHETERIZATION [name] 04/09/2015   Procedure: Coronary Stent Intervention;  Surgeon: Wellington Hampshire, MD;  Location: [facility];  Service: Cardiovascular;  Laterality: [name];  . LEFT HEART CATH Right 05/29/2011   Procedure: LEFT HEART CATH;  Surgeon: Leonie Man, MD;  Location[facility];  Service: Cardiovascular;  Laterality: Right;  . LEFT HEART CATHETERIZATION WITH CORONARY ANGIOGRAM [name] 06/01/2011   Procedure: LEFT HEART CATHETERIZATION WITH CORONARY ANGIOGRAM;  Surgeon: Leonie Man, MD;  Location: [facility] CATH LAB;  Service: Cardiovascular;  Laterality: [name];  relook cath, possible PCI  . TONSILLECTOMY AND ADENOIDECTOMY      Prior to Admission medications   Medication Sig Start Date End Date  Taking? Authorizing Provider  aspirin 81 MG EC tablet TAKE ONE TABLET BY MOUTH EVERY DAY 01/09/16  Yes McGowan, Larene Beach A, PA-C  blood glucose meter kit and supplies KIT Dispense based on patient and insurance preference. Use up to four times daily as directed. (FOR ICD-9 250.00, 250.01). 10/17/18  Yes Iloabachie, Chioma E, NP  clonazePAM (KLONOPIN) 1 MG tablet Take 1 mg by mouth daily. 1/2 - 2 daily   Yes [provider]  clopidogrel (PLAVIX) 75 MG tablet Take 1 tablet (75 mg total) by mouth daily. 09/26/18  Yes Iloabachie, Chioma E, NP  gabapentin (NEURONTIN) 600 MG tablet Take 600 mg by mouth 2 (two) times daily.    Yes [provider]  glipiZIDE (GLUCOTROL) 5 MG tablet Take 0.5 tablets (2.5 mg total) by mouth daily before breakfast. 11/08/18  Yes Iloabachie, Chioma E, NP  hydrOXYzine (ATARAX/VISTARIL) 50 MG tablet Take 1 tablet (50 mg total) by mouth every 6 (six) hours as needed for anxiety. 06/26/15  Yes Kerrie Buffalo, NP  losartan (COZAAR) 25 MG tablet Take 1 tablet (25 mg total) by mouth daily. 02/28/19 03/30/19 Yes Iloabachie, Chioma E, NP  metoprolol tartrate (LOPRESSOR) 25 MG tablet Take 1 tablet (25 mg total) by mouth 2 (two) times daily. 09/26/18  Yes Iloabachie, Chioma E, NP  QUEtiapine (SEROQUEL) 200 MG tablet Take 200 mg by mouth at bedtime. 1/2 - 2 tablets at bedtime as needed   Yes [provider]  rosuvastatin (CRESTOR) 20  MG tablet TAKE ONE TABLET BY MOUTH EVERY DAY 03/12/19  Yes Iloabachie, Chioma E, NP  sildenafil (VIAGRA) 50 MG tablet 1-2 tabs 1 hour prior to intercourse 12/15/18  Yes Stoioff, Ronda Fairly, MD  traZODone (DESYREL) 100 MG tablet Take 200 mg by mouth at bedtime. 1-2 tablets   Yes [provider]    Allergies as of 03/05/2019  . (No Known Allergies)    Family History  Problem Relation Age of Onset  . Cancer Mother        Non-Hodgkin lymphoma  . Depression Mother   . Anxiety disorder Mother   . Depression Father   . Anxiety  disorder Father   . Coronary artery disease Maternal Grandfather 29       Died    Social History   Socioeconomic History  . Marital status: Single    Spouse name: Not on file  . Number of children: 0  . Years of education: Not on file  . Highest education level: Not on file  Occupational History  . Occupation: Electrical engineer: [employer]  . Financial resource strain: Not on file  . Food insecurity    Worry: Not on file    Inability: Not on file  . Transportation needs    Medical: Not on file    Non-medical: Not on file  Tobacco Use  . Smoking status: Current Every Day Smoker    Packs/day: 1.00    Years: 30.00    Pack years: 30.00    Types: Cigarettes  . Smokeless tobacco: Never Used  Substance and Sexual Activity  . Alcohol use: No    Frequency: Never    Comment: Previous user   . Drug use: No  . Sexual activity: Not Currently  Lifestyle  . Physical activity    Days per week: Not on file    Minutes per session: Not on file  . Stress: Not on file  Relationships  . Social Herbalist on phone: Not on file    Gets together: Not on file    Attends religious service: Not on file    Active member of club or organization: Not on file    Attends meetings of clubs or organizations: Not on file    Relationship status: Not on file  . Intimate partner violence    Fear of current or ex partner: Not on file    Emotionally abused: Not on file    Physically abused: Not on file    Forced sexual activity: Not on file  Other Topics Concern  . Not on file  Social History Narrative  . Not on file    Review of Systems: See HPI, otherwise negative ROS  Physical Exam: BP (!) 144/86   Pulse 61   Temp 97.6 F (36.4 C) (Tympanic)   Resp 20   Ht _0  (1.753 m)   Wt 99.8 kg   SpO2 98%   BMI 32.49 kg/m  General:   Alert,  pleasant and cooperative in NAD Head:  Normocephalic and atraumatic. Neck:  Supple; no masses or thyromegaly.  Lungs:  Clear throughout to auscultation.    Heart:  Regular rate and rhythm. Abdomen:  Soft, nontender and nondistended. Normal bowel sounds, without guarding, and without rebound.   Neurologic:  Alert and  oriented x4;  grossly normal neurologically.  Impression/Plan: Larry French is here for an colonoscopy to be performed for colon cancer screening  Risks,  benefits, limitations, and alternatives regarding  colonoscopy have been reviewed with the patient.  Questions have been answered.  All parties agreeable.   Sherri Sear, MD  03/19/2019, 10:42 AM

## 2019-03-19 NOTE — Transfer of Care (Signed)
Immediate Anesthesia Transfer of Care Note  Patient: Larry French  Procedure(s) Performed: COLONOSCOPY WITH PROPOFOL ([name] )  Patient Location: PACU  Anesthesia Type:General  Level of Consciousness: awake, oriented and drowsy  Airway & Oxygen Therapy: Patient Spontanous Breathing  Post-op Assessment: Report given to RN and Post -op Vital signs reviewed and stable  Post vital signs: Reviewed and stable  Last Vitals:  Vitals Value Taken Time  BP    Temp    Pulse    Resp    SpO2      Last Pain:  Vitals:   03/19/19 1155  TempSrc: (P) Temporal  PainSc:          Complications: No apparent anesthesia complications

## 2019-03-19 NOTE — Anesthesia Postprocedure Evaluation (Signed)
Anesthesia Post Note  Patient: Larry French  Procedure(s) Performed: COLONOSCOPY WITH PROPOFOL ([name] )  Patient location during evaluation: PACU Anesthesia Type: General Level of consciousness: awake and alert Pain management: pain level controlled Vital Signs Assessment: post-procedure vital signs reviewed and stable Respiratory status: spontaneous breathing, nonlabored ventilation and respiratory function stable Cardiovascular status: blood pressure returned to baseline and stable Postop Assessment: no apparent nausea or vomiting Anesthetic complications: no     Last Vitals:  Vitals:   03/19/19 1205 03/19/19 1215  BP: 105/61 106/75  Pulse: 66 (!) 59  Resp: 16 16  Temp:    SpO2:      Last Pain:  Vitals:   03/19/19 1215  TempSrc:   PainSc: 0-No pain                 Durenda Hurt

## 2019-03-19 NOTE — Anesthesia Post-op Follow-up Note (Signed)
Anesthesia QCDR form completed.        

## 2019-03-19 NOTE — Op Note (Signed)
[facility] Gastroenterology Patient Name: Larry French Procedure Date: 03/19/2019 11:20 AM MRN: [id number] Account #: [id number] Date of Birth: [birth year] Admit Type: Outpatient Age: 58 Room: [facility] ENDO ROOM 1 Gender: Male Note Status: Finalized Procedure:             Colonoscopy Indications:           Screening for colorectal malignant neoplasm, This is                         the patient's first colonoscopy Providers:             Lin Landsman MD, MD Referring MD:          No Local Md, MD (Referring MD) Medicines:             Monitored Anesthesia Care Complications:         No immediate complications. Estimated blood loss: None. Procedure:             Pre-Anesthesia Assessment:                        - Prior to the procedure, a History and Physical was                         performed, and patient medications and allergies were                         reviewed. The patient is competent. The risks and                         benefits of the procedure and the sedation options and                         risks were discussed with the patient. All questions                         were answered and informed consent was obtained.                         Patient identification and proposed procedure were                         verified by the physician, the nurse, the                         anesthesiologist, the anesthetist and the technician                         in the pre-procedure area in the procedure room in the                         endoscopy suite. Mental Status Examination: alert and                         oriented. Airway Examination: normal oropharyngeal                         airway and neck mobility. Respiratory Examination:  clear to auscultation. CV Examination: normal.                         Prophylactic Antibiotics: The patient does not require                         prophylactic antibiotics. Prior  Anticoagulants: The                         patient has taken Plavix (clopidogrel), last dose was                         5 days prior to procedure. ASA Grade Assessment: III -                         A patient with severe systemic disease. After                         reviewing the risks and benefits, the patient was                         deemed in satisfactory condition to undergo the                         procedure. The anesthesia plan was to use monitored                         anesthesia care (MAC). Immediately prior to                         administration of medications, the patient was                         re-assessed for adequacy to receive sedatives. The                         heart rate, respiratory rate, oxygen saturations,                         blood pressure, adequacy of pulmonary ventilation, and                         response to care were monitored throughout the                         procedure. The physical status of the patient was                         re-assessed after the procedure.                        After obtaining informed consent, the colonoscope was                         passed under direct vision. Throughout the procedure,                         the patient's blood pressure, pulse, and oxygen  saturations were monitored continuously. The                         Colonoscope was introduced through the anus and                         advanced to the the cecum, identified by appendiceal                         orifice and ileocecal valve. The colonoscopy was                         performed without difficulty. The patient tolerated                         the procedure well. The quality of the bowel                         preparation was evaluated using the BBPS [facility] Bowel                         Preparation Scale) with scores of: Right Colon = 3                         (entire mucosa seen well with no residual  staining,                         small fragments of stool or opaque liquid), Transverse                         Colon = 3 (entire mucosa seen well with no residual                         staining, small fragments of stool or opaque liquid)                         and Left Colon = 2 (minor amount of residual staining,                         small fragments of stool and/or opaque liquid, but                         mucosa seen well). The total BBPS score equals 8. Findings:      The perianal and digital rectal examinations were normal. Pertinent       negatives include normal sphincter tone and no palpable rectal lesions.      A diminutive polyp was found in the cecum. The polyp was sessile. The       polyp was removed with a cold biopsy forceps. Resection and retrieval       were complete.      Two sessile polyps were found in the descending colon. The polyps were 5       mm in size. These polyps were removed with a cold snare. Resection and       retrieval were complete.      Multiple diverticula were found in the sigmoid colon.      The retroflexed view of the distal  rectum and anal verge was normal and       showed no anal or rectal abnormalities. Impression:            - One diminutive polyp in the cecum, removed with a                         cold biopsy forceps. Resected and retrieved.                        - Two 5 mm polyps in the descending colon, removed                         with a cold snare. Resected and retrieved.                        - Diverticulosis in the sigmoid colon.                        - The distal rectum and anal verge are normal on                         retroflexion view. Recommendation:        - Discharge patient to home (with escort).                        - Resume previous diet today.                        - Continue present medications.                        - Resume Plavix (clopidogrel) at prior dose today.                         Refer to  managing physician for further adjustment of                         therapy.                        - Await pathology results.                        - Repeat colonoscopy in 7 years for surveillance based                         on pathology results. Procedure Code(s):     --- Professional ---                        [phone], Colonoscopy, flexible; with removal of                         tumor(s), polyp(s), or other lesion(s) by snare                         technique                        82993, 59, Colonoscopy, flexible; with biopsy, single  or multiple Diagnosis Code(s):     --- Professional ---                        K63.5, Polyp of colon                        Z12.11, Encounter for screening for malignant neoplasm                         of colon                        K57.30, Diverticulosis of large intestine without                         perforation or abscess without bleeding CPT copyright 2019 American Medical Association. All rights reserved. The codes documented in this report are preliminary and upon coder review may  be revised to meet current compliance requirements. Dr. Ulyess Mort Lin Landsman MD, MD 03/19/2019 11:56:22 AM This report has been signed electronically. Number of Addenda: 0 Note Initiated On: 03/19/2019 11:20 AM Scope Withdrawal Time: 0 hours 12 minutes 44 seconds  Total Procedure Duration: 0 hours 17 minutes 15 seconds  Estimated Blood Loss:  Estimated blood loss: none.      [facility]

## 2019-03-19 NOTE — Anesthesia Preprocedure Evaluation (Addendum)
Anesthesia Evaluation  Patient identified by MRN, date of birth, ID band Patient awake    Reviewed: Allergy & Precautions, H&P , NPO status , Patient's Chart, lab work & pertinent test results  Airway Mallampati: III  TM Distance: >3 FB Neck ROM: full   Comment: Large neck Dental  (+) Partial Upper, Lower Dentures   Pulmonary neg COPD, neg recent URI, Current SmokerPatient did not abstain from smoking.,           Cardiovascular hypertension, (-) angina+ CAD, + Past MI (2013) and + Cardiac Stents (2013)  (-) dysrhythmias   Lifelong dual antiplatelet therapy  Last echo from 04/07/15: - Left ventricle: The cavity size was normal. There was moderate   concentric hypertrophy. Systolic function was normal. The   estimated ejection fraction was in the range of 55% to 60%. Wall   motion was normal; there were no regional wall motion   abnormalities. Left ventricular diastolic function parameters   were normal. - Left atrium: The atrium was mildly dilated.   Neuro/Psych  Headaches, PSYCHIATRIC DISORDERS Anxiety Depression    GI/Hepatic negative GI ROS, (+)     substance abuse (h/o alcohol, BZD use, states he is "clean and sober for 5 years")  alcohol use,   Endo/Other  diabetes  Renal/GU negative Renal ROS  negative genitourinary   Musculoskeletal   Abdominal   Peds  Hematology negative hematology ROS (+)   Anesthesia Other Findings Past Medical History: No date: Alcohol abuse     Comment:  Recovered x 15 months No date: Anxiety No date: CA - skin cancer 06/01/2011: CAD, residual CFX LAD disease     Comment:  a. s/p inferior ST elevation MI s/p PCI/DES to RCA on               05/29/2011; b. residual LAD and LCx CAD tx'd on 06/02/11               cath with LAD CAD successfully tx'd w/ PCI/DES, LCx               managaged medically  No date: Depression No date: Headache(784.0) No date: Hyperlipemia No date:  Hypertension No date: Insomnia No date: Mental disorder No date: MI (myocardial infarction) ([place]) No date: Polysubstance abuse ([place])     Comment:  a. etoh, xanax, tobacco No date: PTSD (post-traumatic stress disorder)  Past Surgical History: 04/09/2015: CARDIAC CATHETERIZATION; Bilateral     Comment:  Procedure: Coronary Angiogram;  Surgeon: Wellington Hampshire, MD;  Location: [place] [facility];  Service:               Cardiovascular;  Laterality: Bilateral; 04/09/2015: CARDIAC CATHETERIZATION; [name]     Comment:  Procedure: Coronary Stent Intervention;  Surgeon:               Wellington Hampshire, MD;  Location: [place] [facility];                Service: Cardiovascular;  Laterality: [name]; 05/29/2011: LEFT HEART CATH; Right     Comment:  Procedure: LEFT HEART CATH;  Surgeon: Leonie Man,               MD;  Location: [facility] CATH LAB;  Service: Cardiovascular;                Laterality: Right; 06/01/2011: LEFT HEART CATHETERIZATION WITH CORONARY ANGIOGRAM; [name]  Comment:  Procedure: LEFT HEART CATHETERIZATION WITH CORONARY               ANGIOGRAM;  Surgeon: Leonie Man, MD;  Location: [facility]               CATH LAB;  Service: Cardiovascular;  Laterality: [name];                relook cath, possible PCI No date: TONSILLECTOMY AND ADENOIDECTOMY     Reproductive/Obstetrics negative OB ROS                           Anesthesia Physical Anesthesia Plan  ASA: III  Anesthesia Plan: General   Post-op Pain Management:    Induction:   PONV Risk Score and Plan: Propofol infusion and TIVA  Airway Management Planned: Natural Airway and Nasal Cannula  Additional Equipment:   Intra-op Plan:   Post-operative Plan:   Informed Consent: I have reviewed the patients History and Physical, chart, labs and discussed the procedure including the risks, benefits and alternatives for the proposed anesthesia with the patient or authorized representative who has  indicated his/her understanding and acceptance.     Dental Advisory Given  Plan Discussed with: Anesthesiologist  Anesthesia Plan Comments:         Anesthesia Quick Evaluation

## 2019-03-20 ENCOUNTER — Encounter: Payer: Self-pay | Admitting: Gastroenterology

## 2019-03-20 LAB — SURGICAL PATHOLOGY

## 2019-04-16 ENCOUNTER — Other Ambulatory Visit: Payer: Medicaid Other

## 2019-04-24 ENCOUNTER — Other Ambulatory Visit: Payer: Self-pay

## 2019-04-24 ENCOUNTER — Ambulatory Visit: Payer: Medicaid Other

## 2019-04-24 DIAGNOSIS — Z79899 Other long term (current) drug therapy: Secondary | ICD-10-CM

## 2019-04-24 NOTE — Progress Notes (Signed)
Medication Management Clinic Visit Note  Patient: Larry French MRN: [id number] Date of Birth: Apr 27, 1960 PCP: Langston Reusing, NP   [name] 59 y.o. male presents for a telephone medication therapy manangement visit with the pharmacist today. Patient identified using two patient identifiers.  There were no vitals taken for this visit.  Patient Information   Past Medical History:  Diagnosis Date  . Alcohol abuse    Recovered x 15 months  . Anxiety   . CA - skin cancer   . CAD, residual CFX LAD disease 06/01/2011   a. s/p inferior ST elevation MI s/p PCI/DES to RCA on 05/29/2011; b. residual LAD and LCx CAD tx'd on 06/02/11 cath with LAD CAD successfully tx'd w/ PCI/DES, LCx managaged medically   . Depression   . Headache(784.0)   . Hyperlipemia   . Hypertension   . Insomnia   . Mental disorder   . MI (myocardial infarction) ([place])   . Polysubstance abuse ([place])    a. etoh, xanax, tobacco  . PTSD (post-traumatic stress disorder)       Past Surgical History:  Procedure Laterality Date  . CARDIAC CATHETERIZATION Bilateral 04/09/2015   Procedure: Coronary Angiogram;  Surgeon: Wellington Hampshire, MD;  Location: [place] [facility];  Service: Cardiovascular;  Laterality: Bilateral;  . CARDIAC CATHETERIZATION [name] 04/09/2015   Procedure: Coronary Stent Intervention;  Surgeon: Wellington Hampshire, MD;  Location: [place] [facility];  Service: Cardiovascular;  Laterality: [name];  . COLONOSCOPY WITH PROPOFOL [name] 03/19/2019   Procedure: COLONOSCOPY WITH PROPOFOL;  Surgeon: Lin Landsman, MD;  Location: [facility] ENDOSCOPY;  Service: Gastroenterology;  Laterality: [name];  . LEFT HEART CATH Right 05/29/2011   Procedure: LEFT HEART CATH;  Surgeon: Leonie Man, MD;  Location: [facility] CATH LAB;  Service: Cardiovascular;  Laterality: Right;  . LEFT HEART CATHETERIZATION WITH CORONARY ANGIOGRAM [name] 06/01/2011   Procedure: LEFT HEART CATHETERIZATION WITH CORONARY ANGIOGRAM;  Surgeon:  Leonie Man, MD;  Location: [facility] CATH LAB;  Service: Cardiovascular;  Laterality: [name];  relook cath, possible PCI  . TONSILLECTOMY AND ADENOIDECTOMY       Family History  Problem Relation Age of Onset  . Cancer Mother        Non-Hodgkin lymphoma  . Depression Mother   . Anxiety disorder Mother   . Depression Father   . Anxiety disorder Father   . Coronary artery disease Maternal Grandfather 76       Died    New Diagnoses (since last visit): No  Family Support: Good. 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 He is going to start exercise next week. Plan is to exercise 3x/week with cardiovascular exercise on treadmill and upper body stretching. Goal to lose 20 lbs by March 1st   Social History   Substance and Sexual Activity  Alcohol Use No   Comment: Previous user    Denies alcohol use   Social History   Tobacco Use  Smoking Status Current Every Day Smoker  . Packs/day: 1.00  . Years: 30.00  . Pack years: 30.00  . Types: Cigarettes  Smokeless Tobacco Never Used   Smoking a little less than 1 PPD. Never quit smoking in the past. Not  interested in quitting smoking at this time.    Health Maintenance  Topic Date Due  . Hepatitis C Screening  Nov 29, 1960  . HIV Screening  01/13/1977  . TETANUS/TDAP  01/13/1981  . OPHTHALMOLOGY EXAM  11/25/2017  . HEMOGLOBIN A1C  08/21/2019  . URINE MICROALBUMIN  02/21/2020  . FOOT EXAM  02/28/2020  . COLONOSCOPY  03/18/2024  . INFLUENZA VACCINE  Completed  . PNEUMOCOCCAL POLYSACCHARIDE VACCINE AGE 48-64 HIGH RISK  Completed   Outpatient Encounter Medications as of 04/24/2019  Medication Sig  . aspirin 81 MG EC tablet TAKE ONE TABLET BY MOUTH EVERY DAY  . blood  glucose meter kit and supplies KIT Dispense based on patient and insurance preference. Use up to four times daily as directed. (FOR ICD-9 250.00, 250.01).  . clonazePAM (KLONOPIN) 1 MG tablet Take 1 mg by mouth daily. 1/2 - 2 daily  . clopidogrel (PLAVIX) 75 MG tablet Take 1 tablet (75 mg total) by mouth daily.  Marland Kitchen gabapentin (NEURONTIN) 600 MG tablet Take 600 mg by mouth 3 (three) times daily.   Marland Kitchen glipiZIDE (GLUCOTROL) 5 MG tablet Take 0.5 tablets (2.5 mg total) by mouth daily before breakfast.  . hydrOXYzine (ATARAX/VISTARIL) 50 MG tablet Take 1 tablet (50 mg total) by mouth every 6 (six) hours as needed for anxiety.  Marland Kitchen QUEtiapine (SEROQUEL) 200 MG tablet Take 200 mg by mouth at bedtime. 1/2 - 2 tablets at bedtime as needed  . rosuvastatin (CRESTOR) 20 MG tablet TAKE ONE TABLET BY MOUTH EVERY DAY  . traZODone (DESYREL) 100 MG tablet Take 100 mg by mouth at bedtime. 1-2 tablets  . losartan (COZAAR) 25 MG tablet Take 1 tablet (25 mg total) by mouth daily.  . metoprolol tartrate (LOPRESSOR) 25 MG tablet Take 1 tablet (25 mg total) by mouth 2 (two) times daily.  . sildenafil (VIAGRA) 50 MG tablet 1-2 tabs 1 hour prior to intercourse (Patient not taking: Reported on 04/24/2019)   No facility-administered encounter medications on file as of 04/24/2019.    Health Maintenance/Date Completed  Last ED visit: No recent ED visits Last Visit to PCP: 02/28/2019 Next Visit to PCP: 08/14/2019 Specialist Visit: Otolaryngology and audiology at [facility] for hearing loss Dental Exam: Yes. Reports last was 6 months ago. Eye Exam: Wears glasses. Eye exams through [facility]. Prostate Exam: Reports he is in process of obtaining one through [facility].Last PSA 0.9 on 11/04/2016 Colonoscopy: 03/19/2019 Flu Vaccine: Received annual flu vaccine at [employer] where he works Pneumonia Vaccine: 02/28/2019  Assessment and Plan:   1. Essential hypertension -Goal is less than 140/90 -Losartan 25 mg daily in  addition to metoprolol 25 mg twice daily for CAD -He is out of losartan and metoprolol and has not been taking these medications. Requesting refills.  -Low salt DASH diet -Exercise regularly as tolerated -He has access to blood pressure monitoring device at home but is not checking regularly. He reports last blood pressure measurement was 140/70.  2. Diabetes mellitus, controlled -Last Hgb A1C was 6.2 on 02/21/2019 which had decreased from 6.5 on 09/21/2018 -Antihyperglycemics include glipizide 2.5 mg daily before breakfast. Denies trial of metformin in the past.  -He has a glucose monitor at home but is not regularly checking blood sugar. He endorses symptoms of hypoglycemia including shakiness which happens infrequently. Advised him to check blood sugar when he has symptoms of low blood sugar and keep a log to track frequency. -Reports he has started on a ketogenic diet and is starting  an exercise program next week which will include aerobic exercise and upper-body stretching -On a high intensity statin, rosuvastatin 20 mg for secondary prevention -On an ARB for hypertension. Last microalbumin <4 on 02/21/2019  3. Coronary Artery Disease -History of MI s/p stenting on DAPT with clopidogrel 75 mg daily + aspirin 81 mg daily  -Metoprolol 25 mg BID + losartan 25 mg daily -High intensity statin with rosuvastatin 20 mg daily  4. Depression/anxiety -Clonazepam 1 mg daily, hydroxyzine 50 mg q6h PRN anxiety, quetiapine 200 mg qhs, trazodone 100 mg qhs  5. Erectile dysfunction -Sildenafil 50 mg 1-2 tabs prior to intercourse   6. Adherence -Patient endorses taking medications daily as prescribed -He is out of metoprolol, losartan, and sildenafil and is requesting refills on these medications  [place] Resident 24 April 2019

## 2019-07-17 ENCOUNTER — Other Ambulatory Visit: Payer: Self-pay | Admitting: Gerontology

## 2019-07-17 ENCOUNTER — Other Ambulatory Visit: Payer: Self-pay | Admitting: Adult Health Nurse Practitioner

## 2019-07-17 DIAGNOSIS — I10 Essential (primary) hypertension: Secondary | ICD-10-CM

## 2019-07-17 DIAGNOSIS — E785 Hyperlipidemia, unspecified: Secondary | ICD-10-CM

## 2019-08-08 ENCOUNTER — Other Ambulatory Visit: Payer: Medicaid Other

## 2019-08-08 ENCOUNTER — Other Ambulatory Visit: Payer: Self-pay

## 2019-08-08 DIAGNOSIS — E119 Type 2 diabetes mellitus without complications: Secondary | ICD-10-CM

## 2019-08-09 ENCOUNTER — Telehealth: Payer: Self-pay | Admitting: Pharmacy Technician

## 2019-08-09 LAB — HEMOGLOBIN A1C
Est. average glucose Bld gHb Est-mCnc: 137 mg/dL
Hgb A1c MFr Bld: 6.4 % — ABNORMAL HIGH (ref 4.8–5.6)

## 2019-08-09 NOTE — Telephone Encounter (Signed)
Patient has provided some information.  Still needs to provide last 30 days of paystubs and bank statement.  No refills will be provided until receiving the requested financial information.  [facility]

## 2019-08-10 ENCOUNTER — Other Ambulatory Visit: Payer: Self-pay | Admitting: Gerontology

## 2019-08-10 DIAGNOSIS — E119 Type 2 diabetes mellitus without complications: Secondary | ICD-10-CM

## 2019-08-10 DIAGNOSIS — I10 Essential (primary) hypertension: Secondary | ICD-10-CM

## 2019-08-14 ENCOUNTER — Encounter: Payer: Self-pay | Admitting: Gerontology

## 2019-08-14 ENCOUNTER — Ambulatory Visit: Payer: Medicaid Other | Admitting: Gerontology

## 2019-08-14 ENCOUNTER — Other Ambulatory Visit: Payer: Self-pay

## 2019-08-14 VITALS — BP 160/90 | HR 62 | Temp 97.7°F | Ht 69.0 in | Wt 224.0 lb

## 2019-08-14 DIAGNOSIS — I10 Essential (primary) hypertension: Secondary | ICD-10-CM

## 2019-08-14 DIAGNOSIS — R200 Anesthesia of skin: Secondary | ICD-10-CM | POA: Insufficient documentation

## 2019-08-14 DIAGNOSIS — R202 Paresthesia of skin: Secondary | ICD-10-CM

## 2019-08-14 DIAGNOSIS — F419 Anxiety disorder, unspecified: Secondary | ICD-10-CM | POA: Insufficient documentation

## 2019-08-14 DIAGNOSIS — E785 Hyperlipidemia, unspecified: Secondary | ICD-10-CM

## 2019-08-14 DIAGNOSIS — E119 Type 2 diabetes mellitus without complications: Secondary | ICD-10-CM

## 2019-08-14 MED ORDER — ROSUVASTATIN CALCIUM 20 MG PO TABS: 20.0000 mg | ORAL_TABLET | Freq: Every day | ORAL | 2 refills | Status: DC

## 2019-08-14 MED ORDER — GLIPIZIDE 5 MG PO TABS: 2.5000 mg | ORAL_TABLET | Freq: Every day | ORAL | 2 refills | Status: DC

## 2019-08-14 MED ORDER — LOSARTAN POTASSIUM 50 MG PO TABS: 50.0000 mg | ORAL_TABLET | Freq: Every day | ORAL | 3 refills | Status: DC

## 2019-08-14 NOTE — Progress Notes (Signed)
Established Patient Office Visit  Subjective:  Patient ID: Larry French, male    DOB: 1960-09-24  Age: 59 y.o. MRN: [id number]  CC:  Chief Complaint  Patient presents with  . Diabetes    HPI Larry French presents for follow up of type 2 diabetes, hypertension, lab review and medication refill. His HgbA1c done on 08/08/2019 increased from 6.2% to 6.4%. and he continuesto take2.5 mg glipizidedaily. He states that he does not check his blood glucose,  denies hypo/hyperglycemic symptoms. His blood glucose was checked during visit and it was 113 mg/dl, he states that he continues to work on his diet and exercises as tolerated. Currently, he c/o numbness and tingling to left ring and 5th finger that radiates to elbow which started 2 weeks ago. He denies trauma to hand or fingers. He denies motor weakness. He reports that performing hand exercises moderately relieves symptoms and he takes 600 mg Gabapentin bid which was prescribed by his Psychiatrist Dr Pauline Aus. His blood pressure was elevated during visit, he states that he checks his blood pressure at home and it's usually between 140-150/90's. He continues to smoke 1 pack of cigarette daily and states that he's not ready to quit. Currently, he states that his anxiety is worsening, he's overwhelmed with taking care of his aging parents, and unable to focus. He will start seeing a new Psychiatrist on 09/22/2019 and states that he needs medication during the day until he sees the new Provider. He states that taking Vistaril during the day offered no relief. He states that his mood is good, and denies suicidal nor homicidal ideation. Overall, he states that he's doing well and offers no further complaint.  Past Medical History:  Diagnosis Date  . Alcohol abuse    Recovered x 15 months  . Anxiety   . CA - skin cancer   . CAD, residual CFX LAD disease 06/01/2011   a. s/p inferior ST elevation MI s/p PCI/DES to RCA on 05/29/2011; b. residual  LAD and LCx CAD tx'd on 06/02/11 cath with LAD CAD successfully tx'd w/ PCI/DES, LCx managaged medically   . Depression   . Headache(784.0)   . Hyperlipemia   . Hypertension   . Insomnia   . Mental disorder   . MI (myocardial infarction) ([place])   . Polysubstance abuse ([place])    a. etoh, xanax, tobacco  . PTSD (post-traumatic stress disorder)     Past Surgical History:  Procedure Laterality Date  . CARDIAC CATHETERIZATION Bilateral 04/09/2015   Procedure: Coronary Angiogram;  Surgeon: Wellington Hampshire, MD;  Location: [facility];  Service: Cardiovascular;  Laterality: Bilateral;  . CARDIAC CATHETERIZATION [name] 04/09/2015   Procedure: Coronary Stent Intervention;  Surgeon: Wellington Hampshire, MD;  Location: [facility];  Service: Cardiovascular;  Laterality: [name];  . COLONOSCOPY WITH PROPOFOL [name] 03/19/2019   Procedure: COLONOSCOPY WITH PROPOFOL;  Surgeon: Lin Landsman, MD;  Location: [facility];  Service: Gastroenterology;  Laterality: [name];  . LEFT HEART CATH Right 05/29/2011   Procedure: LEFT HEART CATH;  Surgeon: Leonie Man, MD;  Location: [facility] CATH LAB;  Service: Cardiovascular;  Laterality: Right;  . LEFT HEART CATHETERIZATION WITH CORONARY ANGIOGRAM [name] 06/01/2011   Procedure: LEFT HEART CATHETERIZATION WITH CORONARY ANGIOGRAM;  Surgeon: Leonie Man, MD;  Location: [facility] CATH LAB;  Service: Cardiovascular;  Laterality: [name];  relook cath, possible PCI  . TONSILLECTOMY AND ADENOIDECTOMY      Family History  Problem Relation Age of  Onset  . Cancer Mother        Non-Hodgkin lymphoma  . Depression Mother   . Anxiety disorder Mother   . Depression Father   . Anxiety disorder Father   . Coronary artery disease Maternal Grandfather 72       Died    Social History   Socioeconomic History  . Marital status: Single    Spouse name: Not on file  . Number of children: 0  . Years of education: Not on file  . Highest education level: Not on file   Occupational History  . Occupation: Electrical engineer: [employer]  Tobacco Use  . Smoking status: Current Every Day Smoker    Packs/day: 1.00    Years: 30.00    Pack years: 30.00    Types: Cigarettes  . Smokeless tobacco: Never Used  Substance and Sexual Activity  . Alcohol use: No    Comment: Previous user   . Drug use: No  . Sexual activity: Not Currently  Other Topics Concern  . Not on file  Social History Narrative  . Not on file   Social Determinants of Health   Financial Resource Strain:   . Difficulty of Paying Living Expenses:   Food Insecurity:   . Worried About Charity fundraiser in the Last Year:   . Arboriculturist in the Last Year:   Transportation Needs:   . Film/video editor (Medical):   Marland Kitchen Lack of Transportation (Non-Medical):   Physical Activity:   . Days of Exercise per Week:   . Minutes of Exercise per Session:   Stress:   . Feeling of Stress :   Social Connections:   . Frequency of Communication with Friends and Family:   . Frequency of Social Gatherings with Friends and Family:   . Attends Religious Services:   . Active Member of Clubs or Organizations:   . Attends Archivist Meetings:   Marland Kitchen Marital Status:   Intimate Partner Violence:   . Fear of Current or Ex-Partner:   . Emotionally Abused:   Marland Kitchen Physically Abused:   . Sexually Abused:     Outpatient Medications Prior to Visit  Medication Sig Dispense Refill  . aspirin 81 MG EC tablet TAKE ONE TABLET BY MOUTH EVERY DAY 90 tablet 0  . clonazePAM (KLONOPIN) 1 MG tablet Take 1 mg by mouth daily. 1/2 - 2 daily    . clopidogrel (PLAVIX) 75 MG tablet TAKE ONE TABLET BY MOUTH EVERY DAY 90 tablet 0  . gabapentin (NEURONTIN) 600 MG tablet Take 600 mg by mouth 3 (three) times daily.     . QUEtiapine (SEROQUEL) 200 MG tablet Take 200 mg by mouth at bedtime. 1/2 - 2 tablets at bedtime as needed    . traZODone (DESYREL) 100 MG tablet Take 100 mg by mouth at bedtime. 1-2  tablets    . glipiZIDE (GLUCOTROL) 5 MG tablet Take 0.5 tablets (2.5 mg total) by mouth daily before breakfast. 30 tablet 2  . hydrOXYzine (ATARAX/VISTARIL) 50 MG tablet Take 1 tablet (50 mg total) by mouth every 6 (six) hours as needed for anxiety. 30 tablet 0  . losartan (COZAAR) 25 MG tablet Take 1 tablet (25 mg total) by mouth daily. 30 tablet 2  . metoprolol tartrate (LOPRESSOR) 25 MG tablet Take 1 tablet (25 mg total) by mouth 2 (two) times daily. 60 tablet 3  . rosuvastatin (CRESTOR) 20 MG tablet TAKE ONE TABLET BY MOUTH  EVERY DAY 30 tablet 0  . blood glucose meter kit and supplies KIT Dispense based on patient and insurance preference. Use up to four times daily as directed. (FOR ICD-9 250.00, 250.01). 1 each 0  . sildenafil (VIAGRA) 50 MG tablet 1-2 tabs 1 hour prior to intercourse (Patient not taking: Reported on 04/24/2019) 10 tablet 0   No facility-administered medications prior to visit.    No Known Allergies  ROS Review of Systems  Eyes: Negative.   Respiratory: Negative.   Cardiovascular: Negative.   Endocrine: Negative.   Neurological: Negative.   Psychiatric/Behavioral: The patient is nervous/anxious.       Objective:    Physical Exam  Constitutional: He is oriented to person, place, and time. He appears well-developed.  HENT:  Head: Normocephalic and atraumatic.  Eyes: Pupils are equal, round, and reactive to light. EOM are normal.  Cardiovascular: Normal rate and regular rhythm.  Pulmonary/Chest: Effort normal and breath sounds normal.  Neurological: He is alert and oriented to person, place, and time.  Psychiatric: He has a normal mood and affect. His behavior is normal. Judgment and thought content normal.    BP (!) 160/90 (BP Location: Left Arm, Patient Position: Sitting, Cuff Size: Large)   Pulse 62   Temp 97.7 F (36.5 C)   Ht _0  (1.753 m)   Wt 224 lb (101.6 kg)   SpO2 99%   BMI 33.08 kg/m  Wt Readings from Last 3 Encounters:  08/14/19 224 lb  (101.6 kg)  08/08/19 225 lb 14.4 oz (102.5 kg)  03/19/19 220 lb (99.8 kg)   He was advised to continue on a weight loss regimen.  Health Maintenance Due  Topic Date Due  . Hepatitis C Screening  Never done  . HIV Screening  Never done  . TETANUS/TDAP  Never done  . COVID-19 Vaccine (2 - Moderna 2-dose series) 08/16/2019    There are no preventive care reminders to display for this patient.  Lab Results  Component Value Date   TSH 2.790 03/02/2018   Lab Results  Component Value Date   WBC 10.3 03/02/2018   HGB 17.7 03/02/2018   HCT 50.2 03/02/2018   MCV 86 03/02/2018   PLT 254 03/02/2018   Lab Results  Component Value Date   NA 142 02/21/2019   K 4.0 02/21/2019   CO2 23 02/21/2019   GLUCOSE 144 (H) 02/21/2019   BUN 12 02/21/2019   CREATININE 1.03 02/21/2019   BILITOT 0.2 02/21/2019   ALKPHOS 88 02/21/2019   AST 23 02/21/2019   ALT 32 02/21/2019   PROT 6.4 02/21/2019   ALBUMIN 4.5 02/21/2019   CALCIUM 9.3 02/21/2019   ANIONGAP 14 06/18/2015   Lab Results  Component Value Date   CHOL 134 02/21/2019   Lab Results  Component Value Date   HDL 35 (L) 02/21/2019   Lab Results  Component Value Date   LDLCALC 73 02/21/2019   Lab Results  Component Value Date   TRIG 149 02/21/2019   Lab Results  Component Value Date   CHOLHDL 3.8 02/21/2019   Lab Results  Component Value Date   HGBA1C 6.4 (H) 08/08/2019      Assessment & Plan:    1. Hyperlipidemia, unspecified hyperlipidemia type - He will continue on current treatment regimen,was advised to continue on low fat/low cholesterol diet and exercise as tolerated. - rosuvastatin (CRESTOR) 20 MG tablet; Take 1 tablet (20 mg total) by mouth daily.  Dispense: 30 tablet; Refill: 2  2.  Diabetes mellitus without complication ([place]) - His HgbA1c was 6.4%, he will continue on 2.5 mg Glipizide daily, he was advised to continue on low carb/ non concentrated sweet diet and exercise as tolerated. - HgB A1c;  Future  3. Essential hypertension - His blood pressure is not controlled, his Losartan was increased to 50 mg daily, he was advised to check blood pressure 3 times a week, record and bring log to follow up appointment. He was advised to continue on DASH diet, exercise as tolerated and smoking cessation. - losartan (COZAAR) 50 MG tablet; Take 1 tablet (50 mg total) by mouth daily.  Dispense: 30 tablet; Refill: 3  4. Anxiety - He was advised to call Crisis help line with worsening anxiety and will follow up with Ms. Simpson for mental health evaluation.  5. Numbness and tingling - He was advised to continue on gabapentin and monitor symptoms. He was advised to notify clinic with worsening symptoms. Will re evaluate in 3 months and possible Neurology referral.    Follow-up: Return in about 3 months (around 11/13/2019), or if symptoms worsen or fail to improve.    [facility] Jerold Coombe, NP

## 2019-08-14 NOTE — Patient Instructions (Signed)
DASH Eating Plan DASH stands for "Dietary Approaches to Stop Hypertension." The DASH eating plan is a healthy eating plan that has been shown to reduce high blood pressure (hypertension). It may also reduce your risk for type 2 diabetes, heart disease, and stroke. The DASH eating plan may also help with weight loss. What are tips for following this plan?  General guidelines  Avoid eating more than 2,300 mg (milligrams) of salt (sodium) a day. If you have hypertension, you may need to reduce your sodium intake to 1,500 mg a day.  Limit alcohol intake to no more than 1 drink a day for nonpregnant women and 2 drinks a day for men. One drink equals 12 oz of beer, 5 oz of wine, or 1 oz of hard liquor.  Work with your health care provider to maintain a healthy body weight or to lose weight. Ask what an ideal weight is for you.  Get at least 30 minutes of exercise that causes your heart to beat faster (aerobic exercise) most days of the week. Activities may include walking, swimming, or biking.  Work with your health care provider or diet and nutrition specialist (dietitian) to adjust your eating plan to your individual calorie needs. Reading food labels   Check food labels for the amount of sodium per serving. Choose foods with less than 5 percent of the Daily Value of sodium. Generally, foods with less than 300 mg of sodium per serving fit into this eating plan.  To find whole grains, look for the word "whole" as the first word in the ingredient list. Shopping  Buy products labeled as "low-sodium" or "no salt added."  Buy fresh foods. Avoid canned foods and premade or frozen meals. Cooking  Avoid adding salt when cooking. Use salt-free seasonings or herbs instead of table salt or sea salt. Check with your health care provider or pharmacist before using salt substitutes.  Do not fry foods. Cook foods using healthy methods such as baking, boiling, grilling, and broiling instead.  Cook with  heart-healthy oils, such as olive, canola, soybean, or sunflower oil. Meal planning  Eat a balanced diet that includes: ? 5 or more servings of fruits and vegetables each day. At each meal, try to fill half of your plate with fruits and vegetables. ? Up to 6-8 servings of whole grains each day. ? Less than 6 oz of lean meat, poultry, or fish each day. A 3-oz serving of meat is about the same size as a deck of cards. One egg equals 1 oz. ? 2 servings of low-fat dairy each day. ? A serving of nuts, seeds, or beans 5 times each week. ? Heart-healthy fats. Healthy fats called Omega-3 fatty acids are found in foods such as flaxseeds and coldwater fish, like sardines, salmon, and mackerel.  Limit how much you eat of the following: ? Canned or prepackaged foods. ? Food that is high in trans fat, such as fried foods. ? Food that is high in saturated fat, such as fatty meat. ? Sweets, desserts, sugary drinks, and other foods with added sugar. ? Full-fat dairy products.  Do not salt foods before eating.  Try to eat at least 2 vegetarian meals each week.  Eat more home-cooked food and less restaurant, buffet, and fast food.  When eating at a restaurant, ask that your food be prepared with less salt or no salt, if possible. What foods are recommended? The items listed may not be a complete list. Talk with your dietitian about   what dietary choices are best for you. Grains Whole-grain or whole-wheat bread. Whole-grain or whole-wheat pasta. Brown rice. Oatmeal. Quinoa. Bulgur. Whole-grain and low-sodium cereals. Pita bread. Low-fat, low-sodium crackers. Whole-wheat flour tortillas. Vegetables Fresh or frozen vegetables (raw, steamed, roasted, or grilled). Low-sodium or reduced-sodium tomato and vegetable juice. Low-sodium or reduced-sodium tomato sauce and tomato paste. Low-sodium or reduced-sodium canned vegetables. Fruits All fresh, dried, or frozen fruit. Canned fruit in natural juice (without  added sugar). Meat and other protein foods Skinless chicken or turkey. Ground chicken or turkey. Pork with fat trimmed off. Fish and seafood. Egg whites. Dried beans, peas, or lentils. Unsalted nuts, nut butters, and seeds. Unsalted canned beans. Lean cuts of beef with fat trimmed off. Low-sodium, lean deli meat. Dairy Low-fat (1%) or fat-free (skim) milk. Fat-free, low-fat, or reduced-fat cheeses. Nonfat, low-sodium ricotta or cottage cheese. Low-fat or nonfat yogurt. Low-fat, low-sodium cheese. Fats and oils Soft margarine without trans fats. Vegetable oil. Low-fat, reduced-fat, or light mayonnaise and salad dressings (reduced-sodium). Canola, safflower, olive, soybean, and sunflower oils. Avocado. Seasoning and other foods Herbs. Spices. Seasoning mixes without salt. Unsalted popcorn and pretzels. Fat-free sweets. What foods are not recommended? The items listed may not be a complete list. Talk with your dietitian about what dietary choices are best for you. Grains Baked goods made with fat, such as croissants, muffins, or some breads. Dry pasta or rice meal packs. Vegetables Creamed or fried vegetables. Vegetables in a cheese sauce. Regular canned vegetables (not low-sodium or reduced-sodium). Regular canned tomato sauce and paste (not low-sodium or reduced-sodium). Regular tomato and vegetable juice (not low-sodium or reduced-sodium). Pickles. Olives. Fruits Canned fruit in a light or heavy syrup. Fried fruit. Fruit in cream or butter sauce. Meat and other protein foods Fatty cuts of meat. Ribs. Fried meat. Bacon. Sausage. Bologna and other processed lunch meats. Salami. Fatback. Hotdogs. Bratwurst. Salted nuts and seeds. Canned beans with added salt. Canned or smoked fish. Whole eggs or egg yolks. Chicken or turkey with skin. Dairy Whole or 2% milk, cream, and half-and-half. Whole or full-fat cream cheese. Whole-fat or sweetened yogurt. Full-fat cheese. Nondairy creamers. Whipped toppings.  Processed cheese and cheese spreads. Fats and oils Butter. Stick margarine. Lard. Shortening. Ghee. Bacon fat. Tropical oils, such as coconut, palm kernel, or palm oil. Seasoning and other foods Salted popcorn and pretzels. Onion salt, garlic salt, seasoned salt, table salt, and sea salt. Worcestershire sauce. Tartar sauce. Barbecue sauce. Teriyaki sauce. Soy sauce, including reduced-sodium. Steak sauce. Canned and packaged gravies. Fish sauce. Oyster sauce. Cocktail sauce. Horseradish that you find on the shelf. Ketchup. Mustard. Meat flavorings and tenderizers. Bouillon cubes. Hot sauce and Tabasco sauce. Premade or packaged marinades. Premade or packaged taco seasonings. Relishes. Regular salad dressings. Where to find more information:  National Heart, Lung, and Blood Institute: www.nhlbi.nih.gov  American Heart Association: www.heart.org Summary  The DASH eating plan is a healthy eating plan that has been shown to reduce high blood pressure (hypertension). It may also reduce your risk for type 2 diabetes, heart disease, and stroke.  With the DASH eating plan, you should limit salt (sodium) intake to 2,300 mg a day. If you have hypertension, you may need to reduce your sodium intake to 1,500 mg a day.  When on the DASH eating plan, aim to eat more fresh fruits and vegetables, whole grains, lean proteins, low-fat dairy, and heart-healthy fats.  Work with your health care provider or diet and nutrition specialist (dietitian) to adjust your eating plan to your   individual calorie needs. This information is not intended to replace advice given to you by your health care provider. Make sure you discuss any questions you have with your health care provider. Document Revised: 03/18/2017 Document Reviewed: 03/29/2016 Elsevier Patient Education  2020 Elsevier Inc. Carbohydrate Counting for Diabetes Mellitus, Adult  Carbohydrate counting is a method of keeping track of how many carbohydrates you  eat. Eating carbohydrates naturally increases the amount of sugar (glucose) in the blood. Counting how many carbohydrates you eat helps keep your blood glucose within normal limits, which helps you manage your diabetes (diabetes mellitus). It is important to know how many carbohydrates you can safely have in each meal. This is different for every person. A diet and nutrition specialist (registered dietitian) can help you make a meal plan and calculate how many carbohydrates you should have at each meal and snack. Carbohydrates are found in the following foods:  Grains, such as breads and cereals.  Dried beans and soy products.  Starchy vegetables, such as potatoes, peas, and corn.  Fruit and fruit juices.  Milk and yogurt.  Sweets and snack foods, such as cake, cookies, candy, chips, and soft drinks. How do I count carbohydrates? There are two ways to count carbohydrates in food. You can use either of the methods or a combination of both. Reading "Nutrition Facts" on packaged food The "Nutrition Facts" list is included on the labels of almost all packaged foods and beverages in the [place] It includes:  The serving size.  Information about nutrients in each serving, including the grams (g) of carbohydrate per serving. To use the "Nutrition Facts":  Decide how many servings you will have.  Multiply the number of servings by the number of carbohydrates per serving.  The resulting number is the total amount of carbohydrates that you will be having. Learning standard serving sizes of other foods When you eat carbohydrate foods that are not packaged or do not include "Nutrition Facts" on the label, you need to measure the servings in order to count the amount of carbohydrates:  Measure the foods that you will eat with a food scale or measuring cup, if needed.  Decide how many standard-size servings you will eat.  Multiply the number of servings by 15. Most carbohydrate-rich foods have  about 15 g of carbohydrates per serving. ? For example, if you eat 8 oz (170 g) of strawberries, you will have eaten 2 servings and 30 g of carbohydrates (2 servings x 15 g = 30 g).  For foods that have more than one food mixed, such as soups and casseroles, you must count the carbohydrates in each food that is included. The following list contains standard serving sizes of common carbohydrate-rich foods. Each of these servings has about 15 g of carbohydrates:   hamburger bun or  English muffin.   oz (15 mL) syrup.   oz (14 g) jelly.  1 slice of bread.  1 six-inch tortilla.  3 oz (85 g) cooked rice or pasta.  4 oz (113 g) cooked dried beans.  4 oz (113 g) starchy vegetable, such as peas, corn, or potatoes.  4 oz (113 g) hot cereal.  4 oz (113 g) mashed potatoes or  of a large baked potato.  4 oz (113 g) canned or frozen fruit.  4 oz (120 mL) fruit juice.  4-6 crackers.  6 chicken nuggets.  6 oz (170 g) unsweetened dry cereal.  6 oz (170 g) plain fat-free yogurt or yogurt sweetened with   artificial sweeteners.  8 oz (240 mL) milk.  8 oz (170 g) fresh fruit or one small piece of fruit.  24 oz (680 g) popped popcorn. Example of carbohydrate counting Sample meal  3 oz (85 g) chicken breast.  6 oz (170 g) brown rice.  4 oz (113 g) corn.  8 oz (240 mL) milk.  8 oz (170 g) strawberries with sugar-free whipped topping. Carbohydrate calculation 1. Identify the foods that contain carbohydrates: ? Rice. ? Corn. ? Milk. ? Strawberries. 2. Calculate how many servings you have of each food: ? 2 servings rice. ? 1 serving corn. ? 1 serving milk. ? 1 serving strawberries. 3. Multiply each number of servings by 15 g: ? 2 servings rice x 15 g = 30 g. ? 1 serving corn x 15 g = 15 g. ? 1 serving milk x 15 g = 15 g. ? 1 serving strawberries x 15 g = 15 g. 4. Add together all of the amounts to find the total grams of carbohydrates eaten: ? 30 g + 15 g + 15 g + 15  g = 75 g of carbohydrates total. Summary  Carbohydrate counting is a method of keeping track of how many carbohydrates you eat.  Eating carbohydrates naturally increases the amount of sugar (glucose) in the blood.  Counting how many carbohydrates you eat helps keep your blood glucose within normal limits, which helps you manage your diabetes.  A diet and nutrition specialist (registered dietitian) can help you make a meal plan and calculate how many carbohydrates you should have at each meal and snack. This information is not intended to replace advice given to you by your health care provider. Make sure you discuss any questions you have with your health care provider. Document Revised: 10/28/2016 Document Reviewed: 09/17/2015 Elsevier Patient Education  2020 Elsevier Inc.  

## 2019-08-21 ENCOUNTER — Ambulatory Visit: Payer: Medicaid Other | Admitting: Licensed Clinical Social Worker

## 2019-08-21 ENCOUNTER — Other Ambulatory Visit: Payer: Self-pay

## 2019-08-21 DIAGNOSIS — F331 Major depressive disorder, recurrent, moderate: Secondary | ICD-10-CM

## 2019-08-21 DIAGNOSIS — F411 Generalized anxiety disorder: Secondary | ICD-10-CM

## 2019-08-21 NOTE — BH Specialist Note (Signed)
Integrated Behavioral Health Comprehensive Clinical Assessment Via Phone  MRN: [id number] Name: Larry French  Type of Service: Integrated Behavioral Health-Individual Interpretor: No. Interpretor Name and Language:   PRESENTING CONCERNS: Larry French is a 59 y.o. male accompanied by himself.Larry French was referred to [facility] clinician for mental health.  Previous mental health services Have you ever been treated for a mental health problem? Yes If "Yes", when were you treated and whom did you see? Larry French has been seeing a psychiatrist, Larry French at [facility] in [place] for the last five to six years. He is currently prescribed Trazodone 100 mg at bedtime, Seroquel 200 mg tablet at bedtime, and Clonazepam 1 mg tablet once a day. He explains that he has three refills on the Clonazepam and takes it as needed. He reports that he is sort of dissatisfied with his care at [facility] and was trying to find another provider. He explains that he is in the process of gradually weaning himself off of the Seroquel because he has gained 30 lbs since he started the medication and the high dosages work but the lower the dosage, he is unable to sleep. He is prescribed gabapentin 600 mg tablet for general aches and pains of a 59 year old. He explains that the Trazodone gives him weird dreams and is planning on weaning himself off of it.  Have you ever been hospitalized for mental health treatment? Negative Have you ever been treated for any of the following? Past Psychiatric History/Hospitalization(s): Anxiety: Yes Larry French explains that he has been experiencing anxiety off and on all his life. He notes that he previously had panic attacks ten to 15 years ago but no longer has them. He reports that 4 to 5 years ago that he was prescribed Xanax originally 1 to 2 pills a day then it was increased to nearly 6 pills a day. He notes that that due to the  addictive side effects of Xanax that he was weaned off of it and started on Vistaril. He explains that the Vistaril was like taking a baby aspirin and does not do anything for him anymore. He explains that his anxiety has increased and is experiencing shaking in his left hand.  Bipolar Disorder: No Depression: Yes Larry French has been experiencing symptoms of depression for the last 6 to 9 months. He describes feeling down and depressed nearly every day, loss of interest in previously enjoyed activities, fatigue, poor appetite, difficulty concentrating, and restlessness. He denies suicidal and homicidal thoughts.  Mania: Negative Psychosis: Negative Schizophrenia: Negative Personality Disorder: Negative Hospitalization for psychiatric illness: Negative History of Electroconvulsive Shock Therapy: Negative Prior Suicide Attempts: Negative Have you ever had thoughts of harming yourself or others or attempted suicide? 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 Primary Care Physician: Langston Reusing, NP Date of last physical exam:  Allergies: No Known Allergies Current medications:  Outpatient Encounter Medications as of 08/21/2019  Medication Sig  . aspirin 81 MG EC tablet TAKE ONE TABLET BY MOUTH EVERY DAY  . blood glucose meter kit and supplies KIT Dispense based on patient and insurance preference. Use up to four times daily as directed. (FOR ICD-9 250.00, 250.01).  . clonazePAM (KLONOPIN) 1 MG tablet  Take 1 mg by mouth daily. 1/2 - 2 daily  . clopidogrel (PLAVIX) 75 MG tablet TAKE ONE TABLET BY MOUTH EVERY DAY  . gabapentin (NEURONTIN) 600 MG tablet Take 600 mg by mouth 3 (three) times  daily.   Marland Kitchen glipiZIDE (GLUCOTROL) 5 MG tablet TAKE 1/2 TABLET (2.'5mg'$ ) BY MOUTH EVERY DAY BEFORE BREAKFAST.  Marland Kitchen losartan (COZAAR) 50 MG tablet Take 1 tablet (50 mg total) by mouth daily.  . metoprolol tartrate (LOPRESSOR) 25 MG tablet TAKE ONE TABLET BY MOUTH 2 TIMES A DAY  . QUEtiapine (SEROQUEL) 200 MG tablet Take 200 mg by mouth at bedtime. 1/2 - 2 tablets at bedtime as needed  . rosuvastatin (CRESTOR) 20 MG tablet Take 1 tablet (20 mg total) by mouth daily.  . sildenafil (VIAGRA) 50 MG tablet 1-2 tabs 1 hour prior to intercourse (Patient not taking: Reported on 04/24/2019)  . traZODone (DESYREL) 100 MG tablet Take 100 mg by mouth at bedtime. 1-2 tablets   No facility-administered encounter medications on file as of 08/21/2019.   Have you ever had any serious medication reactions? No Is there any history of mental health problems or substance abuse in your family? Yes- There is a history of mental health in the family. Both his mom and dad have a history of anxiety. He reports that his father is suffering from early signs of dementia. He denies a history of substance abuse in the family.  Has anyone in your family been hospitalized for mental health treatment? No  Social/family history Who lives in your current household? Larry French reports that he lives with his mom and dad in a home that they own. He works part time at [employer].  What is your family of origin, childhood history?  Where were you born? Where did you grow up? Larry French grew up in [place].  How many different homes have you lived in?  Describe your childhood: Larry French describes his childhood as similar to [place] on the Mathis Bud show.  Do you have siblings, step/half siblings? Yes- Larry French has a younger brother who passed away five years ago in a car accident.  What are their names, relation, sex, age? See above.  Are your parents separated or divorced? No What are your social supports? Larry French has several  close friends and a great support network.   Education How many grades have you completed? Larry French has two associate's degrees in [place] and [employer]. He has a CNA 1, CNA II, and a Engineer, structural.  Did you have any problems in school? No  Employment/financial issues Larry French works part time at [employer].   Sleep Usual bedtime varies.  Sleeping arrangements: Alone.  Problems with snoring: No Obstructive sleep apnea is not a concern. Problems with nightmares: No Problems with night terrors: No Problems with sleepwalking: No  Trauma/Abuse history Have you ever experienced or been exposed to any form of abuse? No Have you ever experienced or been exposed to something traumatic? No  Substance use Do you use alcohol, nicotine or caffeine? Larry French smokes a pack in half of cigarettes per day.  How old were you when you first tasted alcohol?  Have you ever used illicit drugs or abused prescription medications? Larry French has previously been in substance abuse treatment programs in his 58's and 17's. He admitted to having at least one or more DUI's during the 76 and 90's but became defensive when further questions were asked.   Mental status General appearance/Behavior: Unable to assess due to assessment conducted  over the phone. Eye contact: Absent due to assessment conducted over the phone.  Motor behavior: Unable to assess due to assessment conducted over the phone.  Speech: Normal Level of consciousness: Alert Mood: Euthymic Affect: Appropriate Anxiety level: Moderate Thought process: Coherent Thought content: WNL Perception: Normal Judgment: Fair Insight: Present  Diagnosis No diagnosis found.  GOALS ADDRESSED: Patient will reduce symptoms of: anxiety, depression and insomnia and increase knowledge and/or ability of: coping skills and also: Increase healthy adjustment to current life circumstances              INTERVENTIONS: Interventions  utilized: Biopsychosocial assessment completed Standardized Assessments completed: GAD-7 and PHQ 9   ASSESSMENT/OUTCOME:  Larry French is a 60 year old Caucasian male who presents today for a mental health assessment via phone due to Larry French and was referred by Larry Shadow NP at [facility]. Ramin has been receiving mental health care at [facility] in [place], [place]. He has been prescribed Clonazepam 1 mg tablet prn, gabapentin 600 mg twice a day, Seroquel 200 mg at bedtime, and Trazodone 100 mg at bedtime by Dr. Gwinda Passe at [facility]. He has been dealing with anxiety and insomnia nearly all of his life. He has not previously had a sleep study, does not snore, and has not been diagnosed with sleep apnea. He discussed wanted to wean himself off of some of the psychotropic medications he is on due to weight gain, and not being effective at treating his symptoms. He has previously seen a therapist in the past but stopped attending due to feeling that he was being inauthentic. He has previously been in substance abuse treatment for alcohol in his 20's and 30's. He denies using or abusing alcohol or other drugs.   Kamarius is an existing patient at [facility]. He has a history of heart attack in 2013, hypertension, pre diabetic, alcohol abuse, skin cancer, headaches, hyperlipemia, insomnia, and polysubstance abuse. He had a cardiac catheterization in 2016, colonoscopy, tonsillectomy, and adenoidectomy. His mom is a two time survivor of cancer non hodgkin lymphoma. His father was recently diagnosed with dementia. He smokes a pack in half of cigarettes a day.   Astor lives with his mom and dad. He is primary care giver to his dad who has early onset dementia; alzheimer's. He works part time at [employer]. He has never been married and has no kids. He has a set of close friends for support. His mom and dad have a history of anxiety. There is no  history of substance abuse in his family.   PLAN: Case consultation with Dr. Octavia Heir, MD, psychiatric consultant on Tuesday May 11th @ 9 am. Recommendation that Elmyra Ricks follow up with Dr. Gwinda Passe at [facility] for his psychotropic medications. [facility] is unable to prescribe Clonazepam and if the patient truly wishes to be weaned off of this medication then he should do show under a Dr. Gertie Gowda care at [place] due to the risk of seizures.   Scheduled next visit:   [place] [facility]

## 2019-08-22 ENCOUNTER — Telehealth: Payer: Self-pay | Admitting: Pharmacy Technician

## 2019-08-22 NOTE — Telephone Encounter (Signed)
Received updated proof of income.  Patient eligible to receive medication assistance at [facility] until time for re-certification in 9359, and as long as eligibility requirements continue to be met.  [facility]

## 2019-11-07 ENCOUNTER — Other Ambulatory Visit: Payer: Medicaid Other

## 2019-11-07 ENCOUNTER — Other Ambulatory Visit: Payer: Self-pay

## 2019-11-07 DIAGNOSIS — E119 Type 2 diabetes mellitus without complications: Secondary | ICD-10-CM

## 2019-11-08 LAB — HEMOGLOBIN A1C
Est. average glucose Bld gHb Est-mCnc: 143 mg/dL
Hgb A1c MFr Bld: 6.6 % — ABNORMAL HIGH (ref 4.8–5.6)

## 2019-11-13 ENCOUNTER — Ambulatory Visit: Payer: Medicaid Other | Admitting: Gerontology

## 2019-11-15 ENCOUNTER — Encounter: Payer: Self-pay | Admitting: Gerontology

## 2019-11-15 ENCOUNTER — Other Ambulatory Visit: Payer: Self-pay | Admitting: Gerontology

## 2019-11-15 ENCOUNTER — Ambulatory Visit: Payer: Medicaid Other | Admitting: Gerontology

## 2019-11-15 VITALS — BP 132/85 | HR 64 | Ht 69.0 in | Wt 226.0 lb

## 2019-11-15 DIAGNOSIS — I10 Essential (primary) hypertension: Secondary | ICD-10-CM

## 2019-11-15 DIAGNOSIS — E785 Hyperlipidemia, unspecified: Secondary | ICD-10-CM

## 2019-11-15 DIAGNOSIS — E119 Type 2 diabetes mellitus without complications: Secondary | ICD-10-CM

## 2019-11-15 MED ORDER — ROSUVASTATIN CALCIUM 20 MG PO TABS: 20.0000 mg | ORAL_TABLET | Freq: Every day | ORAL | 2 refills | Status: DC

## 2019-11-15 MED ORDER — METOPROLOL TARTRATE 25 MG PO TABS: ORAL_TABLET | ORAL | 0 refills | Status: DC

## 2019-11-15 MED ORDER — LOSARTAN POTASSIUM 50 MG PO TABS: 50.0000 mg | ORAL_TABLET | Freq: Every day | ORAL | 3 refills | Status: DC

## 2019-11-15 MED ORDER — GLIPIZIDE 5 MG PO TABS: ORAL_TABLET | ORAL | 0 refills | Status: DC

## 2019-11-15 MED ORDER — CLOPIDOGREL BISULFATE 75 MG PO TABS: 75.0000 mg | ORAL_TABLET | Freq: Every day | ORAL | 1 refills | Status: DC

## 2019-11-15 NOTE — Patient Instructions (Signed)
Carbohydrate Counting for Diabetes Mellitus, Adult  Carbohydrate counting is a method of keeping track of how many carbohydrates you eat. Eating carbohydrates naturally increases the amount of sugar (glucose) in the blood. Counting how many carbohydrates you eat helps keep your blood glucose within normal limits, which helps you manage your diabetes (diabetes mellitus). It is important to know how many carbohydrates you can safely have in each meal. This is different for every person. A diet and nutrition specialist (registered dietitian) can help you make a meal plan and calculate how many carbohydrates you should have at each meal and snack. Carbohydrates are found in the following foods:  Grains, such as breads and cereals.  Dried beans and soy products.  Starchy vegetables, such as potatoes, peas, and corn.  Fruit and fruit juices.  Milk and yogurt.  Sweets and snack foods, such as cake, cookies, candy, chips, and soft drinks. How do I count carbohydrates? There are two ways to count carbohydrates in food. You can use either of the methods or a combination of both. Reading "Nutrition Facts" on packaged food The "Nutrition Facts" list is included on the labels of almost all packaged foods and beverages in the [place] It includes:  The serving size.  Information about nutrients in each serving, including the grams (g) of carbohydrate per serving. To use the "Nutrition Facts":  Decide how many servings you will have.  Multiply the number of servings by the number of carbohydrates per serving.  The resulting number is the total amount of carbohydrates that you will be having. Learning standard serving sizes of other foods When you eat carbohydrate foods that are not packaged or do not include "Nutrition Facts" on the label, you need to measure the servings in order to count the amount of carbohydrates:  Measure the foods that you will eat with a food scale or measuring cup, if  needed.  Decide how many standard-size servings you will eat.  Multiply the number of servings by 15. Most carbohydrate-rich foods have about 15 g of carbohydrates per serving. ? For example, if you eat 8 oz (170 g) of strawberries, you will have eaten 2 servings and 30 g of carbohydrates (2 servings x 15 g = 30 g).  For foods that have more than one food mixed, such as soups and casseroles, you must count the carbohydrates in each food that is included. The following list contains standard serving sizes of common carbohydrate-rich foods. Each of these servings has about 15 g of carbohydrates:   hamburger bun or  English muffin.   oz (15 mL) syrup.   oz (14 g) jelly.  1 slice of bread.  1 six-inch tortilla.  3 oz (85 g) cooked rice or pasta.  4 oz (113 g) cooked dried beans.  4 oz (113 g) starchy vegetable, such as peas, corn, or potatoes.  4 oz (113 g) hot cereal.  4 oz (113 g) mashed potatoes or  of a large baked potato.  4 oz (113 g) canned or frozen fruit.  4 oz (120 mL) fruit juice.  4-6 crackers.  6 chicken nuggets.  6 oz (170 g) unsweetened dry cereal.  6 oz (170 g) plain fat-free yogurt or yogurt sweetened with artificial sweeteners.  8 oz (240 mL) milk.  8 oz (170 g) fresh fruit or one small piece of fruit.  24 oz (680 g) popped popcorn. Example of carbohydrate counting Sample meal  3 oz (85 g) chicken breast.  6 oz (170 g)   brown rice.  4 oz (113 g) corn.  8 oz (240 mL) milk.  8 oz (170 g) strawberries with sugar-free whipped topping. Carbohydrate calculation 1. Identify the foods that contain carbohydrates: ? Rice. ? Corn. ? Milk. ? Strawberries. 2. Calculate how many servings you have of each food: ? 2 servings rice. ? 1 serving corn. ? 1 serving milk. ? 1 serving strawberries. 3. Multiply each number of servings by 15 g: ? 2 servings rice x 15 g = 30 g. ? 1 serving corn x 15 g = 15 g. ? 1 serving milk x 15 g = 15 g. ? 1  serving strawberries x 15 g = 15 g. 4. Add together all of the amounts to find the total grams of carbohydrates eaten: ? 30 g + 15 g + 15 g + 15 g = 75 g of carbohydrates total. Summary  Carbohydrate counting is a method of keeping track of how many carbohydrates you eat.  Eating carbohydrates naturally increases the amount of sugar (glucose) in the blood.  Counting how many carbohydrates you eat helps keep your blood glucose within normal limits, which helps you manage your diabetes.  A diet and nutrition specialist (registered dietitian) can help you make a meal plan and calculate how many carbohydrates you should have at each meal and snack. This information is not intended to replace advice given to you by your health care provider. Make sure you discuss any questions you have with your health care provider. Document Revised: 10/28/2016 Document Reviewed: 09/17/2015 Elsevier Patient Education  2020 [employer]  

## 2019-11-15 NOTE — Progress Notes (Signed)
Established Patient Office Visit  Subjective:  Patient ID: Larry French, male    DOB: Apr 25, 1960  Age: 59 y.o. MRN: [id number]  CC:  Chief Complaint  Patient presents with  . Diabetes  . Tinnitus    learning to live with it    HPI Larry French presents for follow up of Type 2 Diabetes Mellitus, hypertension and medication refill. His HgbA1c done on 11/07/2019 increased from 6.4% to 6.6%. He states that he was not compliant with low carb/non concentrated sweet diet. He doesn't check blood glucose at home and it was 104 mg/dl during visit. He denies hypoglycemic/hyperglycemic symptoms, and peripheral neuropathy. He also states that he doesn't check his blood pressure at home, but continues to adhere to DASH diet and exercises as tolerated. He was seen by Dr Tildon Husky H.V on 10/11/2019 at [facility]. He states that his mood is good, denies suicidal nor homicidal ideation. Overall, he states that he's doing well and offers no further concerns.  Past Medical History:  Diagnosis Date  . Alcohol abuse    Recovered x 15 months  . Anxiety   . CA - skin cancer   . CAD, residual CFX LAD disease 06/01/2011   a. s/p inferior ST elevation MI s/p PCI/DES to RCA on 05/29/2011; b. residual LAD and LCx CAD tx'd on 06/02/11 cath with LAD CAD successfully tx'd w/ PCI/DES, LCx managaged medically   . Depression   . Headache(784.0)   . Hyperlipemia   . Hypertension   . Insomnia   . Mental disorder   . MI (myocardial infarction) ([place])   . Polysubstance abuse ([place])    a. etoh, xanax, tobacco  . PTSD (post-traumatic stress disorder)     Past Surgical History:  Procedure Laterality Date  . CARDIAC CATHETERIZATION Bilateral 04/09/2015   Procedure: Coronary Angiogram;  Surgeon: Wellington Hampshire, MD;  Location: [place] [facility];  Service: Cardiovascular;  Laterality: Bilateral;  . CARDIAC CATHETERIZATION [name] 04/09/2015   Procedure: Coronary Stent Intervention;  Surgeon:  Wellington Hampshire, MD;  Location: [place] [facility];  Service: Cardiovascular;  Laterality: [name];  . COLONOSCOPY WITH PROPOFOL [name] 03/19/2019   Procedure: COLONOSCOPY WITH PROPOFOL;  Surgeon: Lin Landsman, MD;  Location: [facility] ENDOSCOPY;  Service: Gastroenterology;  Laterality: [name];  . LEFT HEART CATH Right 05/29/2011   Procedure: LEFT HEART CATH;  Surgeon: Leonie Man, MD;  Location: [facility] CATH LAB;  Service: Cardiovascular;  Laterality: Right;  . LEFT HEART CATHETERIZATION WITH CORONARY ANGIOGRAM [name] 06/01/2011   Procedure: LEFT HEART CATHETERIZATION WITH CORONARY ANGIOGRAM;  Surgeon: Leonie Man, MD;  Location: [facility] CATH LAB;  Service: Cardiovascular;  Laterality: [name];  relook cath, possible PCI  . TONSILLECTOMY AND ADENOIDECTOMY      Family History  Problem Relation Age of Onset  . Cancer Mother        Non-Hodgkin lymphoma  . Depression Mother   . Anxiety disorder Mother   . Depression Father   . Anxiety disorder Father   . Coronary artery disease Maternal Grandfather 33       Died    Social History   Socioeconomic History  . Marital status: Single    Spouse name: Not on file  . Number of children: 0  . Years of education: Not on file  . Highest education level: Not on file  Occupational History  . Occupation: Electrical engineer: [employer]  Tobacco Use  . Smoking status: Current  Every Day Smoker    Packs/day: 1.00    Years: 30.00    Pack years: 30.00    Types: Cigarettes  . Smokeless tobacco: Never Used  Vaping Use  . Vaping Use: Never used  Substance and Sexual Activity  . Alcohol use: No    Comment: Previous user   . Drug use: No  . Sexual activity: Not Currently  Other Topics Concern  . Not on file  Social History Narrative  . Not on file   Social Determinants of Health   Financial Resource Strain:   . Difficulty of Paying Living Expenses:   Food Insecurity:   . Worried About Charity fundraiser in the Last Year:   . Academic librarian in the Last Year:   Transportation Needs:   . Film/video editor (Medical):   Marland Kitchen Lack of Transportation (Non-Medical):   Physical Activity:   . Days of Exercise per Week:   . Minutes of Exercise per Session:   Stress:   . Feeling of Stress :   Social Connections:   . Frequency of Communication with Friends and Family:   . Frequency of Social Gatherings with Friends and Family:   . Attends Religious Services:   . Active Member of Clubs or Organizations:   . Attends Archivist Meetings:   Marland Kitchen Marital Status:   Intimate Partner Violence:   . Fear of Current or Ex-Partner:   . Emotionally Abused:   Marland Kitchen Physically Abused:   . Sexually Abused:     Outpatient Medications Prior to Visit  Medication Sig Dispense Refill  . aspirin 81 MG EC tablet TAKE ONE TABLET BY MOUTH EVERY DAY 90 tablet 0  . gabapentin (NEURONTIN) 600 MG tablet Take 600 mg by mouth 3 (three) times daily.     . QUEtiapine (SEROQUEL) 200 MG tablet Take 200 mg by mouth at bedtime. 1/2 - 2 tablets at bedtime as needed    . traZODone (DESYREL) 100 MG tablet Take 100 mg by mouth at bedtime. 1-2 tablets    . clopidogrel (PLAVIX) 75 MG tablet TAKE ONE TABLET BY MOUTH EVERY DAY 90 tablet 0  . glipiZIDE (GLUCOTROL) 5 MG tablet TAKE 1/2 TABLET (2.'5mg'$ ) BY MOUTH EVERY DAY BEFORE BREAKFAST. 45 tablet 0  . losartan (COZAAR) 50 MG tablet Take 1 tablet (50 mg total) by mouth daily. 30 tablet 3  . metoprolol tartrate (LOPRESSOR) 25 MG tablet TAKE ONE TABLET BY MOUTH 2 TIMES A DAY 180 tablet 0  . rosuvastatin (CRESTOR) 20 MG tablet Take 1 tablet (20 mg total) by mouth daily. 30 tablet 2  . blood glucose meter kit and supplies KIT Dispense based on patient and insurance preference. Use up to four times daily as directed. (FOR ICD-9 250.00, 250.01). 1 each 0  . clonazePAM (KLONOPIN) 1 MG tablet Take 1 mg by mouth daily. 1/2 - 2 daily (Patient not taking: Reported on 11/15/2019)    . sildenafil (VIAGRA) 50 MG tablet 1-2 tabs 1  hour prior to intercourse (Patient not taking: Reported on 04/24/2019) 10 tablet 0   No facility-administered medications prior to visit.    No Known Allergies  ROS Review of Systems  Constitutional: Negative.   Eyes: Negative.   Respiratory: Negative.   Cardiovascular: Negative.   Endocrine: Negative.   Neurological: Negative.   Hematological: Negative.   Psychiatric/Behavioral: Negative.       Objective:    Physical Exam HENT:     Head: Normocephalic and atraumatic.  Mouth/Throat:     Mouth: Mucous membranes are moist.  Eyes:     Extraocular Movements: Extraocular movements intact.     Pupils: Pupils are equal, round, and reactive to light.  Cardiovascular:     Rate and Rhythm: Normal rate and regular rhythm.     Pulses: Normal pulses.     Heart sounds: Normal heart sounds.  Pulmonary:     Effort: Pulmonary effort is normal.     Breath sounds: Normal breath sounds.  Abdominal:     General: Bowel sounds are normal.     Palpations: Abdomen is soft.  Skin:    General: Skin is warm.  Neurological:     General: No focal deficit present.     Mental Status: He is alert and oriented to person, place, and time. Mental status is at baseline.  Psychiatric:        Mood and Affect: Mood normal.        Behavior: Behavior normal.        Thought Content: Thought content normal.        Judgment: Judgment normal.     BP (!) 132/85 (BP Location: Right Arm, Patient Position: Sitting)   Pulse 64   Ht '5\' 9"'$  (1.753 m)   Wt (!) 226 lb (102.5 kg)   SpO2 95%   BMI 33.37 kg/m  Wt Readings from Last 3 Encounters:  11/15/19 (!) 226 lb (102.5 kg)  11/07/19 230 lb 8 oz (104.6 kg)  08/14/19 224 lb (101.6 kg)   He lost 4 pounds in 1 week, and was advised to continue on his weight loss regimen.  Health Maintenance Due  Topic Date Due  . Hepatitis C Screening  Never done  . HIV Screening  Never done  . TETANUS/TDAP  Never done  . OPHTHALMOLOGY EXAM  11/25/2017  . COVID-19  Vaccine (2 - Moderna 2-dose series) 08/16/2019    There are no preventive care reminders to display for this patient.  Lab Results  Component Value Date   TSH 2.790 03/02/2018   Lab Results  Component Value Date   WBC 10.3 03/02/2018   HGB 17.7 03/02/2018   HCT 50.2 03/02/2018   MCV 86 03/02/2018   PLT 254 03/02/2018   Lab Results  Component Value Date   NA 142 02/21/2019   K 4.0 02/21/2019   CO2 23 02/21/2019   GLUCOSE 144 (H) 02/21/2019   BUN 12 02/21/2019   CREATININE 1.03 02/21/2019   BILITOT 0.2 02/21/2019   ALKPHOS 88 02/21/2019   AST 23 02/21/2019   ALT 32 02/21/2019   PROT 6.4 02/21/2019   ALBUMIN 4.5 02/21/2019   CALCIUM 9.3 02/21/2019   ANIONGAP 14 06/18/2015   Lab Results  Component Value Date   CHOL 134 02/21/2019   Lab Results  Component Value Date   HDL 35 (L) 02/21/2019   Lab Results  Component Value Date   LDLCALC 73 02/21/2019   Lab Results  Component Value Date   TRIG 149 02/21/2019   Lab Results  Component Value Date   CHOLHDL 3.8 02/21/2019   Lab Results  Component Value Date   HGBA1C 6.6 (H) 11/07/2019      Assessment & Plan:     1. Essential hypertension - His blood pressure is under control, he will continue on current treatment regimen, advised to continue on DASH diet, and exercise as tolerated. - clopidogrel (PLAVIX) 75 MG tablet; Take 1 tablet (75 mg total) by mouth daily.  Dispense: 90  tablet; Refill: 1 - losartan (COZAAR) 50 MG tablet; Take 1 tablet (50 mg total) by mouth daily.  Dispense: 30 tablet; Refill: 3 - metoprolol tartrate (LOPRESSOR) 25 MG tablet; TAKE ONE TABLET BY MOUTH 2 TIMES A DAY  Dispense: 180 tablet; Refill: 0 - Ambulatory referral to Cardiology  2. Diabetes mellitus without complication ([place]) - His HgbA1c was 6.6%, he defers increasing Glipizide to 5 mg, he states that he will work on losing weight and low carb and non concentrated sweet diet, and exercise as tolerated. - glipiZIDE (GLUCOTROL) 5  MG tablet; TAKE 1/2 TABLET (2.'5mg'$ ) BY MOUTH EVERY DAY BEFORE BREAKFAST.  Dispense: 90 tablet; Refill: 0  3. Hyperlipidemia, unspecified hyperlipidemia type - He will continue on current treatment regimen. -Low fat Diet, like low fat dairy products eg skimmed milk -Avoid any fried food -Regular exercise/walk -Goal for Total Cholesterol is less than 200 -Goal for bad cholesterol LDL is less than 70 -Goal for Good cholesterol HDL is more than 45 -Goal for Triglyceride is less than 150 - rosuvastatin (CRESTOR) 20 MG tablet; Take 1 tablet (20 mg total) by mouth daily.  Dispense: 30 tablet; Refill: 2   Follow-up: Return in about 8 weeks (around 01/10/2020), or if symptoms worsen or fail to improve.    Krishana Lutze Jerold Coombe, NP

## 2019-11-26 ENCOUNTER — Ambulatory Visit: Payer: Medicaid Other | Admitting: Cardiology

## 2019-12-05 DIAGNOSIS — G4700 Insomnia, unspecified: Secondary | ICD-10-CM | POA: Insufficient documentation

## 2019-12-17 ENCOUNTER — Ambulatory Visit (INDEPENDENT_AMBULATORY_CARE_PROVIDER_SITE_OTHER): Payer: Self-pay | Admitting: Cardiology

## 2019-12-17 ENCOUNTER — Other Ambulatory Visit: Payer: Self-pay

## 2019-12-17 ENCOUNTER — Encounter: Payer: Self-pay | Admitting: Cardiology

## 2019-12-17 VITALS — BP 144/98 | HR 59 | Ht 69.0 in | Wt 227.8 lb

## 2019-12-17 DIAGNOSIS — F17200 Nicotine dependence, unspecified, uncomplicated: Secondary | ICD-10-CM

## 2019-12-17 DIAGNOSIS — I10 Essential (primary) hypertension: Secondary | ICD-10-CM

## 2019-12-17 DIAGNOSIS — E7800 Pure hypercholesterolemia, unspecified: Secondary | ICD-10-CM

## 2019-12-17 DIAGNOSIS — I2510 Atherosclerotic heart disease of native coronary artery without angina pectoris: Secondary | ICD-10-CM

## 2019-12-17 NOTE — Patient Instructions (Signed)
Medication Instructions:  Your physician recommends that you continue on your current medications as directed. Please refer to the Current Medication list given to you today.  *If you need a refill on your cardiac medications before your next appointment, please call your pharmacy*   Lab Work: Your physician recommends that you have a FASTING lipid profile: drawn today.  If you have labs (blood work) drawn today and your tests are completely normal, you will receive your results only by: Marland Kitchen MyChart Message (if you have MyChart) OR . A paper copy in the mail If you have any lab test that is abnormal or we need to change your treatment, we will call you to review the results.   Testing/Procedures: Your physician has requested that you have an echocardiogram. Echocardiography is a painless test that uses sound waves to create images of your heart. It provides your doctor with information about the size and shape of your heart and how well your heart's chambers and valves are working. This procedure takes approximately one hour. There are no restrictions for this procedure.     Follow-Up: At [facility], you and your health needs are our priority.  As part of our continuing mission to provide you with exceptional heart care, we have created designated Provider Care Teams.  These Care Teams include your primary Cardiologist (physician) and Advanced Practice Providers (APPs -  Physician Assistants and Nurse Practitioners) who all work together to provide you with the care you need, when you need it.  We recommend signing up for the patient portal called "MyChart".  Sign up information is provided on this After Visit Summary.  MyChart is used to connect with patients for Virtual Visits (Telemedicine).  Patients are able to view lab/test results, encounter notes, upcoming appointments, etc.  Non-urgent messages can be sent to your provider as well.   To learn more about what you can do with  MyChart, go to [email or web address].    Your next appointment:   Follow up after Echo   The format for your next appointment:   In Person  Provider:   Kate Sable, MD   Other Instructions

## 2019-12-17 NOTE — Progress Notes (Signed)
Cardiology Office Note:    Date:  12/17/2019   ID:  Larry French, DOB 08/29/1960, MRN [id number]  PCP:  Langston Reusing, NP  [facility] Cardiologist:  Kate Sable, MD  [place] Electrophysiologist:  None   Referring MD: Langston Reusing, NP   Chief Complaint  Patient presents with  . New Patient (Initial Visit)    To establish care for essential hypertension and previous heart attack in 2013.  Medications verbally reviewed with patient.     History of Present Illness:    Larry French is a 59 y.o. male with a hx of CAD/MI/PCI in 2013 (stents to LAD 2013, mRCA x 2 2016), hypertension, hyperlipidemia, current smoker x25+ years who presents due to history of MI.  Patient had an MI in 2013, heart stents placed.  Has not really followed up with cardiology since.  Recently saw her PCP who recommended patient establishes care with cardiology.  Last heart cath in 2016 showed severe RCA disease, patient had 2 stents placed.  He states doing okay since, denies chest pain or shortness of breath at rest or with exertion.  Currently smokes.  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  Wanted to follow-up to make sure everything is okay with his heart.  Echocardiogram from 2016 showed normal EF 55 to 60%.  Past Medical History:  Diagnosis Date  . Alcohol abuse    Recovered x 15 months  . Anxiety   . CA - skin cancer   . CAD, residual CFX LAD disease 06/01/2011   a. s/p inferior ST elevation MI s/p PCI/DES to RCA on 05/29/2011; b. residual LAD and LCx CAD tx'd on 06/02/11 cath with LAD CAD successfully tx'd w/ PCI/DES, LCx managaged medically   . Depression   . Headache(784.0)   . Hyperlipemia   . Hypertension   . Insomnia   . Mental disorder   . MI (myocardial infarction) ([place])   . Polysubstance abuse ([place])    a. etoh, xanax, tobacco  . PTSD (post-traumatic stress disorder)     Past Surgical History:  Procedure Laterality Date  .  CARDIAC CATHETERIZATION Bilateral 04/09/2015   Procedure: Coronary Angiogram;  Surgeon: Wellington Hampshire, MD;  Location: [place] [facility];  Service: Cardiovascular;  Laterality: Bilateral;  . CARDIAC CATHETERIZATION [name] 04/09/2015   Procedure: Coronary Stent Intervention;  Surgeon: Wellington Hampshire, MD;  Location: [place] [facility];  Service: Cardiovascular;  Laterality: [name];  . COLONOSCOPY WITH PROPOFOL [name] 03/19/2019   Procedure: COLONOSCOPY WITH PROPOFOL;  Surgeon: Lin Landsman, MD;  Location[facility] ENDOSCOPY;  Service: Gastroenterology;  Laterality: [name];  . LEFT HEART CATH Right 05/29/2011   Procedure: LEFT HEART CATH;  Surgeon: Leonie Man, MD;  Location: [facility] CATH LAB;  Service: Cardiovascular;  Laterality: Right;  . LEFT HEART CATHETERIZATION WITH CORONARY ANGIOGRAM [name] 06/01/2011   Procedure: LEFT HEART CATHETERIZATION WITH CORONARY ANGIOGRAM;  Surgeon: Leonie Man, MD;  Location: [facility] CATH LAB;  Service: Cardiovascular;  Laterality: [name];  relook cath, possible PCI  . TONSILLECTOMY AND ADENOIDECTOMY      Current Medications: Current Meds  Medication Sig  . ALPRAZolam (XANAX) 0.5 MG tablet Take by mouth.  Marland Kitchen aspirin 81 MG EC tablet TAKE ONE TABLET BY MOUTH EVERY DAY  . blood glucose meter kit and supplies KIT Dispense based on patient and insurance preference. Use up to four times daily as directed. (FOR ICD-9 250.00, 250.01).  . clonazePAM (  KLONOPIN) 1 MG tablet Take 1 mg by mouth daily. 1/2 - 2 daily   . clopidogrel (PLAVIX) 75 MG tablet Take 1 tablet (75 mg total) by mouth daily.  Marland Kitchen gabapentin (NEURONTIN) 600 MG tablet Take 600 mg by mouth 3 (three) times daily.   Marland Kitchen glipiZIDE (GLUCOTROL) 5 MG tablet TAKE 1/2 TABLET (2.55m) BY MOUTH EVERY DAY BEFORE BREAKFAST.  .Marland Kitchenlosartan (COZAAR) 50 MG tablet Take 1 tablet (50 mg total) by mouth daily.  . metoprolol tartrate (LOPRESSOR) 25 MG tablet TAKE ONE TABLET BY MOUTH 2 TIMES A DAY  . QUEtiapine (SEROQUEL) 200 MG tablet Take 200  mg by mouth at bedtime. 1/2 - 2 tablets at bedtime as needed  . rosuvastatin (CRESTOR) 20 MG tablet Take 1 tablet (20 mg total) by mouth daily.  . sildenafil (VIAGRA) 50 MG tablet 1-2 tabs 1 hour prior to intercourse  . simvastatin (ZOCOR) 10 MG tablet Take by mouth.  . traZODone (DESYREL) 100 MG tablet Take 100 mg by mouth at bedtime. 1-2 tablets     Allergies:   Patient has no known allergies.   Social History   Socioeconomic History  . Marital status: Single    Spouse name: Not on file  . Number of children: 0  . Years of education: Not on file  . Highest education level: Not on file  Occupational History  . Occupation: PElectrical engineer [employer]  Tobacco Use  . Smoking status: Current Every Day Smoker    Packs/day: 1.00    Years: 30.00    Pack years: 30.00    Types: Cigarettes  . Smokeless tobacco: Never Used  Vaping Use  . Vaping Use: Never used  Substance and Sexual Activity  . Alcohol use: No    Comment: Previous user   . Drug use: No  . Sexual activity: Not Currently  Other Topics Concern  . Not on file  Social History Narrative  . Not on file   Social Determinants of Health   Financial Resource Strain:   . Difficulty of Paying Living Expenses: Not on file  Food Insecurity:   . Worried About RCharity fundraiserin the Last Year: Not on file  . Ran Out of Food in the Last Year: Not on file  Transportation Needs:   . Lack of Transportation (Medical): Not on file  . Lack of Transportation (Non-Medical): Not on file  Physical Activity:   . Days of Exercise per Week: Not on file  . Minutes of Exercise per Session: Not on file  Stress:   . Feeling of Stress : Not on file  Social Connections:   . Frequency of Communication with Friends and Family: Not on file  . Frequency of Social Gatherings with Friends and Family: Not on file  . Attends Religious Services: Not on file  . Active Member of Clubs or Organizations: Not on file  . Attends  CArchivistMeetings: Not on file  . Marital Status: Not on file     Family History: The patient's family history includes Anxiety disorder in his father and mother; Cancer in his mother; Coronary artery disease (age of onset: 548 in his maternal grandfather; Depression in his father and mother.  ROS:   Please see the history of present illness.     All other systems reviewed and are negative.  EKGs/Labs/Other Studies Reviewed:    The following studies were reviewed today:   EKG:  EKG is  ordered today.  The ekg ordered today demonstrates sinus bradycardia, heart rate 59  Recent Labs: 02/21/2019: ALT 32; BUN 12; Creatinine, Ser 1.03; Potassium 4.0; Sodium 142  Recent Lipid Panel    Component Value Date/Time   CHOL 134 02/21/2019 1141   TRIG 149 02/21/2019 1141   HDL 35 (L) 02/21/2019 1141   CHOLHDL 3.8 02/21/2019 1141   CHOLHDL 3.6 04/06/2015 0348   VLDL 47 (H) 04/06/2015 0348   LDLCALC 73 02/21/2019 1141    Physical Exam:    VS:  BP (!) 144/98 (BP Location: Right Arm, Patient Position: Sitting, Cuff Size: Normal)   Pulse (!) 59   Ht _0  (1.753 m)   Wt 227 lb 12.8 oz (103.3 kg)   SpO2 97%   BMI 33.64 kg/m     Wt Readings from Last 3 Encounters:  12/17/19 227 lb 12.8 oz (103.3 kg)  11/15/19 (!) 226 lb (102.5 kg)  11/07/19 230 lb 8 oz (104.6 kg)     GEN:  Well nourished, well developed in no acute distress HEENT: Normal NECK: No JVD; No carotid bruits LYMPHATICS: No lymphadenopathy CARDIAC: RRR, no murmurs, rubs, gallops RESPIRATORY:  Clear to auscultation without rales, wheezing or rhonchi  ABDOMEN: Soft, non-tender, non-distended MUSCULOSKELETAL:  No edema; No deformity  SKIN: Warm and dry NEUROLOGIC:  Alert and oriented x 3 PSYCHIATRIC:  Normal affect   ASSESSMENT:    1. Coronary artery disease involving native coronary artery of native heart without angina pectoris   2. Essential hypertension   3. Pure hypercholesterolemia   4. Smoking     PLAN:    In order of problems listed above:  1. Patient with history of CAD status post PCI to LAD and RCA.  Currently denies symptoms of chest pain.  Continue aspirin Plavix, Crestor as prescribed.  Get echocardiogram to evaluate cardiac function. 2. History of hypertension, BP elevated today typically controlled.  Patient advised to monitor blood pressure at home, if stays elevated we will plan to increase losartan at follow-up visit. 3. History of hyperlipidemia, last LDL 73.  Continue Crestor as prescribed.  Check fasting lipid profile today.  If LDL not at goal of less than 70, will plan to increase Crestor at follow-up visit. 4. Patient is a current smoker.  Smoking cessation advised.  Follow-up after echo and lipid panel.  Total encounter time 60 minutes  Greater than 50% was spent in counseling and coordination of care with the patient   This note was generated in part or whole with voice recognition software. Voice recognition is usually quite accurate but there are transcription errors that can and very often do occur. I apologize for any typographical errors that were not detected and corrected.  Medication Adjustments/Labs and Tests Ordered: Current medicines are reviewed at length with the patient today.  Concerns regarding medicines are outlined above.  Orders Placed This Encounter  Procedures  . Lipid panel  . EKG 12-Lead  . ECHOCARDIOGRAM COMPLETE   No orders of the defined types were placed in this encounter.   Patient Instructions  Medication Instructions:  Your physician recommends that you continue on your current medications as directed. Please refer to the Current Medication list given to you today.  *If you need a refill on your cardiac medications before your next appointment, please call your pharmacy*   Lab Work: Your physician recommends that you have a FASTING lipid profile: drawn today.  If you have labs (blood work) drawn today and your tests  are completely normal,  you will receive your results only by: Marland Kitchen MyChart Message (if you have MyChart) OR . A paper copy in the mail If you have any lab test that is abnormal or we need to change your treatment, we will call you to review the results.   Testing/Procedures: Your physician has requested that you have an echocardiogram. Echocardiography is a painless test that uses sound waves to create images of your heart. It provides your doctor with information about the size and shape of your heart and how well your heart's chambers and valves are working. This procedure takes approximately one hour. There are no restrictions for this procedure.     Follow-Up: At [facility], you and your health needs are our priority.  As part of our continuing mission to provide you with exceptional heart care, we have created designated Provider Care Teams.  These Care Teams include your primary Cardiologist (physician) and Advanced Practice Providers (APPs -  Physician Assistants and Nurse Practitioners) who all work together to provide you with the care you need, when you need it.  We recommend signing up for the patient portal called "MyChart".  Sign up information is provided on this After Visit Summary.  MyChart is used to connect with patients for Virtual Visits (Telemedicine).  Patients are able to view lab/test results, encounter notes, upcoming appointments, etc.  Non-urgent messages can be sent to your provider as well.   To learn more about what you can do with MyChart, go to [email or web address].    Your next appointment:   Follow up after Echo   The format for your next appointment:   In Person  Provider:   Kate Sable, MD   Other Instructions      Signed, Kate Sable, MD  12/17/2019 12:34 PM    [place]

## 2019-12-18 ENCOUNTER — Telehealth: Payer: Self-pay

## 2019-12-18 LAB — LIPID PANEL
Chol/HDL Ratio: 3.8 ratio (ref 0.0–5.0)
Cholesterol, Total: 129 mg/dL (ref 100–199)
HDL: 34 mg/dL — ABNORMAL LOW (ref >39)
LDL Chol Calc (NIH): 72 mg/dL (ref 0–99)
Triglycerides: 127 mg/dL (ref 0–149)
VLDL Cholesterol Cal: 23 mg/dL (ref 5–40)

## 2019-12-18 NOTE — Telephone Encounter (Signed)
Called and left message for patient to call back for lab results.

## 2019-12-20 NOTE — Telephone Encounter (Signed)
Left a second VM for patient to call back.

## 2020-01-08 ENCOUNTER — Other Ambulatory Visit: Payer: Self-pay | Admitting: Cardiology

## 2020-01-08 DIAGNOSIS — I2510 Atherosclerotic heart disease of native coronary artery without angina pectoris: Secondary | ICD-10-CM

## 2020-01-08 DIAGNOSIS — M7989 Other specified soft tissue disorders: Secondary | ICD-10-CM

## 2020-01-10 ENCOUNTER — Other Ambulatory Visit: Payer: Self-pay

## 2020-01-10 ENCOUNTER — Encounter: Payer: Self-pay | Admitting: Gerontology

## 2020-01-10 ENCOUNTER — Other Ambulatory Visit: Payer: Self-pay | Admitting: Cardiology

## 2020-01-10 ENCOUNTER — Ambulatory Visit: Payer: Medicaid Other | Admitting: Gerontology

## 2020-01-10 VITALS — BP 137/84 | HR 83 | Resp 18 | Ht 68.0 in | Wt 227.9 lb

## 2020-01-10 DIAGNOSIS — W19XXXA Unspecified fall, initial encounter: Secondary | ICD-10-CM | POA: Insufficient documentation

## 2020-01-10 DIAGNOSIS — R9439 Abnormal result of other cardiovascular function study: Secondary | ICD-10-CM

## 2020-01-10 DIAGNOSIS — E119 Type 2 diabetes mellitus without complications: Secondary | ICD-10-CM

## 2020-01-10 DIAGNOSIS — E785 Hyperlipidemia, unspecified: Secondary | ICD-10-CM

## 2020-01-10 DIAGNOSIS — M7989 Other specified soft tissue disorders: Secondary | ICD-10-CM

## 2020-01-10 DIAGNOSIS — I10 Essential (primary) hypertension: Secondary | ICD-10-CM

## 2020-01-10 NOTE — Patient Instructions (Signed)
DASH Eating Plan DASH stands for "Dietary Approaches to Stop Hypertension." The DASH eating plan is a healthy eating plan that has been shown to reduce high blood pressure (hypertension). It may also reduce your risk for type 2 diabetes, heart disease, and stroke. The DASH eating plan may also help with weight loss. What are tips for following this plan?  General guidelines  Avoid eating more than 2,300 mg (milligrams) of salt (sodium) a day. If you have hypertension, you may need to reduce your sodium intake to 1,500 mg a day.  Limit alcohol intake to no more than 1 drink a day for nonpregnant women and 2 drinks a day for men. One drink equals 12 oz of beer, 5 oz of wine, or 1 oz of hard liquor.  Work with your health care provider to maintain a healthy body weight or to lose weight. Ask what an ideal weight is for you.  Get at least 30 minutes of exercise that causes your heart to beat faster (aerobic exercise) most days of the week. Activities may include walking, swimming, or biking.  Work with your health care provider or diet and nutrition specialist (dietitian) to adjust your eating plan to your individual calorie needs. Reading food labels   Check food labels for the amount of sodium per serving. Choose foods with less than 5 percent of the Daily Value of sodium. Generally, foods with less than 300 mg of sodium per serving fit into this eating plan.  To find whole grains, look for the word "whole" as the first word in the ingredient list. Shopping  Buy products labeled as "low-sodium" or "no salt added."  Buy fresh foods. Avoid canned foods and premade or frozen meals. Cooking  Avoid adding salt when cooking. Use salt-free seasonings or herbs instead of table salt or sea salt. Check with your health care provider or pharmacist before using salt substitutes.  Do not fry foods. Cook foods using healthy methods such as baking, boiling, grilling, and broiling instead.  Cook with  heart-healthy oils, such as olive, canola, soybean, or sunflower oil. Meal planning  Eat a balanced diet that includes: ? 5 or more servings of fruits and vegetables each day. At each meal, try to fill half of your plate with fruits and vegetables. ? Up to 6-8 servings of whole grains each day. ? Less than 6 oz of lean meat, poultry, or fish each day. A 3-oz serving of meat is about the same size as a deck of cards. One egg equals 1 oz. ? 2 servings of low-fat dairy each day. ? A serving of nuts, seeds, or beans 5 times each week. ? Heart-healthy fats. Healthy fats called Omega-3 fatty acids are found in foods such as flaxseeds and coldwater fish, like sardines, salmon, and mackerel.  Limit how much you eat of the following: ? Canned or prepackaged foods. ? Food that is high in trans fat, such as fried foods. ? Food that is high in saturated fat, such as fatty meat. ? Sweets, desserts, sugary drinks, and other foods with added sugar. ? Full-fat dairy products.  Do not salt foods before eating.  Try to eat at least 2 vegetarian meals each week.  Eat more home-cooked food and less restaurant, buffet, and fast food.  When eating at a restaurant, ask that your food be prepared with less salt or no salt, if possible. What foods are recommended? The items listed may not be a complete list. Talk with your dietitian about   what dietary choices are best for you. Grains Whole-grain or whole-wheat bread. Whole-grain or whole-wheat pasta. Brown rice. Oatmeal. Quinoa. Bulgur. Whole-grain and low-sodium cereals. Pita bread. Low-fat, low-sodium crackers. Whole-wheat flour tortillas. Vegetables Fresh or frozen vegetables (raw, steamed, roasted, or grilled). Low-sodium or reduced-sodium tomato and vegetable juice. Low-sodium or reduced-sodium tomato sauce and tomato paste. Low-sodium or reduced-sodium canned vegetables. Fruits All fresh, dried, or frozen fruit. Canned fruit in natural juice (without  added sugar). Meat and other protein foods Skinless chicken or turkey. Ground chicken or turkey. Pork with fat trimmed off. Fish and seafood. Egg whites. Dried beans, peas, or lentils. Unsalted nuts, nut butters, and seeds. Unsalted canned beans. Lean cuts of beef with fat trimmed off. Low-sodium, lean deli meat. Dairy Low-fat (1%) or fat-free (skim) milk. Fat-free, low-fat, or reduced-fat cheeses. Nonfat, low-sodium ricotta or cottage cheese. Low-fat or nonfat yogurt. Low-fat, low-sodium cheese. Fats and oils Soft margarine without trans fats. Vegetable oil. Low-fat, reduced-fat, or light mayonnaise and salad dressings (reduced-sodium). Canola, safflower, olive, soybean, and sunflower oils. Avocado. Seasoning and other foods Herbs. Spices. Seasoning mixes without salt. Unsalted popcorn and pretzels. Fat-free sweets. What foods are not recommended? The items listed may not be a complete list. Talk with your dietitian about what dietary choices are best for you. Grains Baked goods made with fat, such as croissants, muffins, or some breads. Dry pasta or rice meal packs. Vegetables Creamed or fried vegetables. Vegetables in a cheese sauce. Regular canned vegetables (not low-sodium or reduced-sodium). Regular canned tomato sauce and paste (not low-sodium or reduced-sodium). Regular tomato and vegetable juice (not low-sodium or reduced-sodium). Pickles. Olives. Fruits Canned fruit in a light or heavy syrup. Fried fruit. Fruit in cream or butter sauce. Meat and other protein foods Fatty cuts of meat. Ribs. Fried meat. Bacon. Sausage. Bologna and other processed lunch meats. Salami. Fatback. Hotdogs. Bratwurst. Salted nuts and seeds. Canned beans with added salt. Canned or smoked fish. Whole eggs or egg yolks. Chicken or turkey with skin. Dairy Whole or 2% milk, cream, and half-and-half. Whole or full-fat cream cheese. Whole-fat or sweetened yogurt. Full-fat cheese. Nondairy creamers. Whipped toppings.  Processed cheese and cheese spreads. Fats and oils Butter. Stick margarine. Lard. Shortening. Ghee. Bacon fat. Tropical oils, such as coconut, palm kernel, or palm oil. Seasoning and other foods Salted popcorn and pretzels. Onion salt, garlic salt, seasoned salt, table salt, and sea salt. Worcestershire sauce. Tartar sauce. Barbecue sauce. Teriyaki sauce. Soy sauce, including reduced-sodium. Steak sauce. Canned and packaged gravies. Fish sauce. Oyster sauce. Cocktail sauce. Horseradish that you find on the shelf. Ketchup. Mustard. Meat flavorings and tenderizers. Bouillon cubes. Hot sauce and Tabasco sauce. Premade or packaged marinades. Premade or packaged taco seasonings. Relishes. Regular salad dressings. Where to find more information:  National Heart, Lung, and Blood Institute: www.nhlbi.nih.gov  American Heart Association: www.heart.org Summary  The DASH eating plan is a healthy eating plan that has been shown to reduce high blood pressure (hypertension). It may also reduce your risk for type 2 diabetes, heart disease, and stroke.  With the DASH eating plan, you should limit salt (sodium) intake to 2,300 mg a day. If you have hypertension, you may need to reduce your sodium intake to 1,500 mg a day.  When on the DASH eating plan, aim to eat more fresh fruits and vegetables, whole grains, lean proteins, low-fat dairy, and heart-healthy fats.  Work with your health care provider or diet and nutrition specialist (dietitian) to adjust your eating plan to your   individual calorie needs. This information is not intended to replace advice given to you by your health care provider. Make sure you discuss any questions you have with your health care provider. Document Revised: 03/18/2017 Document Reviewed: 03/29/2016 Elsevier Patient Education  2020 Elsevier Inc. Carbohydrate Counting for Diabetes Mellitus, Adult  Carbohydrate counting is a method of keeping track of how many carbohydrates you  eat. Eating carbohydrates naturally increases the amount of sugar (glucose) in the blood. Counting how many carbohydrates you eat helps keep your blood glucose within normal limits, which helps you manage your diabetes (diabetes mellitus). It is important to know how many carbohydrates you can safely have in each meal. This is different for every person. A diet and nutrition specialist (registered dietitian) can help you make a meal plan and calculate how many carbohydrates you should have at each meal and snack. Carbohydrates are found in the following foods:  Grains, such as breads and cereals.  Dried beans and soy products.  Starchy vegetables, such as potatoes, peas, and corn.  Fruit and fruit juices.  Milk and yogurt.  Sweets and snack foods, such as cake, cookies, candy, chips, and soft drinks. How do I count carbohydrates? There are two ways to count carbohydrates in food. You can use either of the methods or a combination of both. Reading "Nutrition Facts" on packaged food The "Nutrition Facts" list is included on the labels of almost all packaged foods and beverages in the [place] It includes:  The serving size.  Information about nutrients in each serving, including the grams (g) of carbohydrate per serving. To use the "Nutrition Facts":  Decide how many servings you will have.  Multiply the number of servings by the number of carbohydrates per serving.  The resulting number is the total amount of carbohydrates that you will be having. Learning standard serving sizes of other foods When you eat carbohydrate foods that are not packaged or do not include "Nutrition Facts" on the label, you need to measure the servings in order to count the amount of carbohydrates:  Measure the foods that you will eat with a food scale or measuring cup, if needed.  Decide how many standard-size servings you will eat.  Multiply the number of servings by 15. Most carbohydrate-rich foods have  about 15 g of carbohydrates per serving. ? For example, if you eat 8 oz (170 g) of strawberries, you will have eaten 2 servings and 30 g of carbohydrates (2 servings x 15 g = 30 g).  For foods that have more than one food mixed, such as soups and casseroles, you must count the carbohydrates in each food that is included. The following list contains standard serving sizes of common carbohydrate-rich foods. Each of these servings has about 15 g of carbohydrates:   hamburger bun or  English muffin.   oz (15 mL) syrup.   oz (14 g) jelly.  1 slice of bread.  1 six-inch tortilla.  3 oz (85 g) cooked rice or pasta.  4 oz (113 g) cooked dried beans.  4 oz (113 g) starchy vegetable, such as peas, corn, or potatoes.  4 oz (113 g) hot cereal.  4 oz (113 g) mashed potatoes or  of a large baked potato.  4 oz (113 g) canned or frozen fruit.  4 oz (120 mL) fruit juice.  4-6 crackers.  6 chicken nuggets.  6 oz (170 g) unsweetened dry cereal.  6 oz (170 g) plain fat-free yogurt or yogurt sweetened with   artificial sweeteners.  8 oz (240 mL) milk.  8 oz (170 g) fresh fruit or one small piece of fruit.  24 oz (680 g) popped popcorn. Example of carbohydrate counting Sample meal  3 oz (85 g) chicken breast.  6 oz (170 g) brown rice.  4 oz (113 g) corn.  8 oz (240 mL) milk.  8 oz (170 g) strawberries with sugar-free whipped topping. Carbohydrate calculation 1. Identify the foods that contain carbohydrates: ? Rice. ? Corn. ? Milk. ? Strawberries. 2. Calculate how many servings you have of each food: ? 2 servings rice. ? 1 serving corn. ? 1 serving milk. ? 1 serving strawberries. 3. Multiply each number of servings by 15 g: ? 2 servings rice x 15 g = 30 g. ? 1 serving corn x 15 g = 15 g. ? 1 serving milk x 15 g = 15 g. ? 1 serving strawberries x 15 g = 15 g. 4. Add together all of the amounts to find the total grams of carbohydrates eaten: ? 30 g + 15 g + 15 g + 15  g = 75 g of carbohydrates total. Summary  Carbohydrate counting is a method of keeping track of how many carbohydrates you eat.  Eating carbohydrates naturally increases the amount of sugar (glucose) in the blood.  Counting how many carbohydrates you eat helps keep your blood glucose within normal limits, which helps you manage your diabetes.  A diet and nutrition specialist (registered dietitian) can help you make a meal plan and calculate how many carbohydrates you should have at each meal and snack. This information is not intended to replace advice given to you by your health care provider. Make sure you discuss any questions you have with your health care provider. Document Revised: 10/28/2016 Document Reviewed: 09/17/2015 Elsevier Patient Education  2020 Elsevier Inc.  

## 2020-01-10 NOTE — Progress Notes (Signed)
Established Patient Office Visit  Subjective:  Patient ID: Larry French, male    DOB: 1961/02/20  Age: 59 y.o. MRN: [id number]  CC:  Chief Complaint  Patient presents with  . Diabetes    HPI ANJELO PULLMAN presents for follow up of Type 2 Diabetes mellitus. His HgbA1c done on 12/05/2019 at [facility] clinic  Increased from 6.6% to 6.9%. He states that he was taking 2.5 mg of Glipizide instead of 5 mg daily. He reports that he continue to work on his diet. He doesn't check his blood glucose but it was 158 mg/dl when checked during visit. He denies hypoglycemic/hyperglycemic symptoms, peripheral neuropathy and performs foot checks. He was seen by Cardiologist Dr Kate Sable on 12/17/2019 to establish care and he will follow up with Echo and Lipid panel. He denies chest pain and endorses mild shortness of breath with exertion. He reports that he tripped over his covers and fell on his right temple hitting a furniture, and bruised his wrist. He denies pain, swelling,headache, vision changes, unsteady gait or balance, but endorses mild dizziness, mild confusion but he's alert and oriented x3. He reports that he will go to the ED with worsening symptoms.Overall, he states that he's doing well and offers no further complaint.  Past Medical History:  Diagnosis Date  . Alcohol abuse    Recovered x 15 months  . Anxiety   . CA - skin cancer   . CAD, residual CFX LAD disease 06/01/2011   a. s/p inferior ST elevation MI s/p PCI/DES to RCA on 05/29/2011; b. residual LAD and LCx CAD tx'd on 06/02/11 cath with LAD CAD successfully tx'd w/ PCI/DES, LCx managaged medically   . Depression   . Headache(784.0)   . Hyperlipemia   . Hypertension   . Insomnia   . Mental disorder   . MI (myocardial infarction) ([place])   . Polysubstance abuse ([place])    a. etoh, xanax, tobacco  . PTSD (post-traumatic stress disorder)     Past Surgical History:  Procedure Laterality Date  . CARDIAC  CATHETERIZATION Bilateral 04/09/2015   Procedure: Coronary Angiogram;  Surgeon: Wellington Hampshire, MD;  Location: [place] [facility] LAB;  Service: Cardiovascular;  Laterality: Bilateral;  . CARDIAC CATHETERIZATION [name] 04/09/2015   Procedure: Coronary Stent Intervention;  Surgeon: Wellington Hampshire, MD;  Location: [place] [facility];  Service: Cardiovascular;  Laterality: [name];  . COLONOSCOPY WITH PROPOFOL [name] 03/19/2019   Procedure: COLONOSCOPY WITH PROPOFOL;  Surgeon: Lin Landsman, MD;  Location: [facility] ENDOSCOPY;  Service: Gastroenterology;  Laterality: [name];  . LEFT HEART CATH Right 05/29/2011   Procedure: LEFT HEART CATH;  Surgeon: Leonie Man, MD;  Location: [facility] CATH LAB;  Service: Cardiovascular;  Laterality: Right;  . LEFT HEART CATHETERIZATION WITH CORONARY ANGIOGRAM [name] 06/01/2011   Procedure: LEFT HEART CATHETERIZATION WITH CORONARY ANGIOGRAM;  Surgeon: Leonie Man, MD;  Location: [facility] CATH LAB;  Service: Cardiovascular;  Laterality: [name];  relook cath, possible PCI  . TONSILLECTOMY AND ADENOIDECTOMY      Family History  Problem Relation Age of Onset  . Cancer Mother        Non-Hodgkin lymphoma  . Depression Mother   . Anxiety disorder Mother   . Depression Father   . Anxiety disorder Father   . Coronary artery disease Maternal Grandfather 78       Died    Social History   Socioeconomic History  . Marital status: Single    Spouse name: Not  on file  . Number of children: 0  . Years of education: Not on file  . Highest education level: Not on file  Occupational History  . Occupation: Electrical engineer: [employer]  Tobacco Use  . Smoking status: Current Every Day Smoker    Packs/day: 1.00    Years: 30.00    Pack years: 30.00    Types: Cigarettes  . Smokeless tobacco: Never Used  Vaping Use  . Vaping Use: Never used  Substance and Sexual Activity  . Alcohol use: No    Comment: Previous user   . Drug use: No  . Sexual activity: Not Currently   Other Topics Concern  . Not on file  Social History Narrative  . Not on file   Social Determinants of Health   Financial Resource Strain:   . Difficulty of Paying Living Expenses: Not on file  Food Insecurity:   . Worried About Charity fundraiser in the Last Year: Not on file  . Ran Out of Food in the Last Year: Not on file  Transportation Needs:   . Lack of Transportation (Medical): Not on file  . Lack of Transportation (Non-Medical): Not on file  Physical Activity:   . Days of Exercise per Week: Not on file  . Minutes of Exercise per Session: Not on file  Stress:   . Feeling of Stress : Not on file  Social Connections:   . Frequency of Communication with Friends and Family: Not on file  . Frequency of Social Gatherings with Friends and Family: Not on file  . Attends Religious Services: Not on file  . Active Member of Clubs or Organizations: Not on file  . Attends Archivist Meetings: Not on file  . Marital Status: Not on file  Intimate Partner Violence:   . Fear of Current or Ex-Partner: Not on file  . Emotionally Abused: Not on file  . Physically Abused: Not on file  . Sexually Abused: Not on file    Outpatient Medications Prior to Visit  Medication Sig Dispense Refill  . aspirin 81 MG EC tablet TAKE ONE TABLET BY MOUTH EVERY DAY 90 tablet 0  . clonazePAM (KLONOPIN) 1 MG tablet Take 1 mg by mouth 2 (two) times daily. 1/2 - 2 daily    . clopidogrel (PLAVIX) 75 MG tablet Take 1 tablet (75 mg total) by mouth daily. 90 tablet 1  . gabapentin (NEURONTIN) 600 MG tablet Take 600 mg by mouth 3 (three) times daily.     Marland Kitchen glipiZIDE (GLUCOTROL) 5 MG tablet TAKE 1/2 TABLET (2.$RemoveBefor'5mg'cvDbXfjghnnR$ ) BY MOUTH EVERY DAY BEFORE BREAKFAST. 90 tablet 0  . metoprolol tartrate (LOPRESSOR) 25 MG tablet TAKE ONE TABLET BY MOUTH 2 TIMES A DAY 180 tablet 0  . QUEtiapine (SEROQUEL) 200 MG tablet Take 200 mg by mouth at bedtime. 1/2 - 2 tablets at bedtime as needed    . rosuvastatin (CRESTOR) 20 MG  tablet Take 1 tablet (20 mg total) by mouth daily. 30 tablet 2  . traZODone (DESYREL) 100 MG tablet Take 100 mg by mouth at bedtime. 1-2 tablets    . simvastatin (ZOCOR) 10 MG tablet Take by mouth.    . ALPRAZolam (XANAX) 0.5 MG tablet Take by mouth. (Patient not taking: Reported on 01/10/2020)    . blood glucose meter kit and supplies KIT Dispense based on patient and insurance preference. Use up to four times daily as directed. (FOR ICD-9 250.00, 250.01). 1 each 0  .  losartan (COZAAR) 50 MG tablet Take 1 tablet (50 mg total) by mouth daily. 30 tablet 3  . sildenafil (VIAGRA) 50 MG tablet 1-2 tabs 1 hour prior to intercourse (Patient not taking: Reported on 01/10/2020) 10 tablet 0   No facility-administered medications prior to visit.    No Known Allergies  ROS Review of Systems  Constitutional: Negative.   Eyes: Negative.   Respiratory: Positive for shortness of breath (on exertion).   Cardiovascular: Negative.   Endocrine: Negative.   Neurological: Positive for dizziness (mild dizziness after fall). Negative for headaches.  Psychiatric/Behavioral: Negative.       Objective:    Physical Exam HENT:     Head: Normocephalic and atraumatic.  Eyes:     Extraocular Movements: Extraocular movements intact.     Pupils: Pupils are equal, round, and reactive to light.  Cardiovascular:     Rate and Rhythm: Normal rate and regular rhythm.     Pulses: Normal pulses.     Heart sounds: Normal heart sounds.  Pulmonary:     Effort: Pulmonary effort is normal.     Breath sounds: Normal breath sounds.  Skin:    Findings: Bruising (bruised forearm measuring approximately 3 cm.) present.  Neurological:     General: No focal deficit present.     Mental Status: He is alert and oriented to person, place, and time. Mental status is at baseline.  Psychiatric:        Mood and Affect: Mood normal.        Behavior: Behavior normal.        Thought Content: Thought content normal.        Judgment:  Judgment normal.     BP 137/84 (BP Location: Left Arm, Patient Position: Sitting)   Pulse 83   Resp 18   Ht $R'5\' 8"'De$  (1.727 m)   Wt 227 lb 14.4 oz (103.4 kg)   SpO2 95%   BMI 34.65 kg/m  Wt Readings from Last 3 Encounters:  01/10/20 227 lb 14.4 oz (103.4 kg)  12/17/19 227 lb 12.8 oz (103.3 kg)  11/15/19 (!) 226 lb (102.5 kg)   He was encouraged to lose weight  Health Maintenance Due  Topic Date Due  . Hepatitis C Screening  Never done  . HIV Screening  Never done  . TETANUS/TDAP  Never done  . OPHTHALMOLOGY EXAM  11/25/2017  . COVID-19 Vaccine (2 - Moderna 2-dose series) 08/16/2019  . INFLUENZA VACCINE  11/18/2019  . URINE MICROALBUMIN  02/21/2020    There are no preventive care reminders to display for this patient.  Lab Results  Component Value Date   TSH 2.790 03/02/2018   Lab Results  Component Value Date   WBC 10.3 03/02/2018   HGB 17.7 03/02/2018   HCT 50.2 03/02/2018   MCV 86 03/02/2018   PLT 254 03/02/2018   Lab Results  Component Value Date   NA 142 02/21/2019   K 4.0 02/21/2019   CO2 23 02/21/2019   GLUCOSE 144 (H) 02/21/2019   BUN 12 02/21/2019   CREATININE 1.03 02/21/2019   BILITOT 0.2 02/21/2019   ALKPHOS 88 02/21/2019   AST 23 02/21/2019   ALT 32 02/21/2019   PROT 6.4 02/21/2019   ALBUMIN 4.5 02/21/2019   CALCIUM 9.3 02/21/2019   ANIONGAP 14 06/18/2015   Lab Results  Component Value Date   CHOL 129 12/17/2019   Lab Results  Component Value Date   HDL 34 (L) 12/17/2019   Lab Results  Component  Value Date   LDLCALC 72 12/17/2019   Lab Results  Component Value Date   TRIG 127 12/17/2019   Lab Results  Component Value Date   CHOLHDL 3.8 12/17/2019   Lab Results  Component Value Date   HGBA1C 6.6 (H) 11/07/2019      Assessment & Plan:      1. Essential hypertension -His blood pressure was under control, he will continue on current treatment regimen, DASH diet and exercise as tolerated.  2. Diabetes mellitus without  complication ([place]) - His HgbA1c was 6.9%, he was advised to take 5 mg Glipizide daily, check blood glucose daily, record and bring log to follow up appointment. He will continue on low carb/ non concentrated sweet diet. - HgB A1c; Future - Urine Microalbumin w/creat. ratio; Future  3. Fall, initial encounter - He reports mild dizziness and confusion, he was advised to go to the ED for worsening symptoms and he verbalized understanding and is competent to make decision.   Follow-up: Return in about 2 months (around 03/11/2020), or if symptoms worsen or fail to improve.    Syniah Berne Jerold Coombe, NP

## 2020-01-11 ENCOUNTER — Ambulatory Visit (INDEPENDENT_AMBULATORY_CARE_PROVIDER_SITE_OTHER): Payer: Self-pay

## 2020-01-11 DIAGNOSIS — M799 Soft tissue disorder, unspecified: Secondary | ICD-10-CM

## 2020-01-11 DIAGNOSIS — M7989 Other specified soft tissue disorders: Secondary | ICD-10-CM

## 2020-01-11 DIAGNOSIS — M79609 Pain in unspecified limb: Secondary | ICD-10-CM

## 2020-01-11 DIAGNOSIS — R9439 Abnormal result of other cardiovascular function study: Secondary | ICD-10-CM

## 2020-01-11 LAB — ECHOCARDIOGRAM COMPLETE
AR max vel: 2.85 cm2
AV Area VTI: 3.12 cm2
AV Area mean vel: 2.91 cm2
AV Mean grad: 4 mmHg
AV Peak grad: 7.4 mmHg
Ao pk vel: 1.36 m/s
Area-P 1/2: 3.43 cm2
Calc EF: 53.6 %
S' Lateral: 2.6 cm
Single Plane A2C EF: 55.4 %
Single Plane A4C EF: 52.5 %

## 2020-01-17 ENCOUNTER — Ambulatory Visit: Payer: Self-pay | Admitting: Cardiology

## 2020-02-19 ENCOUNTER — Other Ambulatory Visit: Payer: Self-pay | Admitting: Gerontology

## 2020-02-19 DIAGNOSIS — E119 Type 2 diabetes mellitus without complications: Secondary | ICD-10-CM

## 2020-02-19 MED ORDER — GLIPIZIDE 5 MG PO TABS: ORAL_TABLET | ORAL | 0 refills | Status: DC

## 2020-02-22 ENCOUNTER — Other Ambulatory Visit: Payer: Self-pay | Admitting: Internal Medicine

## 2020-03-05 ENCOUNTER — Other Ambulatory Visit: Payer: Medicaid Other

## 2020-03-11 ENCOUNTER — Ambulatory Visit: Payer: Medicaid Other | Admitting: Gerontology

## 2020-04-23 ENCOUNTER — Other Ambulatory Visit: Payer: Self-pay | Admitting: Gerontology

## 2020-04-23 DIAGNOSIS — E119 Type 2 diabetes mellitus without complications: Secondary | ICD-10-CM

## 2020-04-28 ENCOUNTER — Ambulatory Visit: Payer: Medicaid Other | Admitting: Pharmacist

## 2020-04-28 ENCOUNTER — Other Ambulatory Visit: Payer: Self-pay

## 2020-04-28 DIAGNOSIS — Z79899 Other long term (current) drug therapy: Secondary | ICD-10-CM

## 2020-04-29 ENCOUNTER — Encounter: Payer: Self-pay | Admitting: Pharmacist

## 2020-04-29 ENCOUNTER — Other Ambulatory Visit: Payer: Self-pay

## 2020-04-29 ENCOUNTER — Other Ambulatory Visit: Payer: Self-pay | Admitting: Gerontology

## 2020-04-29 NOTE — Progress Notes (Signed)
Medication Management Clinic Visit Note  Patient: Larry French MRN: [id number] Date of Birth: 1961/02/17 PCP: Langston Reusing, NP   [name] 60 y.o. male presents for a telephone visit today for MTM. Verified patient with two identifiers.   There were no vitals taken for this visit.  Patient Information   Past Medical History:  Diagnosis Date  . Alcohol abuse    Recovered x 15 months  . Anxiety   . CA - skin cancer   . CAD, residual CFX LAD disease 06/01/2011   a. s/p inferior ST elevation MI s/p PCI/DES to RCA on 05/29/2011; b. residual LAD and LCx CAD tx'd on 06/02/11 cath with LAD CAD successfully tx'd w/ PCI/DES, LCx managaged medically   . Depression   . Headache(784.0)   . Hyperlipemia   . Hypertension   . Insomnia   . Mental disorder   . MI (myocardial infarction) ([place])   . Polysubstance abuse ([place])    a. etoh, xanax, tobacco  . PTSD (post-traumatic stress disorder)       Past Surgical History:  Procedure Laterality Date  . CARDIAC CATHETERIZATION Bilateral 04/09/2015   Procedure: Coronary Angiogram;  Surgeon: Wellington Hampshire, MD;  Location: [place] [facility] LAB;  Service: Cardiovascular;  Laterality: Bilateral;  . CARDIAC CATHETERIZATION [name] 04/09/2015   Procedure: Coronary Stent Intervention;  Surgeon: Wellington Hampshire, MD;  Location: [place] [facility];  Service: Cardiovascular;  Laterality: [name];  . COLONOSCOPY WITH PROPOFOL [name] 03/19/2019   Procedure: COLONOSCOPY WITH PROPOFOL;  Surgeon: Lin Landsman, MD;  Location: [facility] ENDOSCOPY;  Service: Gastroenterology;  Laterality: [name];  . LEFT HEART CATH Right 05/29/2011   Procedure: LEFT HEART CATH;  Surgeon: Leonie Man, MD;  Location: [facility] CATH LAB;  Service: Cardiovascular;  Laterality: Right;  . LEFT HEART CATHETERIZATION WITH CORONARY ANGIOGRAM [name] 06/01/2011   Procedure: LEFT HEART CATHETERIZATION WITH CORONARY ANGIOGRAM;  Surgeon: Leonie Man, MD;  [facility]: [facility] CATH LAB;   Service: Cardiovascular;  Laterality: [name];  relook cath, possible PCI  . TONSILLECTOMY AND ADENOIDECTOMY       Family History  Problem Relation Age of Onset  . Cancer Mother        Non-Hodgkin lymphoma  . Depression Mother   . Anxiety disorder Mother   . Depression Father   . Anxiety disorder Father   . Coronary artery disease Maternal Grandfather 31       Died    Family Support: Poor - mainly due to the COVID issues  Lifestyle Diet: Breakfast: skips because not hungry; instead he drinks juice or gatorade Lunch: rotisserie chicken, salad, cheese and crackers (typically healthy for lunch) Dinner: grilled chicken, pasta Drinks: gatorade, tea, pepsi, water  *trying to quit sodas    Current Exercise Habits: The patient has a physically strenuous job, but has no regular exercise apart from work. (Pt constantly walking at work -- ~5000-7000 steps 3-4x/wk). Plans to start going to the gym soon but in the meantime will continue to burn 1000 calories per day which he tracks on his fitbit.        Social History   Substance and Sexual Activity  Alcohol Use No   Comment: Previous user (sober x 5 years)      Social History   Tobacco Use  Smoking Status Current Every Day Smoker  . Packs/day: 1.00  . Years: 30.00  . Pack years: 30.00  . Types: Cigarettes  Smokeless Tobacco Never Used  Health Maintenance  Topic Date Due  . Hepatitis C Screening  Never done  . HIV Screening  Never done  . TETANUS/TDAP  Never done  . OPHTHALMOLOGY EXAM  11/25/2017  . COVID-19 Vaccine (2 - Moderna risk 4-dose series) 08/16/2019  . URINE MICROALBUMIN  02/21/2020  . FOOT EXAM  02/28/2020  . HEMOGLOBIN A1C  05/09/2020  . COLONOSCOPY (Pts 45-41yrs Insurance coverage will need to be confirmed)  03/18/2024  . INFLUENZA VACCINE  Completed  . PNEUMOCOCCAL POLYSACCHARIDE VACCINE AGE 70-64 HIGH RISK  Completed   Health Maintenance/Date Completed  Last Visit to PCP: 01/10/2020 Next Visit to  PCP: Patient plans to call today (04/29/2020) to schedule appt for bloodwork and f/u Dental Exam: ~1 year ago Eye Exam: ~6 mo ago Colonoscopy: Jan or Feb 2021 Flu Vaccine: completed Pneumonia Vaccine: completed COVID-19 Vaccine: completed (including booster) Shingrix Vaccine: due - pt will ask at his next appt  Outpatient Encounter Medications as of 04/28/2020  Medication Sig  . aspirin 81 MG EC tablet TAKE ONE TABLET BY MOUTH EVERY DAY  . blood glucose meter kit and supplies KIT Dispense based on patient and insurance preference. Use up to four times daily as directed. (FOR ICD-9 250.00, 250.01).  . clonazePAM (KLONOPIN) 1 MG tablet Take 1 mg by mouth 2 (two) times daily as needed for anxiety.  . clopidogrel (PLAVIX) 75 MG tablet Take 1 tablet (75 mg total) by mouth daily.  Marland Kitchen gabapentin (NEURONTIN) 600 MG tablet Take 600 mg by mouth 2 (two) times daily.  Marland Kitchen glipiZIDE (GLUCOTROL) 5 MG tablet TAKE 1 TABLET ($RemoveB'5mg'PkBEdTaB$ ) BY MOUTH EVERY DAY BEFORE BREAKFAST.  Marland Kitchen losartan (COZAAR) 50 MG tablet Take 1 tablet (50 mg total) by mouth daily.  . metoprolol tartrate (LOPRESSOR) 25 MG tablet TAKE ONE TABLET BY MOUTH 2 TIMES A DAY  . QUEtiapine (SEROQUEL) 200 MG tablet Take 400 mg by mouth at bedtime.  . rosuvastatin (CRESTOR) 20 MG tablet Take 1 tablet (20 mg total) by mouth daily. (Patient taking differently: Take 10 mg by mouth daily.)  . traZODone (DESYREL) 100 MG tablet 1-2 tablets PO HS  . [DISCONTINUED] ALPRAZolam (XANAX) 0.5 MG tablet Take by mouth. (Patient not taking: Reported on 01/10/2020)  . [DISCONTINUED] glipiZIDE (GLUCOTROL) 5 MG tablet TAKE 1 TABLET ($RemoveB'5mg'gLVutQyN$ ) BY MOUTH EVERY DAY BEFORE BREAKFAST.  . [DISCONTINUED] sildenafil (VIAGRA) 50 MG tablet 1-2 tabs 1 hour prior to intercourse (Patient not taking: Reported on 01/10/2020)   No facility-administered encounter medications on file as of 04/28/2020.     Assessment and Plan: Diabetes Patient currently takes glipizide and is managing his diabetes very  well. Last A1c was at goal of <7% on 11/07/19 6.6%. Patient checks his BG 2-3 times per week and it usually is 100-115. Patient expressed occasional episodes of hypoglycemia (~2x/wk) but feels better after having some gatorade. Patient eats relatively healthy but does occasionally have some sweet snacks and pasta. Encouraged patient to continue his healthy eating habits and try to minimize the sweet drinks. He states he is trying to stop drinking soda which he has substituted with gatorade. Explained to patient that gatorade also has a lot of sugar and that he could try the gatorade zero version. Ultimately explained to patient to minimize the sugary drinks and to have more water. The patient does not exercise however he is walking around/on his feet all day at work and averages 5000-7000 steps per day. He also uses his fitbit to track his calories and explained that he aims  to burn 1000 calories per day. He plans to go to the gym soon but will continue his exercise/diet in the meantime.   CAD/HTN Patient takes losartan, metoprolol, aspirin and clopidogrel. Patient did not express any concerns/ADRs with his antiplatelet regimen. He checks his BP 1-2 times per week or when he feels lightheaded/dizzy. He states his BP normally runs 140/70s. Encouraged patient to continue diet and exercise to help get his BP to goal (mainly his systolic). Also encouraged patient to cut down/quit smoking. He has not increased his smoking use since last visit, however would benefit from quitting. Patient stated he will keep trying to taper off and eventually quit.   Depression/anxiety Patient takes quetiapine, trazodone, and clonazepam. He did not express any concerns for his mood or anxiety.  Lower back pain and R hip pain Patient did express he has new lower back pain and right hip pain. States that tylenol and stretching help with the pain. Encouraged patient to continue home remedies and to reach out to PCP if pain worsens or  does not go away.   Adherence Patient uses a pill box to remain compliant/adherent to medications. He does state he is low on quetiapine (~2 days supply left) and is requesting a refill.    Sherilyn Banker, PharmD Pharmacy Resident  04/29/2020 10:32 AM

## 2020-05-07 ENCOUNTER — Other Ambulatory Visit: Payer: Self-pay | Admitting: Gerontology

## 2020-05-07 DIAGNOSIS — I10 Essential (primary) hypertension: Secondary | ICD-10-CM

## 2020-05-16 ENCOUNTER — Other Ambulatory Visit: Payer: Self-pay

## 2020-05-22 ENCOUNTER — Other Ambulatory Visit: Payer: Self-pay | Admitting: Gerontology

## 2020-05-22 DIAGNOSIS — I10 Essential (primary) hypertension: Secondary | ICD-10-CM

## 2020-05-22 DIAGNOSIS — E785 Hyperlipidemia, unspecified: Secondary | ICD-10-CM

## 2020-05-22 MED ORDER — LOSARTAN POTASSIUM 50 MG PO TABS: 50.0000 mg | ORAL_TABLET | Freq: Every day | ORAL | 3 refills | Status: DC

## 2020-05-22 MED ORDER — ROSUVASTATIN CALCIUM 20 MG PO TABS: 20.0000 mg | ORAL_TABLET | Freq: Every day | ORAL | 2 refills | Status: DC

## 2020-05-27 ENCOUNTER — Other Ambulatory Visit: Payer: Self-pay | Admitting: Internal Medicine

## 2020-06-25 ENCOUNTER — Other Ambulatory Visit: Payer: Medicaid Other

## 2020-06-25 ENCOUNTER — Other Ambulatory Visit: Payer: Self-pay

## 2020-06-25 VITALS — BP 112/75 | HR 86 | Temp 97.3°F | Ht 68.0 in | Wt 212.9 lb

## 2020-06-25 DIAGNOSIS — E119 Type 2 diabetes mellitus without complications: Secondary | ICD-10-CM

## 2020-06-26 LAB — HEMOGLOBIN A1C
Est. average glucose Bld gHb Est-mCnc: 226 mg/dL
Hgb A1c MFr Bld: 9.5 % — ABNORMAL HIGH (ref 4.8–5.6)

## 2020-06-26 LAB — MICROALBUMIN / CREATININE URINE RATIO
Creatinine, Urine: 220.9 mg/dL
Microalb/Creat Ratio: 3 mg/g creat (ref 0–29)
Microalbumin, Urine: 5.8 ug/mL

## 2020-07-03 ENCOUNTER — Ambulatory Visit: Payer: Self-pay | Admitting: Gerontology

## 2020-07-08 NOTE — Telephone Encounter (Signed)
To close telephone encounter 

## 2020-07-16 ENCOUNTER — Other Ambulatory Visit: Payer: Self-pay

## 2020-07-16 ENCOUNTER — Other Ambulatory Visit: Payer: Self-pay | Admitting: Gerontology

## 2020-07-16 ENCOUNTER — Ambulatory Visit: Payer: Medicaid Other | Admitting: Gerontology

## 2020-07-16 VITALS — BP 148/91 | HR 65 | Temp 98.3°F | Resp 16 | Wt 213.8 lb

## 2020-07-16 DIAGNOSIS — E119 Type 2 diabetes mellitus without complications: Secondary | ICD-10-CM

## 2020-07-16 LAB — GLUCOSE, POCT (MANUAL RESULT ENTRY): POC Glucose: 80 mg/dl (ref 70–99)

## 2020-07-16 MED ORDER — GLIPIZIDE 10 MG PO TABS: 10.0000 mg | ORAL_TABLET | Freq: Every day | ORAL | 2 refills | Status: DC

## 2020-07-16 MED ORDER — METFORMIN HCL 1000 MG PO TABS: 1000.0000 mg | ORAL_TABLET | Freq: Two times a day (BID) | ORAL | 2 refills | Status: DC

## 2020-07-16 NOTE — Patient Instructions (Signed)

## 2020-07-16 NOTE — Progress Notes (Signed)
Established Patient Office Visit  Subjective:  Patient ID: Larry French, male    DOB: 1960/11/10  Age: 60 y.o. MRN: [id number]  CC: No chief complaint on file.   HPI [name] presents for follow up of Type 2 diabetes and lab review. His HgbA1c done on 06/25/20 was 9.5%. He reports that his HgbA1c done at Dr Jeralyn Bennett clinic on 05/20/20 was 12.1%. He was started on Metformin  500 mg bid.He tolerates medication, reports that he does not check his blood glucose, denies hypoglycemia and peripheral neuropathy. His blood glucose was checked during visit and it was 80 mg/dl.  He also fell down his porch which resulted in right sided injury and was seen by Dr Ginette Pitman on 05/27/20 and was started on Tramadol 50 mg tid. Currently he denies any right sided pain. Overall, he states that he's doing well and offers no further complaint.    Past Medical History:  Diagnosis Date  . Alcohol abuse    Recovered x 15 months  . Anxiety   . CA - skin cancer   . CAD, residual CFX LAD disease 06/01/2011   a. s/p inferior ST elevation MI s/p PCI/DES to RCA on 05/29/2011; b. residual LAD and LCx CAD tx'd on 06/02/11 cath with LAD CAD successfully tx'd w/ PCI/DES, LCx managaged medically   . Depression   . Headache(784.0)   . Hyperlipemia   . Hypertension   . Insomnia   . Mental disorder   . MI (myocardial infarction) ([place])   . Polysubstance abuse ([place])    a. etoh, xanax, tobacco  . PTSD (post-traumatic stress disorder)     Past Surgical History:  Procedure Laterality Date  . CARDIAC CATHETERIZATION Bilateral 04/09/2015   Procedure: Coronary Angiogram;  Surgeon: Wellington Hampshire, MD;  Location: [place] [facility];  Service: Cardiovascular;  Laterality: Bilateral;  . CARDIAC CATHETERIZATION [name] 04/09/2015   Procedure: Coronary Stent Intervention;  Surgeon: Wellington Hampshire, MD;  Location: [place] [facility];  Service: Cardiovascular;  Laterality: [name];  . COLONOSCOPY WITH PROPOFOL [name]  03/19/2019   Procedure: COLONOSCOPY WITH PROPOFOL;  Surgeon: Lin Landsman, MD;  Location: [facility] ENDOSCOPY;  Service: Gastroenterology;  Laterality: [name];  . LEFT HEART CATH Right 05/29/2011   Procedure: LEFT HEART CATH;  Surgeon: Leonie Man, MD;  Location: [facility] CATH LAB;  Service: Cardiovascular;  Laterality: Right;  . LEFT HEART CATHETERIZATION WITH CORONARY ANGIOGRAM [name] 06/01/2011   Procedure: LEFT HEART CATHETERIZATION WITH CORONARY ANGIOGRAM;  Surgeon: Leonie Man, MD;  Location: [facility] CATH LAB;  Service: Cardiovascular;  Laterality: [name];  relook cath, possible PCI  . TONSILLECTOMY AND ADENOIDECTOMY      Family History  Problem Relation Age of Onset  . Cancer Mother        Non-Hodgkin lymphoma  . Depression Mother   . Anxiety disorder Mother   . Depression Father   . Anxiety disorder Father   . Coronary artery disease Maternal Grandfather 46       Died    Social History   Socioeconomic History  . Marital status: Single    Spouse name: Not on file  . Number of children: 0  . Years of education: Not on file  . Highest education level: Not on file  Occupational History  . Occupation: Electrical engineer: [employer]  Tobacco Use  . Smoking status: Current Every Day Smoker    Packs/day: 1.00    Years: 30.00  Pack years: 30.00    Types: Cigarettes  . Smokeless tobacco: Never Used  Vaping Use  . Vaping Use: Never used  Substance and Sexual Activity  . Alcohol use: No    Comment: Previous user (sober x 5 years)  . Drug use: No  . Sexual activity: Not Currently  Other Topics Concern  . Not on file  Social History Narrative  . Not on file   Social Determinants of Health   Financial Resource Strain: Not on file  Food Insecurity: Not on file  Transportation Needs: Not on file  Physical Activity: Not on file  Stress: Not on file  Social Connections: Not on file  Intimate Partner Violence: Not on file    Outpatient Medications Prior to Visit   Medication Sig Dispense Refill  . aspirin 81 MG EC tablet TAKE ONE TABLET BY MOUTH EVERY DAY 90 tablet 0  . clonazePAM (KLONOPIN) 1 MG tablet Take 1 mg by mouth 2 (two) times daily as needed for anxiety.    . gabapentin (NEURONTIN) 600 MG tablet Take 600 mg by mouth 2 (two) times daily.    . QUEtiapine (SEROQUEL) 200 MG tablet Take 400 mg by mouth at bedtime.    . traZODone (DESYREL) 100 MG tablet 1-2 tablets PO HS    . clopidogrel (PLAVIX) 75 MG tablet Take 1 tablet (75 mg total) by mouth daily. 90 tablet 1  . metFORMIN (GLUCOPHAGE) 500 MG tablet Take 500 mg by mouth 2 (two) times daily with a meal.    . metoprolol tartrate (LOPRESSOR) 25 MG tablet TAKE ONE TABLET BY MOUTH 2 TIMES A DAY 180 tablet 0  . rosuvastatin (CRESTOR) 20 MG tablet Take 1 tablet (20 mg total) by mouth daily. 30 tablet 2  . blood glucose meter kit and supplies KIT Dispense based on patient and insurance preference. Use up to four times daily as directed. (FOR ICD-9 250.00, 250.01). 1 each 0  . glipiZIDE (GLUCOTROL) 5 MG tablet TAKE 1 TABLET (5mg) BY MOUTH EVERY DAY BEFORE BREAKFAST. 90 tablet 0  . losartan (COZAAR) 50 MG tablet Take 1 tablet (50 mg total) by mouth daily. 30 tablet 3   No facility-administered medications prior to visit.    No Known Allergies  ROS Review of Systems  Constitutional: Negative.   Eyes: Negative.   Respiratory: Negative.   Cardiovascular: Negative.   Endocrine: Negative.   Skin: Negative.   Neurological: Negative.   Psychiatric/Behavioral: Negative.       Objective:    Physical Exam HENT:     Head: Normocephalic and atraumatic.     Mouth/Throat:     Mouth: Mucous membranes are moist.  Eyes:     Extraocular Movements: Extraocular movements intact.     Conjunctiva/sclera: Conjunctivae normal.     Pupils: Pupils are equal, round, and reactive to light.  Cardiovascular:     Rate and Rhythm: Normal rate and regular rhythm.     Pulses: Normal pulses.     Heart sounds:  Normal heart sounds.  Pulmonary:     Effort: Pulmonary effort is normal.     Breath sounds: Normal breath sounds.  Skin:    General: Skin is warm.  Neurological:     General: No focal deficit present.     Mental Status: He is alert and oriented to person, place, and time. Mental status is at baseline.  Psychiatric:        Mood and Affect: Mood normal.          Behavior: Behavior normal.        Thought Content: Thought content normal.        Judgment: Judgment normal.     BP (!) 148/91 (BP Location: Left Arm, Patient Position: Sitting, Cuff Size: Large)   Pulse 65   Temp 98.3 F (36.8 C)   Resp 16   Wt 213 lb 12.8 oz (97 kg)   SpO2 97%   BMI 32.51 kg/m  Wt Readings from Last 3 Encounters:  07/16/20 213 lb 12.8 oz (97 kg)  06/25/20 212 lb 14.4 oz (96.6 kg)  01/10/20 227 lb 14.4 oz (103.4 kg)   Weight loss was encouraged.  Health Maintenance Due  Topic Date Due  . Hepatitis C Screening  Never done  . HIV Screening  Never done  . TETANUS/TDAP  Never done  . OPHTHALMOLOGY EXAM  11/25/2017  . COVID-19 Vaccine (2 - Moderna risk 4-dose series) 08/16/2019  . FOOT EXAM  02/28/2020    There are no preventive care reminders to display for this patient.  Lab Results  Component Value Date   TSH 2.790 03/02/2018   Lab Results  Component Value Date   WBC 10.3 03/02/2018   HGB 17.7 03/02/2018   HCT 50.2 03/02/2018   MCV 86 03/02/2018   PLT 254 03/02/2018   Lab Results  Component Value Date   NA 142 02/21/2019   K 4.0 02/21/2019   CO2 23 02/21/2019   GLUCOSE 144 (H) 02/21/2019   BUN 12 02/21/2019   CREATININE 1.03 02/21/2019   BILITOT 0.2 02/21/2019   ALKPHOS 88 02/21/2019   AST 23 02/21/2019   ALT 32 02/21/2019   PROT 6.4 02/21/2019   ALBUMIN 4.5 02/21/2019   CALCIUM 9.3 02/21/2019   ANIONGAP 14 06/18/2015   Lab Results  Component Value Date   CHOL 129 12/17/2019   Lab Results  Component Value Date   HDL 34 (L) 12/17/2019   Lab Results  Component Value  Date   LDLCALC 72 12/17/2019   Lab Results  Component Value Date   TRIG 127 12/17/2019   Lab Results  Component Value Date   CHOLHDL 3.8 12/17/2019   Lab Results  Component Value Date   HGBA1C 9.5 (H) 06/25/2020      Assessment & Plan:   1. Diabetes mellitus without complication (HCC) - His HgbA1c was 9.5%, his goal should be less than 7%. He was encouraged to check his blood glucose bid, record and bring log to appointment, his fasting goal should be between 80-130 mg/dl. He was encouraged to continue on low carb/ non concentrated sweet diet and exercise as tolerated. - HgB A1c; Future - Ambulatory referral to Ophthalmology - POCT Glucose (CBG); Future - POCT Glucose (CBG)    Follow-up: Return in about 11 weeks (around 10/01/2020), or if symptoms worsen or fail to improve.     E , NP 

## 2020-07-20 MED FILL — Trazodone HCl Tab 100 MG: ORAL | 30 days supply | Qty: 60 | Fill #0 | Status: AC

## 2020-07-20 MED FILL — Quetiapine Fumarate Tab 400 MG: ORAL | 30 days supply | Qty: 30 | Fill #0 | Status: AC

## 2020-07-21 ENCOUNTER — Other Ambulatory Visit: Payer: Self-pay

## 2020-07-22 ENCOUNTER — Other Ambulatory Visit: Payer: Self-pay

## 2020-07-23 ENCOUNTER — Other Ambulatory Visit: Payer: Self-pay

## 2020-07-24 ENCOUNTER — Other Ambulatory Visit: Payer: Self-pay

## 2020-08-13 ENCOUNTER — Other Ambulatory Visit: Payer: Self-pay

## 2020-08-13 MED ORDER — QUETIAPINE FUMARATE 400 MG PO TABS
ORAL_TABLET | Freq: Every day | ORAL | 1 refills | Status: DC
  Filled 2020-08-13 (×2): qty 30, 30d supply, fill #0
  Filled 2020-09-03: qty 30, 30d supply, fill #1
  Filled ????-??-??: fill #0

## 2020-08-13 MED ORDER — QUETIAPINE FUMARATE 400 MG PO TABS: ORAL_TABLET | ORAL | 0 refills | Status: DC

## 2020-08-25 ENCOUNTER — Other Ambulatory Visit: Payer: Self-pay

## 2020-08-25 MED ORDER — METFORMIN HCL 500 MG PO TABS
500.0000 mg | ORAL_TABLET | Freq: Every day | ORAL | 1 refills | Status: DC
  Filled 2020-08-25: qty 30, 30d supply, fill #0
  Filled 2020-10-05: qty 30, 30d supply, fill #1
  Filled 2020-11-07: qty 30, 30d supply, fill #2

## 2020-08-25 MED ORDER — GLIPIZIDE 5 MG PO TABS
5.0000 mg | ORAL_TABLET | ORAL | 1 refills | Status: DC
  Filled 2020-08-25: qty 30, 30d supply, fill #0
  Filled 2020-10-05: qty 30, 30d supply, fill #1
  Filled 2020-11-07: qty 30, 30d supply, fill #2

## 2020-09-02 ENCOUNTER — Other Ambulatory Visit: Payer: Self-pay

## 2020-09-02 MED FILL — Losartan Potassium Tab 50 MG: ORAL | 30 days supply | Qty: 30 | Fill #0 | Status: AC

## 2020-09-02 MED FILL — Trazodone HCl Tab 100 MG: ORAL | 30 days supply | Qty: 60 | Fill #1 | Status: AC

## 2020-09-03 ENCOUNTER — Other Ambulatory Visit: Payer: Self-pay

## 2020-09-03 ENCOUNTER — Other Ambulatory Visit: Payer: Self-pay | Admitting: Gerontology

## 2020-09-03 DIAGNOSIS — I10 Essential (primary) hypertension: Secondary | ICD-10-CM

## 2020-09-03 MED ORDER — METOPROLOL TARTRATE 25 MG PO TABS
ORAL_TABLET | Freq: Two times a day (BID) | ORAL | 1 refills | Status: DC
  Filled 2020-09-03: qty 60, 30d supply, fill #0
  Filled 2020-10-05: qty 60, 30d supply, fill #1
  Filled 2020-11-07: qty 60, 30d supply, fill #2
  Filled 2020-12-16: qty 60, 30d supply, fill #3

## 2020-09-04 ENCOUNTER — Other Ambulatory Visit: Payer: Self-pay

## 2020-09-04 MED ORDER — QUETIAPINE FUMARATE 400 MG PO TABS
400.0000 mg | ORAL_TABLET | Freq: Every evening | ORAL | 1 refills | Status: DC
  Filled 2020-09-04: qty 30, 30d supply, fill #0
  Filled 2020-10-05: qty 30, 30d supply, fill #1
  Filled 2020-11-07: qty 30, 30d supply, fill #2
  Filled 2020-12-03: qty 30, 30d supply, fill #3
  Filled 2020-12-30: qty 30, 30d supply, fill #4
  Filled 2021-01-26: qty 30, 30d supply, fill #5

## 2020-09-04 MED ORDER — QUETIAPINE FUMARATE 200 MG PO TABS
ORAL_TABLET | ORAL | 1 refills | Status: DC
  Filled 2020-09-04: qty 30, 30d supply, fill #0
  Filled 2020-10-05: qty 30, 30d supply, fill #1
  Filled 2020-11-07: qty 30, 30d supply, fill #2
  Filled 2020-12-03: qty 30, 30d supply, fill #3
  Filled 2020-12-30: qty 30, 30d supply, fill #4
  Filled 2021-01-26: qty 30, 30d supply, fill #5

## 2020-09-24 ENCOUNTER — Other Ambulatory Visit: Payer: Self-pay

## 2020-09-24 ENCOUNTER — Other Ambulatory Visit: Payer: Medicaid Other

## 2020-09-24 DIAGNOSIS — E119 Type 2 diabetes mellitus without complications: Secondary | ICD-10-CM

## 2020-09-25 LAB — HEMOGLOBIN A1C
Est. average glucose Bld gHb Est-mCnc: 134 mg/dL
Hgb A1c MFr Bld: 6.3 % — ABNORMAL HIGH (ref 4.8–5.6)

## 2020-09-25 LAB — MICROALBUMIN / CREATININE URINE RATIO
Creatinine, Urine: 142.9 mg/dL
Microalb/Creat Ratio: <2 mg/g creat (ref 0–29)
Microalbumin, Urine: <3 ug/mL

## 2020-10-01 ENCOUNTER — Other Ambulatory Visit: Payer: Self-pay

## 2020-10-01 ENCOUNTER — Ambulatory Visit: Payer: Medicaid Other | Admitting: Gerontology

## 2020-10-01 ENCOUNTER — Encounter: Payer: Self-pay | Admitting: Gerontology

## 2020-10-01 VITALS — BP 146/87 | HR 76 | Temp 98.6°F | Resp 16 | Ht 68.0 in | Wt 221.9 lb

## 2020-10-01 DIAGNOSIS — E119 Type 2 diabetes mellitus without complications: Secondary | ICD-10-CM

## 2020-10-01 DIAGNOSIS — E785 Hyperlipidemia, unspecified: Secondary | ICD-10-CM

## 2020-10-01 DIAGNOSIS — I10 Essential (primary) hypertension: Secondary | ICD-10-CM

## 2020-10-01 MED ORDER — ROSUVASTATIN CALCIUM 20 MG PO TABS
ORAL_TABLET | Freq: Every day | ORAL | 2 refills | Status: DC
  Filled 2020-10-01: qty 30, 30d supply, fill #0
  Filled 2020-10-08: qty 90, 90d supply, fill #0
  Filled 2020-10-08: qty 30, 30d supply, fill #0
  Filled 2020-12-03: qty 30, 30d supply, fill #1
  Filled 2020-12-17 – 2020-12-30 (×2): qty 30, 30d supply, fill #2

## 2020-10-01 MED ORDER — LOSARTAN POTASSIUM 50 MG PO TABS
75.0000 mg | ORAL_TABLET | Freq: Every day | ORAL | 3 refills | Status: DC
  Filled 2020-10-01: qty 45, 30d supply, fill #0

## 2020-10-01 MED ORDER — CLOPIDOGREL BISULFATE 75 MG PO TABS
ORAL_TABLET | Freq: Every day | ORAL | 1 refills | Status: DC
  Filled 2020-10-01: qty 30, 30d supply, fill #0
  Filled 2021-01-18: qty 30, 30d supply, fill #1
  Filled 2021-03-05: qty 30, 30d supply, fill #2
  Filled 2021-04-19: qty 90, 90d supply, fill #3

## 2020-10-01 NOTE — Progress Notes (Signed)
Established Patient Office Visit  Subjective:  Patient ID: Larry French, male    DOB: Oct 17, 1960  Age: 60 y.o. MRN: [id number]  CC:  Chief Complaint  Patient presents with   Follow-up    Labs done 09/24/20   Diabetes     HPI Larry French is a 60 y/o male with PMH of Alcohol abuse, T2DM, Anxiety, CA-skin cancer, CAD, Depression, Hyperlipidemia, Hypertension, presents for follow up , lab review and medication refill. His HgbA1c done on 09/24/20 decreased from 9.5% to 6.3%. He states that he's compliant with his medications and adheres to ADA diet and exercises as tolerated. He doesn't check his blood glucose, denies hypo/hyperglycemic symptoms, peripheral neuropathy and performs daily foot checks. His blood glucose was 166 mg/dl during visit. His blood pressure was elevated during visit, he denies chest pain, palpitation, dizziness and no vision changes. His lab done at [facility] on 5 2/22 were unremarkable. Overall, he states that he's doing well and offers no further complaint.    Past Medical History:  Diagnosis Date   Alcohol abuse    Recovered x 15 months   Anxiety    CA - skin cancer    CAD, residual CFX LAD disease 06/01/2011   a. s/p inferior ST elevation MI s/p PCI/DES to RCA on 05/29/2011; b. residual LAD and LCx CAD tx'd on 06/02/11 cath with LAD CAD successfully tx'd w/ PCI/DES, LCx managaged medically    Depression    Headache(784.0)    Hyperlipemia    Hypertension    Insomnia    Mental disorder    MI (myocardial infarction) ([place])    Polysubstance abuse ([place])    a. etoh, xanax, tobacco   PTSD (post-traumatic stress disorder)     Past Surgical History:  Procedure Laterality Date   CARDIAC CATHETERIZATION Bilateral 04/09/2015   Procedure: Coronary Angiogram;  Surgeon: Wellington Hampshire, MD;  Location: [place] [facility];  Service: Cardiovascular;  Laterality: Bilateral;   CARDIAC CATHETERIZATION [name] 04/09/2015   Procedure: Coronary Stent  Intervention;  Surgeon: Wellington Hampshire, MD;  Location: [place] [facility];  Service: Cardiovascular;  Laterality: [name];   COLONOSCOPY WITH PROPOFOL [name] 03/19/2019   Procedure: COLONOSCOPY WITH PROPOFOL;  Surgeon: Lin Landsman, MD;  Location: [facility] ENDOSCOPY;  Service: Gastroenterology;  Laterality: [name];   LEFT HEART CATH Right 05/29/2011   Procedure: LEFT HEART CATH;  Surgeon: Leonie Man, MD;  Location: [facility] CATH LAB;  Service: Cardiovascular;  Laterality: Right;   LEFT HEART CATHETERIZATION WITH CORONARY ANGIOGRAM [name] 06/01/2011   Procedure: LEFT HEART CATHETERIZATION WITH CORONARY ANGIOGRAM;  Surgeon: Leonie Man, MD;  Location: [facility] CATH LAB;  Service: Cardiovascular;  Laterality: [name];  relook cath, possible PCI   TONSILLECTOMY AND ADENOIDECTOMY      Family History  Problem Relation Age of Onset   Cancer Mother        Non-Hodgkin lymphoma   Depression Mother    Anxiety disorder Mother    Depression Father    Anxiety disorder Father    Other Brother        MVA   Coronary artery disease Maternal Grandfather 5       Died    Social History   Socioeconomic History   Marital status: Single    Spouse name: Not on file   Number of children: 0   Years of education: Not on file   Highest education level: Not on file  Occupational History   Occupation: Therapist, art  Employer: [employer]  Tobacco Use   Smoking status: Every Day    Packs/day: 1.00    Years: 30.00    Pack years: 30.00    Types: Cigarettes   Smokeless tobacco: Never  Vaping Use   Vaping Use: Never used  Substance and Sexual Activity   Alcohol use: No    Comment: Previous user (sober x 5 years)   Drug use: No   Sexual activity: Not Currently  Other Topics Concern   Not on file  Social History Narrative   Not on file   Social Determinants of Health   Financial Resource Strain: Not on file  Food Insecurity: No Food Insecurity   Worried About Running Out of Food in the Last Year: Never  true   Ran Out of Food in the Last Year: Never true  Transportation Needs: No Transportation Needs   Lack of Transportation (Medical): No   Lack of Transportation (Non-Medical): No  Physical Activity: Not on file  Stress: Not on file  Social Connections: Not on file  Intimate Partner Violence: Not on file    Outpatient Medications Prior to Visit  Medication Sig Dispense Refill   aspirin 81 MG EC tablet TAKE ONE TABLET BY MOUTH EVERY DAY 90 tablet 0   blood glucose meter kit and supplies KIT Dispense based on patient and insurance preference. Use up to four times daily as directed. (FOR ICD-9 250.00, 250.01). 1 each 0   clonazePAM (KLONOPIN) 1 MG tablet Take 1 mg by mouth 2 (two) times daily as needed for anxiety.     glipiZIDE (GLUCOTROL) 5 MG tablet Take 1 tablet (5 mg total) by mouth every morning before breakfast. 90 tablet 1   metFORMIN (GLUCOPHAGE) 500 MG tablet Take 1 tablet (500 mg total) by mouth daily with breakfast. 90 tablet 1   metoprolol tartrate (LOPRESSOR) 25 MG tablet TAKE ONE TABLET BY MOUTH 2 TIMES A DAY 180 tablet 1   QUEtiapine (SEROQUEL) 200 MG tablet Take one tablet ($RemoveBef'200mg'iuPXCMrqQy$  total) by mouth once nightly as needed in addition to the $Remo'400mg'rJYxH$  90 tablet 1   QUEtiapine (SEROQUEL) 400 MG tablet Take 1 tablet (400 mg total) by mouth once nightly. 90 tablet 1   traZODone (DESYREL) 100 MG tablet TAKE 1 OR 2 TABLETS BY MOUTH AT BEDTIME 180 tablet 1   clopidogrel (PLAVIX) 75 MG tablet TAKE ONE TABLET BY MOUTH EVERY DAY 90 tablet 1   losartan (COZAAR) 50 MG tablet TAKE ONE TABLET BY MOUTH EVERY DAY 30 tablet 3   rosuvastatin (CRESTOR) 20 MG tablet TAKE ONE TABLET BY MOUTH EVERY DAY 30 tablet 2   traMADol (ULTRAM) 50 MG tablet Take 100 mg by mouth daily as needed.     gabapentin (NEURONTIN) 600 MG tablet Take 600 mg by mouth 2 (two) times daily.     gabapentin (NEURONTIN) 600 MG tablet TAKE ONE TABLET BY MOUTH 2 TIMES A DAY 180 tablet 1   glipiZIDE (GLUCOTROL) 10 MG tablet TAKE ONE  TABLET BY MOUTH EVERY DAY WITH BREAKFAST 30 tablet 2   metFORMIN (GLUCOPHAGE) 1000 MG tablet TAKE ONE TABLET BY MOUTH 2 TIMES A DAY WITH MEALS 60 tablet 2   QUEtiapine (SEROQUEL) 200 MG tablet Take 400 mg by mouth at bedtime.     QUEtiapine (SEROQUEL) 400 MG tablet TAKE 1 TABLET BY MOUTH AT BEDTIME 30 tablet 1   QUEtiapine (SEROQUEL) 400 MG tablet Take 1 tablet (400 mg total) by mouth nightly 30 tablet 0   traZODone (DESYREL)  100 MG tablet 1-2 tablets PO HS     No facility-administered medications prior to visit.    No Known Allergies  ROS Review of Systems  Constitutional: Negative.   Eyes: Negative.   Respiratory: Negative.    Cardiovascular: Negative.   Endocrine: Negative.   Skin: Negative.   Allergic/Immunologic: Negative.   Neurological: Negative.   Hematological: Negative.   Psychiatric/Behavioral: Negative.       Objective:    Physical Exam HENT:     Head: Normocephalic and atraumatic.     Mouth/Throat:     Mouth: Mucous membranes are moist.  Eyes:     Extraocular Movements: Extraocular movements intact.     Conjunctiva/sclera: Conjunctivae normal.     Pupils: Pupils are equal, round, and reactive to light.  Cardiovascular:     Rate and Rhythm: Normal rate and regular rhythm.     Pulses: Normal pulses.     Heart sounds: Normal heart sounds.  Pulmonary:     Effort: Pulmonary effort is normal.     Breath sounds: Normal breath sounds.  Skin:    General: Skin is warm.  Neurological:     General: No focal deficit present.     Mental Status: He is alert and oriented to person, place, and time. Mental status is at baseline.  Psychiatric:        Mood and Affect: Mood normal.        Behavior: Behavior normal.        Thought Content: Thought content normal.        Judgment: Judgment normal.    BP (!) 146/87 (BP Location: Right Arm, Patient Position: Sitting, Cuff Size: Large)   Pulse 76   Temp 98.6 F (37 C)   Resp 16   Ht $R'5\' 8"'lL$  (1.727 m)   Wt 221 lb 14.4  oz (100.7 kg)   SpO2 95%   BMI 33.74 kg/m  Wt Readings from Last 3 Encounters:  10/01/20 221 lb 14.4 oz (100.7 kg)  09/24/20 219 lb 6.4 oz (99.5 kg)  07/16/20 213 lb 12.8 oz (97 kg)   Weight loss was encouraged.  Health Maintenance Due  Topic Date Due   Pneumococcal Vaccine 63-70 Years old (1 - PCV) Never done   HIV Screening  Never done   Hepatitis C Screening  Never done   TETANUS/TDAP  Never done   Zoster Vaccines- Shingrix (1 of 2) Never done   OPHTHALMOLOGY EXAM  11/25/2017   COVID-19 Vaccine (2 - Moderna risk series) 08/16/2019    There are no preventive care reminders to display for this patient.  Lab Results  Component Value Date   TSH 2.790 03/02/2018   Lab Results  Component Value Date   WBC 10.3 03/02/2018   HGB 17.7 03/02/2018   HCT 50.2 03/02/2018   MCV 86 03/02/2018   PLT 254 03/02/2018   Lab Results  Component Value Date   NA 142 02/21/2019   K 4.0 02/21/2019   CO2 23 02/21/2019   GLUCOSE 144 (H) 02/21/2019   BUN 12 02/21/2019   CREATININE 1.03 02/21/2019   BILITOT 0.2 02/21/2019   ALKPHOS 88 02/21/2019   AST 23 02/21/2019   ALT 32 02/21/2019   PROT 6.4 02/21/2019   ALBUMIN 4.5 02/21/2019   CALCIUM 9.3 02/21/2019   ANIONGAP 14 06/18/2015   Lab Results  Component Value Date   CHOL 129 12/17/2019   Lab Results  Component Value Date   HDL 34 (L) 12/17/2019   Lab  Results  Component Value Date   LDLCALC 72 12/17/2019   Lab Results  Component Value Date   TRIG 127 12/17/2019   Lab Results  Component Value Date   CHOLHDL 3.8 12/17/2019   Lab Results  Component Value Date   HGBA1C 6.3 (H) 09/24/2020      Assessment & Plan:   1. Essential hypertension - His blood pressure is not under control, his Losartan was increased to 75 mg daily. -Low salt DASH diet -Take medications regularly on time -Exercise regularly as tolerated -Check blood pressure at least once a week at home or a nearby pharmacy and record -Goal is less than  130/80 and normal blood pressure is 120/80 - clopidogrel (PLAVIX) 75 MG tablet; TAKE ONE TABLET BY MOUTH EVERY DAY  Dispense: 90 tablet; Refill: 1 - losartan (COZAAR) 50 MG tablet; Take 1.5 tablets (75 mg total) by mouth daily.  Dispense: 60 tablet; Refill: 3  2. Hyperlipidemia, unspecified hyperlipidemia type - His Lipid panel is under control, he will continue on current medication, low fat/cholesterol diet and exercise as tolerated. - rosuvastatin (CRESTOR) 20 MG tablet; TAKE ONE TABLET BY MOUTH EVERY DAY  Dispense: 30 tablet; Refill: 2  3. Diabetes mellitus without complication ([place]) -His XYOF1W has improved to 6.3%, he will continue on current medication regimen, low carb/non concentrated sweet diet and exercise as tolerated.    Follow-up: Return in about 29 days (around 10/30/2020), or if symptoms worsen or fail to improve.    Emily Massar Jerold Coombe, NP

## 2020-10-01 NOTE — Patient Instructions (Signed)
https://www.nhlbi.nih.gov/files/docs/public/heart/dash_brief.pdf">  DASH Eating Plan DASH stands for Dietary Approaches to Stop Hypertension. The DASH eating plan is a healthy eating plan that has been shown to: Reduce high blood pressure (hypertension). Reduce your risk for type 2 diabetes, heart disease, and stroke. Help with weight loss. What are tips for following this plan? Reading food labels Check food labels for the amount of salt (sodium) per serving. Choose foods with less than 5 percent of the Daily Value of sodium. Generally, foods with less than 300 milligrams (mg) of sodium per serving fit into this eating plan. To find whole grains, look for the word "whole" as the first word in the ingredient list. Shopping Buy products labeled as "low-sodium" or "no salt added." Buy fresh foods. Avoid canned foods and pre-made or frozen meals. Cooking Avoid adding salt when cooking. Use salt-free seasonings or herbs instead of table salt or sea salt. Check with your health care provider or pharmacist before using salt substitutes. Do not fry foods. Cook foods using healthy methods such as baking, boiling, grilling, roasting, and broiling instead. Cook with heart-healthy oils, such as olive, canola, avocado, soybean, or sunflower oil. Meal planning  Eat a balanced diet that includes: 4 or more servings of fruits and 4 or more servings of vegetables each day. Try to fill one-half of your plate with fruits and vegetables. 6-8 servings of whole grains each day. Less than 6 oz (170 g) of lean meat, poultry, or fish each day. A 3-oz (85-g) serving of meat is about the same size as a deck of cards. One egg equals 1 oz (28 g). 2-3 servings of low-fat dairy each day. One serving is 1 cup (237 mL). 1 serving of nuts, seeds, or beans 5 times each week. 2-3 servings of heart-healthy fats. Healthy fats called omega-3 fatty acids are found in foods such as walnuts, flaxseeds, fortified milks, and eggs.  These fats are also found in cold-water fish, such as sardines, salmon, and mackerel. Limit how much you eat of: Canned or prepackaged foods. Food that is high in trans fat, such as some fried foods. Food that is high in saturated fat, such as fatty meat. Desserts and other sweets, sugary drinks, and other foods with added sugar. Full-fat dairy products. Do not salt foods before eating. Do not eat more than 4 egg yolks a week. Try to eat at least 2 vegetarian meals a week. Eat more home-cooked food and less restaurant, buffet, and fast food.  Lifestyle When eating at a restaurant, ask that your food be prepared with less salt or no salt, if possible. If you drink alcohol: Limit how much you use to: 0-1 drink a day for women who are not pregnant. 0-2 drinks a day for men. Be aware of how much alcohol is in your drink. In the [place] one drink equals one 12 oz bottle of beer (355 mL), one 5 oz glass of wine (148 mL), or one 1 oz glass of hard liquor (44 mL). General information Avoid eating more than 2,300 mg of salt a day. If you have hypertension, you may need to reduce your sodium intake to 1,500 mg a day. Work with your health care provider to maintain a healthy body weight or to lose weight. Ask what an ideal weight is for you. Get at least 30 minutes of exercise that causes your heart to beat faster (aerobic exercise) most days of the week. Activities may include walking, swimming, or biking. Work with your health care provider   or dietitian to adjust your eating plan to your individual calorie needs. What foods should I eat? Fruits All fresh, dried, or frozen fruit. Canned fruit in natural juice (without addedsugar). Vegetables Fresh or frozen vegetables (raw, steamed, roasted, or grilled). Low-sodium or reduced-sodium tomato and vegetable juice. Low-sodium or reduced-sodium tomatosauce and tomato paste. Low-sodium or reduced-sodium canned vegetables. Grains Whole-grain or  whole-wheat bread. Whole-grain or whole-wheat pasta. Brown rice. Oatmeal. Quinoa. Bulgur. Whole-grain and low-sodium cereals. Pita bread.Low-fat, low-sodium crackers. Whole-wheat flour tortillas. Meats and other proteins Skinless chicken or turkey. Ground chicken or turkey. Pork with fat trimmed off. Fish and seafood. Egg whites. Dried beans, peas, or lentils. Unsalted nuts, nut butters, and seeds. Unsalted canned beans. Lean cuts of beef with fat trimmed off. Low-sodium, lean precooked or cured meat, such as sausages or meatloaves. Dairy Low-fat (1%) or fat-free (skim) milk. Reduced-fat, low-fat, or fat-free cheeses. Nonfat, low-sodium ricotta or cottage cheese. Low-fat or nonfatyogurt. Low-fat, low-sodium cheese. Fats and oils Soft margarine without trans fats. Vegetable oil. Reduced-fat, low-fat, or light mayonnaise and salad dressings (reduced-sodium). Canola, safflower, olive, avocado, soybean, andsunflower oils. Avocado. Seasonings and condiments Herbs. Spices. Seasoning mixes without salt. Other foods Unsalted popcorn and pretzels. Fat-free sweets. The items listed above may not be a complete list of foods and beverages you can eat. Contact a dietitian for more information. What foods should I avoid? Fruits Canned fruit in a light or heavy syrup. Fried fruit. Fruit in cream or buttersauce. Vegetables Creamed or fried vegetables. Vegetables in a cheese sauce. Regular canned vegetables (not low-sodium or reduced-sodium). Regular canned tomato sauce and paste (not low-sodium or reduced-sodium). Regular tomato and vegetable juice(not low-sodium or reduced-sodium). Pickles. Olives. Grains Baked goods made with fat, such as croissants, muffins, or some breads. Drypasta or rice meal packs. Meats and other proteins Fatty cuts of meat. Ribs. Fried meat. Bacon. Bologna, salami, and other precooked or cured meats, such as sausages or meat loaves. Fat from the back of a pig (fatback). Bratwurst.  Salted nuts and seeds. Canned beans with added salt. Canned orsmoked fish. Whole eggs or egg yolks. Chicken or turkey with skin. Dairy Whole or 2% milk, cream, and half-and-half. Whole or full-fat cream cheese. Whole-fat or sweetened yogurt. Full-fat cheese. Nondairy creamers. Whippedtoppings. Processed cheese and cheese spreads. Fats and oils Butter. Stick margarine. Lard. Shortening. Ghee. Bacon fat. Tropical oils, suchas coconut, palm kernel, or palm oil. Seasonings and condiments Onion salt, garlic salt, seasoned salt, table salt, and sea salt. Worcestershire sauce. Tartar sauce. Barbecue sauce. Teriyaki sauce. Soy sauce, including reduced-sodium. Steak sauce. Canned and packaged gravies. Fish sauce. Oyster sauce. Cocktail sauce. Store-bought horseradish. Ketchup. Mustard. Meat flavorings and tenderizers. Bouillon cubes. Hot sauces. Pre-made or packaged marinades. Pre-made or packaged taco seasonings. Relishes. Regular saladdressings. Other foods Salted popcorn and pretzels. The items listed above may not be a complete list of foods and beverages you should avoid. Contact a dietitian for more information. Where to find more information National Heart, Lung, and Blood Institute: www.nhlbi.nih.gov American Heart Association: www.heart.org Academy of Nutrition and Dietetics: www.eatright.org National Kidney Foundation: www.kidney.org Summary The DASH eating plan is a healthy eating plan that has been shown to reduce high blood pressure (hypertension). It may also reduce your risk for type 2 diabetes, heart disease, and stroke. When on the DASH eating plan, aim to eat more fresh fruits and vegetables, whole grains, lean proteins, low-fat dairy, and heart-healthy fats. With the DASH eating plan, you should limit salt (sodium) intake to 2,300   mg a day. If you have hypertension, you may need to reduce your sodium intake to 1,500 mg a day. Work with your health care provider or dietitian to adjust  your eating plan to your individual calorie needs. This information is not intended to replace advice given to you by your health care provider. Make sure you discuss any questions you have with your healthcare provider. Document Revised: 03/09/2019 Document Reviewed: 03/09/2019 Elsevier Patient Education  2022 Elsevier Inc.  

## 2020-10-02 ENCOUNTER — Other Ambulatory Visit: Payer: Self-pay

## 2020-10-03 ENCOUNTER — Other Ambulatory Visit: Payer: Self-pay

## 2020-10-05 MED FILL — Trazodone HCl Tab 100 MG: ORAL | 30 days supply | Qty: 60 | Fill #2 | Status: AC

## 2020-10-06 ENCOUNTER — Other Ambulatory Visit: Payer: Self-pay

## 2020-10-07 ENCOUNTER — Other Ambulatory Visit: Payer: Self-pay

## 2020-10-08 ENCOUNTER — Other Ambulatory Visit: Payer: Self-pay

## 2020-10-22 ENCOUNTER — Other Ambulatory Visit: Payer: Medicaid Other

## 2020-10-22 ENCOUNTER — Telehealth: Payer: Self-pay | Admitting: Pharmacist

## 2020-10-22 ENCOUNTER — Other Ambulatory Visit: Payer: Self-pay

## 2020-10-22 VITALS — BP 128/79 | HR 68 | Temp 98.3°F | Ht 68.0 in | Wt 226.7 lb

## 2020-10-22 DIAGNOSIS — I10 Essential (primary) hypertension: Secondary | ICD-10-CM

## 2020-10-22 NOTE — Telephone Encounter (Signed)
Patient approved for medication assistance at [facility] until 07/18/21, as long as eligibility criteria continues to be met.   Vonda [facility] Administrative Assistant 

## 2020-10-22 NOTE — Progress Notes (Signed)
cmp

## 2020-10-23 LAB — COMPREHENSIVE METABOLIC PANEL
ALT: 18 IU/L (ref 0–44)
AST: 17 IU/L (ref 0–40)
Albumin/Globulin Ratio: 1.9 (ref 1.2–2.2)
Albumin: 4.2 g/dL (ref 3.8–4.9)
Alkaline Phosphatase: 98 IU/L (ref 44–121)
BUN/Creatinine Ratio: 5 — ABNORMAL LOW (ref 9–20)
BUN: 6 mg/dL (ref 6–24)
Bilirubin Total: <0.2 mg/dL (ref 0.0–1.2)
CO2: 20 mmol/L (ref 20–29)
Calcium: 8.8 mg/dL (ref 8.7–10.2)
Chloride: 106 mmol/L (ref 96–106)
Creatinine, Ser: 1.12 mg/dL (ref 0.76–1.27)
Globulin, Total: 2.2 g/dL (ref 1.5–4.5)
Glucose: 104 mg/dL — ABNORMAL HIGH (ref 65–99)
Potassium: 4.6 mmol/L (ref 3.5–5.2)
Sodium: 142 mmol/L (ref 134–144)
Total Protein: 6.4 g/dL (ref 6.0–8.5)
eGFR: 76 mL/min/{1.73_m2} (ref >59)

## 2020-10-24 ENCOUNTER — Other Ambulatory Visit: Payer: Self-pay

## 2020-10-30 ENCOUNTER — Other Ambulatory Visit: Payer: Self-pay

## 2020-10-30 ENCOUNTER — Encounter: Payer: Self-pay | Admitting: Gerontology

## 2020-10-30 ENCOUNTER — Ambulatory Visit: Payer: Medicaid Other | Admitting: Gerontology

## 2020-10-30 VITALS — BP 133/86 | HR 69 | Temp 97.3°F | Resp 18 | Ht 67.0 in | Wt 226.6 lb

## 2020-10-30 DIAGNOSIS — I10 Essential (primary) hypertension: Secondary | ICD-10-CM

## 2020-10-30 DIAGNOSIS — E119 Type 2 diabetes mellitus without complications: Secondary | ICD-10-CM

## 2020-10-30 NOTE — Progress Notes (Signed)
Established Patient Office Visit  Subjective:  Patient ID: Larry French, male    DOB: Nov 26, 1960  Age: 60 y.o. MRN: [id number]  CC:  Chief Complaint  Patient presents with   Follow-up   Diabetes     HPI Larry French is a 60 y/o male with PMH of Alcohol abuse, T2DM, Anxiety, CA-skin cancer, CAD, Depression, Hyperlipidemia, Hypertension, presents for follow up , lab review and medication refill. His HgbA1c done on 09/24/20 decreased from 9.5% to 6.3%. He states that he's compliant with his medications and adheres to ADA diet and exercises as tolerated. He doesn't check his blood glucose, denies hypo/hyperglycemic symptoms, peripheral neuropathy and performs daily foot checks. His Losartan was increased to 75 mg during his las visit, and his blood pressure was within normal limits.  Overall, he states that he is doing well and offers no further complaints.   Past Medical History:  Diagnosis Date   Alcohol abuse    Recovered x 15 months   Anxiety    CA - skin cancer    CAD, residual CFX LAD disease 06/01/2011   a. s/p inferior ST elevation MI s/p PCI/DES to RCA on 05/29/2011; b. residual LAD and LCx CAD tx'd on 06/02/11 cath with LAD CAD successfully tx'd w/ PCI/DES, LCx managaged medically    Depression    Headache(784.0)    Hyperlipemia    Hypertension    Insomnia    Mental disorder    MI (myocardial infarction) ([place])    Polysubstance abuse ([place])    a. etoh, xanax, tobacco   PTSD (post-traumatic stress disorder)     Past Surgical History:  Procedure Laterality Date   CARDIAC CATHETERIZATION Bilateral 04/09/2015   Procedure: Coronary Angiogram;  Surgeon: Wellington Hampshire, MD;  Location: [facility];  Service: Cardiovascular;  Laterality: Bilateral;   CARDIAC CATHETERIZATION [name] 04/09/2015   Procedure: Coronary Stent Intervention;  Surgeon: Wellington Hampshire, MD;  Location: [place] [facility];  Service: Cardiovascular;  Laterality: [name];   COLONOSCOPY WITH  PROPOFOL [name] 03/19/2019   Procedure: COLONOSCOPY WITH PROPOFOL;  Surgeon: Lin Landsman, MD;  Location[facility] ENDOSCOPY;  Service: Gastroenterology;  Laterality: [name];   LEFT HEART CATH Right 05/29/2011   Procedure: LEFT HEART CATH;  Surgeon: Leonie Man, MD;  Location: [facility] CATH LAB;  Service: Cardiovascular;  Laterality: Right;   LEFT HEART CATHETERIZATION WITH CORONARY ANGIOGRAM [name] 06/01/2011   Procedure: LEFT HEART CATHETERIZATION WITH CORONARY ANGIOGRAM;  Surgeon: Leonie Man, MD;  Location: [facility] CATH LAB;  Service: Cardiovascular;  Laterality: [name];  relook cath, possible PCI   TONSILLECTOMY AND ADENOIDECTOMY      Family History  Problem Relation Age of Onset   Cancer Mother        Non-Hodgkin lymphoma   Depression Mother    Anxiety disorder Mother    Depression Father    Anxiety disorder Father    Other Brother        MVA   Coronary artery disease Maternal Grandfather 77       Died    Social History   Socioeconomic History   Marital status: Single    Spouse name: Not on file   Number of children: 0   Years of education: Not on file   Highest education level: Not on file  Occupational History   Occupation: Personal Shopper    Employer: [employer]  Tobacco Use   Smoking status: Every Day    Packs/day: 1.00  Years: 30.00    Pack years: 30.00    Types: Cigarettes   Smokeless tobacco: Never  Vaping Use   Vaping Use: Never used  Substance and Sexual Activity   Alcohol use: No    Comment: Previous user (sober x 5 years)   Drug use: No   Sexual activity: Not Currently  Other Topics Concern   Not on file  Social History Narrative   Not on file   Social Determinants of Health   Financial Resource Strain: Not on file  Food Insecurity: No Food Insecurity   Worried About Running Out of Food in the Last Year: Never true   Ran Out of Food in the Last Year: Never true  Transportation Needs: No Transportation Needs   Lack of Transportation (Medical): No    Lack of Transportation (Non-Medical): No  Physical Activity: Not on file  Stress: Not on file  Social Connections: Not on file  Intimate Partner Violence: Not on file    Outpatient Medications Prior to Visit  Medication Sig Dispense Refill   aspirin 81 MG EC tablet TAKE ONE TABLET BY MOUTH EVERY DAY 90 tablet 0   blood glucose meter kit and supplies KIT Dispense based on patient and insurance preference. Use up to four times daily as directed. (FOR ICD-9 250.00, 250.01). 1 each 0   clonazePAM (KLONOPIN) 1 MG tablet Take 1 mg by mouth 2 (two) times daily as needed for anxiety.     clopidogrel (PLAVIX) 75 MG tablet TAKE ONE TABLET BY MOUTH EVERY DAY 90 tablet 1   glipiZIDE (GLUCOTROL) 5 MG tablet Take 1 tablet (5 mg total) by mouth every morning before breakfast. 90 tablet 1   losartan (COZAAR) 50 MG tablet Take 1 and (1/2) tablets (75 mg total) by mouth once daily. 60 tablet 3   metFORMIN (GLUCOPHAGE) 500 MG tablet Take 1 tablet (500 mg total) by mouth daily with breakfast. 90 tablet 1   metoprolol tartrate (LOPRESSOR) 25 MG tablet TAKE ONE TABLET BY MOUTH 2 TIMES A DAY 180 tablet 1   QUEtiapine (SEROQUEL) 200 MG tablet Take one tablet ($RemoveBef'200mg'pZRfgJpEzd$  total) by mouth once nightly as needed in addition to the $Remo'400mg'Gujwm$  90 tablet 1   QUEtiapine (SEROQUEL) 400 MG tablet Take 1 tablet (400 mg total) by mouth once nightly. 90 tablet 1   rosuvastatin (CRESTOR) 20 MG tablet TAKE ONE TABLET BY MOUTH EVERY DAY 30 tablet 2   traMADol (ULTRAM) 50 MG tablet Take 50 mg by mouth 3 (three) times daily as needed for moderate pain.     traZODone (DESYREL) 100 MG tablet TAKE 1 OR 2 TABLETS BY MOUTH AT BEDTIME 180 tablet 1   No facility-administered medications prior to visit.    No Known Allergies  ROS Review of Systems  Constitutional: Negative.   Eyes: Negative.   Respiratory: Negative.    Cardiovascular: Negative.   Endocrine: Negative.   Skin: Negative.   Allergic/Immunologic: Negative.   Neurological:  Negative.   Hematological: Negative.   Psychiatric/Behavioral: Negative.       Objective:    Physical Exam HENT:     Head: Normocephalic and atraumatic.     Mouth/Throat:     Mouth: Mucous membranes are moist.  Eyes:     Extraocular Movements: Extraocular movements intact.     Conjunctiva/sclera: Conjunctivae normal.     Pupils: Pupils are equal, round, and reactive to light.  Cardiovascular:     Rate and Rhythm: Normal rate and regular rhythm.  Pulses: Normal pulses.     Heart sounds: Normal heart sounds.  Pulmonary:     Effort: Pulmonary effort is normal.     Breath sounds: Normal breath sounds.  Skin:    General: Skin is warm.  Neurological:     General: No focal deficit present.     Mental Status: He is alert and oriented to person, place, and time. Mental status is at baseline.  Psychiatric:        Mood and Affect: Mood normal.        Behavior: Behavior normal.        Thought Content: Thought content normal.        Judgment: Judgment normal.    BP 133/86 (BP Location: Right Arm, Patient Position: Sitting, Cuff Size: Large)   Pulse 69   Temp (!) 97.3 F (36.3 C)   Resp 18   Ht $R'5\' 7"'Zl$  (1.702 m)   Wt 226 lb 9.6 oz (102.8 kg)   SpO2 95%   BMI 35.49 kg/m  Wt Readings from Last 3 Encounters:  10/30/20 226 lb 9.6 oz (102.8 kg)  10/22/20 226 lb 11.2 oz (102.8 kg)  10/01/20 221 lb 14.4 oz (100.7 kg)   Weight loss was encouraged.  Health Maintenance Due  Topic Date Due   HIV Screening  Never done   Hepatitis C Screening  Never done   TETANUS/TDAP  Never done   Zoster Vaccines- Shingrix (1 of 2) Never done   OPHTHALMOLOGY EXAM  11/25/2017   COVID-19 Vaccine (2 - Moderna risk series) 08/16/2019   Pneumococcal Vaccine 39-58 Years old (2 - PCV) 02/28/2020    There are no preventive care reminders to display for this patient.  Lab Results  Component Value Date   TSH 2.790 03/02/2018   Lab Results  Component Value Date   WBC 10.3 03/02/2018   HGB 17.7  03/02/2018   HCT 50.2 03/02/2018   MCV 86 03/02/2018   PLT 254 03/02/2018   Lab Results  Component Value Date   NA 142 10/22/2020   K 4.6 10/22/2020   CO2 20 10/22/2020   GLUCOSE 104 (H) 10/22/2020   BUN 6 10/22/2020   CREATININE 1.12 10/22/2020   BILITOT <0.2 10/22/2020   ALKPHOS 98 10/22/2020   AST 17 10/22/2020   ALT 18 10/22/2020   PROT 6.4 10/22/2020   ALBUMIN 4.2 10/22/2020   CALCIUM 8.8 10/22/2020   ANIONGAP 14 06/18/2015   EGFR 76 10/22/2020   Lab Results  Component Value Date   CHOL 129 12/17/2019   Lab Results  Component Value Date   HDL 34 (L) 12/17/2019   Lab Results  Component Value Date   LDLCALC 72 12/17/2019   Lab Results  Component Value Date   TRIG 127 12/17/2019   Lab Results  Component Value Date   CHOLHDL 3.8 12/17/2019   Lab Results  Component Value Date   HGBA1C 6.3 (H) 09/24/2020      Assessment & Plan:   1. Diabetes mellitus without complication ([place]) -His hemoglobin A1c was 6.3%, he will continue on current medication, Low carb/no concentrated sweet diet and exercise as tolerated.  2. Primary hypertension -His blood pressure is controlled, he will continue on current medication, DASH diet and exercise as tolerated.   Follow-up: Return in about 13 weeks (around 01/29/2021), or if symptoms worsen or fail to improve.    Kaziyah Parkison Jerold Coombe, NP

## 2020-10-30 NOTE — Patient Instructions (Signed)
https://www.diabeteseducator.org/docs/default-source/living-with-diabetes/conquering-the-grocery-store-v1.pdf?sfvrsn=4">  Carbohydrate Counting for Diabetes Mellitus, Adult Carbohydrate counting is a method of keeping track of how many carbohydrates you eat. Eating carbohydrates naturally increases the amount of sugar (glucose) in the blood. Counting how many carbohydrates you eat improves your bloodglucose control, which helps you manage your diabetes. It is important to know how many carbohydrates you can safely have in each meal. This is different for every person. A dietitian can help you make a meal plan and calculate how many carbohydrates you should have at each meal andsnack. What foods contain carbohydrates? Carbohydrates are found in the following foods: Grains, such as breads and cereals. Dried beans and soy products. Starchy vegetables, such as potatoes, peas, and corn. Fruit and fruit juices. Milk and yogurt. Sweets and snack foods, such as cake, cookies, candy, chips, and soft drinks. How do I count carbohydrates in foods? There are two ways to count carbohydrates in food. You can read food labels or learn standard serving sizes of foods. You can use either of the methods or acombination of both. Using the Nutrition Facts label The Nutrition Facts list is included on the labels of almost all packaged foods and beverages in the [place] It includes: The serving size. Information about nutrients in each serving, including the grams (g) of carbohydrate per serving. To use the Nutrition Facts: Decide how many servings you will have. Multiply the number of servings by the number of carbohydrates per serving. The resulting number is the total amount of carbohydrates that you will be having. Learning the standard serving sizes of foods When you eat carbohydrate foods that are not packaged or do not include Nutrition Facts on the label, you need to measure the servings in order to count the  amount of carbohydrates. Measure the foods that you will eat with a food scale or measuring cup, if needed. Decide how many standard-size servings you will eat. Multiply the number of servings by 15. For foods that contain carbohydrates, one serving equals 15 g of carbohydrates. For example, if you eat 2 cups or 10 oz (300 g) of strawberries, you will have eaten 2 servings and 30 g of carbohydrates (2 servings x 15 g = 30 g). For foods that have more than one food mixed, such as soups and casseroles, you must count the carbohydrates in each food that is included. The following list contains standard serving sizes of common carbohydrate-rich foods. Each of these servings has about 15 g of carbohydrates: 1 slice of bread. 1 six-inch (15 cm) tortilla. ? cup or 2 oz (53 g) cooked rice or pasta.  cup or 3 oz (85 g) cooked or canned, drained and rinsed beans or lentils.  cup or 3 oz (85 g) starchy vegetable, such as peas, corn, or squash.  cup or 4 oz (120 g) hot cereal.  cup or 3 oz (85 g) boiled or mashed potatoes, or  or 3 oz (85 g) of a large baked potato.  cup or 4 fl oz (118 mL) fruit juice. 1 cup or 8 fl oz (237 mL) milk. 1 small or 4 oz (106 g) apple.  or 2 oz (63 g) of a medium banana. 1 cup or 5 oz (150 g) strawberries. 3 cups or 1 oz (24 g) popped popcorn. What is an example of carbohydrate counting? To calculate the number of carbohydrates in this sample meal, follow the stepsshown below. Sample meal 3 oz (85 g) chicken breast. ? cup or 4 oz (106 g) brown rice.    cup or 3 oz (85 g) corn. 1 cup or 8 fl oz (237 mL) milk. 1 cup or 5 oz (150 g) strawberries with sugar-free whipped topping. Carbohydrate calculation Identify the foods that contain carbohydrates: Rice. Corn. Milk. Strawberries. Calculate how many servings you have of each food: 2 servings rice. 1 serving corn. 1 serving milk. 1 serving strawberries. Multiply each number of servings by 15 g: 2 servings  rice x 15 g = 30 g. 1 serving corn x 15 g = 15 g. 1 serving milk x 15 g = 15 g. 1 serving strawberries x 15 g = 15 g. Add together all of the amounts to find the total grams of carbohydrates eaten: 30 g + 15 g + 15 g + 15 g = 75 g of carbohydrates total. What are tips for following this plan? Shopping Develop a meal plan and then make a shopping list. Buy fresh and frozen vegetables, fresh and frozen fruit, dairy, eggs, beans, lentils, and whole grains. Look at food labels. Choose foods that have more fiber and less sugar. Avoid processed foods and foods with added sugars. Meal planning Aim to have the same amount of carbohydrates at each meal and for each snack time. Plan to have regular, balanced meals and snacks. Where to find more information American Diabetes Association: www.diabetes.org Centers for Disease Control and Prevention: www.cdc.gov Summary Carbohydrate counting is a method of keeping track of how many carbohydrates you eat. Eating carbohydrates naturally increases the amount of sugar (glucose) in the blood. Counting how many carbohydrates you eat improves your blood glucose control, which helps you manage your diabetes. A dietitian can help you make a meal plan and calculate how many carbohydrates you should have at each meal and snack. This information is not intended to replace advice given to you by your health care provider. Make sure you discuss any questions you have with your healthcare provider. Document Revised: 04/05/2019 Document Reviewed: 04/06/2019 Elsevier Patient Education  2021 Elsevier Inc.  

## 2020-11-04 ENCOUNTER — Other Ambulatory Visit: Payer: Self-pay | Admitting: Gerontology

## 2020-11-04 ENCOUNTER — Ambulatory Visit: Payer: Medicaid Other

## 2020-11-04 DIAGNOSIS — R5383 Other fatigue: Secondary | ICD-10-CM

## 2020-11-06 ENCOUNTER — Other Ambulatory Visit: Payer: Medicaid Other

## 2020-11-06 ENCOUNTER — Other Ambulatory Visit: Payer: Self-pay

## 2020-11-06 DIAGNOSIS — R5383 Other fatigue: Secondary | ICD-10-CM

## 2020-11-07 ENCOUNTER — Other Ambulatory Visit: Payer: Self-pay

## 2020-11-07 LAB — VITAMIN D 25 HYDROXY (VIT D DEFICIENCY, FRACTURES): Vit D, 25-Hydroxy: 50.9 ng/mL (ref 30.0–100.0)

## 2020-11-07 LAB — B12 AND FOLATE PANEL
Folate: 6.3 ng/mL (ref >3.0)
Vitamin B-12: 274 pg/mL (ref 232–1245)

## 2020-11-07 MED FILL — Trazodone HCl Tab 100 MG: ORAL | 30 days supply | Qty: 60 | Fill #3 | Status: AC

## 2020-11-11 ENCOUNTER — Ambulatory Visit: Payer: Medicaid Other

## 2020-12-02 ENCOUNTER — Other Ambulatory Visit: Payer: Self-pay

## 2020-12-02 ENCOUNTER — Ambulatory Visit: Payer: Medicaid Other | Admitting: Endocrinology

## 2020-12-02 DIAGNOSIS — E119 Type 2 diabetes mellitus without complications: Secondary | ICD-10-CM

## 2020-12-02 DIAGNOSIS — I25118 Atherosclerotic heart disease of native coronary artery with other forms of angina pectoris: Secondary | ICD-10-CM

## 2020-12-02 DIAGNOSIS — I10 Essential (primary) hypertension: Secondary | ICD-10-CM

## 2020-12-02 MED ORDER — LOSARTAN POTASSIUM 100 MG PO TABS
100.0000 mg | ORAL_TABLET | Freq: Every day | ORAL | 4 refills | Status: DC
  Filled 2020-12-02: qty 90, 90d supply, fill #0
  Filled 2021-03-21: qty 90, 90d supply, fill #1
  Filled 2021-06-15: qty 90, 90d supply, fill #2
  Filled 2021-09-16 – 2021-10-02 (×2): qty 90, 90d supply, fill #0
  Filled 2021-11-21: qty 90, 90d supply, fill #1

## 2020-12-02 MED ORDER — TRULICITY 0.75 MG/0.5ML ~~LOC~~ SOAJ
0.7500 mg | SUBCUTANEOUS | 0 refills | Status: DC
Start: 2020-12-02 — End: 2020-12-30
  Filled 2020-12-02: qty 2, 28d supply, fill #0

## 2020-12-02 MED ORDER — TRULICITY 3 MG/0.5ML ~~LOC~~ SOAJ
3.0000 mg | SUBCUTANEOUS | 0 refills | Status: DC
Start: 2020-12-02 — End: 2021-01-30
  Filled 2020-12-02 – 2021-01-26 (×2): qty 2, 28d supply, fill #0

## 2020-12-02 MED ORDER — METFORMIN HCL 500 MG PO TABS
500.0000 mg | ORAL_TABLET | Freq: Two times a day (BID) | ORAL | 4 refills | Status: DC
  Filled 2020-12-02: qty 180, 90d supply, fill #0
  Filled 2021-03-29: qty 180, 90d supply, fill #1
  Filled 2021-07-01: qty 180, 90d supply, fill #2
  Filled 2021-09-16 – 2021-10-11 (×2): qty 180, 90d supply, fill #0

## 2020-12-02 MED ORDER — TRULICITY 1.5 MG/0.5ML ~~LOC~~ SOAJ
1.5000 mg | SUBCUTANEOUS | 0 refills | Status: DC
  Filled 2020-12-02 – 2020-12-30 (×2): qty 2, 28d supply, fill #0

## 2020-12-02 MED ORDER — TRULICITY 4.5 MG/0.5ML ~~LOC~~ SOAJ
4.5000 mg | SUBCUTANEOUS | 12 refills | Status: DC
Start: 2020-12-02 — End: 2022-01-11
  Filled 2020-12-02: qty 2, 28d supply, fill #0
  Filled 2021-02-24: qty 8, 112d supply, fill #0
  Filled 2021-05-13 – 2021-06-12 (×2): qty 8, 112d supply, fill #1
  Filled 2021-09-16: qty 8, 112d supply, fill #0

## 2020-12-02 NOTE — Patient Instructions (Signed)
Start Trulicity at [id number] mg weekly for 1 month and then 1.5 mg for 1 month and then 3 mg for 1 month then 4.5 mg weekly forever or until we switch to [employer] or [employer].  If you are having a tough time with stomach, you can wait on going up.  Stop glipizide  Increase metformin to 500 mg twice a day  Increase losartan to 100 mg a day  Eat small meals, eat slowly and stop eating when you are not hungry.

## 2020-12-02 NOTE — Progress Notes (Signed)
New Diabetes Consultation [facility]     Patient ID: Larry French, male   DOB: 1961-04-09, 60 y.o.   MRN: [id number] Assessment:  Larry French is a 60 y.o. male who is seen in consultation for T2DM management at the request of [place], Chioma E, NP.  1. Diabetes mellitus without complication ([place])   2. 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 Essential hypertension    Endocrine Narrative Assessment:  Patient is currently at goal with treatment. His most recent HbA1c was 6.3% (09/24/2020) (down from 9.5% on 07/21).   Plan:     Patient Instructions  Start Trulicity at 4.12 mg weekly for 1 month and then 1.5 mg for 1 month and then 3 mg for 1 month then 4.5 mg weekly forever or until we switch to Ozempic or Mounjaro.  If you are having a tough time with stomach, you can wait on going up.  Stop glipizide  Increase metformin to 500 mg twice a day  Increase losartan to 100 mg a day  Eat small meals, eat slowly and stop eating when you are not hungry.   T2DM management -Increase metformin 500 mg tab BID (from 500 mg once daily). -Discontinue glipizide. -Start dulaglutide (Trulicity) 8.78 mg weekly injection for 4 weeks then increase to 1.5 mg weekly injection. -Schedule routine DM follow-up in 3 months (November). -Schedule HbA1c closer to 62-month visit in November. -Follow up regarding Ophthalmology referral (patient is on the list).  HTN -Increase losartan to 100 mg daily.   No orders of the defined types were placed in this encounter.  Subjective:  Patient is a 60 year old male with 2-3-year history of T2DM and history of HTN, CAD, hyperlipidemia, skin cancer, alcohol abuse, and depression who presents for routine T2DM follow-up.  Patient reports that he does not measure his BG on a daily basis, stating that he "doesn't feel like [he] needs it." He currently manages his  DM with metformin 500 mg tab once daily and glipizide (Glucotrol) 5 mg tab daily before breakfast. He reports symptoms of hypoglycemia 2-3x/week when he has not eaten; these symptoms subside once he eats. He denies symptoms of hyperglycemia and peripheral neuropathy. He reports fatigue and decreased energy for the past year, which he attributes to the lower back pain since his weight gain (mostly around his abdomen).   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 He states that he tries to maintain a daily "1,000 calorie deficit" paying close attention to his calorie intake. However, he states that his biggest concern is the weight gain since January (210 to 228 lbs). He expressed interest in starting semaglutide. He reports smoking 3/4 pack of cigarettes daily (less than previously since he started smoking at 60 years old). He states that he prefers to focus on weight loss before working towards smoking cessation. He states that gardening is his main form of exercise.     Review of Systems  Constitutional:  Positive for fatigue.  Endocrine: Negative for polydipsia and polyuria.       Larry French  has a past medical history of Alcohol abuse, Anxiety, CA - skin cancer, CAD, residual CFX LAD disease (06/01/2011), Depression, Headache(784.0), Hyperlipemia, Hypertension, Insomnia, Mental disorder, MI (myocardial infarction) ([place]), Polysubstance abuse ([place]), and PTSD (post-traumatic stress disorder).  Larry French family history includes Anxiety disorder in his father  and mother; Cancer in his mother; Coronary artery disease (age of onset: 27) in his maternal grandfather; Depression in his father and mother; Other in his brother.  Larry French  reports that he has been smoking cigarettes. He has a 30.00 pack-year smoking history. He has never used smokeless tobacco. He reports that he does not drink alcohol and does not use  drugs.   Current Outpatient Medications:    aspirin 81 MG EC tablet, TAKE ONE TABLET BY MOUTH EVERY DAY, Disp: 90 tablet, Rfl: 0   blood glucose meter kit and supplies KIT, Dispense based on patient and insurance preference. Use up to four times daily as directed. (FOR ICD-9 250.00, 250.01)., Disp: 1 each, Rfl: 0   clonazePAM (KLONOPIN) 1 MG tablet, Take 1 mg by mouth 2 (two) times daily as needed for anxiety., Disp: , Rfl:    clopidogrel (PLAVIX) 75 MG tablet, TAKE ONE TABLET BY MOUTH EVERY DAY, Disp: 90 tablet, Rfl: 1   Dulaglutide (TRULICITY) 5.39 JQ/7.3AL SOPN, Inject 0.75 mg into the skin once a week., Disp: 2 mL, Rfl: 0   Dulaglutide (TRULICITY) 1.5 PF/7.9KW SOPN, Inject 1.5 mg into the skin once a week. Start after 0.75 mg for 4 weeks, Disp: 2 mL, Rfl: 0   Dulaglutide (TRULICITY) 3 IO/9.7DZ SOPN, Inject 3 mg as directed once a week. After taking 1.5 mg for 4 weeks, Disp: 2 mL, Rfl: 0   Dulaglutide (TRULICITY) 4.5 HG/9.9ME SOPN, Inject 4.5 mg as directed once a week., Disp: 2 mL, Rfl: 12   losartan (COZAAR) 100 MG tablet, Take 1 tablet (100 mg total) by mouth daily. Instead of 75 mg a day, Disp: 90 tablet, Rfl: 4   metoprolol tartrate (LOPRESSOR) 25 MG tablet, TAKE ONE TABLET BY MOUTH 2 TIMES A DAY, Disp: 180 tablet, Rfl: 1   QUEtiapine (SEROQUEL) 200 MG tablet, Take one tablet ($RemoveBef'200mg'VWBpTuukBF$  total) by mouth once nightly as needed in addition to the $Remo'400mg'wzqmy$  (Patient taking differently: Take one tablet ($RemoveBef'200mg'PkhXIwXagr$  total) by mouth once nightly as needed in addition to the $Remo'400mg'aGxQh$  Patient taking 3 nights/week), Disp: 90 tablet, Rfl: 1   QUEtiapine (SEROQUEL) 400 MG tablet, Take 1 tablet (400 mg total) by mouth once nightly., Disp: 90 tablet, Rfl: 1   rosuvastatin (CRESTOR) 20 MG tablet, TAKE ONE TABLET BY MOUTH EVERY DAY, Disp: 30 tablet, Rfl: 2   traMADol (ULTRAM) 50 MG tablet, Take 50 mg by mouth 3 (three) times daily as needed for moderate pain., Disp: , Rfl:    traZODone (DESYREL) 100 MG tablet, TAKE 1 OR 2  TABLETS BY MOUTH AT BEDTIME, Disp: 180 tablet, Rfl: 1   metFORMIN (GLUCOPHAGE) 500 MG tablet, Take 1 tablet (500 mg total) by mouth 2 (two) times daily with a meal., Disp: 180 tablet, Rfl: 4  No Known Allergies Objective:    Physical Exam Constitutional:      Appearance: Normal appearance.  Neurological:     Mental Status: He is alert.  Psychiatric:        Mood and Affect: Mood normal.        Behavior: Behavior normal.  Foot exam: Ingrown toe nail on right foot (some erythema)      Data/Results/Labs  : I have personally reviewed pertinent labs and imaging studies, if indicated,  with the patient in clinic today.  Results for orders placed or performed in visit on 11/06/20 (from the past 672 hour(s))  B12 and Folate Panel   Collection Time: 11/06/20  6:15 PM  Result  Value Ref Range   Vitamin B-12 274 232 - 1,245 pg/mL   Folate 6.3 >3.0 ng/mL  Vitamin D (25 hydroxy)   Collection Time: 11/06/20  6:15 PM  Result Value Ref Range   Vit D, 25-Hydroxy 50.9 30.0 - 100.0 ng/mL  ]  HC Readings from Last 3 Encounters:  No data found for [facility]    Wt Readings from Last 3 Encounters:  12/02/20 228 lb (103.4 kg)  10/30/20 226 lb 9.6 oz (102.8 kg)  10/22/20 226 lb 11.2 oz (102.8 kg)

## 2020-12-03 ENCOUNTER — Other Ambulatory Visit: Payer: Self-pay | Admitting: Emergency Medicine

## 2020-12-03 ENCOUNTER — Other Ambulatory Visit: Payer: Self-pay

## 2020-12-03 DIAGNOSIS — E119 Type 2 diabetes mellitus without complications: Secondary | ICD-10-CM

## 2020-12-16 ENCOUNTER — Other Ambulatory Visit: Payer: Self-pay

## 2020-12-17 ENCOUNTER — Other Ambulatory Visit: Payer: Self-pay

## 2020-12-23 ENCOUNTER — Other Ambulatory Visit: Payer: Self-pay

## 2020-12-24 ENCOUNTER — Other Ambulatory Visit: Payer: Self-pay

## 2020-12-25 ENCOUNTER — Other Ambulatory Visit: Payer: Self-pay

## 2020-12-26 ENCOUNTER — Other Ambulatory Visit: Payer: Self-pay

## 2020-12-26 MED ORDER — TRAZODONE HCL 100 MG PO TABS
ORAL_TABLET | ORAL | 1 refills | Status: DC
  Filled 2020-12-26: qty 60, 30d supply, fill #0
  Filled 2021-02-05: qty 60, 30d supply, fill #1

## 2020-12-29 ENCOUNTER — Other Ambulatory Visit: Payer: Self-pay

## 2020-12-30 ENCOUNTER — Other Ambulatory Visit: Payer: Self-pay

## 2020-12-31 ENCOUNTER — Other Ambulatory Visit: Payer: Self-pay

## 2020-12-31 ENCOUNTER — Other Ambulatory Visit: Payer: Medicaid Other

## 2020-12-31 ENCOUNTER — Telehealth: Payer: Self-pay | Admitting: Pharmacist

## 2020-12-31 DIAGNOSIS — E119 Type 2 diabetes mellitus without complications: Secondary | ICD-10-CM

## 2020-12-31 NOTE — Telephone Encounter (Signed)
provider  -- Elmer Picker - Wednesday, December 31, 2020 10:23 AM --Received pharmacy printouts from [facility] for Trulicity '3mg'$ -Inject '3mg'$  once a week for 4 weeks. # 1 box  No refills (after taking 1.'5mg'$  for 4 weeks.-pt was given a sample of 1.'5mg'$  on 12/30/20 per [facility]). Also Trulicity 4.'5mg'$  Inject 4.'5mg'$  once a week. # 4 boxes   3 refills Mailing [name] application to patient to sign & return, also requesting more current check stubs-we have April. Sending provider portion to [facility] for [place] to sign.

## 2021-01-01 LAB — HEMOGLOBIN A1C
Est. average glucose Bld gHb Est-mCnc: 111 mg/dL
Hgb A1c MFr Bld: 5.5 % (ref 4.8–5.6)

## 2021-01-02 ENCOUNTER — Other Ambulatory Visit: Payer: Self-pay

## 2021-01-07 ENCOUNTER — Telehealth: Payer: Self-pay | Admitting: Pharmacist

## 2021-01-07 NOTE — Telephone Encounter (Signed)
--   Larry French - Wednesday, January 07, 2021 2:32 PM --I have received the signed provider portion of [name] applications for Trulicity 3mg  & Trulicity 4.5mg --holding for patient to return his portion--mailed to patient 12/31/2020.

## 2021-01-12 ENCOUNTER — Telehealth: Payer: Self-pay | Admitting: Pharmacist

## 2021-01-12 NOTE — Telephone Encounter (Signed)
--   Elmer Picker - Monday, January 12, 2021 11:31 AM --[employer] application for Trulicity 3mg  Inject 1 pen once a week for 4 weeks # 1 box No refills Trulicity 4.5mg  Inject one a week # 4 boxes  Set to [employer] for refills. (( Patient is currently taking 1.5mg  --Samples given))

## 2021-01-19 ENCOUNTER — Other Ambulatory Visit: Payer: Self-pay

## 2021-01-26 ENCOUNTER — Other Ambulatory Visit: Payer: Self-pay

## 2021-01-27 ENCOUNTER — Other Ambulatory Visit: Payer: Self-pay

## 2021-01-28 ENCOUNTER — Ambulatory Visit: Payer: Medicaid Other | Admitting: Gerontology

## 2021-01-28 ENCOUNTER — Encounter: Payer: Self-pay | Admitting: Gerontology

## 2021-01-28 ENCOUNTER — Other Ambulatory Visit: Payer: Self-pay

## 2021-01-28 VITALS — BP 126/84 | HR 86 | Temp 97.5°F | Resp 16 | Ht 68.0 in | Wt 219.1 lb

## 2021-01-28 DIAGNOSIS — I10 Essential (primary) hypertension: Secondary | ICD-10-CM

## 2021-01-28 DIAGNOSIS — E119 Type 2 diabetes mellitus without complications: Secondary | ICD-10-CM

## 2021-01-28 DIAGNOSIS — E785 Hyperlipidemia, unspecified: Secondary | ICD-10-CM

## 2021-01-28 MED ORDER — ROSUVASTATIN CALCIUM 20 MG PO TABS
ORAL_TABLET | Freq: Every day | ORAL | 2 refills | Status: DC
  Filled 2021-01-28: qty 30, 30d supply, fill #0
  Filled 2021-03-21: qty 30, 30d supply, fill #1
  Filled 2021-04-21: qty 30, 30d supply, fill #2

## 2021-01-28 MED ORDER — METOPROLOL TARTRATE 25 MG PO TABS
ORAL_TABLET | Freq: Two times a day (BID) | ORAL | 1 refills | Status: DC
  Filled 2021-01-28: qty 180, 90d supply, fill #0
  Filled 2021-05-17: qty 180, 90d supply, fill #1

## 2021-01-28 NOTE — Progress Notes (Signed)
Established Patient Office Visit  Subjective:  Patient ID: Larry French, male    DOB: [birth year]  Age: 60 y.o. MRN: [id number]  CC:  Chief Complaint  Patient presents with   Follow-up    Labs drawn 12/31/20    HPI Larry French is a 60 y/o male with PMH of Alcohol abuse, T2DM, Anxiety, CA-skin cancer, CAD, Depression, Hyperlipidemia, Hypertension, presents for follow up visit, lab review and medication refill.  His hemoglobin A1c done on 12/31/2020 decreased from 6.3% to 5.5%.  He states that he is compliant with his medications, denies side effects and continues to make healthy lifestyle changes.  He was seen by [facility] endocrinology team on 12/02/2020, his metformin was increased to 500 mg twice daily, started on Trulicity 8.56 mg weekly for 1 month, encouraged to 1.5 mg for another month he will follow-up with endocrinology team in November.  He denies hypo-/hyperglycemic symptoms, peripheral neuropathy and performs daily foot checks.  His losartan was increased to 100 mg daily.  He denies chest pain, palpitation, headache, dizziness and vision changes.  Overall, he states that he is doing well and offers no further complaint.  Past Medical History:  Diagnosis Date   Alcohol abuse    Recovered x 15 months   Anxiety    CA - skin cancer    CAD, residual CFX LAD disease 06/01/2011   a. s/p inferior ST elevation MI s/p PCI/DES to RCA on 05/29/2011; b. residual LAD and LCx CAD tx'd on 06/02/11 cath with LAD CAD successfully tx'd w/ PCI/DES, LCx managaged medically    Depression    Headache(784.0)    Hyperlipemia    Hypertension    Insomnia    Mental disorder    MI (myocardial infarction) ([place])    Polysubstance abuse ([place])    a. etoh, xanax, tobacco   PTSD (post-traumatic stress disorder)     Past Surgical History:  Procedure Laterality Date   CARDIAC CATHETERIZATION Bilateral 04/09/2015   Procedure: Coronary Angiogram;  Surgeon: Wellington Hampshire, MD;  Location: [place]  CV LAB;  Service: Cardiovascular;  Laterality: Bilateral;   CARDIAC CATHETERIZATION [name] 04/09/2015   Procedure: Coronary Stent Intervention;  Surgeon: Wellington Hampshire, MD;  Location: [place] [facility] LAB;  Service: Cardiovascular;  Laterality: [name];   COLONOSCOPY WITH PROPOFOL [name] 03/19/2019   Procedure: COLONOSCOPY WITH PROPOFOL;  Surgeon: Lin Landsman, MD;  Location: [facility] ENDOSCOPY;  Service: Gastroenterology;  Laterality: [name];   LEFT HEART CATH Right 05/29/2011   Procedure: LEFT HEART CATH;  Surgeon: Leonie Man, MD;  Location: [facility] CATH LAB;  Service: Cardiovascular;  Laterality: Right;   LEFT HEART CATHETERIZATION WITH CORONARY ANGIOGRAM [name] 06/01/2011   Procedure: LEFT HEART CATHETERIZATION WITH CORONARY ANGIOGRAM;  Surgeon: Leonie Man, MD;  Location: [facility] CATH LAB;  Service: Cardiovascular;  Laterality: [name];  relook cath, possible PCI   TONSILLECTOMY AND ADENOIDECTOMY      Family History  Problem Relation Age of Onset   Cancer Mother        Non-Hodgkin lymphoma   Depression Mother    Anxiety disorder Mother    Depression Father    Anxiety disorder Father    Other Brother        MVA   Coronary artery disease Maternal Grandfather 81       Died    Social History   Socioeconomic History   Marital status: Single    Spouse name: Not on file   Number of children: 0  Years of education: Not on file   Highest education level: Not on file  Occupational History   Occupation: Personal Shopper    Employer: [employer]  Tobacco Use   Smoking status: Every Day    Packs/day: 1.00    Years: 30.00    Pack years: 30.00    Types: Cigarettes   Smokeless tobacco: Never  Vaping Use   Vaping Use: Never used  Substance and Sexual Activity   Alcohol use: No    Comment: Previous user (sober x 5 years)   Drug use: No   Sexual activity: Not Currently  Other Topics Concern   Not on file  Social History Narrative   Not on file   Social Determinants of Health   Financial  Resource Strain: Not on file  Food Insecurity: No Food Insecurity   Worried About Running Out of Food in the Last Year: Never true   Ran Out of Food in the Last Year: Never true  Transportation Needs: No Transportation Needs   Lack of Transportation (Medical): No   Lack of Transportation (Non-Medical): No  Physical Activity: Not on file  Stress: Not on file  Social Connections: Not on file  Intimate Partner Violence: Not on file    Outpatient Medications Prior to Visit  Medication Sig Dispense Refill   aspirin 81 MG EC tablet TAKE ONE TABLET BY MOUTH EVERY DAY 90 tablet 0   blood glucose meter kit and supplies KIT Dispense based on patient and insurance preference. Use up to four times daily as directed. (FOR ICD-9 250.00, 250.01). 1 each 0   clonazePAM (KLONOPIN) 1 MG tablet Take 1 mg by mouth 2 (two) times daily as needed for anxiety.     clopidogrel (PLAVIX) 75 MG tablet TAKE ONE TABLET BY MOUTH EVERY DAY 90 tablet 1   Dulaglutide (TRULICITY) 3 IO/9.6EX SOPN Inject 3 mg as directed once a week. After taking 1.5 mg for 4 weeks 2 mL 0   losartan (COZAAR) 100 MG tablet Take 1 tablet (100 mg total) by mouth once daily. Instead of 75 mg a day 90 tablet 4   metFORMIN (GLUCOPHAGE) 500 MG tablet Take 1 tablet (500 mg total) by mouth 2 (two) times daily with a meal. 180 tablet 4   QUEtiapine (SEROQUEL) 200 MG tablet Take one tablet ($RemoveBef'200mg'qUPTdIgJSt$  total) by mouth once nightly as needed in addition to the $Remo'400mg'WAfvk$  (Patient taking differently: Take one tablet ($RemoveBef'200mg'AcZfaIBSwy$  total) by mouth once nightly as needed in addition to the $Remo'400mg'DbSCo$  Patient taking 3 nights/week) 90 tablet 1   QUEtiapine (SEROQUEL) 400 MG tablet Take 1 tablet (400 mg total) by mouth once nightly. 90 tablet 1   traMADol (ULTRAM) 50 MG tablet Take 50 mg by mouth 3 (three) times daily as needed for moderate pain.     traZODone (DESYREL) 100 MG tablet TAKE 1 OR 2 TABLETS BY MOUTH ONCE DAILY AT BEDTIME. 180 tablet 1   metoprolol tartrate (LOPRESSOR) 25  MG tablet TAKE ONE TABLET BY MOUTH 2 TIMES A DAY 180 tablet 1   rosuvastatin (CRESTOR) 20 MG tablet TAKE ONE TABLET BY MOUTH EVERY DAY 30 tablet 2   Dulaglutide (TRULICITY) 4.5 BM/8.4XL SOPN Inject 4.5 mg as directed once a week. 2 mL 12   No facility-administered medications prior to visit.    No Known Allergies  ROS Review of Systems  Constitutional: Negative.   HENT: Negative.    Eyes: Negative.   Respiratory: Negative.    Cardiovascular: Negative.   Endocrine:  Negative.   Skin: Negative.   Neurological: Negative.      Objective:    Physical Exam HENT:     Head: Normocephalic and atraumatic.     Mouth/Throat:     Mouth: Mucous membranes are moist.  Eyes:     Extraocular Movements: Extraocular movements intact.     Conjunctiva/sclera: Conjunctivae normal.     Pupils: Pupils are equal, round, and reactive to light.  Cardiovascular:     Rate and Rhythm: Normal rate and regular rhythm.     Pulses: Normal pulses.     Heart sounds: Normal heart sounds.  Pulmonary:     Effort: Pulmonary effort is normal.     Breath sounds: Normal breath sounds.  Skin:    General: Skin is warm.  Neurological:     General: No focal deficit present.     Mental Status: He is alert and oriented to person, place, and time. Mental status is at baseline.  Psychiatric:        Mood and Affect: Mood normal.        Behavior: Behavior normal.        Thought Content: Thought content normal.        Judgment: Judgment normal.    BP 126/84 (BP Location: Right Arm, Patient Position: Sitting, Cuff Size: Large)   Pulse 86   Temp (!) 97.5 F (36.4 C)   Resp 16   Ht _0  (1.727 m)   Wt 219 lb 1.6 oz (99.4 kg)   SpO2 95%   BMI 33.31 kg/m  Wt Readings from Last 3 Encounters:  01/28/21 219 lb 1.6 oz (99.4 kg)  12/31/20 221 lb 4.8 oz (100.4 kg)  12/02/20 228 lb (103.4 kg)   Encouraged weight loss  Health Maintenance Due  Topic Date Due   HIV Screening  Never done   Hepatitis C Screening   Never done   TETANUS/TDAP  Never done   Zoster Vaccines- Shingrix (1 of 2) Never done   OPHTHALMOLOGY EXAM  11/25/2017   COVID-19 Vaccine (2 - Moderna risk series) 08/16/2019    There are no preventive care reminders to display for this patient.  Lab Results  Component Value Date   TSH 2.790 03/02/2018   Lab Results  Component Value Date   WBC 10.3 03/02/2018   HGB 17.7 03/02/2018   HCT 50.2 03/02/2018   MCV 86 03/02/2018   PLT 254 03/02/2018   Lab Results  Component Value Date   NA 142 10/22/2020   K 4.6 10/22/2020   CO2 20 10/22/2020   GLUCOSE 104 (H) 10/22/2020   BUN 6 10/22/2020   CREATININE 1.12 10/22/2020   BILITOT <0.2 10/22/2020   ALKPHOS 98 10/22/2020   AST 17 10/22/2020   ALT 18 10/22/2020   PROT 6.4 10/22/2020   ALBUMIN 4.2 10/22/2020   CALCIUM 8.8 10/22/2020   ANIONGAP 14 06/18/2015   EGFR 76 10/22/2020   Lab Results  Component Value Date   CHOL 129 12/17/2019   Lab Results  Component Value Date   HDL 34 (L) 12/17/2019   Lab Results  Component Value Date   LDLCALC 72 12/17/2019   Lab Results  Component Value Date   TRIG 127 12/17/2019   Lab Results  Component Value Date   CHOLHDL 3.8 12/17/2019   Lab Results  Component Value Date   HGBA1C 5.5 12/31/2020      Assessment & Plan:   1. Essential hypertension -His blood pressure is improving, he will continue  on current medication, DASH diet and exercises as tolerated. - metoprolol tartrate (LOPRESSOR) 25 MG tablet; TAKE ONE TABLET BY MOUTH 2 TIMES A DAY  Dispense: 180 tablet; Refill: 1  2. Hyperlipidemia, unspecified hyperlipidemia type -He will continue on current medication, low-fat/cholesterol diet and exercise as tolerated. - rosuvastatin (CRESTOR) 20 MG tablet; TAKE ONE TABLET BY MOUTH EVERY DAY  Dispense: 30 tablet; Refill: 2  3. Controlled type 2 diabetes mellitus without complication, without long-term current use of insulin (HCC) -His hemoglobin A1c is improved to 5.5%.  He  will continue on current medication and follow-up with [facility] endocrinology November.  He was encouraged to continue on low carbohydrate/no concentrated sweet diet and exercise as tolerated.     Follow-up: Return in about 3 months (around 04/30/2021).    Kyrel Leighton Jerold Coombe, NP

## 2021-01-28 NOTE — Patient Instructions (Signed)

## 2021-01-30 ENCOUNTER — Other Ambulatory Visit: Payer: Self-pay

## 2021-02-06 ENCOUNTER — Other Ambulatory Visit: Payer: Self-pay

## 2021-02-22 ENCOUNTER — Other Ambulatory Visit: Payer: Self-pay

## 2021-02-24 ENCOUNTER — Other Ambulatory Visit: Payer: Self-pay

## 2021-02-24 MED ORDER — QUETIAPINE FUMARATE 400 MG PO TABS
ORAL_TABLET | ORAL | 1 refills | Status: DC
  Filled 2021-02-24: qty 30, 30d supply, fill #0

## 2021-02-24 MED ORDER — QUETIAPINE FUMARATE 200 MG PO TABS
ORAL_TABLET | ORAL | 1 refills | Status: DC
  Filled 2021-02-24: qty 30, 30d supply, fill #0

## 2021-02-25 ENCOUNTER — Other Ambulatory Visit: Payer: Medicaid Other

## 2021-02-25 ENCOUNTER — Other Ambulatory Visit: Payer: Self-pay

## 2021-02-26 ENCOUNTER — Other Ambulatory Visit: Payer: Self-pay

## 2021-02-26 MED ORDER — QUETIAPINE FUMARATE 200 MG PO TABS
200.0000 mg | ORAL_TABLET | Freq: Every evening | ORAL | 1 refills | Status: DC
Start: 2021-02-25 — End: 2021-08-21
  Filled 2021-03-23: qty 30, 30d supply, fill #0
  Filled 2021-04-21: qty 30, 30d supply, fill #1
  Filled 2021-05-18: qty 30, 30d supply, fill #2
  Filled 2021-07-01: qty 30, 30d supply, fill #3
  Filled 2021-08-05: qty 30, 30d supply, fill #4

## 2021-02-26 MED ORDER — QUETIAPINE FUMARATE 400 MG PO TABS
400.0000 mg | ORAL_TABLET | ORAL | 1 refills | Status: DC
  Filled 2021-03-23: qty 30, 30d supply, fill #0
  Filled 2021-04-20: qty 30, 30d supply, fill #1
  Filled 2021-05-17: qty 30, 30d supply, fill #2
  Filled 2021-06-15: qty 30, 30d supply, fill #3
  Filled 2021-07-12: qty 30, 30d supply, fill #4
  Filled 2021-08-05: qty 30, 30d supply, fill #5

## 2021-02-26 MED ORDER — TRAZODONE HCL 100 MG PO TABS
100.0000 mg | ORAL_TABLET | ORAL | 1 refills | Status: DC
Start: 2021-02-25 — End: 2021-08-21
  Filled 2021-03-18: qty 60, 30d supply, fill #0
  Filled 2021-04-20: qty 60, 30d supply, fill #1
  Filled 2021-05-18: qty 60, 30d supply, fill #2
  Filled 2021-06-15: qty 60, 30d supply, fill #3
  Filled 2021-07-12: qty 60, 30d supply, fill #4
  Filled 2021-08-05: qty 60, 30d supply, fill #5

## 2021-03-03 ENCOUNTER — Ambulatory Visit: Payer: Medicaid Other

## 2021-03-06 ENCOUNTER — Other Ambulatory Visit: Payer: Self-pay

## 2021-03-18 ENCOUNTER — Other Ambulatory Visit: Payer: Self-pay

## 2021-03-23 ENCOUNTER — Other Ambulatory Visit: Payer: Self-pay

## 2021-03-30 ENCOUNTER — Other Ambulatory Visit: Payer: Self-pay

## 2021-04-01 ENCOUNTER — Other Ambulatory Visit: Payer: Medicaid Other

## 2021-04-21 ENCOUNTER — Other Ambulatory Visit: Payer: Self-pay

## 2021-04-21 ENCOUNTER — Ambulatory Visit: Payer: Medicaid Other

## 2021-04-22 ENCOUNTER — Other Ambulatory Visit: Payer: Medicaid Other

## 2021-04-29 ENCOUNTER — Ambulatory Visit: Payer: Medicaid Other | Admitting: Gerontology

## 2021-04-29 ENCOUNTER — Other Ambulatory Visit: Payer: Self-pay

## 2021-04-29 VITALS — BP 151/84 | HR 67 | Temp 97.6°F | Resp 16 | Wt 217.1 lb

## 2021-04-29 DIAGNOSIS — H6120 Impacted cerumen, unspecified ear: Secondary | ICD-10-CM | POA: Insufficient documentation

## 2021-04-29 DIAGNOSIS — H6123 Impacted cerumen, bilateral: Secondary | ICD-10-CM

## 2021-04-29 DIAGNOSIS — I10 Essential (primary) hypertension: Secondary | ICD-10-CM

## 2021-04-29 NOTE — Progress Notes (Signed)
Established Patient Office Visit  Subjective:  Patient ID: Larry French, male    DOB: 09/08/1960  Age: 61 y.o. MRN: [id number]  CC: No chief complaint on file.   HPI Larry French is a 61 y/o male with PMH of Alcohol abuse, T2DM, Anxiety, CA-skin cancer, CAD, Depression, Hyperlipidemia, Hypertension, presents for follow up. He was seen at [facility] clinic on 02/25/21 by Dr Roetta Sessions for Ophthalmology exam and it showed No diabetic retinopathy or other abnormality noted. No referral needed. Recommend routine annual screening again in 1 year. His labs were done on 02/18/21 at [facility] clinic and was reviewed by Dr Ginette Pitman. He states that he's compliant with his medications and continues to make healthy life style changes. Currently, he c/o hearing loss to left ear. He states that he was told that he had 80% hearing loss per [facility] ENT specialist and was told that he requires hearing aide. He reports constant tinnitus especially to his left ear that's chronic and right ear. He denies vertigo, otalgia and discharge. Overall, he states that he's doing well and offers no further complaint.  Past Medical History:  Diagnosis Date   Alcohol abuse    Recovered x 15 months   Anxiety    CA - skin cancer    CAD, residual CFX LAD disease 06/01/2011   a. s/p inferior ST elevation MI s/p PCI/DES to RCA on 05/29/2011; b. residual LAD and LCx CAD tx'd on 06/02/11 cath with LAD CAD successfully tx'd w/ PCI/DES, LCx managaged medically    Depression    Headache(784.0)    Hyperlipemia    Hypertension    Insomnia    Mental disorder    MI (myocardial infarction) ([place])    Polysubstance abuse ([place])    a. etoh, xanax, tobacco   PTSD (post-traumatic stress disorder)     Past Surgical History:  Procedure Laterality Date   CARDIAC CATHETERIZATION Bilateral 04/09/2015   Procedure: Coronary Angiogram;  Surgeon: Wellington Hampshire, MD;  Location: [place] [facility];  Service: Cardiovascular;  Laterality:  Bilateral;   CARDIAC CATHETERIZATION [name] 04/09/2015   Procedure: Coronary Stent Intervention;  Surgeon: Wellington Hampshire, MD;  Location: [place] [facility];  Service: Cardiovascular;  Laterality: [name];   COLONOSCOPY WITH PROPOFOL [name] 03/19/2019   Procedure: COLONOSCOPY WITH PROPOFOL;  Surgeon: Lin Landsman, MD;  Location: [facility] ENDOSCOPY;  Service: Gastroenterology;  Laterality: [name];   LEFT HEART CATH Right 05/29/2011   Procedure: LEFT HEART CATH;  Surgeon: Leonie Man, MD;  Location: [facility] CATH LAB;  Service: Cardiovascular;  Laterality: Right;   LEFT HEART CATHETERIZATION WITH CORONARY ANGIOGRAM [name] 06/01/2011   Procedure: LEFT HEART CATHETERIZATION WITH CORONARY ANGIOGRAM;  Surgeon: Leonie Man, MD;  Location: [facility] CATH LAB;  Service: Cardiovascular;  Laterality: [name];  relook cath, possible PCI   TONSILLECTOMY AND ADENOIDECTOMY      Family History  Problem Relation Age of Onset   Cancer Mother        Non-Hodgkin lymphoma   Depression Mother    Anxiety disorder Mother    Depression Father    Anxiety disorder Father    Other Brother        MVA   Coronary artery disease Maternal Grandfather 38       Died    Social History   Socioeconomic History   Marital status: Single    Spouse name: Not on file   Number of children: 0   Years of education:  Not on file   Highest education level: Not on file  Occupational History   Occupation: Personal Shopper    Employer: [employer]  Tobacco Use   Smoking status: Every Day    Packs/day: 1.00    Years: 30.00    Pack years: 30.00    Types: Cigarettes   Smokeless tobacco: Never  Vaping Use   Vaping Use: Never used  Substance and Sexual Activity   Alcohol use: No    Comment: Previous user (sober x 5 years)   Drug use: No   Sexual activity: Not Currently  Other Topics Concern   Not on file  Social History Narrative   Not on file   Social Determinants of Health   Financial Resource Strain: Not on file  Food Insecurity:  No Food Insecurity   Worried About Running Out of Food in the Last Year: Never true   Ran Out of Food in the Last Year: Never true  Transportation Needs: No Transportation Needs   Lack of Transportation (Medical): No   Lack of Transportation (Non-Medical): No  Physical Activity: Not on file  Stress: Not on file  Social Connections: Not on file  Intimate Partner Violence: Not on file    Outpatient Medications Prior to Visit  Medication Sig Dispense Refill   aspirin 81 MG EC tablet TAKE ONE TABLET BY MOUTH EVERY DAY 90 tablet 0   blood glucose meter kit and supplies KIT Dispense based on patient and insurance preference. Use up to four times daily as directed. (FOR ICD-9 250.00, 250.01). 1 each 0   clonazePAM (KLONOPIN) 1 MG tablet Take 1 mg by mouth 2 (two) times daily as needed for anxiety.     clopidogrel (PLAVIX) 75 MG tablet TAKE ONE TABLET BY MOUTH EVERY DAY 90 tablet 1   Dulaglutide (TRULICITY) 4.5 UL/8.4TX SOPN Inject 4.5 mg into the skin once a week as directed. 2 mL 12   losartan (COZAAR) 100 MG tablet Take 1 tablet (100 mg total) by mouth once daily. Instead of 75 mg a day 90 tablet 4   metFORMIN (GLUCOPHAGE) 500 MG tablet Take 1 tablet (500 mg total) by mouth 2 (two) times daily with a meal. 180 tablet 4   metoprolol tartrate (LOPRESSOR) 25 MG tablet TAKE ONE TABLET BY MOUTH 2 TIMES A DAY 180 tablet 1   QUEtiapine (SEROQUEL) 200 MG tablet Take 1 tablet (200 mg total) by mouth once daily at bedtime. 90 tablet 1   QUEtiapine (SEROQUEL) 400 MG tablet Take one tablet by mouth once daily at bedtime. 90 tablet 1   rosuvastatin (CRESTOR) 20 MG tablet TAKE ONE TABLET BY MOUTH EVERY DAY 30 tablet 2   traMADol (ULTRAM) 50 MG tablet Take 50 mg by mouth 3 (three) times daily as needed for moderate pain.     traZODone (DESYREL) 100 MG tablet Take 2 tablets (200 mg total) by mouth once daily at bedtime. 180 tablet 1   No facility-administered medications prior to visit.    No Known  Allergies  ROS Review of Systems  Constitutional: Negative.   HENT:  Positive for hearing loss and tinnitus (chronic bilateral ear). Negative for ear discharge and ear pain.   Respiratory: Negative.    Cardiovascular: Negative.   Endocrine: Negative.   Neurological: Negative.   Psychiatric/Behavioral: Negative.       Objective:    Physical Exam HENT:     Right Ear: Decreased hearing noted. There is impacted cerumen.     Left Ear:  Decreased hearing noted. There is impacted cerumen.     Mouth/Throat:     Mouth: Mucous membranes are moist.  Eyes:     Extraocular Movements: Extraocular movements intact.     Conjunctiva/sclera: Conjunctivae normal.     Pupils: Pupils are equal, round, and reactive to light.  Cardiovascular:     Rate and Rhythm: Normal rate and regular rhythm.     Pulses: Normal pulses.     Heart sounds: Normal heart sounds.  Pulmonary:     Effort: Pulmonary effort is normal.     Breath sounds: Normal breath sounds.  Skin:    General: Skin is warm.  Neurological:     General: No focal deficit present.     Mental Status: He is alert and oriented to person, place, and time. Mental status is at baseline.  Psychiatric:        Mood and Affect: Mood normal.        Behavior: Behavior normal.        Thought Content: Thought content normal.        Judgment: Judgment normal.    BP (!) 151/84 (BP Location: Left Arm, Patient Position: Sitting, Cuff Size: Large)    Pulse 67    Temp 97.6 F (36.4 C)    Resp 16    Wt 217 lb 1.6 oz (98.5 kg)    BMI 33.01 kg/m  Wt Readings from Last 3 Encounters:  04/29/21 217 lb 1.6 oz (98.5 kg)  02/25/21 218 lb 8 oz (99.1 kg)  01/28/21 219 lb 1.6 oz (99.4 kg)   Encouraged weight loss  Health Maintenance Due  Topic Date Due   HIV Screening  Never done   Hepatitis C Screening  Never done   TETANUS/TDAP  Never done   Zoster Vaccines- Shingrix (1 of 2) Never done   COVID-19 Vaccine (2 - Moderna risk series) 08/16/2019    There  are no preventive care reminders to display for this patient.  Lab Results  Component Value Date   TSH 2.790 03/02/2018   Lab Results  Component Value Date   WBC 10.3 03/02/2018   HGB 17.7 03/02/2018   HCT 50.2 03/02/2018   MCV 86 03/02/2018   PLT 254 03/02/2018   Lab Results  Component Value Date   NA 142 10/22/2020   K 4.6 10/22/2020   CO2 20 10/22/2020   GLUCOSE 104 (H) 10/22/2020   BUN 6 10/22/2020   CREATININE 1.12 10/22/2020   BILITOT <0.2 10/22/2020   ALKPHOS 98 10/22/2020   AST 17 10/22/2020   ALT 18 10/22/2020   PROT 6.4 10/22/2020   ALBUMIN 4.2 10/22/2020   CALCIUM 8.8 10/22/2020   ANIONGAP 14 06/18/2015   EGFR 76 10/22/2020   Lab Results  Component Value Date   CHOL 129 12/17/2019   Lab Results  Component Value Date   HDL 34 (L) 12/17/2019   Lab Results  Component Value Date   LDLCALC 72 12/17/2019   Lab Results  Component Value Date   TRIG 127 12/17/2019   Lab Results  Component Value Date   CHOLHDL 3.8 12/17/2019   Lab Results  Component Value Date   HGBA1C 5.5 12/31/2020      Assessment & Plan:      1. Primary hypertension -His blood pressure was elevated during visit, he will continue on current medication, DASH diet and exercise as tolerated.  2. Bilateral impacted cerumen - He was advised to use  otc Debrox and return to the  clinic tomorrow for aural irrigation. Procedure was explained to him, and verbalized understanding.   Follow-up: Return in about 13 weeks (around 07/29/2021), or if symptoms worsen or fail to improve.    Johnae Friley Jerold Coombe, NP

## 2021-04-29 NOTE — Patient Instructions (Signed)

## 2021-05-13 ENCOUNTER — Other Ambulatory Visit: Payer: Self-pay

## 2021-05-18 ENCOUNTER — Other Ambulatory Visit: Payer: Self-pay | Admitting: Gerontology

## 2021-05-18 ENCOUNTER — Other Ambulatory Visit: Payer: Self-pay

## 2021-05-18 DIAGNOSIS — E785 Hyperlipidemia, unspecified: Secondary | ICD-10-CM

## 2021-05-19 ENCOUNTER — Other Ambulatory Visit: Payer: Self-pay

## 2021-05-19 MED FILL — Rosuvastatin Calcium Tab 20 MG: ORAL | 60 days supply | Qty: 60 | Fill #0 | Status: AC

## 2021-06-09 ENCOUNTER — Ambulatory Visit: Payer: Medicaid Other | Admitting: "Endocrinology

## 2021-06-09 ENCOUNTER — Other Ambulatory Visit: Payer: Self-pay

## 2021-06-09 VITALS — BP 163/96 | HR 59 | Wt 214.8 lb

## 2021-06-09 DIAGNOSIS — E119 Type 2 diabetes mellitus without complications: Secondary | ICD-10-CM

## 2021-06-09 NOTE — Progress Notes (Signed)
Follow up Diabetes/ Endocrine [facility]     Patient ID: Larry French, male   DOB: [birth year], 61 y.o.   MRN: [id number] Assessment:  Larry French is a 61 y.o. male who is seen in follow up for Diabetes mellitus without complication ([place]) [G81.8] at the request of [place], Chioma E, NP.  Encounter Diagnoses 1. Diabetes mellitus without complication [facility])     Assessment  Patient is a 61 year-old male with 2 to 3-year history of T2DM and other comorbidities and is currently at goal with treatment (A1c of 5.5 on 12/31/2020).   Plan:     T2DM -Maintain current drug regimen: metformin 500 mg BID, dulaglutide-Trulicity 4.5 mg weekly injection -Follow up for routine DM care in 3 months. -Follow up for routine DM lab: A1c  HTN -Follow up with PCP regarding medication adjustment. Consider the addition of HCTZ and chlortalidone for better control of patients HTN.    There are no Patient Instructions on file for this visit.   No orders of the defined types were placed in this encounter.    Subjective:  Patient is a 61 year-old male with a history of T2DM, HTN, hyperlipidemia, CAD, MI, alcohol abuse in recovery for almost 2 years, skin cancer, depression, panic attacks, PTSD, skin cancer, and smoking. He presents for routine DM follow-up.  Patient reports doing well managing his T2DM though he reports not measuring his BG regularly. He currently manages his DM with metformin 500 mg BID and dulaglutide-Trulicity 4.5 mg weekly injection. He denies any medication side effects, symptoms of hyperglycemia, peripheral neuropathy, and foot concerns. He reports some dizziness ~1x/week which resolves upon ingestion of Gatorade. He confirms that he has discontinued glipizide.  Patient reports following a Mediterranean diet (mostly fish and vegetables, though no nuts because of upper teeth issues). He reports portion control and maintaining a "1,000-calorie deficit" based on his  fitness watch.  He reports recent weight change (currently at 214.8 lbs, down from 235). He states that he smokes 1-1.25 packs of cigarettes/day and describes smoking in the yard as an "escape mechanism" to get away/take a break from his elderly parents in the house. He denies using recreational drug or consuming alcohol.     Review of [place]  has a past medical history of Alcohol abuse, Anxiety, CA - skin cancer, CAD, residual CFX LAD disease (06/01/2011), Depression, Headache(784.0), Hyperlipemia, Hypertension, Insomnia, Mental disorder, MI (myocardial infarction) ([place]), Polysubstance abuse ([place]), and PTSD (post-traumatic stress disorder).  Family History, Social History, current Medications and allergies reviewed and updated in Epic.  Objective:    Blood pressure (!) 163/96, pulse (!) 59, weight 214 lb 12.8 oz (97.4 kg), SpO2 95 %. Physical Exam Constitutional:      Appearance: Normal appearance.  Pulmonary:     Effort: Pulmonary effort is normal.  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   Lab Orders  No laboratory test(s) ordered today    HC Readings from Last 3 Encounters:  No data found for [facility]    Wt Readings from Last 3 Encounters:  06/09/21 214 lb 12.8 oz (97.4 kg)  04/29/21 217 lb 1.6 oz (98.5 kg)  02/25/21 218 lb 8 oz (99.1 kg)

## 2021-06-12 ENCOUNTER — Other Ambulatory Visit: Payer: Self-pay

## 2021-06-13 NOTE — Addendum Note (Signed)
Addended by: Waldon Reining on: 06/13/2021 03:26 PM   Modules accepted: Level of Service

## 2021-06-16 ENCOUNTER — Other Ambulatory Visit: Payer: Self-pay

## 2021-07-01 ENCOUNTER — Other Ambulatory Visit: Payer: Self-pay

## 2021-07-10 ENCOUNTER — Other Ambulatory Visit: Payer: Self-pay

## 2021-07-10 ENCOUNTER — Ambulatory Visit
Admission: RE | Admit: 2021-07-10 | Discharge: 2021-07-10 | Disposition: A | Discharge status: Home / Self Care | Payer: Self-pay | Source: Ambulatory Visit | Attending: Physician Assistant | Admitting: Physician Assistant

## 2021-07-10 ENCOUNTER — Other Ambulatory Visit: Payer: Self-pay | Admitting: Physician Assistant

## 2021-07-10 ENCOUNTER — Other Ambulatory Visit (HOSPITAL_COMMUNITY): Payer: Self-pay | Admitting: Physician Assistant

## 2021-07-10 DIAGNOSIS — S32010A Wedge compression fracture of first lumbar vertebra, initial encounter for closed fracture: Secondary | ICD-10-CM | POA: Insufficient documentation

## 2021-07-10 MED ORDER — GABAPENTIN 100 MG PO CAPS
100.0000 mg | ORAL_CAPSULE | Freq: Two times a day (BID) | ORAL | 1 refills | Status: DC
  Filled 2021-07-10: qty 60, 30d supply, fill #0
  Filled 2021-08-26: qty 60, 30d supply, fill #1
  Filled 2021-09-16: qty 60, 30d supply, fill #0
  Filled 2021-11-21: qty 60, 30d supply, fill #1

## 2021-07-13 ENCOUNTER — Other Ambulatory Visit: Payer: Self-pay

## 2021-07-29 ENCOUNTER — Ambulatory Visit: Payer: Medicaid Other | Admitting: Gerontology

## 2021-08-05 ENCOUNTER — Ambulatory Visit: Payer: Medicaid Other | Admitting: Gerontology

## 2021-08-05 ENCOUNTER — Other Ambulatory Visit: Payer: Self-pay | Admitting: Gerontology

## 2021-08-05 ENCOUNTER — Other Ambulatory Visit: Payer: Self-pay

## 2021-08-05 DIAGNOSIS — I10 Essential (primary) hypertension: Secondary | ICD-10-CM

## 2021-08-05 MED FILL — Rosuvastatin Calcium Tab 20 MG: ORAL | 30 days supply | Qty: 30 | Fill #1 | Status: AC

## 2021-08-11 ENCOUNTER — Other Ambulatory Visit: Payer: Self-pay

## 2021-08-20 ENCOUNTER — Other Ambulatory Visit: Payer: Self-pay

## 2021-08-20 ENCOUNTER — Other Ambulatory Visit: Payer: Self-pay | Admitting: Gerontology

## 2021-08-20 DIAGNOSIS — I10 Essential (primary) hypertension: Secondary | ICD-10-CM

## 2021-08-20 DIAGNOSIS — E785 Hyperlipidemia, unspecified: Secondary | ICD-10-CM

## 2021-08-20 MED FILL — Metoprolol Tartrate Tab 25 MG: ORAL | 30 days supply | Qty: 60 | Fill #0 | Status: AC

## 2021-08-20 MED FILL — Clopidogrel Bisulfate Tab 75 MG (Base Equiv): ORAL | 30 days supply | Qty: 30 | Fill #0 | Status: AC

## 2021-08-20 MED FILL — Rosuvastatin Calcium Tab 20 MG: ORAL | Qty: 30 | Fill #0 | Status: CN

## 2021-08-21 ENCOUNTER — Other Ambulatory Visit: Payer: Self-pay

## 2021-08-21 MED ORDER — QUETIAPINE FUMARATE 400 MG PO TABS
400.0000 mg | ORAL_TABLET | Freq: Every day | ORAL | 1 refills | Status: DC
  Filled 2021-08-21: qty 30, 30d supply, fill #0

## 2021-08-21 MED ORDER — TRAZODONE HCL 100 MG PO TABS
200.0000 mg | ORAL_TABLET | Freq: Every day | ORAL | 1 refills | Status: DC
  Filled 2021-08-21: qty 60, 30d supply, fill #0
  Filled 2021-09-14: qty 60, 30d supply, fill #1
  Filled 2021-09-15: qty 60, 30d supply, fill #0
  Filled 2021-10-09: qty 60, 30d supply, fill #1
  Filled 2021-11-21: qty 60, 30d supply, fill #2
  Filled 2021-12-23: qty 120, 60d supply, fill #3

## 2021-08-21 MED ORDER — QUETIAPINE FUMARATE 200 MG PO TABS
200.0000 mg | ORAL_TABLET | Freq: Every day | ORAL | 1 refills | Status: DC
  Filled 2021-08-21: qty 30, 30d supply, fill #0

## 2021-08-24 ENCOUNTER — Other Ambulatory Visit: Payer: Self-pay

## 2021-08-26 ENCOUNTER — Other Ambulatory Visit: Payer: Self-pay

## 2021-08-26 MED FILL — Rosuvastatin Calcium Tab 20 MG: ORAL | 30 days supply | Qty: 30 | Fill #0 | Status: CN

## 2021-08-27 ENCOUNTER — Other Ambulatory Visit: Payer: Self-pay

## 2021-08-28 ENCOUNTER — Other Ambulatory Visit: Payer: Self-pay

## 2021-09-01 ENCOUNTER — Other Ambulatory Visit: Payer: Self-pay

## 2021-09-01 ENCOUNTER — Ambulatory Visit: Payer: Medicaid Other | Admitting: "Endocrinology

## 2021-09-01 VITALS — BP 152/89 | HR 65 | Temp 98.2°F | Wt 200.7 lb

## 2021-09-01 DIAGNOSIS — E119 Type 2 diabetes mellitus without complications: Secondary | ICD-10-CM

## 2021-09-01 MED ORDER — CHLORTHALIDONE 25 MG PO TABS
25.0000 mg | ORAL_TABLET | Freq: Every day | ORAL | 3 refills | Status: DC
  Filled 2021-09-01 – 2021-09-16 (×2): qty 90, 90d supply, fill #0

## 2021-09-02 ENCOUNTER — Other Ambulatory Visit: Payer: Self-pay

## 2021-09-02 MED ORDER — QUETIAPINE FUMARATE 200 MG PO TABS
ORAL_TABLET | Freq: Every evening | ORAL | 1 refills | Status: DC
  Filled 2021-09-14: qty 90, fill #0
  Filled 2021-09-15: qty 30, 30d supply, fill #0
  Filled 2021-09-23: qty 90, 90d supply, fill #0
  Filled 2021-11-03: qty 90, 90d supply, fill #1

## 2021-09-02 MED ORDER — QUETIAPINE FUMARATE 400 MG PO TABS
ORAL_TABLET | Freq: Every evening | ORAL | 1 refills | Status: DC
  Filled 2021-09-16: qty 30, 30d supply, fill #0
  Filled 2021-10-11: qty 30, 30d supply, fill #1
  Filled 2021-11-03: qty 30, 30d supply, fill #2

## 2021-09-03 ENCOUNTER — Other Ambulatory Visit: Payer: Self-pay

## 2021-09-04 ENCOUNTER — Encounter: Payer: Self-pay | Admitting: "Endocrinology

## 2021-09-04 ENCOUNTER — Other Ambulatory Visit: Payer: Self-pay

## 2021-09-04 NOTE — Progress Notes (Signed)
Follow up Diabetes/ Endocrine [facility]     Patient ID: Larry French, male   DOB: [birth year], 61 y.o.   MRN: [id number] Assessment:  Larry French is a 61 y.o. male who is seen in follow up for No primary diagnosis found. at the request of Tracie Harrier, MD.  Encounter Diagnoses No diagnosis found.  Assessment  Patient is a 61 year-old male with 2 to 3-year history of T2DM and other comorbidities and is currently at goal with treatment (A1c of 5.9 on 07/29/2021).   Plan:     T2DM -Maintain current drug regimen: metformin 270 mg BID and Trulicity 4.5 mg weekly.  HTN -Add chlorthalidone 25 mg PRN if BP continuously over 130/80 (and continue current drug regimen).     There are no Patient Instructions on file for this visit.   No orders of the defined types were placed in this encounter.    Subjective:  Patient is a 60 year-old male with a history of T2DM, HTN, hyperlipidemia, CAD, MI, alcohol abuse in recovery for about 2 years, skin cancer, depression, panic attacks, PTSD, skin cancer, and smoking. He presents for routine DM follow-up.   Patient reports doing well managing his T2DM though he reports no longer measuring his BG. He currently manages his DM with metformin 500 mg BID and dulaglutide-Trulicity 4.5 mg weekly injection. He denies any medication side effects, symptoms of hypoglycemia, hyperglycemia, peripheral neuropathy, and foot concerns. He reports BP ~140/70 when measured at home and states that he has not been taking gabapentin for the past month.   Patient reports that he is going through some difficult times as he recently lost his mother 2 weeks ago. His mother died of renal failure after 1 day in hospice care. Patient states that he was very close to his mother with whom he lived along with his father. He states that it's "just me and dad" as he lost his brother in a car crash in 2016. He reports finding social support in his church. He continues  to report smoking cigarettes, with a slight recent increase from 1.25 to 1.50 packs a day as smoking provides an "escape." Patient continues to state that he is not ready to quit but is willing to revisit potential smoking cessation at each visit and can try to work to reduce his smoking for the next visit.  He denies using recreational drug or consuming alcohol.  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 He continues to report a "1,000-calorie deficit" each day. He states that he often skips breakfast and lunch, and occasionally has unsweetened decaff tea and 1-2 bananas in the morning. He reports that his dinner consists mostly of baked chicken, beans, potatoes, salad.  He occasionally has a burger or sandwich around 3 PM if no breakfast or lunch. He reports continued weight loss. He exercises around the house (gardening...); he notes some bruises on legs from roses in his garden, but he states that they heal well. He also reports having a music room where he can decompress.        Review of Systems  Endocrine: Negative for polydipsia and polyuria.   [name]  has a past medical history of Alcohol abuse, Anxiety, CA - skin cancer, CAD, residual CFX LAD disease (06/01/2011), Depression, Headache(784.0), Hyperlipemia, Hypertension, Insomnia, Mental disorder, MI (myocardial infarction) ([place]), Polysubstance abuse ([place]), and  PTSD (post-traumatic stress disorder).  Family History, Social History, current Medications and allergies reviewed and updated in Epic.  Objective:    Blood pressure (!) 152/89, pulse 65, temperature 98.2 F (36.8 C), temperature source Oral, weight 200 lb 11.2 oz (91 kg), SpO2 97 %. Physical Exam Pulmonary:     Effort: Pulmonary effort is normal.  Musculoskeletal:        General: No swelling.  Neurological:     Mental Status: He is alert.   Psychiatric:        Behavior: Behavior normal.  (Tearful when talking about his mom passing away)      Data : I have personally reviewed pertinent labs and imaging studies, if indicated,  with the patient in clinic today.   Lab Orders  No laboratory test(s) ordered today    HC Readings from Last 3 Encounters:  No data found for [facility]    Wt Readings from Last 3 Encounters:  09/01/21 200 lb 11.2 oz (91 kg)  06/09/21 214 lb 12.8 oz (97.4 kg)  04/29/21 217 lb 1.6 oz (98.5 kg)

## 2021-09-08 ENCOUNTER — Other Ambulatory Visit: Payer: Self-pay

## 2021-09-09 ENCOUNTER — Other Ambulatory Visit: Payer: Self-pay

## 2021-09-14 ENCOUNTER — Other Ambulatory Visit: Payer: Self-pay | Admitting: Gerontology

## 2021-09-14 DIAGNOSIS — I10 Essential (primary) hypertension: Secondary | ICD-10-CM

## 2021-09-14 MED FILL — Rosuvastatin Calcium Tab 20 MG: ORAL | Qty: 30 | Fill #0 | Status: CN

## 2021-09-15 ENCOUNTER — Other Ambulatory Visit: Payer: Self-pay

## 2021-09-15 MED ORDER — CLOPIDOGREL BISULFATE 75 MG PO TABS
ORAL_TABLET | Freq: Every day | ORAL | 0 refills | Status: DC
  Filled 2021-09-15: qty 30, fill #0
  Filled 2021-09-16: qty 30, 30d supply, fill #0

## 2021-09-15 MED FILL — Rosuvastatin Calcium Tab 20 MG: ORAL | 30 days supply | Qty: 30 | Fill #0 | Status: AC

## 2021-09-16 ENCOUNTER — Other Ambulatory Visit: Payer: Self-pay

## 2021-09-22 ENCOUNTER — Other Ambulatory Visit: Payer: Self-pay

## 2021-09-23 ENCOUNTER — Other Ambulatory Visit: Payer: Self-pay

## 2021-09-23 MED ORDER — TRULICITY 4.5 MG/0.5ML ~~LOC~~ SOAJ
SUBCUTANEOUS | 3 refills | Status: DC
  Filled 2021-09-23: qty 8, 112d supply, fill #0
  Filled 2022-01-11: qty 8, 112d supply, fill #1
  Filled 2022-06-01: qty 8, 112d supply, fill #2

## 2021-10-02 ENCOUNTER — Other Ambulatory Visit: Payer: Self-pay

## 2021-10-09 ENCOUNTER — Other Ambulatory Visit: Payer: Self-pay

## 2021-10-11 ENCOUNTER — Other Ambulatory Visit: Payer: Self-pay | Admitting: Gerontology

## 2021-10-11 ENCOUNTER — Other Ambulatory Visit: Payer: Self-pay

## 2021-10-11 DIAGNOSIS — E785 Hyperlipidemia, unspecified: Secondary | ICD-10-CM

## 2021-10-11 DIAGNOSIS — I10 Essential (primary) hypertension: Secondary | ICD-10-CM

## 2021-10-12 ENCOUNTER — Other Ambulatory Visit: Payer: Self-pay

## 2021-10-13 ENCOUNTER — Other Ambulatory Visit: Payer: Self-pay

## 2021-10-13 ENCOUNTER — Other Ambulatory Visit: Payer: Self-pay | Admitting: Gerontology

## 2021-10-13 DIAGNOSIS — I10 Essential (primary) hypertension: Secondary | ICD-10-CM

## 2021-10-13 DIAGNOSIS — E785 Hyperlipidemia, unspecified: Secondary | ICD-10-CM

## 2021-10-15 ENCOUNTER — Other Ambulatory Visit: Payer: Self-pay

## 2021-10-19 ENCOUNTER — Other Ambulatory Visit: Payer: Self-pay

## 2021-10-21 ENCOUNTER — Other Ambulatory Visit: Payer: Self-pay

## 2021-10-23 ENCOUNTER — Other Ambulatory Visit: Payer: Self-pay

## 2021-10-25 ENCOUNTER — Other Ambulatory Visit: Payer: Self-pay

## 2021-10-27 ENCOUNTER — Other Ambulatory Visit: Payer: Self-pay

## 2021-10-29 ENCOUNTER — Other Ambulatory Visit: Payer: Self-pay

## 2021-11-03 ENCOUNTER — Other Ambulatory Visit: Payer: Self-pay

## 2021-11-04 ENCOUNTER — Other Ambulatory Visit: Payer: Self-pay

## 2021-11-05 ENCOUNTER — Other Ambulatory Visit: Payer: Self-pay

## 2021-11-05 MED ORDER — QUETIAPINE FUMARATE 400 MG PO TABS
ORAL_TABLET | ORAL | 5 refills | Status: DC
  Filled 2021-11-13 – 2021-11-18 (×2): qty 30, 30d supply, fill #0
  Filled 2021-12-13 – 2021-12-16 (×3): qty 30, 30d supply, fill #1
  Filled 2022-01-15: qty 30, 30d supply, fill #2

## 2021-11-05 MED ORDER — QUETIAPINE FUMARATE 200 MG PO TABS
ORAL_TABLET | ORAL | 5 refills | Status: DC
  Filled 2021-12-13 – 2021-12-14 (×2): qty 30, fill #0
  Filled 2021-12-17: qty 30, 30d supply, fill #0
  Filled 2022-01-15: qty 30, 30d supply, fill #1

## 2021-11-10 ENCOUNTER — Other Ambulatory Visit: Payer: Self-pay

## 2021-11-11 ENCOUNTER — Other Ambulatory Visit: Payer: Self-pay

## 2021-11-11 ENCOUNTER — Encounter: Payer: Self-pay | Admitting: Gerontology

## 2021-11-11 ENCOUNTER — Ambulatory Visit: Payer: Medicaid Other | Admitting: Gerontology

## 2021-11-11 VITALS — BP 178/81 | HR 72 | Temp 97.9°F | Resp 18 | Ht 68.0 in | Wt 184.7 lb

## 2021-11-11 DIAGNOSIS — F411 Generalized anxiety disorder: Secondary | ICD-10-CM

## 2021-11-11 DIAGNOSIS — I10 Essential (primary) hypertension: Secondary | ICD-10-CM

## 2021-11-11 MED ORDER — METOPROLOL TARTRATE 25 MG PO TABS
ORAL_TABLET | Freq: Two times a day (BID) | ORAL | 1 refills | Status: DC
  Filled 2021-11-11: qty 120, 60d supply, fill #0
  Filled 2021-11-21: qty 120, 60d supply, fill #1
  Filled 2022-04-15: qty 60, 30d supply, fill #1
  Filled 2022-04-16: qty 120, 60d supply, fill #1

## 2021-11-11 NOTE — Patient Instructions (Signed)

## 2021-11-11 NOTE — Progress Notes (Signed)
Established Patient Office Visit  Subjective   Patient ID: Larry French, male    DOB: 25-Oct-1960  Age: 61 y.o. MRN: [id number]  Chief Complaint  Patient presents with   Follow-up    General follow up/ been depressed since mom passed away    HPI CLEBURNE SAVINI is a 61 y/o male with PMH of Alcohol abuse, T2DM, Anxiety, CA-skin cancer, CAD, Depression, Hyperlipidemia, Hypertension, presents for follow up visit. He states that he's compliant with his medications, denies side effects and continues to make healthy lifestyle changes. His blood pressure was elevated during visit at 178/81, he states that he's being depressed since his mother died 2 months ago, having crying spells and caregiver for his Dad. He denies chest pain, palpitation, light headedness, vision changes, suicidal nor homicidal ideation. He was seen by Dr Marcello Fennel at [place] [facility] on 11/10/21 for Insomnia, he was prescribed 200 mg of Seroquel and 400 mg Seroquel at bedtime, and continue on Clonazepam 1 mg tid. He states that he will continue to follow up at [facility] for his mental health and offers no further complaint.   Review of Systems  Constitutional: Negative.   HENT: Negative.    Eyes: Negative.   Respiratory: Negative.    Cardiovascular: Negative.   Neurological: Negative.   Psychiatric/Behavioral:  Positive for depression.       Objective:     BP (!) 178/81 (BP Location: Right Arm, Patient Position: Sitting, Cuff Size: Large)   Pulse 72   Temp 97.9 F (36.6 C) (Oral)   Resp 18   Ht 5\' 8"  (1.727 m)   Wt 184 lb 11.2 oz (83.8 kg)   SpO2 95%   BMI 28.08 kg/m  BP Readings from Last 3 Encounters:  11/11/21 (!) 178/81  09/01/21 (!) 152/89  06/09/21 (!) 163/96   Wt Readings from Last 3 Encounters:  11/11/21 184 lb 11.2 oz (83.8 kg)  09/01/21 200 lb 11.2 oz (91 kg)  06/09/21 214 lb 12.8 oz (97.4 kg)      Physical Exam HENT:     Head: Normocephalic.     Mouth/Throat:     Mouth:  Mucous membranes are moist.  Eyes:     Extraocular Movements: Extraocular movements intact.     Conjunctiva/sclera: Conjunctivae normal.     Pupils: Pupils are equal, round, and reactive to light.  Cardiovascular:     Rate and Rhythm: Normal rate and regular rhythm.     Pulses: Normal pulses.     Heart sounds: Normal heart sounds.  Pulmonary:     Effort: Pulmonary effort is normal.     Breath sounds: Normal breath sounds.  Neurological:     General: No focal deficit present.     Mental Status: He is alert and oriented to person, place, and time. Mental status is at baseline.  Psychiatric:        Mood and Affect: Mood normal.        Behavior: Behavior normal.        Thought Content: Thought content normal.        Judgment: Judgment normal.      No results found for any visits on 11/11/21.  Last CBC Lab Results  Component Value Date   WBC 10.3 03/02/2018   HGB 17.7 03/02/2018   HCT 50.2 03/02/2018   MCV 86 03/02/2018   MCH 30.5 03/02/2018   RDW 13.0 03/02/2018   PLT 254 03/02/2018   Last metabolic panel Lab Results  Component  Value Date   GLUCOSE 104 (H) 10/22/2020   NA 142 10/22/2020   K 4.6 10/22/2020   CL 106 10/22/2020   CO2 20 10/22/2020   BUN 6 10/22/2020   CREATININE 1.12 10/22/2020   EGFR 76 10/22/2020   CALCIUM 8.8 10/22/2020   PROT 6.4 10/22/2020   ALBUMIN 4.2 10/22/2020   LABGLOB 2.2 10/22/2020   AGRATIO 1.9 10/22/2020   BILITOT <0.2 10/22/2020   ALKPHOS 98 10/22/2020   AST 17 10/22/2020   ALT 18 10/22/2020   ANIONGAP 14 06/18/2015   Last lipids Lab Results  Component Value Date   CHOL 129 12/17/2019   HDL 34 (L) 12/17/2019   LDLCALC 72 12/17/2019   TRIG 127 12/17/2019   CHOLHDL 3.8 12/17/2019   Last hemoglobin A1c Lab Results  Component Value Date   HGBA1C 5.5 12/31/2020      The ASCVD Risk score (Arnett DK, et al., 2019) failed to calculate for the following reasons:   The patient has a prior MI or stroke diagnosis     Assessment & Plan:   1. Essential hypertension -His blood pressure is not under control, his goal should be less than 140/90.  He will continue on current medication, DASH diet and exercise as tolerated.  He was educated on the signs and symptoms of stroke and advised to go to the emergency room.  He will check his blood pressure every other day, record and bring log to follow-up appointment. - metoprolol tartrate (LOPRESSOR) 25 MG tablet; TAKE ONE TABLET BY MOUTH 2 TIMES A DAY  Dispense: 120 tablet; Refill: 1  2. GAD (generalized anxiety disorder) -He will continue on current medication as prescribed at [facility] clinic, was advised to call the crisis helpline with worsening symptoms.   Return in about 3 months (around 02/11/2022), or if symptoms worsen or fail to improve.    Emmanuella Mirante Jerold Coombe, NP

## 2021-11-13 ENCOUNTER — Other Ambulatory Visit: Payer: Self-pay

## 2021-11-18 ENCOUNTER — Other Ambulatory Visit: Payer: Self-pay

## 2021-11-21 ENCOUNTER — Other Ambulatory Visit: Payer: Self-pay | Admitting: Gerontology

## 2021-11-21 DIAGNOSIS — I10 Essential (primary) hypertension: Secondary | ICD-10-CM

## 2021-11-21 DIAGNOSIS — E785 Hyperlipidemia, unspecified: Secondary | ICD-10-CM

## 2021-11-23 ENCOUNTER — Other Ambulatory Visit: Payer: Self-pay

## 2021-11-23 ENCOUNTER — Other Ambulatory Visit: Payer: Self-pay | Admitting: Gerontology

## 2021-11-23 DIAGNOSIS — E785 Hyperlipidemia, unspecified: Secondary | ICD-10-CM

## 2021-11-23 DIAGNOSIS — I10 Essential (primary) hypertension: Secondary | ICD-10-CM

## 2021-11-24 ENCOUNTER — Other Ambulatory Visit: Payer: Self-pay

## 2021-11-24 MED FILL — Rosuvastatin Calcium Tab 20 MG: ORAL | 30 days supply | Qty: 30 | Fill #0 | Status: AC

## 2021-11-24 MED FILL — Clopidogrel Bisulfate Tab 75 MG (Base Equiv): ORAL | 30 days supply | Qty: 30 | Fill #0 | Status: AC

## 2021-12-01 ENCOUNTER — Ambulatory Visit: Payer: Medicaid Other

## 2021-12-01 ENCOUNTER — Other Ambulatory Visit: Payer: Self-pay

## 2021-12-02 ENCOUNTER — Other Ambulatory Visit: Payer: Self-pay

## 2021-12-03 ENCOUNTER — Other Ambulatory Visit: Payer: Self-pay

## 2021-12-13 ENCOUNTER — Other Ambulatory Visit: Payer: Self-pay

## 2021-12-14 ENCOUNTER — Other Ambulatory Visit: Payer: Self-pay

## 2021-12-15 ENCOUNTER — Other Ambulatory Visit: Payer: Self-pay

## 2021-12-16 ENCOUNTER — Other Ambulatory Visit: Payer: Self-pay

## 2021-12-17 ENCOUNTER — Other Ambulatory Visit: Payer: Self-pay

## 2021-12-19 ENCOUNTER — Other Ambulatory Visit: Payer: Self-pay | Admitting: Endocrinology

## 2021-12-22 ENCOUNTER — Other Ambulatory Visit: Payer: Self-pay

## 2021-12-22 MED ORDER — METFORMIN HCL 500 MG PO TABS
500.0000 mg | ORAL_TABLET | Freq: Two times a day (BID) | ORAL | 0 refills | Status: DC
  Filled 2021-12-22: qty 180, 90d supply, fill #0

## 2021-12-23 ENCOUNTER — Other Ambulatory Visit: Payer: Self-pay | Admitting: Gerontology

## 2021-12-23 ENCOUNTER — Other Ambulatory Visit: Payer: Self-pay

## 2021-12-23 DIAGNOSIS — E785 Hyperlipidemia, unspecified: Secondary | ICD-10-CM

## 2021-12-23 MED ORDER — TRAZODONE HCL 100 MG PO TABS
ORAL_TABLET | ORAL | 1 refills | Status: DC
  Filled 2021-12-23: qty 180, 90d supply, fill #0
  Filled 2022-04-05: qty 180, 90d supply, fill #1

## 2021-12-23 MED ORDER — GABAPENTIN 300 MG PO CAPS
ORAL_CAPSULE | ORAL | 0 refills | Status: DC
  Filled 2021-12-23: qty 180, 90d supply, fill #0

## 2021-12-23 MED ORDER — METFORMIN HCL 500 MG PO TABS
ORAL_TABLET | ORAL | 1 refills | Status: DC
  Filled 2021-12-23: qty 90, fill #0
  Filled 2021-12-24: qty 90, 90d supply, fill #0

## 2021-12-24 ENCOUNTER — Other Ambulatory Visit: Payer: Self-pay

## 2021-12-24 MED ORDER — ROSUVASTATIN CALCIUM 20 MG PO TABS
ORAL_TABLET | Freq: Every day | ORAL | 1 refills | Status: DC
  Filled 2021-12-24: qty 30, 30d supply, fill #0
  Filled 2022-01-07 – 2022-01-29 (×2): qty 30, 30d supply, fill #1

## 2021-12-25 ENCOUNTER — Other Ambulatory Visit: Payer: Self-pay

## 2021-12-28 ENCOUNTER — Other Ambulatory Visit: Payer: Self-pay

## 2021-12-28 MED ORDER — TRULICITY 4.5 MG/0.5ML ~~LOC~~ SOAJ
SUBCUTANEOUS | 3 refills | Status: DC
Start: 2021-12-02 — End: 2022-02-11
  Filled 2021-12-28: qty 8, 112d supply, fill #0

## 2022-01-07 ENCOUNTER — Other Ambulatory Visit: Payer: Self-pay | Admitting: Gerontology

## 2022-01-07 ENCOUNTER — Other Ambulatory Visit: Payer: Self-pay

## 2022-01-07 DIAGNOSIS — I10 Essential (primary) hypertension: Secondary | ICD-10-CM

## 2022-01-07 MED ORDER — CLOPIDOGREL BISULFATE 75 MG PO TABS
ORAL_TABLET | Freq: Every day | ORAL | 1 refills | Status: DC
  Filled 2022-01-07: qty 30, 30d supply, fill #0
  Filled 2022-02-01: qty 30, 30d supply, fill #1

## 2022-01-11 ENCOUNTER — Other Ambulatory Visit: Payer: Self-pay

## 2022-01-11 ENCOUNTER — Other Ambulatory Visit: Payer: Self-pay | Admitting: Endocrinology

## 2022-01-11 DIAGNOSIS — I25118 Atherosclerotic heart disease of native coronary artery with other forms of angina pectoris: Secondary | ICD-10-CM

## 2022-01-11 DIAGNOSIS — E119 Type 2 diabetes mellitus without complications: Secondary | ICD-10-CM

## 2022-01-12 ENCOUNTER — Other Ambulatory Visit: Payer: Self-pay

## 2022-01-12 MED ORDER — TRULICITY 4.5 MG/0.5ML ~~LOC~~ SOAJ
4.5000 mg | SUBCUTANEOUS | 1 refills | Status: DC
  Filled 2022-01-12 – 2022-12-27 (×2): qty 2, 28d supply, fill #0

## 2022-01-12 MED ORDER — LOSARTAN POTASSIUM 100 MG PO TABS
100.0000 mg | ORAL_TABLET | Freq: Every day | ORAL | 0 refills | Status: DC
  Filled 2022-01-12: qty 90, 90d supply, fill #0

## 2022-01-15 ENCOUNTER — Other Ambulatory Visit: Payer: Self-pay

## 2022-01-15 MED ORDER — QUETIAPINE FUMARATE 400 MG PO TABS
400.0000 mg | ORAL_TABLET | Freq: Every day | ORAL | 3 refills | Status: DC
  Filled 2022-02-12: qty 25, 25d supply, fill #0
  Filled 2022-02-12: qty 5, 5d supply, fill #0
  Filled 2022-03-16: qty 30, 30d supply, fill #1
  Filled 2022-04-05 – 2022-04-13 (×2): qty 30, 30d supply, fill #2
  Filled 2022-07-26: qty 30, 30d supply, fill #3

## 2022-01-15 MED ORDER — QUETIAPINE FUMARATE 200 MG PO TABS
200.0000 mg | ORAL_TABLET | Freq: Every evening | ORAL | 3 refills | Status: DC | PRN
  Filled 2022-02-12: qty 30, 30d supply, fill #0
  Filled 2022-03-16: qty 30, 30d supply, fill #1
  Filled 2022-04-05 – 2022-04-13 (×2): qty 30, 30d supply, fill #2

## 2022-01-26 ENCOUNTER — Other Ambulatory Visit: Payer: Self-pay

## 2022-02-01 ENCOUNTER — Other Ambulatory Visit: Payer: Self-pay

## 2022-02-11 ENCOUNTER — Other Ambulatory Visit: Payer: Self-pay

## 2022-02-11 ENCOUNTER — Encounter: Payer: Self-pay | Admitting: Gerontology

## 2022-02-11 ENCOUNTER — Ambulatory Visit: Payer: Medicaid Other | Admitting: Gerontology

## 2022-02-11 VITALS — BP 132/85 | HR 78 | Temp 98.2°F | Wt 197.0 lb

## 2022-02-11 DIAGNOSIS — I10 Essential (primary) hypertension: Secondary | ICD-10-CM

## 2022-02-11 DIAGNOSIS — E119 Type 2 diabetes mellitus without complications: Secondary | ICD-10-CM

## 2022-02-11 DIAGNOSIS — Z720 Tobacco use: Secondary | ICD-10-CM

## 2022-02-11 DIAGNOSIS — Z23 Encounter for immunization: Secondary | ICD-10-CM

## 2022-02-11 DIAGNOSIS — E785 Hyperlipidemia, unspecified: Secondary | ICD-10-CM

## 2022-02-11 MED ORDER — ROSUVASTATIN CALCIUM 20 MG PO TABS
ORAL_TABLET | Freq: Every day | ORAL | 1 refills | Status: DC
  Filled 2022-02-11: qty 90, fill #0
  Filled 2022-03-16: qty 30, 30d supply, fill #0
  Filled 2022-04-15 – 2022-04-16 (×2): qty 30, 30d supply, fill #1
  Filled 2022-05-15: qty 30, 30d supply, fill #2
  Filled 2022-07-26: qty 30, 30d supply, fill #3

## 2022-02-11 MED ORDER — LOSARTAN POTASSIUM 100 MG PO TABS
100.0000 mg | ORAL_TABLET | Freq: Every day | ORAL | 1 refills | Status: DC
  Filled 2022-02-11: qty 90, 90d supply, fill #0
  Filled 2022-04-15: qty 30, 30d supply, fill #0
  Filled 2022-04-16: qty 90, 90d supply, fill #0
  Filled 2022-05-15: qty 30, 30d supply, fill #1
  Filled 2022-07-26: qty 90, 90d supply, fill #1

## 2022-02-11 MED ORDER — CLOPIDOGREL BISULFATE 75 MG PO TABS
ORAL_TABLET | Freq: Every day | ORAL | 5 refills | Status: DC
  Filled 2022-02-11: qty 30, fill #0
  Filled 2022-03-16: qty 30, 30d supply, fill #0
  Filled 2022-04-15 – 2022-04-16 (×2): qty 30, 30d supply, fill #1
  Filled 2022-05-15 – 2022-06-01 (×2): qty 30, 30d supply, fill #2

## 2022-02-11 NOTE — Patient Instructions (Signed)
Carbohydrate Counting for Diabetes Mellitus, Adult Carbohydrate counting is a method of keeping track of how many carbohydrates you eat. Eating carbohydrates increases the amount of sugar (glucose) in the blood. Counting how many carbohydrates you eat improves how well you manage your blood glucose. This, in turn, helps you manage your diabetes. Carbohydrates are measured in grams (g) per serving. It is important to know how many carbohydrates (in grams or by serving size) you can have in each meal. This is different for every person. A dietitian can help you make a meal plan and calculate how many carbohydrates you should have at each meal and snack. What foods contain carbohydrates? Carbohydrates are found in the following foods: Grains, such as breads and cereals. Dried beans and soy products. Starchy vegetables, such as potatoes, peas, and corn. Fruit and fruit juices. Milk and yogurt. Sweets and snack foods, such as cake, cookies, candy, chips, and soft drinks. How do I count carbohydrates in foods? There are two ways to count carbohydrates in food. You can read food labels or learn standard serving sizes of foods. You can use either of these methods or a combination of both. Using the Nutrition Facts label The Nutrition Facts list is included on the labels of almost all packaged foods and beverages in the [place]. It includes: The serving size. Information about nutrients in each serving, including the grams of carbohydrate per serving. To use the Nutrition Facts, decide how many servings you will have. Then, multiply the number of servings by the number of carbohydrates per serving. The resulting number is the total grams of carbohydrates that you will be having. Learning the standard serving sizes of foods When you eat carbohydrate foods that are not packaged or do not include Nutrition Facts on the label, you need to measure the servings in order to count the grams of  carbohydrates. Measure the foods that you will eat with a food scale or measuring cup, if needed. Decide how many standard-size servings you will eat. Multiply the number of servings by 15. For foods that contain carbohydrates, one serving equals 15 g of carbohydrates. For example, if you eat 2 cups or 10 oz (300 g) of strawberries, you will have eaten 2 servings and 30 g of carbohydrates (2 servings x 15 g = 30 g). For foods that have more than one food mixed, such as soups and casseroles, you must count the carbohydrates in each food that is included. The following list contains standard serving sizes of common carbohydrate-rich foods. Each of these servings has about 15 g of carbohydrates: 1 slice of bread. 1 six-inch (15 cm) tortilla. ? cup or 2 oz (53 g) cooked rice or pasta.  cup or 3 oz (85 g) cooked or canned, drained and rinsed beans or lentils.  cup or 3 oz (85 g) starchy vegetable, such as peas, corn, or squash.  cup or 4 oz (120 g) hot cereal.  cup or 3 oz (85 g) boiled or mashed potatoes, or  or 3 oz (85 g) of a large baked potato.  cup or 4 fl oz (118 mL) fruit juice. 1 cup or 8 fl oz (237 mL) milk. 1 small or 4 oz (106 g) apple.  or 2 oz (63 g) of a medium banana. 1 cup or 5 oz (150 g) strawberries. 3 cups or 1 oz (28.3 g) popped popcorn. What is an example of carbohydrate counting? To calculate the grams of carbohydrates in this sample meal, follow the steps   shown below. Sample meal 3 oz (85 g) chicken breast. ? cup or 4 oz (106 g) brown rice.  cup or 3 oz (85 g) corn. 1 cup or 8 fl oz (237 mL) milk. 1 cup or 5 oz (150 g) strawberries with sugar-free whipped topping. Carbohydrate calculation Identify the foods that contain carbohydrates: Rice. Corn. Milk. Strawberries. Calculate how many servings you have of each food: 2 servings rice. 1 serving corn. 1 serving milk. 1 serving strawberries. Multiply each number of servings by 15 g: 2 servings rice x 15  g = 30 g. 1 serving corn x 15 g = 15 g. 1 serving milk x 15 g = 15 g. 1 serving strawberries x 15 g = 15 g. Add together all of the amounts to find the total grams of carbohydrates eaten: 30 g + 15 g + 15 g + 15 g = 75 g of carbohydrates total. What are tips for following this plan? Shopping Develop a meal plan and then make a shopping list. Buy fresh and frozen vegetables, fresh and frozen fruit, dairy, eggs, beans, lentils, and whole grains. Look at food labels. Choose foods that have more fiber and less sugar. Avoid processed foods and foods with added sugars. Meal planning Aim to have the same number of grams of carbohydrates at each meal and for each snack time. Plan to have regular, balanced meals and snacks. Where to find more information American Diabetes Association: diabetes.org Centers for Disease Control and Prevention: cdc.gov Academy of Nutrition and Dietetics: eatright.org Association of Diabetes Care & Education Specialists: diabeteseducator.org Summary Carbohydrate counting is a method of keeping track of how many carbohydrates you eat. Eating carbohydrates increases the amount of sugar (glucose) in your blood. Counting how many carbohydrates you eat improves how well you manage your blood glucose. This helps you manage your diabetes. A dietitian can help you make a meal plan and calculate how many carbohydrates you should have at each meal and snack. This information is not intended to replace advice given to you by your health care provider. Make sure you discuss any questions you have with your health care provider. Document Revised: 11/07/2019 Document Reviewed: 11/07/2019 Elsevier Patient Education  2023 Elsevier Inc. DASH Eating Plan DASH stands for Dietary Approaches to Stop Hypertension. The DASH eating plan is a healthy eating plan that has been shown to: Reduce high blood pressure (hypertension). Reduce your risk for type 2 diabetes, heart disease, and  stroke. Help with weight loss. What are tips for following this plan? Reading food labels Check food labels for the amount of salt (sodium) per serving. Choose foods with less than 5 percent of the Daily Value of sodium. Generally, foods with less than 300 milligrams (mg) of sodium per serving fit into this eating plan. To find whole grains, look for the word "whole" as the first word in the ingredient list. Shopping Buy products labeled as "low-sodium" or "no salt added." Buy fresh foods. Avoid canned foods and pre-made or frozen meals. Cooking Avoid adding salt when cooking. Use salt-free seasonings or herbs instead of table salt or sea salt. Check with your health care provider or pharmacist before using salt substitutes. Do not fry foods. Cook foods using healthy methods such as baking, boiling, grilling, roasting, and broiling instead. Cook with heart-healthy oils, such as olive, canola, avocado, soybean, or sunflower oil. Meal planning  Eat a balanced diet that includes: 4 or more servings of fruits and 4 or more servings of vegetables each day.   Try to fill one-half of your plate with fruits and vegetables. 6-8 servings of whole grains each day. Less than 6 oz (170 g) of lean meat, poultry, or fish each day. A 3-oz (85-g) serving of meat is about the same size as a deck of cards. One egg equals 1 oz (28 g). 2-3 servings of low-fat dairy each day. One serving is 1 cup (237 mL). 1 serving of nuts, seeds, or beans 5 times each week. 2-3 servings of heart-healthy fats. Healthy fats called omega-3 fatty acids are found in foods such as walnuts, flaxseeds, fortified milks, and eggs. These fats are also found in cold-water fish, such as sardines, salmon, and mackerel. Limit how much you eat of: Canned or prepackaged foods. Food that is high in trans fat, such as some fried foods. Food that is high in saturated fat, such as fatty meat. Desserts and other sweets, sugary drinks, and other foods  with added sugar. Full-fat dairy products. Do not salt foods before eating. Do not eat more than 4 egg yolks a week. Try to eat at least 2 vegetarian meals a week. Eat more home-cooked food and less restaurant, buffet, and fast food. Lifestyle When eating at a restaurant, ask that your food be prepared with less salt or no salt, if possible. If you drink alcohol: Limit how much you use to: 0-1 drink a day for women who are not pregnant. 0-2 drinks a day for men. Be aware of how much alcohol is in your drink. In the [place] one drink equals one 12 oz bottle of beer (355 mL), one 5 oz glass of wine (148 mL), or one 1 oz glass of hard liquor (44 mL). General information Avoid eating more than 2,300 mg of salt a day. If you have hypertension, you may need to reduce your sodium intake to 1,500 mg a day. Work with your health care provider to maintain a healthy body weight or to lose weight. Ask what an ideal weight is for you. Get at least 30 minutes of exercise that causes your heart to beat faster (aerobic exercise) most days of the week. Activities may include walking, swimming, or biking. Work with your health care provider or dietitian to adjust your eating plan to your individual calorie needs. What foods should I eat? Fruits All fresh, dried, or frozen fruit. Canned fruit in natural juice (without added sugar). Vegetables Fresh or frozen vegetables (raw, steamed, roasted, or grilled). Low-sodium or reduced-sodium tomato and vegetable juice. Low-sodium or reduced-sodium tomato sauce and tomato paste. Low-sodium or reduced-sodium canned vegetables. Grains Whole-grain or whole-wheat bread. Whole-grain or whole-wheat pasta. Brown rice. Oatmeal. Quinoa. Bulgur. Whole-grain and low-sodium cereals. Pita bread. Low-fat, low-sodium crackers. Whole-wheat flour tortillas. Meats and other proteins Skinless chicken or turkey. Ground chicken or turkey. Pork with fat trimmed off. Fish and seafood. Egg  whites. Dried beans, peas, or lentils. Unsalted nuts, nut butters, and seeds. Unsalted canned beans. Lean cuts of beef with fat trimmed off. Low-sodium, lean precooked or cured meat, such as sausages or meat loaves. Dairy Low-fat (1%) or fat-free (skim) milk. Reduced-fat, low-fat, or fat-free cheeses. Nonfat, low-sodium ricotta or cottage cheese. Low-fat or nonfat yogurt. Low-fat, low-sodium cheese. Fats and oils Soft margarine without trans fats. Vegetable oil. Reduced-fat, low-fat, or light mayonnaise and salad dressings (reduced-sodium). Canola, safflower, olive, avocado, soybean, and sunflower oils. Avocado. Seasonings and condiments Herbs. Spices. Seasoning mixes without salt. Other foods Unsalted popcorn and pretzels. Fat-free sweets. The items listed above may not be   a complete list of foods and beverages you can eat. Contact a dietitian for more information. What foods should I avoid? Fruits Canned fruit in a light or heavy syrup. Fried fruit. Fruit in cream or butter sauce. Vegetables Creamed or fried vegetables. Vegetables in a cheese sauce. Regular canned vegetables (not low-sodium or reduced-sodium). Regular canned tomato sauce and paste (not low-sodium or reduced-sodium). Regular tomato and vegetable juice (not low-sodium or reduced-sodium). Pickles. Olives. Grains Baked goods made with fat, such as croissants, muffins, or some breads. Dry pasta or rice meal packs. Meats and other proteins Fatty cuts of meat. Ribs. Fried meat. Bacon. Bologna, salami, and other precooked or cured meats, such as sausages or meat loaves. Fat from the back of a pig (fatback). Bratwurst. Salted nuts and seeds. Canned beans with added salt. Canned or smoked fish. Whole eggs or egg yolks. Chicken or turkey with skin. Dairy Whole or 2% milk, cream, and half-and-half. Whole or full-fat cream cheese. Whole-fat or sweetened yogurt. Full-fat cheese. Nondairy creamers. Whipped toppings. Processed cheese and  cheese spreads. Fats and oils Butter. Stick margarine. Lard. Shortening. Ghee. Bacon fat. Tropical oils, such as coconut, palm kernel, or palm oil. Seasonings and condiments Onion salt, garlic salt, seasoned salt, table salt, and sea salt. Worcestershire sauce. Tartar sauce. Barbecue sauce. Teriyaki sauce. Soy sauce, including reduced-sodium. Steak sauce. Canned and packaged gravies. Fish sauce. Oyster sauce. Cocktail sauce. Store-bought horseradish. Ketchup. Mustard. Meat flavorings and tenderizers. Bouillon cubes. Hot sauces. Pre-made or packaged marinades. Pre-made or packaged taco seasonings. Relishes. Regular salad dressings. Other foods Salted popcorn and pretzels. The items listed above may not be a complete list of foods and beverages you should avoid. Contact a dietitian for more information. Where to find more information National Heart, Lung, and Blood Institute: www.nhlbi.nih.gov American Heart Association: www.heart.org Academy of Nutrition and Dietetics: www.eatright.org National Kidney Foundation: www.kidney.org Summary The DASH eating plan is a healthy eating plan that has been shown to reduce high blood pressure (hypertension). It may also reduce your risk for type 2 diabetes, heart disease, and stroke. When on the DASH eating plan, aim to eat more fresh fruits and vegetables, whole grains, lean proteins, low-fat dairy, and heart-healthy fats. With the DASH eating plan, you should limit salt (sodium) intake to 2,300 mg a day. If you have hypertension, you may need to reduce your sodium intake to 1,500 mg a day. Work with your health care provider or dietitian to adjust your eating plan to your individual calorie needs. This information is not intended to replace advice given to you by your health care provider. Make sure you discuss any questions you have with your health care provider. Document Revised: 03/09/2019 Document Reviewed: 03/09/2019 Elsevier Patient Education  2023  Elsevier Inc.  

## 2022-02-11 NOTE — Progress Notes (Signed)
Established Patient Office Visit  Subjective   Patient ID: Larry French, male    DOB: Jul 19, 1960  Age: 61 y.o. MRN: [id number]  Chief Complaint  Patient presents with   Follow-up    HPI  Larry French is a 61 y/o male with PMH of Alcohol abuse, T2DM, Anxiety, CA-skin cancer, CAD, Depression, Hyperlipidemia, Hypertension, presents for follow up visit. Today, his blood pressure is well controlled at 132/85 mmHg. He checks his  blood pressure at home and it is normally less than 140/80 mmHg. His HgbA1c today is 5.3%. 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 He has also started back working at [employer] 3 days a week and enjoys "staying busy." He denies any hypo- or hyperglycemic symptoms. He has an appointment with Dr. Ginette Pitman in the next month at [facility] clinic. Overall, he is doing well and offers no further complaints.   Review of Systems  Constitutional: Negative.   HENT: Negative.    Eyes: Negative.   Respiratory: Negative.    Cardiovascular: Negative.   Gastrointestinal: Negative.   Genitourinary: Negative.   Musculoskeletal: Negative.   Skin: Negative.   Neurological: Negative.   Endo/Heme/Allergies: Negative.   Psychiatric/Behavioral: Negative.        Objective:    Today's Vitals   02/11/22 1000  BP: 132/85  Pulse: 78  Temp: 98.2 F (36.8 C)  TempSrc: Oral  SpO2: 95%  Weight: 197 lb (89.4 kg)  PainSc: 0-No pain   Body mass index is 29.95 kg/m.   Wt 197 lb (89.4 kg)   BMI 29.95 kg/m  BP Readings from Last 3 Encounters:  11/11/21 (!) 178/81  09/01/21 (!) 152/89  06/09/21 (!) 163/96   Wt Readings from Last 3 Encounters:  02/11/22 197 lb (89.4 kg)  11/11/21 184 lb 11.2 oz (83.8 kg)  09/01/21 200 lb 11.2 oz (91 kg)      Physical Exam Constitutional:      Appearance: Normal appearance. He is normal weight.  Cardiovascular:     Rate and Rhythm:  Normal rate and regular rhythm.     Pulses: Normal pulses.     Heart sounds: Normal heart sounds.  Pulmonary:     Effort: Pulmonary effort is normal.     Breath sounds: Normal breath sounds.  Neurological:     General: No focal deficit present.     Mental Status: He is alert and oriented to person, place, and time. Mental status is at baseline.  Psychiatric:        Mood and Affect: Mood normal.        Behavior: Behavior normal.        Thought Content: Thought content normal.        Judgment: Judgment normal.      No results found for any visits on 02/11/22.  Last CBC Lab Results  Component Value Date   WBC 10.3 03/02/2018   HGB 17.7 03/02/2018   HCT 50.2 03/02/2018   MCV 86 03/02/2018   MCH 30.5 03/02/2018   RDW 13.0 03/02/2018   PLT 254 56/38/9373   Last metabolic panel Lab Results  Component Value Date   GLUCOSE 104 (H) 10/22/2020   NA 142 10/22/2020   K 4.6 10/22/2020   CL 106 10/22/2020   CO2 20 10/22/2020   BUN 6 10/22/2020   CREATININE 1.12 10/22/2020   EGFR 76 10/22/2020   CALCIUM  8.8 10/22/2020   PROT 6.4 10/22/2020   ALBUMIN 4.2 10/22/2020   LABGLOB 2.2 10/22/2020   AGRATIO 1.9 10/22/2020   BILITOT <0.2 10/22/2020   ALKPHOS 98 10/22/2020   AST 17 10/22/2020   ALT 18 10/22/2020   ANIONGAP 14 06/18/2015   Last lipids Lab Results  Component Value Date   CHOL 129 12/17/2019   HDL 34 (L) 12/17/2019   LDLCALC 72 12/17/2019   TRIG 127 12/17/2019   CHOLHDL 3.8 12/17/2019   Last hemoglobin A1c Lab Results  Component Value Date   HGBA1C 5.5 12/31/2020   Last thyroid functions Lab Results  Component Value Date   TSH 2.790 03/02/2018   Last vitamin B12 and Folate Lab Results  Component Value Date   VITAMINB12 274 11/06/2020   FOLATE 6.3 11/06/2020      The ASCVD Risk score (Arnett DK, et al., 2019) failed to calculate for the following reasons:   The patient has a prior MI or stroke diagnosis    Assessment & Plan:   1. Controlled type  2 diabetes mellitus without complication, without long-term current use of insulin ([place]) - His diabetes is under control, HgbA1c is 5.3%. Discontinuing metformin and re-check HgbA1c at next visit. Will continue patient on Trulicity for weight management.  - POCT HgB A1C; Future - POCT Glucose (CBG); Future - Basic Metabolic Panel (BMET); Future  2. Essential hypertension - His blood pressure is well-controlled. He should continue on is current anti-hypertensive medications. Continue to check blood pressure at home. DASH diet and exercise as tolerated.  - clopidogrel (PLAVIX) 75 MG tablet; TAKE 1 TABLET BY MOUTH ONCE EVERY DAY.  Dispense: 30 tablet; Refill: 5 - losartan (COZAAR) 100 MG tablet; Take 1 tablet (100 mg total) by mouth once daily. Instead of 75 mg a day  Dispense: 90 tablet; Refill: 1  3. Hyperlipidemia, unspecified hyperlipidemia type - Continue taking rosuvastatin as prescribed. Low-cholesterol diet and exercise as tolerated.  - rosuvastatin (CRESTOR) 20 MG tablet; TAKE 1 TABLET BY MOUTH ONCE EVERY DAY.  Dispense: 90 tablet; Refill: 1  4. Tobacco abuse - He continues to smoke 1.5 pack of cigarettes per day. Lung CT ordered.  - CT CHEST LUNG CA SCREEN LOW DOSE W/O CM; Future   Follow-up in 6 months, around 08/12/22   Rayvon Char, FNP Student

## 2022-02-12 ENCOUNTER — Other Ambulatory Visit: Payer: Self-pay

## 2022-02-12 LAB — BASIC METABOLIC PANEL
BUN/Creatinine Ratio: 11 (ref 10–24)
BUN: 9 mg/dL (ref 8–27)
CO2: 18 mmol/L — ABNORMAL LOW (ref 20–29)
Calcium: 9.4 mg/dL (ref 8.6–10.2)
Chloride: 98 mmol/L (ref 96–106)
Creatinine, Ser: 0.85 mg/dL (ref 0.76–1.27)
Glucose: 84 mg/dL (ref 70–99)
Potassium: 4.4 mmol/L (ref 3.5–5.2)
Sodium: 136 mmol/L (ref 134–144)
eGFR: 99 mL/min/{1.73_m2} (ref >59)

## 2022-02-15 ENCOUNTER — Other Ambulatory Visit: Payer: Self-pay

## 2022-03-16 ENCOUNTER — Other Ambulatory Visit: Payer: Self-pay

## 2022-04-05 ENCOUNTER — Other Ambulatory Visit: Payer: Self-pay

## 2022-04-06 ENCOUNTER — Other Ambulatory Visit: Payer: Self-pay

## 2022-04-07 ENCOUNTER — Other Ambulatory Visit: Payer: Self-pay

## 2022-04-07 MED ORDER — GABAPENTIN 300 MG PO CAPS
600.0000 mg | ORAL_CAPSULE | Freq: Every day | ORAL | 0 refills | Status: DC
  Filled 2022-04-07: qty 60, 30d supply, fill #0

## 2022-04-13 ENCOUNTER — Other Ambulatory Visit: Payer: Self-pay

## 2022-04-14 ENCOUNTER — Other Ambulatory Visit: Payer: Self-pay

## 2022-04-15 ENCOUNTER — Other Ambulatory Visit: Payer: Self-pay

## 2022-04-16 ENCOUNTER — Other Ambulatory Visit: Payer: Self-pay

## 2022-04-20 ENCOUNTER — Other Ambulatory Visit: Payer: Self-pay

## 2022-04-20 MED ORDER — SERTRALINE HCL 50 MG PO TABS
50.0000 mg | ORAL_TABLET | Freq: Every day | ORAL | 5 refills | Status: DC
  Filled 2022-04-20: qty 30, 30d supply, fill #0
  Filled 2022-05-17: qty 30, 30d supply, fill #1
  Filled 2022-07-26: qty 30, 30d supply, fill #2

## 2022-05-05 ENCOUNTER — Other Ambulatory Visit: Payer: Self-pay

## 2022-05-06 ENCOUNTER — Other Ambulatory Visit: Payer: Self-pay

## 2022-05-06 MED ORDER — GABAPENTIN 300 MG PO CAPS
ORAL_CAPSULE | ORAL | 0 refills | Status: DC
  Filled 2022-05-06: qty 60, 30d supply, fill #0

## 2022-05-10 ENCOUNTER — Other Ambulatory Visit: Payer: Self-pay

## 2022-05-10 MED ORDER — QUETIAPINE FUMARATE 400 MG PO TABS
400.0000 mg | ORAL_TABLET | Freq: Every day | ORAL | 2 refills | Status: DC
  Filled 2022-05-10: qty 30, 30d supply, fill #0
  Filled 2022-06-09 – 2022-06-24 (×2): qty 30, 30d supply, fill #1
  Filled 2023-01-19: qty 30, 30d supply, fill #2

## 2022-05-10 MED ORDER — QUETIAPINE FUMARATE 200 MG PO TABS
200.0000 mg | ORAL_TABLET | Freq: Every evening | ORAL | 3 refills | Status: DC | PRN
  Filled 2022-05-10: qty 30, 30d supply, fill #0
  Filled 2022-06-09: qty 30, 30d supply, fill #1

## 2022-05-11 ENCOUNTER — Other Ambulatory Visit: Payer: Self-pay

## 2022-05-15 ENCOUNTER — Other Ambulatory Visit: Payer: Self-pay

## 2022-05-17 ENCOUNTER — Other Ambulatory Visit: Payer: Self-pay

## 2022-05-17 MED ORDER — SERTRALINE HCL 50 MG PO TABS
50.0000 mg | ORAL_TABLET | Freq: Every day | ORAL | 5 refills | Status: DC
  Filled 2022-05-17 – 2022-06-24 (×2): qty 30, 30d supply, fill #0
  Filled 2022-07-26: qty 30, 30d supply, fill #1

## 2022-05-30 ENCOUNTER — Other Ambulatory Visit: Payer: Self-pay

## 2022-05-31 ENCOUNTER — Other Ambulatory Visit: Payer: Self-pay

## 2022-05-31 MED ORDER — GABAPENTIN 300 MG PO CAPS
600.0000 mg | ORAL_CAPSULE | Freq: Every day | ORAL | 0 refills | Status: DC
  Filled 2022-05-31: qty 60, 30d supply, fill #0

## 2022-06-01 ENCOUNTER — Other Ambulatory Visit: Payer: Self-pay

## 2022-06-02 ENCOUNTER — Other Ambulatory Visit: Payer: Self-pay

## 2022-06-02 MED ORDER — CLOPIDOGREL BISULFATE 75 MG PO TABS
75.0000 mg | ORAL_TABLET | Freq: Every day | ORAL | 5 refills | Status: DC
  Filled 2022-06-02: qty 30, 30d supply, fill #0
  Filled 2022-07-26: qty 30, 30d supply, fill #1

## 2022-06-09 ENCOUNTER — Other Ambulatory Visit: Payer: Self-pay

## 2022-06-09 MED ORDER — QUETIAPINE FUMARATE 400 MG PO TABS
400.0000 mg | ORAL_TABLET | Freq: Every day | ORAL | 2 refills | Status: DC
  Filled 2022-06-09 – 2022-06-11 (×2): qty 30, 30d supply, fill #0
  Filled 2022-06-11: qty 17, 17d supply, fill #0

## 2022-06-09 MED ORDER — QUETIAPINE FUMARATE 200 MG PO TABS
200.0000 mg | ORAL_TABLET | Freq: Every evening | ORAL | 3 refills | Status: DC | PRN
  Filled 2022-06-09 – 2022-06-24 (×5): qty 30, 30d supply, fill #0

## 2022-06-10 ENCOUNTER — Other Ambulatory Visit: Payer: Self-pay

## 2022-06-11 ENCOUNTER — Other Ambulatory Visit: Payer: Self-pay

## 2022-06-19 IMAGING — MR MR LUMBAR SPINE W/O CM
5 series · 31 of 48 positions shown · non-contrast
Comparison: Lumbar radiographs 04/05/2015

CLINICAL DATA: Compression fracture of L1 vertebra, initial
encounter (HCC) 5KD.5A5R ([id number])

EXAM:
MRI LUMBAR SPINE WITHOUT CONTRAST
TECHNIQUE: Multiplanar, multisequence MR imaging of the lumbar spine was
performed. No intravenous contrast was administered.

[Series 5: T2 · sagittal · 4.0mm · 0.81mm/px · 6 of 17 slices shown (1 of 2)]
[im 1/17]
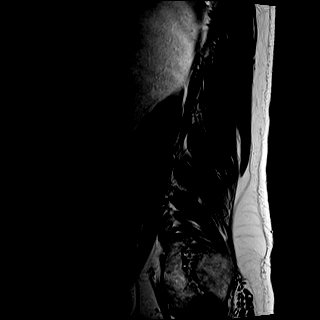
[im 4/17]
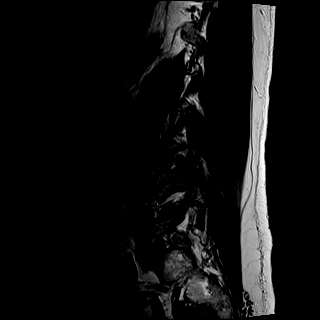
[im 7/17]
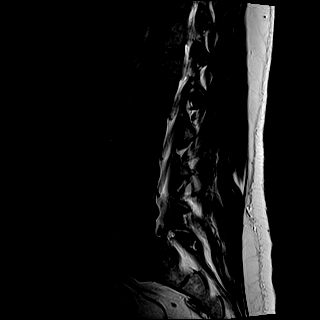
[im 10/17]
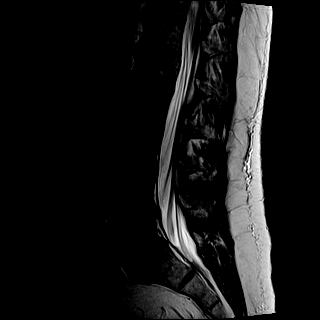
[im 13/17]
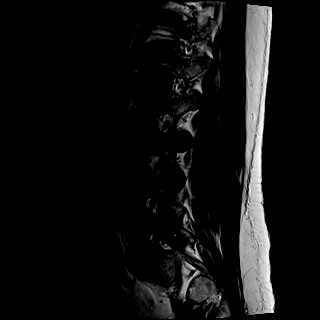
[im 17/17]
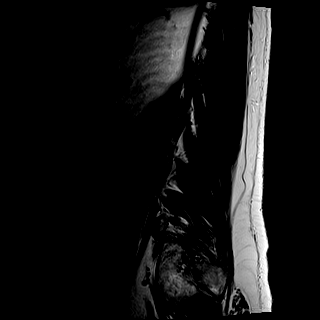

[Series 6: T1 · sagittal · 4.0mm · 0.81mm/px · 7 of 17 slices shown (1 of 2)]
[im 1/17]
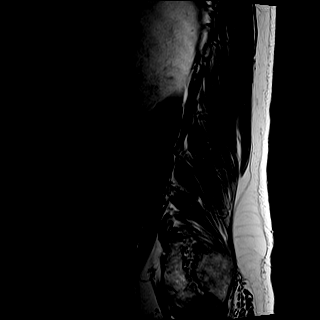
[im 3/17]
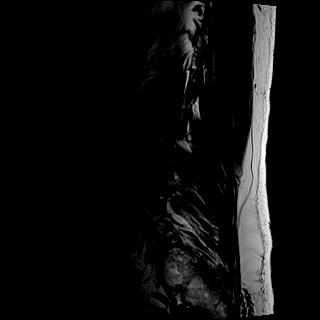
[im 6/17]
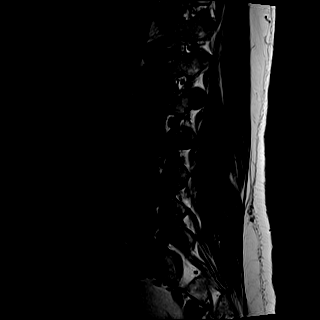
[im 9/17]
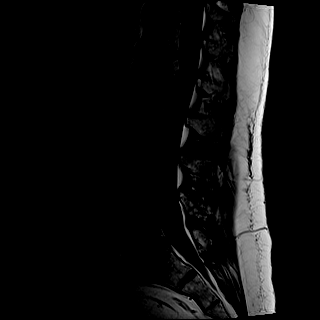
[im 11/17]
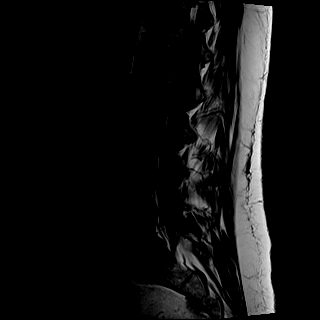
[im 14/17]
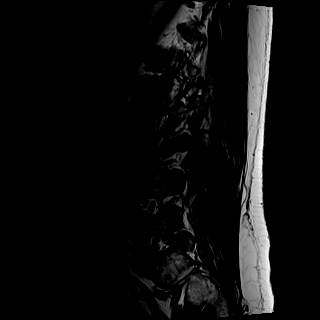
[im 17/17]
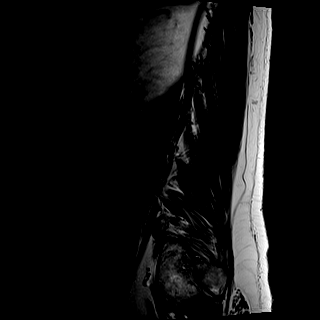

[Series 7: STIR · sagittal · 4.0mm · 0.41mm/px · 2 of 17 slices shown]
[im 1/17]
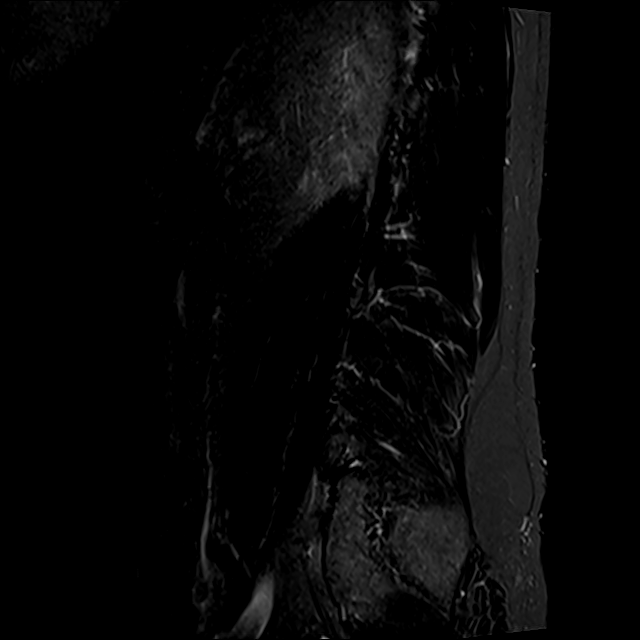
[im 3/17]
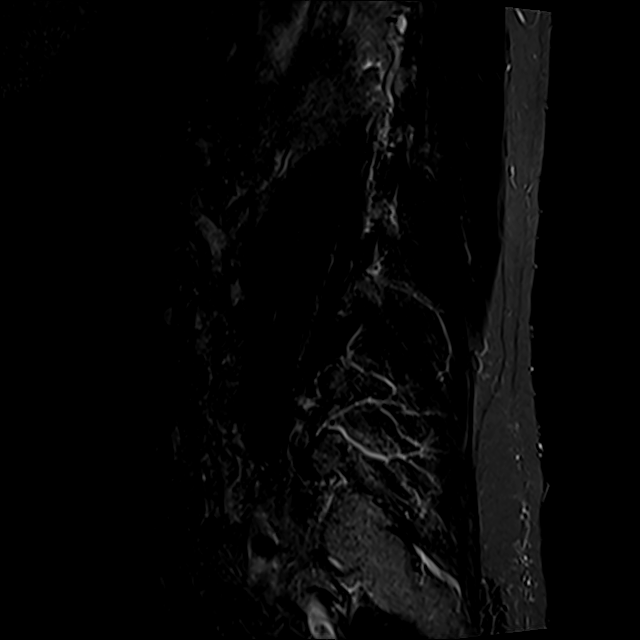

[Series 8: T2 · axial · 4.0mm · 0.78mm/px · z∈[-100,+117]mm · 8 of 36 slices shown (2 of 2)]
[im 1/36]
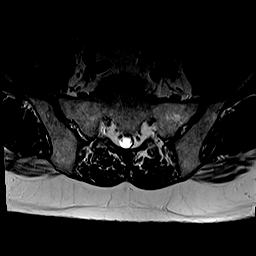
[im 6/36]
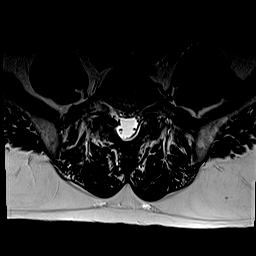
[im 11/36]
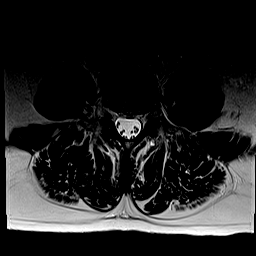
[im 17/36]
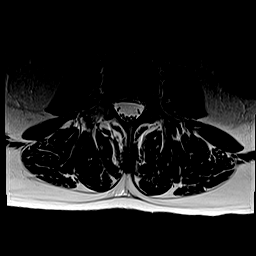
[im 19/36]
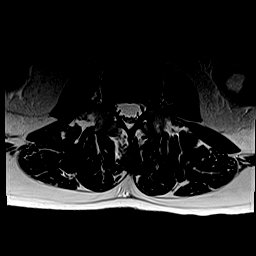
[im 25/36]
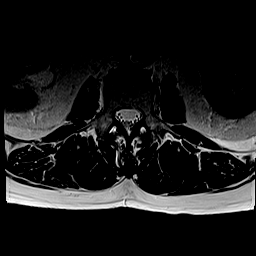
[im 30/36]
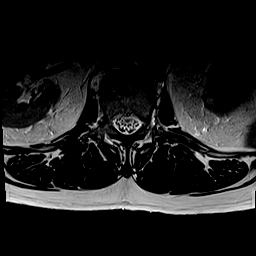
[im 36/36]
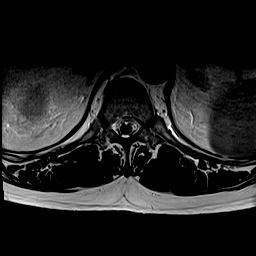

[Series 9: T1 · axial · 4.0mm · 0.39mm/px · z∈[-100,+117]mm · 8 of 36 slices shown (2 of 2)]
[im 1/36]
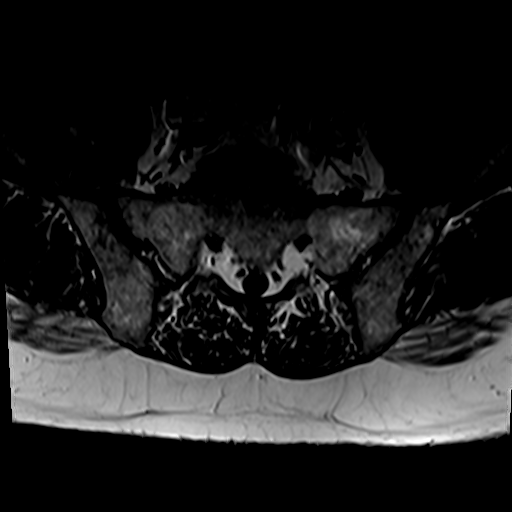
[im 6/36]
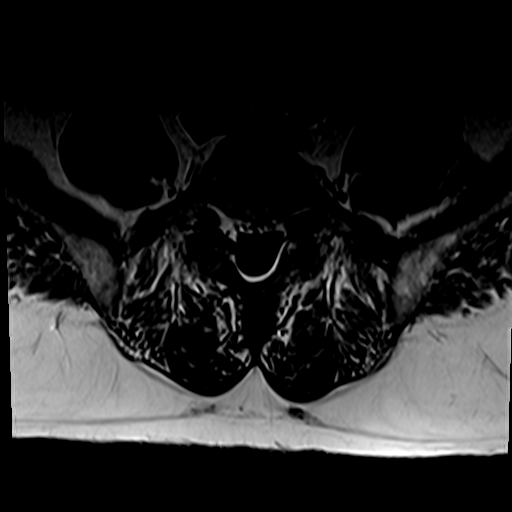
[im 11/36]
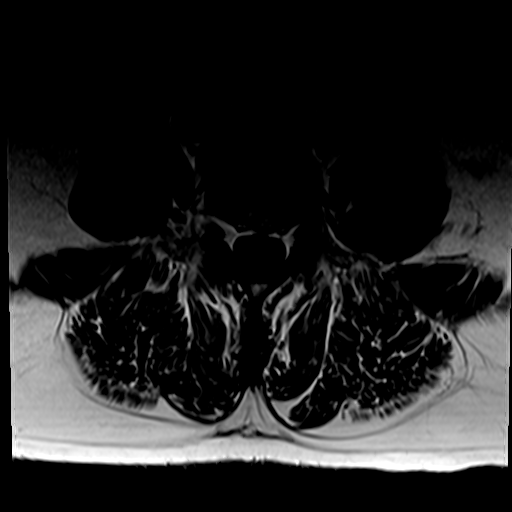
[im 17/36]
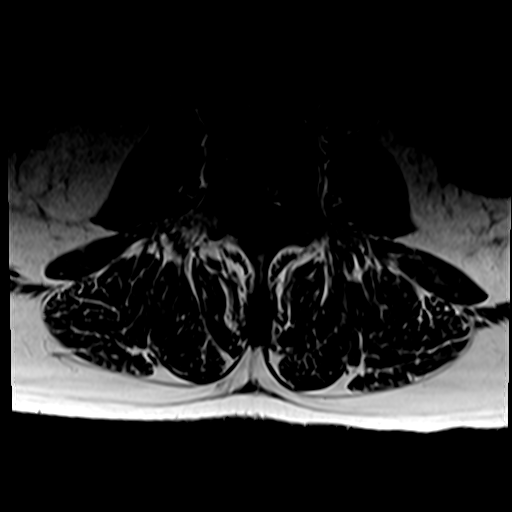
[im 19/36]
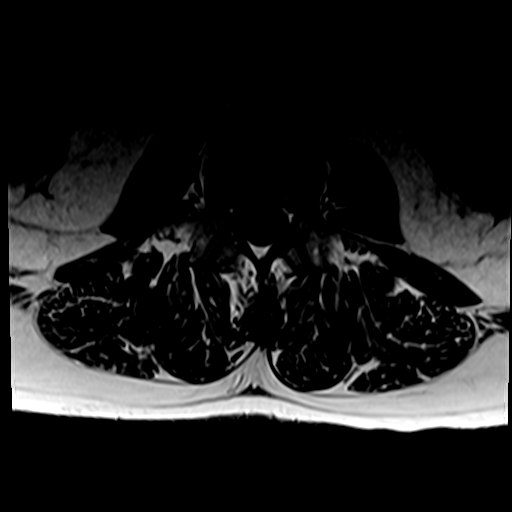
[im 25/36]
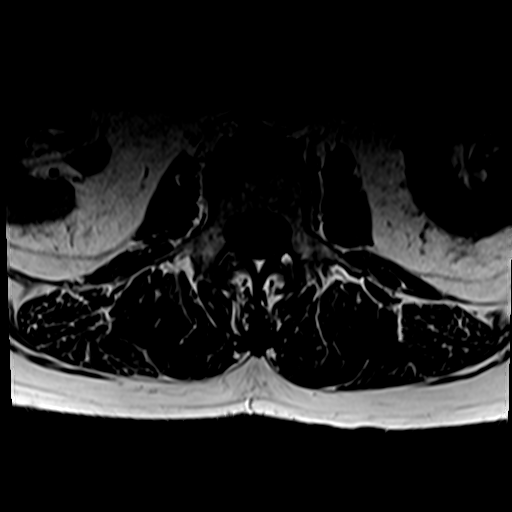
[im 30/36]
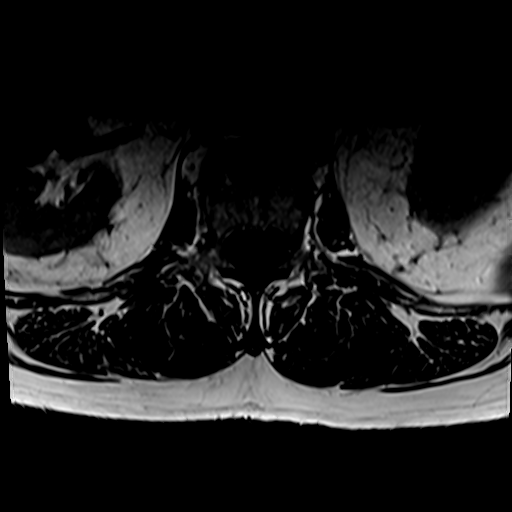
[im 36/36]
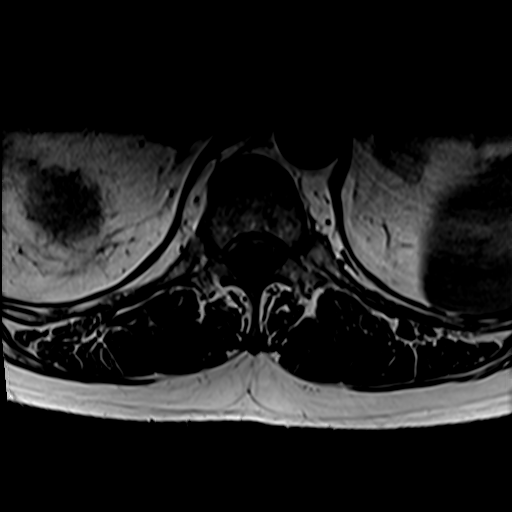

[31 of 48 positions shown; findings below may reference images not displayed]

FINDINGS: Segmentation: Standard segmentation is assumed. The inferior-most
fully formed intervertebral disc labeled L5-S1.

Alignment:  No substantial sagittal subluxation.

Vertebrae: Remote L1 compression fracture with 40% height loss. No
associated bone marrow edema. Slight bony retropulsion without
significant canal stenosis.

Conus medullaris and cauda equina: Conus extends to the T12-L1
level. Conus appears normal.

Paraspinal and other soft tissues: Left renal cysts.

Disc levels:

T12-L1: No significant disc protrusion, foraminal stenosis, or canal
stenosis.

L1-L2: Slight disc bulge without significant stenosis.

L2-L3: No significant disc protrusion, foraminal stenosis, or canal
stenosis.

L3-L4: Slight disc bulge without significant stenosis. Facet
arthropathy.

L4-L5: Mild disc bulge with superimposed inferiorly directed central
disc protrusion. Bilateral facet arthropathy. Resulting mild left
foraminal stenosis without significant canal or right foraminal
stenosis.

L5-S1: Broad disc bulge with superimposed left foraminal disc
protrusion, including far lateral/extraforaminal component.
Resulting severe left foraminal stenosis with potential impingement
of the exiting/exited left L5 nerve. Mild right foraminal stenosis.
Patent canal.
IMPRESSION: 1. At L5-S1, left foraminal/far lateral/extraforaminal disc
protrusion with resulting severe left foraminal stenosis and
potential impingement of the exiting/exited left L5 nerve. Mild
right foraminal stenosis level.
2. At L4-L5, central disc protrusion without significant canal
stenosis. Mild left foraminal stenosis.
3. Remote L1 compression fracture with 40% height loss. Slight bony
retropulsion without significant canal stenosis.

## 2022-06-24 ENCOUNTER — Other Ambulatory Visit: Payer: Self-pay

## 2022-06-28 ENCOUNTER — Other Ambulatory Visit: Payer: Self-pay

## 2022-06-28 MED ORDER — CLOPIDOGREL BISULFATE 75 MG PO TABS
75.0000 mg | ORAL_TABLET | Freq: Every day | ORAL | 5 refills | Status: DC
  Filled 2022-06-28 – 2022-07-26 (×3): qty 30, 30d supply, fill #0

## 2022-06-28 MED ORDER — GABAPENTIN 300 MG PO CAPS
600.0000 mg | ORAL_CAPSULE | Freq: Every day | ORAL | 0 refills | Status: DC
  Filled 2022-06-28 – 2022-07-26 (×2): qty 60, 30d supply, fill #0

## 2022-07-06 ENCOUNTER — Other Ambulatory Visit: Payer: Self-pay

## 2022-07-13 ENCOUNTER — Other Ambulatory Visit: Payer: Self-pay

## 2022-07-19 ENCOUNTER — Other Ambulatory Visit: Payer: Self-pay

## 2022-07-19 MED ORDER — QUETIAPINE FUMARATE 400 MG PO TABS
400.0000 mg | ORAL_TABLET | Freq: Every evening | ORAL | 2 refills | Status: DC
  Filled 2022-07-19 – 2022-07-22 (×2): qty 30, 30d supply, fill #0

## 2022-07-19 MED ORDER — TRAZODONE HCL 100 MG PO TABS
200.0000 mg | ORAL_TABLET | Freq: Every day | ORAL | 1 refills | Status: DC
  Filled 2022-07-19: qty 180, 90d supply, fill #0
  Filled 2022-09-30: qty 180, 90d supply, fill #1

## 2022-07-19 MED ORDER — QUETIAPINE FUMARATE 200 MG PO TABS
200.0000 mg | ORAL_TABLET | Freq: Every day | ORAL | 2 refills | Status: DC
  Filled 2022-07-19 – 2022-07-26 (×4): qty 30, 30d supply, fill #0
  Filled 2023-01-19: qty 30, 30d supply, fill #1

## 2022-07-22 ENCOUNTER — Other Ambulatory Visit: Payer: Self-pay

## 2022-07-26 ENCOUNTER — Other Ambulatory Visit: Payer: Self-pay

## 2022-07-26 ENCOUNTER — Other Ambulatory Visit: Payer: Self-pay | Admitting: Gerontology

## 2022-07-26 DIAGNOSIS — I10 Essential (primary) hypertension: Secondary | ICD-10-CM

## 2022-07-26 MED ORDER — METOPROLOL TARTRATE 25 MG PO TABS
25.0000 mg | ORAL_TABLET | Freq: Two times a day (BID) | ORAL | 1 refills | Status: DC
  Filled 2022-07-26: qty 120, 60d supply, fill #0
  Filled 2022-12-01: qty 60, 30d supply, fill #1
  Filled 2023-01-19: qty 60, 30d supply, fill #2

## 2022-07-26 MED ORDER — SERTRALINE HCL 50 MG PO TABS
50.0000 mg | ORAL_TABLET | Freq: Every day | ORAL | 5 refills | Status: DC
  Filled 2022-07-26 – 2022-09-27 (×2): qty 30, 30d supply, fill #0

## 2022-07-26 MED ORDER — TRAZODONE HCL 100 MG PO TABS
200.0000 mg | ORAL_TABLET | Freq: Every day | ORAL | 1 refills | Status: DC
  Filled 2022-07-26: qty 180, 90d supply, fill #0

## 2022-07-26 MED ORDER — CLOPIDOGREL BISULFATE 75 MG PO TABS
75.0000 mg | ORAL_TABLET | Freq: Every day | ORAL | 5 refills | Status: DC
  Filled 2022-07-26: qty 30, 30d supply, fill #0

## 2022-07-26 MED ORDER — ROSUVASTATIN CALCIUM 20 MG PO TABS
20.0000 mg | ORAL_TABLET | Freq: Every day | ORAL | 1 refills | Status: DC
  Filled 2022-07-26: qty 90, 90d supply, fill #0

## 2022-07-26 MED ORDER — LOSARTAN POTASSIUM 100 MG PO TABS
100.0000 mg | ORAL_TABLET | Freq: Every day | ORAL | 1 refills | Status: DC
  Filled 2022-07-26: qty 90, 90d supply, fill #0
  Filled 2023-05-09 (×2): qty 90, 90d supply, fill #1

## 2022-07-26 MED ORDER — SERTRALINE HCL 100 MG PO TABS
100.0000 mg | ORAL_TABLET | Freq: Every day | ORAL | 1 refills | Status: DC
  Filled 2022-07-26: qty 90, 90d supply, fill #0
  Filled 2022-11-25: qty 90, 90d supply, fill #1

## 2022-07-27 ENCOUNTER — Other Ambulatory Visit: Payer: Self-pay

## 2022-07-27 NOTE — Telephone Encounter (Signed)
Refilled by Dr. Marcello Fennel at [facility] on 07/26/22

## 2022-07-28 ENCOUNTER — Other Ambulatory Visit: Payer: Self-pay

## 2022-08-10 ENCOUNTER — Other Ambulatory Visit: Payer: Self-pay

## 2022-08-12 ENCOUNTER — Ambulatory Visit: Payer: Self-pay | Admitting: Gerontology

## 2022-08-18 ENCOUNTER — Ambulatory Visit: Payer: Self-pay | Admitting: Gerontology

## 2022-08-23 ENCOUNTER — Other Ambulatory Visit: Payer: Self-pay

## 2022-08-23 MED ORDER — GABAPENTIN 300 MG PO CAPS
600.0000 mg | ORAL_CAPSULE | Freq: Every day | ORAL | 0 refills | Status: DC
  Filled 2022-08-23: qty 60, 30d supply, fill #0

## 2022-08-24 ENCOUNTER — Other Ambulatory Visit: Payer: Self-pay

## 2022-08-24 MED ORDER — QUETIAPINE FUMARATE 400 MG PO TABS
400.0000 mg | ORAL_TABLET | Freq: Every day | ORAL | 2 refills | Status: DC
  Filled 2022-08-24: qty 30, 30d supply, fill #0
  Filled 2022-09-20: qty 30, 30d supply, fill #1

## 2022-08-24 MED ORDER — QUETIAPINE FUMARATE 200 MG PO TABS
200.0000 mg | ORAL_TABLET | Freq: Every evening | ORAL | 2 refills | Status: DC | PRN
  Filled 2022-08-24: qty 30, 30d supply, fill #0
  Filled 2022-09-20: qty 30, 30d supply, fill #1

## 2022-08-24 MED ORDER — GABAPENTIN 300 MG PO CAPS
600.0000 mg | ORAL_CAPSULE | Freq: Every day | ORAL | 0 refills | Status: DC
  Filled 2022-08-24 – 2022-09-27 (×2): qty 60, 30d supply, fill #0

## 2022-08-25 ENCOUNTER — Ambulatory Visit: Payer: Medicaid Other | Admitting: Gerontology

## 2022-08-31 ENCOUNTER — Other Ambulatory Visit: Payer: Self-pay

## 2022-08-31 MED ORDER — CLOPIDOGREL BISULFATE 75 MG PO TABS
75.0000 mg | ORAL_TABLET | Freq: Every day | ORAL | 5 refills | Status: DC
  Filled 2022-08-31 – 2022-09-27 (×2): qty 30, 30d supply, fill #0

## 2022-09-06 ENCOUNTER — Other Ambulatory Visit: Payer: Self-pay

## 2022-09-15 ENCOUNTER — Ambulatory Visit: Payer: Medicaid Other | Admitting: Gerontology

## 2022-09-17 ENCOUNTER — Other Ambulatory Visit: Payer: Self-pay

## 2022-09-20 ENCOUNTER — Other Ambulatory Visit: Payer: Self-pay

## 2022-09-27 ENCOUNTER — Other Ambulatory Visit: Payer: Self-pay

## 2022-09-29 ENCOUNTER — Other Ambulatory Visit: Payer: Self-pay

## 2022-09-30 ENCOUNTER — Other Ambulatory Visit: Payer: Self-pay

## 2022-09-30 MED ORDER — GABAPENTIN 300 MG PO CAPS: 600.0000 mg | ORAL_CAPSULE | Freq: Every day | ORAL | 0 refills | Status: DC

## 2022-10-04 ENCOUNTER — Other Ambulatory Visit: Payer: Self-pay

## 2022-10-05 ENCOUNTER — Other Ambulatory Visit: Payer: Self-pay

## 2022-10-05 MED ORDER — TRAZODONE HCL 100 MG PO TABS
100.0000 mg | ORAL_TABLET | Freq: Every day | ORAL | 1 refills | Status: DC
  Filled 2022-10-05: qty 90, 90d supply, fill #0

## 2022-10-08 ENCOUNTER — Other Ambulatory Visit: Payer: Self-pay

## 2022-10-18 ENCOUNTER — Other Ambulatory Visit: Payer: Self-pay

## 2022-10-18 MED ORDER — QUETIAPINE FUMARATE 400 MG PO TABS
400.0000 mg | ORAL_TABLET | Freq: Every day | ORAL | 2 refills | Status: DC
  Filled 2022-10-18: qty 30, 30d supply, fill #0

## 2022-10-18 MED ORDER — QUETIAPINE FUMARATE 200 MG PO TABS
200.0000 mg | ORAL_TABLET | Freq: Every evening | ORAL | 2 refills | Status: DC | PRN
  Filled 2022-10-18: qty 30, 30d supply, fill #0

## 2022-10-18 MED ORDER — ROSUVASTATIN CALCIUM 20 MG PO TABS
20.0000 mg | ORAL_TABLET | Freq: Every day | ORAL | 1 refills | Status: DC
  Filled 2022-10-18: qty 90, 90d supply, fill #0
  Filled 2023-05-17: qty 90, 90d supply, fill #1

## 2022-10-18 MED ORDER — CLOPIDOGREL BISULFATE 75 MG PO TABS
75.0000 mg | ORAL_TABLET | Freq: Every day | ORAL | 5 refills | Status: DC
  Filled 2022-10-18 – 2022-11-25 (×2): qty 30, 30d supply, fill #0
  Filled 2022-12-29: qty 30, 30d supply, fill #1

## 2022-10-25 ENCOUNTER — Other Ambulatory Visit: Payer: Self-pay

## 2022-10-25 MED ORDER — GABAPENTIN 300 MG PO CAPS
600.0000 mg | ORAL_CAPSULE | Freq: Every day | ORAL | 0 refills | Status: DC
  Filled 2022-10-25: qty 90, 45d supply, fill #0

## 2022-10-25 MED ORDER — QUETIAPINE FUMARATE 400 MG PO TABS
400.0000 mg | ORAL_TABLET | Freq: Every day | ORAL | 2 refills | Status: DC
  Filled 2022-10-25 – 2022-11-25 (×2): qty 30, 30d supply, fill #0

## 2022-10-25 MED ORDER — QUETIAPINE FUMARATE 200 MG PO TABS
200.0000 mg | ORAL_TABLET | Freq: Every evening | ORAL | 2 refills | Status: DC | PRN
  Filled 2022-10-25 – 2022-11-25 (×2): qty 30, 30d supply, fill #0

## 2022-10-27 ENCOUNTER — Other Ambulatory Visit: Payer: Self-pay

## 2022-11-25 ENCOUNTER — Other Ambulatory Visit: Payer: Self-pay

## 2022-12-01 ENCOUNTER — Other Ambulatory Visit: Payer: Self-pay

## 2022-12-01 MED ORDER — GABAPENTIN 300 MG PO CAPS
600.0000 mg | ORAL_CAPSULE | Freq: Every day | ORAL | 2 refills | Status: DC
  Filled 2022-12-01: qty 60, 30d supply, fill #0
  Filled 2022-12-29: qty 60, 30d supply, fill #1

## 2022-12-02 ENCOUNTER — Other Ambulatory Visit: Payer: Self-pay

## 2022-12-21 ENCOUNTER — Other Ambulatory Visit: Payer: Self-pay

## 2022-12-21 MED ORDER — QUETIAPINE FUMARATE 400 MG PO TABS
400.0000 mg | ORAL_TABLET | Freq: Every day | ORAL | 2 refills | Status: DC
  Filled 2022-12-21: qty 30, 30d supply, fill #0

## 2022-12-21 MED ORDER — TRAZODONE HCL 100 MG PO TABS
100.0000 mg | ORAL_TABLET | Freq: Every day | ORAL | 1 refills | Status: DC
  Filled 2022-12-21: qty 90, 90d supply, fill #0
  Filled 2023-06-06 – 2023-09-17 (×2): qty 90, 90d supply, fill #1

## 2022-12-21 MED ORDER — QUETIAPINE FUMARATE 200 MG PO TABS
200.0000 mg | ORAL_TABLET | Freq: Every evening | ORAL | 2 refills | Status: DC | PRN
  Filled 2022-12-21: qty 30, 30d supply, fill #0

## 2022-12-24 ENCOUNTER — Other Ambulatory Visit: Payer: Self-pay

## 2022-12-27 ENCOUNTER — Other Ambulatory Visit: Payer: Self-pay | Admitting: Gerontology

## 2022-12-27 ENCOUNTER — Other Ambulatory Visit: Payer: Self-pay

## 2022-12-27 DIAGNOSIS — E119 Type 2 diabetes mellitus without complications: Secondary | ICD-10-CM

## 2022-12-27 DIAGNOSIS — I25118 Atherosclerotic heart disease of native coronary artery with other forms of angina pectoris: Secondary | ICD-10-CM

## 2022-12-28 ENCOUNTER — Other Ambulatory Visit: Payer: Self-pay

## 2022-12-30 ENCOUNTER — Other Ambulatory Visit: Payer: Self-pay

## 2022-12-31 ENCOUNTER — Other Ambulatory Visit: Payer: Self-pay

## 2022-12-31 MED ORDER — GABAPENTIN 300 MG PO CAPS
600.0000 mg | ORAL_CAPSULE | Freq: Every day | ORAL | 2 refills | Status: AC
Start: 2022-12-31 — End: ?
  Filled 2022-12-31 – 2023-01-19 (×2): qty 60, 30d supply, fill #0

## 2022-12-31 MED ORDER — CLOPIDOGREL BISULFATE 75 MG PO TABS
75.0000 mg | ORAL_TABLET | Freq: Every day | ORAL | 5 refills | Status: DC
  Filled 2022-12-31 – 2023-03-16 (×3): qty 30, 30d supply, fill #0
  Filled 2023-06-07 – 2023-08-14 (×7): qty 30, 30d supply, fill #1
  Filled 2023-09-10: qty 30, 30d supply, fill #2
  Filled 2023-11-01: qty 30, 30d supply, fill #3
  Filled 2023-11-29: qty 30, 30d supply, fill #4

## 2023-01-05 ENCOUNTER — Other Ambulatory Visit: Payer: Self-pay

## 2023-01-05 MED ORDER — SERTRALINE HCL 50 MG PO TABS
50.0000 mg | ORAL_TABLET | Freq: Every day | ORAL | 1 refills | Status: DC
  Filled 2023-01-05: qty 30, 30d supply, fill #0
  Filled 2023-06-07: qty 30, 30d supply, fill #1
  Filled 2023-07-09: qty 30, 30d supply, fill #2
  Filled 2023-08-14: qty 30, 30d supply, fill #3
  Filled 2023-09-10: qty 30, 30d supply, fill #4
  Filled 2023-10-16: qty 30, 30d supply, fill #5

## 2023-01-05 MED ORDER — ARIPIPRAZOLE 2 MG PO TABS
2.0000 mg | ORAL_TABLET | Freq: Every day | ORAL | 3 refills | Status: DC
  Filled 2023-01-05: qty 30, 30d supply, fill #0
  Filled 2023-06-07: qty 30, 30d supply, fill #1
  Filled 2023-07-07 (×2): qty 30, 30d supply, fill #2
  Filled 2023-08-10 (×2): qty 30, 30d supply, fill #3
  Filled ????-??-?? (×2): fill #2

## 2023-01-19 ENCOUNTER — Other Ambulatory Visit: Payer: Self-pay

## 2023-01-19 MED ORDER — QUETIAPINE FUMARATE 400 MG PO TABS
400.0000 mg | ORAL_TABLET | Freq: Every evening | ORAL | 2 refills | Status: DC
Start: 2023-01-19 — End: 2024-02-08
  Filled 2023-05-09: qty 30, 30d supply, fill #0
  Filled 2023-06-06: qty 30, 30d supply, fill #1
  Filled 2023-08-05 – 2023-08-17 (×4): qty 30, 30d supply, fill #2

## 2023-01-19 MED ORDER — QUETIAPINE FUMARATE 200 MG PO TABS
200.0000 mg | ORAL_TABLET | Freq: Every evening | ORAL | 2 refills | Status: DC
  Filled 2023-05-09: qty 30, 30d supply, fill #0
  Filled 2023-06-06: qty 30, 30d supply, fill #1
  Filled 2023-07-09 – 2023-12-20 (×10): qty 30, 30d supply, fill #2
  Filled ????-??-?? (×2): fill #2

## 2023-01-19 MED ORDER — METOPROLOL TARTRATE 25 MG PO TABS
25.0000 mg | ORAL_TABLET | Freq: Two times a day (BID) | ORAL | 1 refills | Status: DC
  Filled 2023-06-07: qty 120, 60d supply, fill #0
  Filled 2023-08-26 – 2023-11-06 (×2): qty 120, 60d supply, fill #1

## 2023-01-19 MED ORDER — GABAPENTIN 300 MG PO CAPS
600.0000 mg | ORAL_CAPSULE | Freq: Every evening | ORAL | 2 refills | Status: DC
  Filled 2023-05-09: qty 60, 30d supply, fill #0
  Filled 2023-06-06: qty 60, 30d supply, fill #1
  Filled 2023-07-08: qty 60, 30d supply, fill #2

## 2023-02-09 ENCOUNTER — Other Ambulatory Visit: Payer: Self-pay

## 2023-02-09 MED ORDER — TRAMADOL HCL 50 MG PO TABS: 50.0000 mg | ORAL_TABLET | Freq: Every day | ORAL | 1 refills | Status: DC | PRN

## 2023-02-09 MED ORDER — TRAZODONE HCL 100 MG PO TABS
100.0000 mg | ORAL_TABLET | Freq: Every day | ORAL | 1 refills | Status: DC
  Filled 2023-02-09: qty 90, 90d supply, fill #0
  Filled 2023-05-09: qty 90, 90d supply, fill #1

## 2023-02-09 MED ORDER — CLONAZEPAM 1 MG PO TABS
1.0000 mg | ORAL_TABLET | Freq: Three times a day (TID) | ORAL | 0 refills | Status: DC | PRN
  Filled 2023-02-21: qty 90, 30d supply, fill #0

## 2023-02-09 MED ORDER — CLOPIDOGREL BISULFATE 75 MG PO TABS
75.0000 mg | ORAL_TABLET | Freq: Every day | ORAL | 5 refills | Status: DC
  Filled 2023-02-09: qty 30, 30d supply, fill #0
  Filled 2023-06-07 – 2024-01-19 (×3): qty 30, 30d supply, fill #1

## 2023-02-15 ENCOUNTER — Other Ambulatory Visit: Payer: Self-pay

## 2023-02-15 MED ORDER — QUETIAPINE FUMARATE 400 MG PO TABS
400.0000 mg | ORAL_TABLET | Freq: Every day | ORAL | 2 refills | Status: DC
  Filled 2023-02-15: qty 30, 30d supply, fill #0
  Filled 2023-03-14 – 2023-03-16 (×2): qty 30, 30d supply, fill #1
  Filled 2023-04-11: qty 30, 30d supply, fill #2

## 2023-02-15 MED ORDER — GABAPENTIN 300 MG PO CAPS
600.0000 mg | ORAL_CAPSULE | Freq: Every day | ORAL | 2 refills | Status: DC
  Filled 2023-02-15: qty 60, 30d supply, fill #0
  Filled 2023-03-14 – 2023-03-16 (×2): qty 60, 30d supply, fill #1
  Filled 2023-04-11: qty 60, 30d supply, fill #2

## 2023-02-15 MED ORDER — QUETIAPINE FUMARATE 200 MG PO TABS
200.0000 mg | ORAL_TABLET | Freq: Every evening | ORAL | 2 refills | Status: DC | PRN
  Filled 2023-02-15: qty 30, 30d supply, fill #0
  Filled 2023-03-16: qty 30, 30d supply, fill #1
  Filled 2023-04-11: qty 30, 30d supply, fill #2

## 2023-02-16 ENCOUNTER — Other Ambulatory Visit: Payer: Self-pay

## 2023-02-21 ENCOUNTER — Other Ambulatory Visit: Payer: Self-pay

## 2023-02-28 ENCOUNTER — Other Ambulatory Visit: Payer: Self-pay

## 2023-02-28 MED ORDER — METOPROLOL TARTRATE 25 MG PO TABS
25.0000 mg | ORAL_TABLET | Freq: Two times a day (BID) | ORAL | 1 refills | Status: DC
  Filled 2023-02-28: qty 60, 30d supply, fill #0
  Filled 2023-03-16: qty 120, 60d supply, fill #0
  Filled 2024-01-19: qty 120, 60d supply, fill #1

## 2023-03-14 ENCOUNTER — Other Ambulatory Visit: Payer: Self-pay

## 2023-03-14 ENCOUNTER — Other Ambulatory Visit (HOSPITAL_COMMUNITY): Payer: Self-pay

## 2023-03-16 ENCOUNTER — Other Ambulatory Visit (HOSPITAL_COMMUNITY): Payer: Self-pay

## 2023-03-16 ENCOUNTER — Other Ambulatory Visit: Payer: Self-pay

## 2023-04-11 ENCOUNTER — Other Ambulatory Visit: Payer: Self-pay

## 2023-04-19 ENCOUNTER — Other Ambulatory Visit: Payer: Self-pay

## 2023-04-19 MED ORDER — TRAMADOL HCL 50 MG PO TABS
50.0000 mg | ORAL_TABLET | Freq: Two times a day (BID) | ORAL | 3 refills | Status: DC | PRN
  Filled 2023-04-19: qty 60, 30d supply, fill #0
  Filled 2023-05-17: qty 60, 30d supply, fill #1
  Filled 2023-06-07 – 2023-06-15 (×2): qty 60, 30d supply, fill #2
  Filled 2023-07-09 – 2023-07-19 (×4): qty 60, 30d supply, fill #3

## 2023-05-05 ENCOUNTER — Other Ambulatory Visit: Payer: Self-pay

## 2023-05-09 ENCOUNTER — Other Ambulatory Visit: Payer: Self-pay

## 2023-05-17 ENCOUNTER — Other Ambulatory Visit: Payer: Self-pay

## 2023-05-18 ENCOUNTER — Other Ambulatory Visit: Payer: Self-pay

## 2023-05-19 ENCOUNTER — Other Ambulatory Visit: Payer: Self-pay

## 2023-05-19 MED ORDER — TRULICITY 3 MG/0.5ML ~~LOC~~ SOAJ
3.0000 mg | SUBCUTANEOUS | 2 refills | Status: DC
  Filled 2023-05-19: qty 2, 28d supply, fill #0
  Filled 2023-06-07: qty 2, 28d supply, fill #1
  Filled 2023-09-05 – 2023-09-19 (×3): qty 2, 28d supply, fill #2
  Filled ????-??-?? (×4): fill #2

## 2023-05-20 ENCOUNTER — Other Ambulatory Visit: Payer: Self-pay

## 2023-05-23 ENCOUNTER — Other Ambulatory Visit: Payer: Self-pay

## 2023-05-25 ENCOUNTER — Other Ambulatory Visit: Payer: Self-pay

## 2023-05-25 MED ORDER — TRAZODONE HCL 100 MG PO TABS: 100.0000 mg | ORAL_TABLET | Freq: Every day | ORAL | 1 refills | Status: DC

## 2023-05-25 MED ORDER — QUETIAPINE FUMARATE 400 MG PO TABS
400.0000 mg | ORAL_TABLET | Freq: Every evening | ORAL | 2 refills | Status: DC
  Filled 2023-05-25 – 2023-07-01 (×2): qty 30, 30d supply, fill #0
  Filled 2023-09-19 – 2023-11-01 (×5): qty 30, 30d supply, fill #1
  Filled 2023-11-29 – 2024-01-25 (×3): qty 30, 30d supply, fill #2
  Filled ????-??-??: fill #1

## 2023-05-25 MED ORDER — QUETIAPINE FUMARATE 200 MG PO TABS
200.0000 mg | ORAL_TABLET | Freq: Every day | ORAL | 2 refills | Status: DC
  Filled 2023-06-29: qty 30, 30d supply, fill #0
  Filled 2023-08-05 – 2023-08-20 (×3): qty 30, 30d supply, fill #1
  Filled 2023-09-19: qty 30, 30d supply, fill #2

## 2023-05-26 ENCOUNTER — Other Ambulatory Visit: Payer: Self-pay

## 2023-05-26 MED ORDER — LOSARTAN POTASSIUM 100 MG PO TABS
100.0000 mg | ORAL_TABLET | Freq: Every day | ORAL | 0 refills | Status: DC
  Filled 2023-06-07 – 2023-07-19 (×7): qty 90, 90d supply, fill #0
  Filled ????-??-?? (×2): fill #0

## 2023-05-26 MED ORDER — METOPROLOL TARTRATE 25 MG PO TABS
25.0000 mg | ORAL_TABLET | Freq: Two times a day (BID) | ORAL | 0 refills | Status: DC
  Filled 2023-05-26 – 2023-08-10 (×3): qty 180, 90d supply, fill #0

## 2023-05-26 MED ORDER — CLOPIDOGREL BISULFATE 75 MG PO TABS
75.0000 mg | ORAL_TABLET | Freq: Every day | ORAL | 0 refills | Status: DC
  Filled 2023-05-26 – 2023-06-07 (×2): qty 90, 90d supply, fill #0

## 2023-05-30 ENCOUNTER — Other Ambulatory Visit: Payer: Self-pay

## 2023-06-06 ENCOUNTER — Other Ambulatory Visit: Payer: Self-pay

## 2023-06-07 ENCOUNTER — Other Ambulatory Visit: Payer: Self-pay

## 2023-06-08 ENCOUNTER — Other Ambulatory Visit: Payer: Self-pay

## 2023-06-09 ENCOUNTER — Other Ambulatory Visit: Payer: Self-pay

## 2023-06-10 ENCOUNTER — Other Ambulatory Visit: Payer: Self-pay

## 2023-06-10 MED ORDER — CLONAZEPAM 1 MG PO TABS
1.0000 mg | ORAL_TABLET | Freq: Three times a day (TID) | ORAL | 0 refills | Status: DC | PRN
  Filled 2023-06-11 – 2023-06-13 (×2): qty 90, 30d supply, fill #0

## 2023-06-11 ENCOUNTER — Other Ambulatory Visit: Payer: Self-pay

## 2023-06-12 ENCOUNTER — Other Ambulatory Visit: Payer: Self-pay

## 2023-06-13 ENCOUNTER — Other Ambulatory Visit: Payer: Self-pay

## 2023-06-15 ENCOUNTER — Other Ambulatory Visit: Payer: Self-pay

## 2023-06-16 ENCOUNTER — Other Ambulatory Visit: Payer: Self-pay

## 2023-06-16 MED ORDER — AMOXICILLIN 500 MG PO CAPS
500.0000 mg | ORAL_CAPSULE | Freq: Three times a day (TID) | ORAL | 0 refills | Status: AC
  Filled 2023-06-16: qty 21, 7d supply, fill #0

## 2023-06-19 ENCOUNTER — Other Ambulatory Visit: Payer: Self-pay

## 2023-06-20 ENCOUNTER — Other Ambulatory Visit: Payer: Self-pay

## 2023-06-29 ENCOUNTER — Other Ambulatory Visit: Payer: Self-pay

## 2023-06-30 ENCOUNTER — Other Ambulatory Visit: Payer: Self-pay

## 2023-07-01 ENCOUNTER — Other Ambulatory Visit: Payer: Self-pay

## 2023-07-01 MED ORDER — QUETIAPINE FUMARATE 400 MG PO TABS
400.0000 mg | ORAL_TABLET | Freq: Every evening | ORAL | 2 refills | Status: DC
  Filled 2023-07-01 – 2023-11-29 (×4): qty 30, 30d supply, fill #0

## 2023-07-01 MED ORDER — QUETIAPINE FUMARATE 400 MG PO TABS
400.0000 mg | ORAL_TABLET | Freq: Every day | ORAL | 2 refills | Status: DC
  Filled 2023-07-09 – 2023-10-08 (×6): qty 30, 30d supply, fill #0
  Filled 2023-11-08: qty 30, 30d supply, fill #1
  Filled ????-??-??: fill #0

## 2023-07-01 MED ORDER — SERTRALINE HCL 100 MG PO TABS
100.0000 mg | ORAL_TABLET | Freq: Every day | ORAL | 1 refills | Status: DC
  Filled 2023-07-01: qty 90, 90d supply, fill #0
  Filled 2023-10-08: qty 90, 90d supply, fill #1

## 2023-07-03 ENCOUNTER — Other Ambulatory Visit: Payer: Self-pay

## 2023-07-04 ENCOUNTER — Other Ambulatory Visit: Payer: Self-pay

## 2023-07-05 ENCOUNTER — Other Ambulatory Visit: Payer: Self-pay

## 2023-07-06 ENCOUNTER — Other Ambulatory Visit: Payer: Self-pay

## 2023-07-06 MED ORDER — QUETIAPINE FUMARATE 200 MG PO TABS
200.0000 mg | ORAL_TABLET | Freq: Every evening | ORAL | 2 refills | Status: DC | PRN
  Filled 2023-07-06 – 2023-07-25 (×2): qty 30, 30d supply, fill #0
  Filled ????-??-??: fill #1

## 2023-07-06 MED ORDER — QUETIAPINE FUMARATE 400 MG PO TABS
400.0000 mg | ORAL_TABLET | Freq: Every day | ORAL | 2 refills | Status: DC
  Filled 2023-07-06 – 2023-07-25 (×2): qty 30, 30d supply, fill #0
  Filled 2023-08-25 – 2023-12-20 (×4): qty 30, 30d supply, fill #1

## 2023-07-07 ENCOUNTER — Other Ambulatory Visit: Payer: Self-pay

## 2023-07-07 MED ORDER — CLONAZEPAM 1 MG PO TABS
1.0000 mg | ORAL_TABLET | Freq: Three times a day (TID) | ORAL | 0 refills | Status: DC | PRN
  Filled 2023-07-07 – 2023-07-09 (×3): qty 90, 30d supply, fill #0

## 2023-07-07 MED ORDER — TRAZODONE HCL 150 MG PO TABS
150.0000 mg | ORAL_TABLET | Freq: Every day | ORAL | 11 refills | Status: DC
  Filled 2023-07-07: qty 30, 30d supply, fill #0
  Filled 2023-08-03: qty 30, 30d supply, fill #1
  Filled 2023-11-06: qty 30, 30d supply, fill #2
  Filled 2023-12-13: qty 30, 30d supply, fill #3
  Filled 2024-01-11 (×2): qty 30, 30d supply, fill #4
  Filled ????-??-??: fill #2

## 2023-07-07 MED ORDER — HYDROXYZINE PAMOATE 25 MG PO CAPS
25.0000 mg | ORAL_CAPSULE | Freq: Two times a day (BID) | ORAL | 1 refills | Status: DC
  Filled 2023-07-07: qty 30, 15d supply, fill #0
  Filled 2023-07-17 – 2023-07-19 (×2): qty 30, 15d supply, fill #1

## 2023-07-07 MED ORDER — MOUNJARO 10 MG/0.5ML ~~LOC~~ SOAJ
10.0000 mg | SUBCUTANEOUS | 3 refills | Status: DC
  Filled 2023-07-07 – 2023-08-11 (×10): qty 2, 28d supply, fill #0
  Filled 2023-09-22: qty 2, 28d supply, fill #1
  Filled 2023-11-06: qty 2, 28d supply, fill #2
  Filled 2024-01-19: qty 2, 28d supply, fill #3

## 2023-07-08 ENCOUNTER — Other Ambulatory Visit: Payer: Self-pay

## 2023-07-09 ENCOUNTER — Other Ambulatory Visit (HOSPITAL_BASED_OUTPATIENT_CLINIC_OR_DEPARTMENT_OTHER): Payer: Self-pay

## 2023-07-09 ENCOUNTER — Other Ambulatory Visit: Payer: Self-pay

## 2023-07-10 ENCOUNTER — Other Ambulatory Visit: Payer: Self-pay

## 2023-07-11 ENCOUNTER — Other Ambulatory Visit: Payer: Self-pay

## 2023-07-11 MED ORDER — GABAPENTIN 300 MG PO CAPS
600.0000 mg | ORAL_CAPSULE | Freq: Every day | ORAL | 2 refills | Status: DC
  Filled 2023-07-11 – 2023-08-09 (×3): qty 60, 30d supply, fill #0
  Filled 2023-11-06 – 2023-11-08 (×2): qty 60, 30d supply, fill #1

## 2023-07-13 ENCOUNTER — Other Ambulatory Visit: Payer: Self-pay

## 2023-07-17 ENCOUNTER — Other Ambulatory Visit: Payer: Self-pay

## 2023-07-18 ENCOUNTER — Other Ambulatory Visit: Payer: Self-pay

## 2023-07-19 ENCOUNTER — Other Ambulatory Visit: Payer: Self-pay

## 2023-07-20 ENCOUNTER — Other Ambulatory Visit: Payer: Self-pay

## 2023-07-21 ENCOUNTER — Other Ambulatory Visit: Payer: Self-pay

## 2023-07-25 ENCOUNTER — Other Ambulatory Visit: Payer: Self-pay

## 2023-08-01 ENCOUNTER — Other Ambulatory Visit: Payer: Self-pay

## 2023-08-01 MED ORDER — MOUNJARO 10 MG/0.5ML ~~LOC~~ SOAJ
10.0000 mg | SUBCUTANEOUS | 3 refills | Status: DC
  Filled 2023-08-01 – 2023-08-14 (×5): qty 2, 28d supply, fill #0
  Filled 2023-08-22: qty 2, fill #0
  Filled 2023-08-26 – 2023-09-07 (×2): qty 2, 28d supply, fill #0
  Filled ????-??-?? (×2): fill #0

## 2023-08-03 ENCOUNTER — Other Ambulatory Visit: Payer: Self-pay

## 2023-08-04 ENCOUNTER — Other Ambulatory Visit: Payer: Self-pay

## 2023-08-04 MED ORDER — CLONAZEPAM 1 MG PO TABS
1.0000 mg | ORAL_TABLET | Freq: Three times a day (TID) | ORAL | 0 refills | Status: DC | PRN
Start: 2023-08-04 — End: 2023-09-05
  Filled 2023-08-06 – 2023-08-09 (×3): qty 90, 30d supply, fill #0

## 2023-08-05 ENCOUNTER — Other Ambulatory Visit: Payer: Self-pay

## 2023-08-06 ENCOUNTER — Other Ambulatory Visit (HOSPITAL_BASED_OUTPATIENT_CLINIC_OR_DEPARTMENT_OTHER): Payer: Self-pay

## 2023-08-08 ENCOUNTER — Other Ambulatory Visit: Payer: Self-pay

## 2023-08-08 MED ORDER — GABAPENTIN 300 MG PO CAPS
600.0000 mg | ORAL_CAPSULE | Freq: Every day | ORAL | 2 refills | Status: DC
  Filled 2023-09-06: qty 60, 30d supply, fill #0

## 2023-08-08 MED ORDER — HYDROXYZINE PAMOATE 25 MG PO CAPS
25.0000 mg | ORAL_CAPSULE | Freq: Two times a day (BID) | ORAL | 1 refills | Status: DC
  Filled 2023-08-08: qty 30, 15d supply, fill #0
  Filled 2023-08-20: qty 30, 15d supply, fill #1

## 2023-08-09 ENCOUNTER — Other Ambulatory Visit: Payer: Self-pay

## 2023-08-10 ENCOUNTER — Other Ambulatory Visit: Payer: Self-pay

## 2023-08-10 MED ORDER — ROSUVASTATIN CALCIUM 20 MG PO TABS
20.0000 mg | ORAL_TABLET | Freq: Every day | ORAL | 1 refills | Status: DC
  Filled 2023-08-10: qty 90, 90d supply, fill #0
  Filled 2023-12-06 – 2023-12-13 (×2): qty 90, 90d supply, fill #1

## 2023-08-11 ENCOUNTER — Other Ambulatory Visit: Payer: Self-pay

## 2023-08-14 ENCOUNTER — Other Ambulatory Visit: Payer: Self-pay

## 2023-08-15 ENCOUNTER — Other Ambulatory Visit: Payer: Self-pay

## 2023-08-15 MED ORDER — GABAPENTIN 300 MG PO CAPS
600.0000 mg | ORAL_CAPSULE | Freq: Every day | ORAL | 2 refills | Status: DC
  Filled 2023-09-03: qty 60, 30d supply, fill #0
  Filled 2023-10-04: qty 60, 30d supply, fill #1

## 2023-08-17 ENCOUNTER — Other Ambulatory Visit: Payer: Self-pay

## 2023-08-20 ENCOUNTER — Other Ambulatory Visit: Payer: Self-pay

## 2023-08-22 ENCOUNTER — Other Ambulatory Visit: Payer: Self-pay

## 2023-08-25 ENCOUNTER — Other Ambulatory Visit: Payer: Self-pay

## 2023-08-26 ENCOUNTER — Other Ambulatory Visit: Payer: Self-pay

## 2023-08-29 ENCOUNTER — Other Ambulatory Visit: Payer: Self-pay

## 2023-08-31 ENCOUNTER — Other Ambulatory Visit: Payer: Self-pay

## 2023-09-01 ENCOUNTER — Other Ambulatory Visit: Payer: Self-pay

## 2023-09-01 MED ORDER — QUETIAPINE FUMARATE 400 MG PO TABS
400.0000 mg | ORAL_TABLET | Freq: Every day | ORAL | 2 refills | Status: DC
  Filled 2023-09-03 – 2023-09-11 (×2): qty 30, 30d supply, fill #0
  Filled 2023-12-13 – 2024-01-03 (×4): qty 30, 30d supply, fill #1
  Filled 2024-01-17 – 2024-02-01 (×2): qty 30, 30d supply, fill #2
  Filled ????-??-??: fill #0

## 2023-09-01 MED ORDER — MOUNJARO 10 MG/0.5ML ~~LOC~~ SOAJ
10.0000 mg | SUBCUTANEOUS | 3 refills | Status: DC
  Filled 2023-09-01: qty 2, 28d supply, fill #0
  Filled 2023-09-07 – 2023-09-19 (×3): qty 2, 28d supply, fill #1

## 2023-09-01 MED ORDER — ARIPIPRAZOLE 2 MG PO TABS
2.0000 mg | ORAL_TABLET | Freq: Every day | ORAL | 3 refills | Status: DC
  Filled 2023-09-01 – 2023-09-07 (×2): qty 30, 30d supply, fill #0
  Filled 2023-10-08: qty 30, 30d supply, fill #1
  Filled 2023-11-08: qty 30, 30d supply, fill #2

## 2023-09-01 MED ORDER — TRAZODONE HCL 150 MG PO TABS
150.0000 mg | ORAL_TABLET | Freq: Every day | ORAL | 11 refills | Status: DC
  Filled 2023-09-01: qty 30, 30d supply, fill #0
  Filled 2023-10-01: qty 30, 30d supply, fill #1

## 2023-09-02 ENCOUNTER — Other Ambulatory Visit: Payer: Self-pay

## 2023-09-02 MED ORDER — QUETIAPINE FUMARATE 200 MG PO TABS
200.0000 mg | ORAL_TABLET | Freq: Every evening | ORAL | 2 refills | Status: DC | PRN
  Filled 2023-09-02 – 2023-10-14 (×2): qty 30, 30d supply, fill #0
  Filled ????-??-??: fill #0

## 2023-09-03 ENCOUNTER — Other Ambulatory Visit: Payer: Self-pay

## 2023-09-04 ENCOUNTER — Other Ambulatory Visit: Payer: Self-pay

## 2023-09-05 ENCOUNTER — Other Ambulatory Visit: Payer: Self-pay

## 2023-09-05 MED ORDER — CLONAZEPAM 1 MG PO TABS
1.0000 mg | ORAL_TABLET | Freq: Three times a day (TID) | ORAL | 0 refills | Status: DC | PRN
  Filled 2023-09-06: qty 90, 30d supply, fill #0

## 2023-09-05 MED ORDER — GABAPENTIN 300 MG PO CAPS
600.0000 mg | ORAL_CAPSULE | Freq: Every day | ORAL | 2 refills | Status: DC
  Filled 2023-09-05: qty 60, 30d supply, fill #0

## 2023-09-06 ENCOUNTER — Other Ambulatory Visit: Payer: Self-pay

## 2023-09-07 ENCOUNTER — Other Ambulatory Visit: Payer: Self-pay

## 2023-09-07 ENCOUNTER — Other Ambulatory Visit: Payer: Self-pay | Admitting: Gerontology

## 2023-09-07 DIAGNOSIS — I25118 Atherosclerotic heart disease of native coronary artery with other forms of angina pectoris: Secondary | ICD-10-CM

## 2023-09-07 DIAGNOSIS — E119 Type 2 diabetes mellitus without complications: Secondary | ICD-10-CM

## 2023-09-08 ENCOUNTER — Other Ambulatory Visit: Payer: Self-pay

## 2023-09-09 ENCOUNTER — Other Ambulatory Visit: Payer: Self-pay

## 2023-09-11 ENCOUNTER — Other Ambulatory Visit: Payer: Self-pay

## 2023-09-13 ENCOUNTER — Other Ambulatory Visit: Payer: Self-pay

## 2023-09-15 ENCOUNTER — Other Ambulatory Visit: Payer: Self-pay

## 2023-09-15 MED ORDER — HYDROXYZINE PAMOATE 25 MG PO CAPS
25.0000 mg | ORAL_CAPSULE | Freq: Two times a day (BID) | ORAL | 1 refills | Status: DC
  Filled 2023-09-15: qty 30, 15d supply, fill #0
  Filled 2023-10-05: qty 30, 15d supply, fill #1
  Filled ????-??-??: fill #1

## 2023-09-19 ENCOUNTER — Other Ambulatory Visit: Payer: Self-pay

## 2023-09-20 ENCOUNTER — Other Ambulatory Visit: Payer: Self-pay

## 2023-09-21 ENCOUNTER — Other Ambulatory Visit: Payer: Self-pay

## 2023-09-22 ENCOUNTER — Other Ambulatory Visit: Payer: Self-pay

## 2023-09-22 ENCOUNTER — Other Ambulatory Visit (HOSPITAL_COMMUNITY): Payer: Self-pay

## 2023-09-23 ENCOUNTER — Other Ambulatory Visit: Payer: Self-pay

## 2023-09-28 ENCOUNTER — Other Ambulatory Visit: Payer: Self-pay

## 2023-09-28 MED ORDER — TRAMADOL HCL 100 MG PO TABS
100.0000 mg | ORAL_TABLET | Freq: Every day | ORAL | 2 refills | Status: DC | PRN
  Filled 2023-09-28 – 2023-10-01 (×3): qty 30, 30d supply, fill #0
  Filled 2023-10-14: qty 5, 5d supply, fill #0
  Filled 2023-10-14 – 2023-10-16 (×2): qty 30, 30d supply, fill #0

## 2023-09-29 ENCOUNTER — Other Ambulatory Visit: Payer: Self-pay

## 2023-10-01 ENCOUNTER — Other Ambulatory Visit (HOSPITAL_BASED_OUTPATIENT_CLINIC_OR_DEPARTMENT_OTHER): Payer: Self-pay

## 2023-10-01 ENCOUNTER — Other Ambulatory Visit: Payer: Self-pay

## 2023-10-03 ENCOUNTER — Other Ambulatory Visit: Payer: Self-pay

## 2023-10-04 ENCOUNTER — Other Ambulatory Visit: Payer: Self-pay

## 2023-10-05 ENCOUNTER — Other Ambulatory Visit: Payer: Self-pay

## 2023-10-05 MED ORDER — CLONAZEPAM 1 MG PO TABS
1.0000 mg | ORAL_TABLET | Freq: Three times a day (TID) | ORAL | 0 refills | Status: DC | PRN
  Filled 2023-10-05: qty 90, 30d supply, fill #0

## 2023-10-08 ENCOUNTER — Other Ambulatory Visit (HOSPITAL_BASED_OUTPATIENT_CLINIC_OR_DEPARTMENT_OTHER): Payer: Self-pay

## 2023-10-08 ENCOUNTER — Other Ambulatory Visit: Payer: Self-pay

## 2023-10-14 ENCOUNTER — Other Ambulatory Visit: Payer: Self-pay

## 2023-10-16 ENCOUNTER — Other Ambulatory Visit: Payer: Self-pay

## 2023-10-17 ENCOUNTER — Other Ambulatory Visit: Payer: Self-pay

## 2023-10-17 MED ORDER — LOSARTAN POTASSIUM 100 MG PO TABS
100.0000 mg | ORAL_TABLET | Freq: Every day | ORAL | 0 refills | Status: DC
  Filled 2023-10-17: qty 90, 90d supply, fill #0

## 2023-10-21 ENCOUNTER — Other Ambulatory Visit: Payer: Self-pay

## 2023-10-24 ENCOUNTER — Other Ambulatory Visit: Payer: Self-pay

## 2023-10-24 MED ORDER — MOUNJARO 12.5 MG/0.5ML ~~LOC~~ SOAJ
12.5000 mg | SUBCUTANEOUS | 5 refills | Status: DC
  Filled 2023-10-24: qty 2, 28d supply, fill #0
  Filled 2024-01-02: qty 2, 28d supply, fill #1
  Filled 2024-02-14: qty 2, 28d supply, fill #2
  Filled 2024-03-13: qty 2, 28d supply, fill #3
  Filled ????-??-??: fill #4

## 2023-10-24 MED ORDER — TRAMADOL HCL 50 MG PO TABS
50.0000 mg | ORAL_TABLET | Freq: Two times a day (BID) | ORAL | 3 refills | Status: DC | PRN
  Filled 2023-10-24 – 2023-10-27 (×4): qty 60, 30d supply, fill #0
  Filled 2023-12-14: qty 60, 30d supply, fill #1
  Filled 2024-02-08: qty 60, 30d supply, fill #2

## 2023-10-25 ENCOUNTER — Other Ambulatory Visit: Payer: Self-pay

## 2023-10-27 ENCOUNTER — Other Ambulatory Visit: Payer: Self-pay

## 2023-11-01 ENCOUNTER — Other Ambulatory Visit: Payer: Self-pay

## 2023-11-01 MED ORDER — HYDROXYZINE PAMOATE 25 MG PO CAPS
25.0000 mg | ORAL_CAPSULE | Freq: Two times a day (BID) | ORAL | 1 refills | Status: DC
  Filled 2023-11-01: qty 30, 15d supply, fill #0
  Filled 2023-12-06: qty 30, 15d supply, fill #1

## 2023-11-01 MED ORDER — CLONAZEPAM 1 MG PO TABS
1.0000 mg | ORAL_TABLET | Freq: Three times a day (TID) | ORAL | 0 refills | Status: DC | PRN
  Filled 2023-11-03: qty 90, 30d supply, fill #0

## 2023-11-02 ENCOUNTER — Other Ambulatory Visit: Payer: Self-pay

## 2023-11-02 MED ORDER — GABAPENTIN 300 MG PO CAPS
300.0000 mg | ORAL_CAPSULE | Freq: Two times a day (BID) | ORAL | 0 refills | Status: DC
  Filled 2023-11-02 (×2): qty 60, 30d supply, fill #0

## 2023-11-03 ENCOUNTER — Other Ambulatory Visit: Payer: Self-pay

## 2023-11-06 ENCOUNTER — Other Ambulatory Visit: Payer: Self-pay

## 2023-11-07 ENCOUNTER — Other Ambulatory Visit: Payer: Self-pay

## 2023-11-08 ENCOUNTER — Other Ambulatory Visit: Payer: Self-pay

## 2023-11-15 ENCOUNTER — Other Ambulatory Visit: Payer: Self-pay

## 2023-11-22 ENCOUNTER — Other Ambulatory Visit: Payer: Self-pay

## 2023-11-22 MED ORDER — GABAPENTIN 300 MG PO CAPS
600.0000 mg | ORAL_CAPSULE | Freq: Three times a day (TID) | ORAL | 0 refills | Status: DC
  Filled 2023-11-22 (×2): qty 180, 30d supply, fill #0

## 2023-11-29 ENCOUNTER — Other Ambulatory Visit: Payer: Self-pay

## 2023-11-30 ENCOUNTER — Other Ambulatory Visit: Payer: Self-pay

## 2023-11-30 MED ORDER — CLONAZEPAM 1 MG PO TABS
1.0000 mg | ORAL_TABLET | Freq: Three times a day (TID) | ORAL | 0 refills | Status: DC | PRN
  Filled 2023-12-01: qty 90, 30d supply, fill #0
  Filled ????-??-??: fill #0

## 2023-12-01 ENCOUNTER — Other Ambulatory Visit: Payer: Self-pay

## 2023-12-06 ENCOUNTER — Other Ambulatory Visit: Payer: Self-pay

## 2023-12-06 MED ORDER — GABAPENTIN 300 MG PO CAPS
600.0000 mg | ORAL_CAPSULE | Freq: Three times a day (TID) | ORAL | 5 refills | Status: DC
  Filled 2023-12-20: qty 180, 30d supply, fill #0

## 2023-12-08 ENCOUNTER — Other Ambulatory Visit: Payer: Self-pay

## 2023-12-08 MED ORDER — BELSOMRA 10 MG PO TABS
10.0000 mg | ORAL_TABLET | Freq: Every day | ORAL | 1 refills | Status: DC
  Filled 2023-12-08 – 2023-12-14 (×6): qty 30, 30d supply, fill #0
  Filled 2023-12-16: qty 15, 30d supply, fill #0
  Filled 2023-12-16: qty 10, 10d supply, fill #0

## 2023-12-09 ENCOUNTER — Other Ambulatory Visit: Payer: Self-pay

## 2023-12-09 ENCOUNTER — Other Ambulatory Visit: Payer: Self-pay | Admitting: Internal Medicine

## 2023-12-09 DIAGNOSIS — F17210 Nicotine dependence, cigarettes, uncomplicated: Secondary | ICD-10-CM

## 2023-12-09 DIAGNOSIS — F5104 Psychophysiologic insomnia: Secondary | ICD-10-CM

## 2023-12-12 ENCOUNTER — Other Ambulatory Visit: Payer: Self-pay

## 2023-12-13 ENCOUNTER — Other Ambulatory Visit: Payer: Self-pay

## 2023-12-13 MED ORDER — HYDROXYZINE PAMOATE 25 MG PO CAPS
25.0000 mg | ORAL_CAPSULE | Freq: Two times a day (BID) | ORAL | 1 refills | Status: DC
  Filled 2023-12-20: qty 30, 15d supply, fill #0

## 2023-12-14 ENCOUNTER — Other Ambulatory Visit: Payer: Self-pay

## 2023-12-14 ENCOUNTER — Encounter: Payer: Self-pay | Admitting: Cardiology

## 2023-12-14 MED ORDER — TRAMADOL HCL 50 MG PO TABS
50.0000 mg | ORAL_TABLET | Freq: Two times a day (BID) | ORAL | 3 refills | Status: DC | PRN
  Filled 2023-12-14 – 2024-02-14 (×2): qty 60, 30d supply, fill #0
  Filled 2024-04-04: qty 60, 30d supply, fill #1
  Filled 2024-05-02: qty 60, 30d supply, fill #2
  Filled ????-??-??: fill #2

## 2023-12-16 ENCOUNTER — Other Ambulatory Visit (HOSPITAL_COMMUNITY): Payer: Self-pay

## 2023-12-16 ENCOUNTER — Other Ambulatory Visit: Payer: Self-pay

## 2023-12-21 ENCOUNTER — Other Ambulatory Visit: Payer: Self-pay

## 2023-12-22 ENCOUNTER — Other Ambulatory Visit: Payer: Self-pay

## 2023-12-22 MED ORDER — BELSOMRA 10 MG PO TABS: 10.0000 mg | ORAL_TABLET | Freq: Every day | ORAL | 1 refills | Status: DC

## 2023-12-23 ENCOUNTER — Ambulatory Visit: Payer: MEDICAID

## 2023-12-29 ENCOUNTER — Other Ambulatory Visit: Payer: Self-pay

## 2023-12-29 MED ORDER — DOXEPIN HCL 25 MG PO CAPS
50.0000 mg | ORAL_CAPSULE | Freq: Every day | ORAL | 2 refills | Status: DC
  Filled 2023-12-29: qty 60, 30d supply, fill #0
  Filled 2024-01-16 – 2024-01-23 (×2): qty 60, 30d supply, fill #1
  Filled 2024-02-22: qty 60, 30d supply, fill #2

## 2023-12-29 MED ORDER — CLONAZEPAM 1 MG PO TABS
1.0000 mg | ORAL_TABLET | Freq: Three times a day (TID) | ORAL | 0 refills | Status: DC
  Filled 2023-12-29: qty 90, 30d supply, fill #0

## 2024-01-02 ENCOUNTER — Other Ambulatory Visit: Payer: Self-pay

## 2024-01-03 ENCOUNTER — Ambulatory Visit: Payer: MEDICAID | Attending: Internal Medicine

## 2024-01-03 ENCOUNTER — Other Ambulatory Visit: Payer: Self-pay

## 2024-01-11 ENCOUNTER — Other Ambulatory Visit: Payer: Self-pay

## 2024-01-11 ENCOUNTER — Other Ambulatory Visit (HOSPITAL_COMMUNITY): Payer: Self-pay

## 2024-01-11 MED ORDER — HYDROXYZINE PAMOATE 25 MG PO CAPS
25.0000 mg | ORAL_CAPSULE | Freq: Two times a day (BID) | ORAL | 1 refills | Status: DC
  Filled 2024-01-11: qty 30, 15d supply, fill #0
  Filled 2024-02-01: qty 30, 15d supply, fill #1

## 2024-01-11 MED ORDER — TRAZODONE HCL 150 MG PO TABS
150.0000 mg | ORAL_TABLET | Freq: Every day | ORAL | 1 refills | Status: DC
  Filled 2024-01-11 (×3): qty 60, 60d supply, fill #0
  Filled 2024-01-11: qty 30, 30d supply, fill #0
  Filled 2024-01-11: qty 90, 90d supply, fill #0
  Filled 2024-01-11: qty 30, 30d supply, fill #0
  Filled 2024-05-02: qty 90, 90d supply, fill #1

## 2024-01-11 MED ORDER — METFORMIN HCL 500 MG PO TABS
500.0000 mg | ORAL_TABLET | Freq: Every day | ORAL | 1 refills | Status: DC
  Filled 2024-01-11: qty 90, 90d supply, fill #0
  Filled 2024-05-02: qty 90, 90d supply, fill #1

## 2024-01-12 ENCOUNTER — Other Ambulatory Visit: Payer: Self-pay

## 2024-01-13 ENCOUNTER — Other Ambulatory Visit: Payer: Self-pay

## 2024-01-16 ENCOUNTER — Other Ambulatory Visit: Payer: Self-pay

## 2024-01-16 ENCOUNTER — Other Ambulatory Visit (HOSPITAL_COMMUNITY): Payer: Self-pay

## 2024-01-16 MED ORDER — ROSUVASTATIN CALCIUM 20 MG PO TABS
20.0000 mg | ORAL_TABLET | Freq: Every day | ORAL | 1 refills | Status: DC
  Filled 2024-01-17 – 2024-03-11 (×2): qty 90, 90d supply, fill #0

## 2024-01-17 ENCOUNTER — Other Ambulatory Visit: Payer: Self-pay

## 2024-01-17 ENCOUNTER — Ambulatory Visit (INDEPENDENT_AMBULATORY_CARE_PROVIDER_SITE_OTHER): Payer: MEDICAID | Admitting: Student in an Organized Health Care Education/Training Program

## 2024-01-17 ENCOUNTER — Encounter: Payer: Self-pay | Admitting: Student in an Organized Health Care Education/Training Program

## 2024-01-17 VITALS — BP 148/88 | HR 65 | Temp 97.5°F | Ht 68.0 in | Wt 225.6 lb

## 2024-01-17 DIAGNOSIS — F17210 Nicotine dependence, cigarettes, uncomplicated: Secondary | ICD-10-CM

## 2024-01-17 DIAGNOSIS — R0602 Shortness of breath: Secondary | ICD-10-CM

## 2024-01-17 MED ORDER — LIDOCAINE 5 % EX PTCH
1.0000 | MEDICATED_PATCH | CUTANEOUS | 3 refills | Status: DC
  Filled 2024-01-17 – 2024-01-18 (×3): qty 30, 30d supply, fill #0
  Filled 2024-04-04: qty 30, 30d supply, fill #1

## 2024-01-17 NOTE — Progress Notes (Signed)
 Assessment & Plan:   1. Shortness of breath (Primary)  He presents for the evaluation of shortness of breath over the past few months in the setting of significant history of smoking.  He also has an interlock device requirement that he is unable to meet that he is requesting an evaluation for.  I will obtain pulmonary function testing today and we will see him back for follow-up after completion to assess whether he would be able to meet the interlock requirements.  After this, he would need to be seen by a second physician with a separate PFT on record as well.  - Pulmonary Function Test; Future   Return in about 4 weeks (around 02/14/2024).  I spent 45 minutes caring for this patient today, including preparing to see the patient, obtaining a medical history , reviewing a separately obtained history, performing a medically appropriate examination and/or evaluation, counseling and educating the patient/family/caregiver, ordering medications, tests, or procedures, documenting clinical information in the electronic health record, and independently interpreting results (not separately reported/billed) and communicating results to the patient/family/caregiver  [name], MD [facility]   End of visit medications:  No orders of the defined types were placed in this encounter.    Current Outpatient Medications:    aspirin  81 MG EC tablet, TAKE ONE TABLET BY MOUTH EVERY DAY, Disp: 90 tablet, Rfl: 0   clonazePAM  (KLONOPIN ) 1 MG tablet, Take 1 mg by mouth 2 (two) times daily as needed for anxiety. PRN in the afternoon, Disp: , Rfl:    clonazePAM  (KLONOPIN ) 1 MG tablet, Take 1 tablet (1 mg total) by mouth 3 (three) times daily as needed., Disp: 90 tablet, Rfl: 0   clonazePAM  (KLONOPIN ) 1 MG tablet, Take 1 tablet (1 mg total) by mouth 3 (three) times daily., Disp: 90 tablet, Rfl: 0   clopidogrel  (PLAVIX ) 75 MG tablet, Take 1 tablet (75 mg total) by mouth daily., Disp:  30 tablet, Rfl: 5   clopidogrel  (PLAVIX ) 75 MG tablet, Take 1 tablet (75 mg total) by mouth daily., Disp: 30 tablet, Rfl: 5   clopidogrel  (PLAVIX ) 75 MG tablet, Take 1 tablet (75 mg total) by mouth daily., Disp: 90 tablet, Rfl: 0   diphenhydrAMINE  (BENADRYL ) 50 MG capsule, Take 50 mg by mouth as needed. Taking ~every 3 days as needed, Disp: , Rfl:    doxepin  (SINEQUAN ) 25 MG capsule, Take 1 to 2 capsules (25 to 50 mg total) by mouth at bedtime., Disp: 60 capsule, Rfl: 2   gabapentin  (NEURONTIN ) 300 MG capsule, Take 2 capsules (600 mg total) by mouth at bedtime., Disp: 60 capsule, Rfl: 2   gabapentin  (NEURONTIN ) 300 MG capsule, Take 2 capsules (600 mg total) by mouth at bedtime., Disp: 60 capsule, Rfl: 2   gabapentin  (NEURONTIN ) 300 MG capsule, Take 2 capsules (600 mg total) by mouth at bedtime., Disp: 60 capsule, Rfl: 2   gabapentin  (NEURONTIN ) 300 MG capsule, Take 2 capsules (600 mg total) by mouth at bedtime., Disp: 60 capsule, Rfl: 2   gabapentin  (NEURONTIN ) 300 MG capsule, Take 2 capsules (600 mg total) by mouth at bedtime., Disp: 60 capsule, Rfl: 2   gabapentin  (NEURONTIN ) 300 MG capsule, Take 1 capsule (300 mg total) by mouth 2 (two) times daily in the morning and in the afternoon., Disp: 60 capsule, Rfl: 0   gabapentin  (NEURONTIN ) 300 MG capsule, Take 2 capsules (600 mg total) by mouth 3 (three) times daily., Disp: 180 capsule, Rfl: 0   gabapentin  (NEURONTIN ) 300  MG capsule, Take 2 capsules (600 mg total) by mouth 3 (three) times daily., Disp: 180 capsule, Rfl: 5   hydrOXYzine  (VISTARIL ) 25 MG capsule, Take 1 capsule (25 mg total) by mouth 2 (two) times daily., Disp: 30 capsule, Rfl: 1   hydrOXYzine  (VISTARIL ) 25 MG capsule, Take 1 capsule (25 mg total) by mouth 2 (two) times daily., Disp: 30 capsule, Rfl: 1   lidocaine  (LIDODERM ) 5 %, Place 1 patch onto the skin daily Apply patch to the most painful area for up to 12 hours in a 24 hour period., Disp: 30 patch, Rfl: 3   losartan  (COZAAR ) 100  MG tablet, Take 1 tablet (100 mg total) by mouth daily., Disp: 90 tablet, Rfl: 0   metFORMIN  (GLUCOPHAGE ) 500 MG tablet, Take 1 tablet (500 mg total) by mouth daily with breakfast., Disp: 90 tablet, Rfl: 1   metoprolol  tartrate (LOPRESSOR ) 25 MG tablet, Take 1 tablet (25 mg total) by mouth 2 (two) times daily., Disp: 120 tablet, Rfl: 1   metoprolol  tartrate (LOPRESSOR ) 25 MG tablet, Take 1 tablet (25 mg total) by mouth 2 (two) times daily, Disp: 120 tablet, Rfl: 1   metoprolol  tartrate (LOPRESSOR ) 25 MG tablet, Take 1 tablet (25 mg total) by mouth 2 (two) times daily, Disp: 120 tablet, Rfl: 1   metoprolol  tartrate (LOPRESSOR ) 25 MG tablet, Take 1 tablet (25 mg total) by mouth 2 (two) times daily., Disp: 180 tablet, Rfl: 0   QUEtiapine  (SEROQUEL ) 200 MG tablet, Take 1 tablet (200 mg total) by mouth at bedtime as needed (insomnia)., Disp: 30 tablet, Rfl: 2   QUEtiapine  (SEROQUEL ) 200 MG tablet, Take 1 tablet (200 mg total) by mouth at bedtime as needed for insomnia., Disp: 30 tablet, Rfl: 2   QUEtiapine  (SEROQUEL ) 200 MG tablet, Take 1 tablet (200 mg total) by mouth at bedtime as needed (insomnia) for up to 90 days, Disp: 30 tablet, Rfl: 2   QUEtiapine  (SEROQUEL ) 200 MG tablet, Take 1 tablet (200 mg total) by mouth at bedtime as needed for up to 90 days., Disp: 30 tablet, Rfl: 2   QUEtiapine  (SEROQUEL ) 400 MG tablet, Take 1 tablet (400 mg total) by mouth at bedtime., Disp: 30 tablet, Rfl: 3   QUEtiapine  (SEROQUEL ) 400 MG tablet, Take 1 tablet (400 mg total) by mouth at bedtime., Disp: 30 tablet, Rfl: 2   QUEtiapine  (SEROQUEL ) 400 MG tablet, Take 1 tablet (400 mg total) by mouth at bedtime., Disp: 30 tablet, Rfl: 2   QUEtiapine  (SEROQUEL ) 400 MG tablet, Take 1 tablet (400 mg total) by mouth at bedtime., Disp: 30 tablet, Rfl: 2   QUEtiapine  (SEROQUEL ) 400 MG tablet, Take 1 tablet (400 mg total) by mouth at bedtime., Disp: 30 tablet, Rfl: 2   QUEtiapine  (SEROQUEL ) 400 MG tablet, Take 1 tablet (400 mg  total) by mouth at bedtime., Disp: 30 tablet, Rfl: 2   QUEtiapine  (SEROQUEL ) 400 MG tablet, Take 1 tablet (400 mg total) by mouth at bedtime for 90 days., Disp: 30 tablet, Rfl: 2   QUEtiapine  (SEROQUEL ) 400 MG tablet, Take 1 tablet (400 mg total) by mouth at bedtime, Disp: 30 tablet, Rfl: 2   rosuvastatin  (CRESTOR ) 20 MG tablet, Take 1 tablet (20 mg total) by mouth daily., Disp: 90 tablet, Rfl: 1   rosuvastatin  (CRESTOR ) 20 MG tablet, Take 1 tablet (20 mg total) by mouth daily., Disp: 90 tablet, Rfl: 1   tirzepatide  (MOUNJARO ) 10 MG/0.5ML Pen, Inject 10 mg into the skin once a week., Disp: 2 mL, Rfl: 3  tirzepatide  (MOUNJARO ) 10 MG/0.5ML Pen, Inject 10 mg into the skin once a week., Disp: 2 mL, Rfl: 3   tirzepatide  (MOUNJARO ) 12.5 MG/0.5ML Pen, Inject 12.5 mg into the skin once a week., Disp: 2 mL, Rfl: 5   traMADol  (ULTRAM ) 50 MG tablet, Take 50 mg by mouth 3 (three) times daily as needed for moderate pain. Takes 2 tablets in AM, Disp: , Rfl:    traMADol  (ULTRAM ) 50 MG tablet, Take 1 tablet (50 mg total) by mouth 2 (two) times daily as needed., Disp: 60 tablet, Rfl: 3   traMADol  (ULTRAM ) 50 MG tablet, Take 1 tablet (50 mg total) by mouth 2 (two) times daily as needed for Pain for up to 30 days, Disp: 60 tablet, Rfl: 3   traMADol  (ULTRAM ) 50 MG tablet, Take 1 tablet (50 mg total) by mouth 2 (two) times daily as needed., Disp: 60 tablet, Rfl: 3   traZODone  (DESYREL ) 150 MG tablet, Take 1 tablet (150 mg total) by mouth at bedtime., Disp: 30 tablet, Rfl: 11   traZODone  (DESYREL ) 150 MG tablet, Take 1 tablet (150 mg total) by mouth at bedtime., Disp: 90 tablet, Rfl: 1   Dulaglutide  (TRULICITY ) 4.5 MG/0.5ML SOPN, Inject 4.5mg  under skin once a week (Patient not taking: Reported on 01/17/2024), Disp: 8 mL, Rfl: 3   sertraline  (ZOLOFT ) 50 MG tablet, Take 1 tablet (50 mg total) by mouth daily. (Patient not taking: Reported on 01/17/2024), Disp: 90 tablet, Rfl: 1   Suvorexant  (BELSOMRA ) 10 MG TABS, Take 1  tablet (10 mg total) by mouth at bedtime. (Patient not taking: Reported on 01/17/2024), Disp: 30 tablet, Rfl: 1   traZODone  (DESYREL ) 100 MG tablet, Take 1 tablet (100 mg total) by mouth at bedtime. (Patient not taking: Reported on 01/17/2024), Disp: 90 tablet, Rfl: 1   traZODone  (DESYREL ) 100 MG tablet, Take 1 tablet (100 mg total) by mouth at bedtime. (Patient not taking: Reported on 01/17/2024), Disp: 90 tablet, Rfl: 1   Subjective:   PATIENT ID: Larry French Masters GENDER: male DOB: [birth year], MRN: [id number]  Chief Complaint  Patient presents with   Shortness of Breath    Here for interlock device.  DOE. Wheezing. Cough with light green.     HPI  Patient is a pleasant 63 year old male presenting to clinic for the evaluation of shortness of breath.  He also is also requesting evaluation for interlock device exemption as requested by the DMV.  Patient reports symptoms of shortness of breath over the past few months.  He has felt increased dyspnea mostly with exertion over the past 6 months which he feels is associated with increased weight and central obesity.  He has an occasional cough and occasional wheezing but otherwise denies any chest pain or chest tightness.  He has not had the symptoms in the past.  Patient has a DWI which necessitates an interlock device.  He feels he was unable to generate the flows necessary for the interlock device. He needs to visit   He reports a history of smoking, with around 40 to 50 pack years of smoking history. While he's had a DWI, he denies heavy drinking or daily alcohol use. He denies any history of alcohol withdrawal.  Ancillary information including prior medications, full medical/surgical/family/social histories, and PFTs (when available) are listed below and have been reviewed.    Review of Systems  Constitutional:  Negative for chills, fever and weight loss.  Respiratory:  Positive for cough, hemoptysis and shortness of breath. Negative  for sputum  production.   Cardiovascular:  Negative for chest pain.     Objective:   Vitals:   01/17/24 1434  BP: (!) 148/88  Pulse: 65  Temp: (!) 97.5 F (36.4 C)  SpO2: 98%  Weight: 225 lb 9.6 oz (102.3 kg)  Height: 5' 8 (1.727 m)   98% on RA BMI Readings from Last 3 Encounters:  01/17/24 34.30 kg/m  02/11/22 29.95 kg/m  11/11/21 28.08 kg/m   Wt Readings from Last 3 Encounters:  01/17/24 225 lb 9.6 oz (102.3 kg)  02/11/22 197 lb (89.4 kg)  11/11/21 184 lb 11.2 oz (83.8 kg)    Physical Exam Constitutional:      Appearance: Normal appearance.  Cardiovascular:     Rate and Rhythm: Normal rate and regular rhythm.     Pulses: Normal pulses.     Heart sounds: Normal heart sounds.  Pulmonary:     Effort: Pulmonary effort is normal.     Breath sounds: Normal breath sounds. No wheezing.  Abdominal:     General: There is distension.  Neurological:     General: No focal deficit present.     Mental Status: He is alert and oriented to person, place, and time. Mental status is at baseline.       Ancillary Information    Past Medical History:  Diagnosis Date   Alcohol abuse    Recovered x 15 months   Anxiety    CA - skin cancer    CAD, residual CFX LAD disease 06/01/2011   a. s/p inferior ST elevation MI s/p PCI/DES to RCA on 05/29/2011; b. residual LAD and LCx CAD tx'd on 06/02/11 cath with LAD CAD successfully tx'd w/ PCI/DES, LCx managaged medically    Depression    Headache(784.0)    Hyperlipemia    Hypertension    Insomnia    Mental disorder    MI (myocardial infarction) (HCC)    Polysubstance abuse (HCC)    a. etoh, xanax , tobacco   PTSD (post-traumatic stress disorder)      Family History  Problem Relation Age of Onset   Chronic Renal Failure Mother    Cancer Mother        Non-Hodgkin lymphoma   Depression Mother    Anxiety disorder Mother    Depression Father    Anxiety disorder Father    Other Brother        MVA   Coronary artery disease  Maternal Grandfather 49       Died     Past Surgical History:  Procedure Laterality Date   CARDIAC CATHETERIZATION Bilateral 04/09/2015   Procedure: Coronary Angiogram;  Surgeon: Deatrice DELENA Cage, MD;  Location: [facility] INVASIVE CV LAB;  Service: Cardiovascular;  Laterality: Bilateral;   CARDIAC CATHETERIZATION [name] 04/09/2015   Procedure: Coronary Stent Intervention;  Surgeon: Deatrice DELENA Cage, MD;  Location: [facility] INVASIVE CV LAB;  Service: Cardiovascular;  Laterality: [name];   COLONOSCOPY WITH PROPOFOL  [name] 03/19/2019   Procedure: COLONOSCOPY WITH PROPOFOL ;  Surgeon: Unk Corinn Skiff, MD;  Location: [facility] ENDOSCOPY;  Service: Gastroenterology;  Laterality: [name];   LEFT HEART CATH Right 05/29/2011   Procedure: LEFT HEART CATH;  Surgeon: Alm LELON Clay, MD;  [facility] CATH LAB;  Service: Cardiovascular;  Laterality: Right;   LEFT HEART CATHETERIZATION WITH CORONARY ANGIOGRAM [name] 06/01/2011   Procedure: LEFT HEART CATHETERIZATION WITH CORONARY ANGIOGRAM;  Surgeon: Alm LELON Clay, MD;  Location: [facility] CATH LAB;  Service: Cardiovascular;  Laterality: [name];  relook cath, possible PCI  TONSILLECTOMY AND ADENOIDECTOMY      Social History   Socioeconomic History   Marital status: Single    Spouse name: Not on file   Number of children: 0   Years of education: Not on file   Highest education level: Not on file  Occupational History   Occupation: Personal Shopper    Employer: [employer]  Tobacco Use   Smoking status: Every Day    Current packs/day: 1.50    Average packs/day: 1.5 packs/day for 30.0 years (45.0 ttl pk-yrs)    Types: Cigarettes   Smokeless tobacco: Never   Tobacco comments:    Smokes 0.75 PPD- khj 01/17/2024  Vaping Use   Vaping status: Never Used  Substance and Sexual Activity   Alcohol use: No    Comment: Previous user (sober x 5 years)   Drug use: No   Sexual activity: Not Currently  Other Topics Concern   Not on file  Social History Narrative   Not on file    Social Drivers of Health   Financial Resource Strain: Low Risk  (01/11/2024)   Received from [facility]   Overall Financial Resource Strain (CARDIA)    Difficulty of Paying Living Expenses: Not hard at all  Food Insecurity: No Food Insecurity (01/11/2024)   Received from [facility]   Hunger Vital Sign    Within the past 12 months, you worried that your food would run out before you got the money to buy more.: Never true    Within the past 12 months, the food you bought just didn't last and you didn't have money to get more.: Never true  Recent Concern: Food Insecurity - Food Insecurity Present (11/02/2023)   Received from [facility]   Hunger Vital Sign    Within the past 12 months, you worried that your food would run out before you got the money to buy more.: Often true    Within the past 12 months, the food you bought just didn't last and you didn't have money to get more.: Sometimes true  Transportation Needs: No Transportation Needs (01/11/2024)   Received from [facility] - Transportation    In the past 12 months, has lack of transportation kept you from medical appointments or from getting medications?: No    Lack of Transportation (Non-Medical): No  Recent Concern: Transportation Needs - Unmet Transportation Needs (11/02/2023)   Received from [facility] - Transportation    In the past 12 months, has lack of transportation kept you from medical appointments or from getting medications?: No    Lack of Transportation (Non-Medical): Yes  Physical Activity: Sufficiently Active (02/11/2022)   Exercise Vital Sign    Days of Exercise per Week: 7 days    Minutes of Exercise per Session: 60 min  Stress: Stress Concern Present (02/11/2022)   [employer] of Occupational Health - Occupational Stress Questionnaire    Feeling of Stress : To some extent  Social Connections:  Moderately Isolated (02/11/2022)   Social Connection and Isolation Panel    Frequency of Communication with Friends and Family: More than three times a week    Frequency of Social Gatherings with Friends and Family: Once a week    Attends Religious Services: 1 to 4 times per year    Active Member of [employer] or Organizations: No    Attends Banker Meetings: Never    Marital Status:  Never married  Intimate Partner Violence: Not At Risk (02/11/2022)   Humiliation, Afraid, Rape, and Kick questionnaire    Fear of Current or Ex-Partner: No    Emotionally Abused: No    Physically Abused: No    Sexually Abused: No     No Known Allergies   CBC    Component Value Date/Time   WBC 10.3 03/02/2018 1823   WBC 17.0 (H) 06/18/2015 2201   RBC 5.81 (H) 03/02/2018 1823   RBC 4.89 06/18/2015 2201   HGB 17.7 03/02/2018 1823   HCT 50.2 03/02/2018 1823   PLT 254 03/02/2018 1823   MCV 86 03/02/2018 1823   MCV 99 03/07/2014 0930   MCH 30.5 03/02/2018 1823   MCH 32.1 06/18/2015 2201   MCHC 35.3 03/02/2018 1823   MCHC 33.5 06/18/2015 2201   RDW 13.0 03/02/2018 1823   RDW 14.1 03/07/2014 0930   LYMPHSABS 1.5 04/19/2015 1438   LYMPHSABS 3.1 03/04/2014 1720   MONOABS 0.6 04/19/2015 1438   MONOABS 1.0 03/04/2014 1720   EOSABS 0.1 04/19/2015 1438   EOSABS 0.0 03/04/2014 1720   BASOSABS 0.0 04/19/2015 1438   BASOSABS 0.1 03/04/2014 1720    Pulmonary Functions Testing Results:     No data to display          Outpatient Medications Prior to Visit  Medication Sig Dispense Refill   aspirin  81 MG EC tablet TAKE ONE TABLET BY MOUTH EVERY DAY 90 tablet 0   clonazePAM  (KLONOPIN ) 1 MG tablet Take 1 mg by mouth 2 (two) times daily as needed for anxiety. PRN in the afternoon     clonazePAM  (KLONOPIN ) 1 MG tablet Take 1 tablet (1 mg total) by mouth 3 (three) times daily as needed. 90 tablet 0   clonazePAM  (KLONOPIN ) 1 MG tablet Take 1 tablet (1 mg total) by mouth 3 (three) times daily. 90  tablet 0   clopidogrel  (PLAVIX ) 75 MG tablet Take 1 tablet (75 mg total) by mouth daily. 30 tablet 5   clopidogrel  (PLAVIX ) 75 MG tablet Take 1 tablet (75 mg total) by mouth daily. 30 tablet 5   clopidogrel  (PLAVIX ) 75 MG tablet Take 1 tablet (75 mg total) by mouth daily. 90 tablet 0   diphenhydrAMINE  (BENADRYL ) 50 MG capsule Take 50 mg by mouth as needed. Taking ~every 3 days as needed     doxepin  (SINEQUAN ) 25 MG capsule Take 1 to 2 capsules (25 to 50 mg total) by mouth at bedtime. 60 capsule 2   gabapentin  (NEURONTIN ) 300 MG capsule Take 2 capsules (600 mg total) by mouth at bedtime. 60 capsule 2   gabapentin  (NEURONTIN ) 300 MG capsule Take 2 capsules (600 mg total) by mouth at bedtime. 60 capsule 2   gabapentin  (NEURONTIN ) 300 MG capsule Take 2 capsules (600 mg total) by mouth at bedtime. 60 capsule 2   gabapentin  (NEURONTIN ) 300 MG capsule Take 2 capsules (600 mg total) by mouth at bedtime. 60 capsule 2   gabapentin  (NEURONTIN ) 300 MG capsule Take 2 capsules (600 mg total) by mouth at bedtime. 60 capsule 2   gabapentin  (NEURONTIN ) 300 MG capsule Take 1 capsule (300 mg total) by mouth 2 (two) times daily in the morning and in the afternoon. 60 capsule 0   gabapentin  (NEURONTIN ) 300 MG capsule Take 2 capsules (600 mg total) by mouth 3 (three) times daily. 180 capsule 0   gabapentin  (NEURONTIN ) 300 MG capsule Take 2 capsules (600 mg total) by mouth 3 (three) times daily. 180 capsule  5   hydrOXYzine  (VISTARIL ) 25 MG capsule Take 1 capsule (25 mg total) by mouth 2 (two) times daily. 30 capsule 1   hydrOXYzine  (VISTARIL ) 25 MG capsule Take 1 capsule (25 mg total) by mouth 2 (two) times daily. 30 capsule 1   lidocaine  (LIDODERM ) 5 % Place 1 patch onto the skin daily Apply patch to the most painful area for up to 12 hours in a 24 hour period. 30 patch 3   losartan  (COZAAR ) 100 MG tablet Take 1 tablet (100 mg total) by mouth daily. 90 tablet 0   metFORMIN  (GLUCOPHAGE ) 500 MG tablet Take 1 tablet (500  mg total) by mouth daily with breakfast. 90 tablet 1   metoprolol  tartrate (LOPRESSOR ) 25 MG tablet Take 1 tablet (25 mg total) by mouth 2 (two) times daily. 120 tablet 1   metoprolol  tartrate (LOPRESSOR ) 25 MG tablet Take 1 tablet (25 mg total) by mouth 2 (two) times daily 120 tablet 1   metoprolol  tartrate (LOPRESSOR ) 25 MG tablet Take 1 tablet (25 mg total) by mouth 2 (two) times daily 120 tablet 1   metoprolol  tartrate (LOPRESSOR ) 25 MG tablet Take 1 tablet (25 mg total) by mouth 2 (two) times daily. 180 tablet 0   QUEtiapine  (SEROQUEL ) 200 MG tablet Take 1 tablet (200 mg total) by mouth at bedtime as needed (insomnia). 30 tablet 2   QUEtiapine  (SEROQUEL ) 200 MG tablet Take 1 tablet (200 mg total) by mouth at bedtime as needed for insomnia. 30 tablet 2   QUEtiapine  (SEROQUEL ) 200 MG tablet Take 1 tablet (200 mg total) by mouth at bedtime as needed (insomnia) for up to 90 days 30 tablet 2   QUEtiapine  (SEROQUEL ) 200 MG tablet Take 1 tablet (200 mg total) by mouth at bedtime as needed for up to 90 days. 30 tablet 2   QUEtiapine  (SEROQUEL ) 400 MG tablet Take 1 tablet (400 mg total) by mouth at bedtime. 30 tablet 3   QUEtiapine  (SEROQUEL ) 400 MG tablet Take 1 tablet (400 mg total) by mouth at bedtime. 30 tablet 2   QUEtiapine  (SEROQUEL ) 400 MG tablet Take 1 tablet (400 mg total) by mouth at bedtime. 30 tablet 2   QUEtiapine  (SEROQUEL ) 400 MG tablet Take 1 tablet (400 mg total) by mouth at bedtime. 30 tablet 2   QUEtiapine  (SEROQUEL ) 400 MG tablet Take 1 tablet (400 mg total) by mouth at bedtime. 30 tablet 2   QUEtiapine  (SEROQUEL ) 400 MG tablet Take 1 tablet (400 mg total) by mouth at bedtime. 30 tablet 2   QUEtiapine  (SEROQUEL ) 400 MG tablet Take 1 tablet (400 mg total) by mouth at bedtime for 90 days. 30 tablet 2   QUEtiapine  (SEROQUEL ) 400 MG tablet Take 1 tablet (400 mg total) by mouth at bedtime 30 tablet 2   rosuvastatin  (CRESTOR ) 20 MG tablet Take 1 tablet (20 mg total) by mouth daily. 90  tablet 1   rosuvastatin  (CRESTOR ) 20 MG tablet Take 1 tablet (20 mg total) by mouth daily. 90 tablet 1   tirzepatide  (MOUNJARO ) 10 MG/0.5ML Pen Inject 10 mg into the skin once a week. 2 mL 3   tirzepatide  (MOUNJARO ) 10 MG/0.5ML Pen Inject 10 mg into the skin once a week. 2 mL 3   tirzepatide  (MOUNJARO ) 12.5 MG/0.5ML Pen Inject 12.5 mg into the skin once a week. 2 mL 5   traMADol  (ULTRAM ) 50 MG tablet Take 50 mg by mouth 3 (three) times daily as needed for moderate pain. Takes 2 tablets in AM  traMADol  (ULTRAM ) 50 MG tablet Take 1 tablet (50 mg total) by mouth 2 (two) times daily as needed. 60 tablet 3   traMADol  (ULTRAM ) 50 MG tablet Take 1 tablet (50 mg total) by mouth 2 (two) times daily as needed for Pain for up to 30 days 60 tablet 3   traMADol  (ULTRAM ) 50 MG tablet Take 1 tablet (50 mg total) by mouth 2 (two) times daily as needed. 60 tablet 3   traZODone  (DESYREL ) 150 MG tablet Take 1 tablet (150 mg total) by mouth at bedtime. 30 tablet 11   traZODone  (DESYREL ) 150 MG tablet Take 1 tablet (150 mg total) by mouth at bedtime. 90 tablet 1   Dulaglutide  (TRULICITY ) 4.5 MG/0.5ML SOPN Inject 4.5mg  under skin once a week (Patient not taking: Reported on 01/17/2024) 8 mL 3   sertraline  (ZOLOFT ) 50 MG tablet Take 1 tablet (50 mg total) by mouth daily. (Patient not taking: Reported on 01/17/2024) 90 tablet 1   Suvorexant  (BELSOMRA ) 10 MG TABS Take 1 tablet (10 mg total) by mouth at bedtime. (Patient not taking: Reported on 01/17/2024) 30 tablet 1   traZODone  (DESYREL ) 100 MG tablet Take 1 tablet (100 mg total) by mouth at bedtime. (Patient not taking: Reported on 01/17/2024) 90 tablet 1   traZODone  (DESYREL ) 100 MG tablet Take 1 tablet (100 mg total) by mouth at bedtime. (Patient not taking: Reported on 01/17/2024) 90 tablet 1   blood glucose meter kit and supplies KIT Dispense based on patient and insurance preference. Use up to four times daily as directed. (FOR ICD-9 250.00, 250.01). 1 each 0    Dulaglutide  (TRULICITY ) 4.5 MG/0.5ML SOPN Inject 4.5 mg into the skin once a week as directed. 2 mL 1   sertraline  (ZOLOFT ) 100 MG tablet Take 1 tablet (100 mg total) by mouth daily. 90 tablet 1   No facility-administered medications prior to visit.

## 2024-01-18 ENCOUNTER — Other Ambulatory Visit: Payer: Self-pay

## 2024-01-18 MED ORDER — QUETIAPINE FUMARATE 200 MG PO TABS
200.0000 mg | ORAL_TABLET | Freq: Every evening | ORAL | 2 refills | Status: DC | PRN
  Filled 2024-01-18 – 2024-01-19 (×2): qty 30, 30d supply, fill #0
  Filled 2024-02-14 – 2024-02-16 (×3): qty 30, 30d supply, fill #1
  Filled 2024-03-14: qty 30, 30d supply, fill #2

## 2024-01-19 ENCOUNTER — Other Ambulatory Visit: Payer: Self-pay

## 2024-01-23 ENCOUNTER — Other Ambulatory Visit: Payer: Self-pay

## 2024-01-25 ENCOUNTER — Other Ambulatory Visit: Payer: Self-pay

## 2024-01-25 MED ORDER — CLONAZEPAM 1 MG PO TABS
1.0000 mg | ORAL_TABLET | Freq: Three times a day (TID) | ORAL | 0 refills | Status: DC
  Filled 2024-01-26 (×2): qty 90, 30d supply, fill #0

## 2024-01-26 ENCOUNTER — Other Ambulatory Visit: Payer: Self-pay

## 2024-01-26 ENCOUNTER — Other Ambulatory Visit (HOSPITAL_BASED_OUTPATIENT_CLINIC_OR_DEPARTMENT_OTHER): Payer: Self-pay

## 2024-02-01 ENCOUNTER — Other Ambulatory Visit: Payer: Self-pay

## 2024-02-01 MED ORDER — QUETIAPINE FUMARATE 400 MG PO TABS
400.0000 mg | ORAL_TABLET | Freq: Every day | ORAL | 3 refills | Status: DC
  Filled 2024-02-01 – 2024-04-30 (×2): qty 30, 30d supply, fill #0

## 2024-02-06 ENCOUNTER — Ambulatory Visit (INDEPENDENT_AMBULATORY_CARE_PROVIDER_SITE_OTHER): Payer: MEDICAID

## 2024-02-06 DIAGNOSIS — R0602 Shortness of breath: Secondary | ICD-10-CM

## 2024-02-06 LAB — PULMONARY FUNCTION TEST
DL/VA % pred: 80 %
DL/VA: 3.37 ml/min/mmHg/L
DLCO unc % pred: 61 %
DLCO unc: 15.76 ml/min/mmHg
FEF 25-75 Post: 2.75 L/s
FEF 25-75 Pre: 2.96 L/s
FEF2575-%Change-Post: -7 %
FEF2575-%Pred-Post: 102 %
FEF2575-%Pred-Pre: 110 %
FEV1-%Change-Post: 0 %
FEV1-%Pred-Post: 69 %
FEV1-%Pred-Pre: 69 %
FEV1-Post: 2.3 L
FEV1-Pre: 2.3 L
FEV1FVC-%Change-Post: 4 %
FEV1FVC-%Pred-Pre: 111 %
FEV6-%Change-Post: -4 %
FEV6-%Pred-Post: 62 %
FEV6-%Pred-Pre: 66 %
FEV6-Post: 2.62 L
FEV6-Pre: 2.75 L
FEV6FVC-%Pred-Post: 105 %
FEV6FVC-%Pred-Pre: 105 %
FVC-%Change-Post: -3 %
FVC-%Pred-Post: 60 %
FVC-%Pred-Pre: 62 %
FVC-Post: 2.64 L
FVC-Pre: 2.75 L
Post FEV1/FVC ratio: 87 %
Post FEV6/FVC ratio: 100 %
Pre FEV1/FVC ratio: 84 %
Pre FEV6/FVC Ratio: 100 %
RV % pred: 66 %
RV: 1.45 L
TLC % pred: 69 %
TLC: 4.6 L

## 2024-02-06 NOTE — Patient Instructions (Signed)
 Full PFT completed today ? ?

## 2024-02-06 NOTE — Progress Notes (Signed)
 Full PFT completed today ? ?

## 2024-02-08 ENCOUNTER — Other Ambulatory Visit: Payer: Self-pay

## 2024-02-08 ENCOUNTER — Ambulatory Visit (INDEPENDENT_AMBULATORY_CARE_PROVIDER_SITE_OTHER): Payer: MEDICAID | Admitting: Student in an Organized Health Care Education/Training Program

## 2024-02-08 ENCOUNTER — Encounter: Payer: Self-pay | Admitting: Student in an Organized Health Care Education/Training Program

## 2024-02-08 VITALS — BP 134/80 | HR 79 | Temp 97.8°F | Ht 68.0 in | Wt 220.0 lb

## 2024-02-08 DIAGNOSIS — J42 Unspecified chronic bronchitis: Secondary | ICD-10-CM | POA: Diagnosis not present

## 2024-02-08 MED ORDER — UMECLIDINIUM-VILANTEROL 62.5-25 MCG/ACT IN AEPB
1.0000 | INHALATION_SPRAY | Freq: Every day | RESPIRATORY_TRACT | 12 refills | Status: DC
  Filled 2024-02-08: qty 60, 30d supply, fill #0
  Filled 2024-03-11: qty 60, 30d supply, fill #1
  Filled ????-??-??: fill #2

## 2024-02-08 NOTE — Progress Notes (Signed)
 Assessment & Plan:   #COPD  #PRISM  #Shortness of Breath  Presents for follow up of shortness of breath and respiratory symptoms that are suggestive of COPD. His PFT's showed a ratio of 0.84, but with reduced FEV1 at 69% predicted, suggestive of preserved ratio/impaired spirometry given his smoking history. Lung volumes and DLCO are also moderately reduced. Suspect he would benefit from LAMA/LABA therapy. Patient reports being unable to perform the interlock device maneuver with a single breath (exhalation/inhalation) and this is possible given his obstruction - paperwork filled - noted he can perform a single exhalation breath.  - umeclidinium-vilanterol (ANORO ELLIPTA) 62.5-25 MCG/ACT AEPB; Inhale 1 puff into the lungs daily.  Dispense: 30 each; Refill: 12    I spent 30 minutes caring for this patient today, including preparing to see the patient, obtaining a medical history , reviewing a separately obtained history, performing a medically appropriate examination and/or evaluation, counseling and educating the patient/family/caregiver, ordering medications, tests, or procedures, documenting clinical information in the electronic health record, and independently interpreting results (not separately reported/billed) and communicating results to the patient/family/caregiver  [name], MD [facility]   End of visit medications:  Meds ordered this encounter  Medications   umeclidinium-vilanterol (ANORO ELLIPTA) 62.5-25 MCG/ACT AEPB    Sig: Inhale 1 puff into the lungs daily.    Dispense:  30 each    Refill:  12     Current Outpatient Medications:    aspirin  81 MG EC tablet, TAKE ONE TABLET BY MOUTH EVERY DAY, Disp: 90 tablet, Rfl: 0   clonazePAM  (KLONOPIN ) 1 MG tablet, Take 1 tablet (1 mg total) by mouth 3 (three) times daily., Disp: 90 tablet, Rfl: 0   clopidogrel  (PLAVIX ) 75 MG tablet, Take 1 tablet (75 mg total) by mouth daily., Disp: 90 tablet, Rfl: 0    doxepin  (SINEQUAN ) 25 MG capsule, Take 1 to 2 capsules (25 to 50 mg total) by mouth at bedtime., Disp: 60 capsule, Rfl: 2   hydrOXYzine  (VISTARIL ) 25 MG capsule, Take 1 capsule (25 mg total) by mouth 2 (two) times daily., Disp: 30 capsule, Rfl: 1   lidocaine  (LIDODERM ) 5 %, Place 1 patch onto the skin daily Apply patch to the most painful area for up to 12 hours in a 24 hour period. (Patient taking differently: Place 1 patch onto the skin as directed. PRN), Disp: 30 patch, Rfl: 3   losartan  (COZAAR ) 100 MG tablet, Take 1 tablet (100 mg total) by mouth daily., Disp: 90 tablet, Rfl: 0   metFORMIN  (GLUCOPHAGE ) 500 MG tablet, Take 1 tablet (500 mg total) by mouth daily with breakfast., Disp: 90 tablet, Rfl: 1   metoprolol  tartrate (LOPRESSOR ) 25 MG tablet, Take 1 tablet (25 mg total) by mouth 2 (two) times daily., Disp: 180 tablet, Rfl: 0   QUEtiapine  (SEROQUEL ) 200 MG tablet, Take 1 tablet (200 mg total) by mouth at bedtime as needed for insomnia., Disp: 30 tablet, Rfl: 2   QUEtiapine  (SEROQUEL ) 400 MG tablet, Take 1 tablet (400 mg total) by mouth at bedtime., Disp: 30 tablet, Rfl: 3   rosuvastatin  (CRESTOR ) 20 MG tablet, Take 1 tablet (20 mg total) by mouth daily., Disp: 90 tablet, Rfl: 1   tirzepatide  (MOUNJARO ) 12.5 MG/0.5ML Pen, Inject 12.5 mg into the skin once a week., Disp: 2 mL, Rfl: 5   traMADol  (ULTRAM ) 50 MG tablet, Take 1 tablet (50 mg total) by mouth 2 (two) times daily as needed., Disp: 60 tablet, Rfl: 3   traZODone  (  DESYREL ) 150 MG tablet, Take 1 tablet (150 mg total) by mouth at bedtime., Disp: 90 tablet, Rfl: 1   umeclidinium-vilanterol (ANORO ELLIPTA) 62.5-25 MCG/ACT AEPB, Inhale 1 puff into the lungs daily., Disp: 30 each, Rfl: 12   tirzepatide  (MOUNJARO ) 10 MG/0.5ML Pen, Inject 10 mg into the skin once a week., Disp: 2 mL, Rfl: 3   tirzepatide  (MOUNJARO ) 10 MG/0.5ML Pen, Inject 10 mg into the skin once a week., Disp: 2 mL, Rfl: 3   Subjective:   PATIENT ID: Larry French  GENDER: male DOB: 12/10/1960, MRN: [id number]  Chief Complaint  Patient presents with   Shortness of Breath    Shortness of breath on exertion.     HPI  Patient is a pleasant 63 year old male presenting to clinic for follow up of shortness of breath and evaluation for interlock device accomodation (requested by DMV).  Initial Visit 01/17/2024:  Patient reports symptoms of shortness of breath over the past few months.  He has felt increased dyspnea mostly with exertion over the past 6 months which he feels is associated with increased weight and central obesity.  He has an occasional cough and occasional wheezing but otherwise denies any chest pain or chest tightness.  He has not had the symptoms in the past.   Patient has a DWI which necessitates an interlock device.  He feels he was unable to generate the flows necessary for the interlock device. He needs to visit   Return Visit 02/08/2024:  Symptoms are stable, continues to have occasional cough and wheeze. No chest pain. Was able to perform PFT's and here to discuss results and have paperwork filled. No exacerbations or respiratory decompensations since last visit.   He reports a history of smoking, with around 40 to 50 pack years of smoking history. While he's had a DWI, he denies heavy drinking or daily alcohol use. He denies any history of alcohol withdrawal.  Ancillary information including prior medications, full medical/surgical/family/social histories, and PFTs (when available) are listed below and have been reviewed.    Review of Systems  Constitutional:  Negative for chills, fever and weight loss.  Respiratory:  Positive for cough, shortness of breath and wheezing. Negative for hemoptysis and sputum production.   Cardiovascular:  Negative for chest pain.     Objective:   Vitals:   02/08/24 1608  BP: 134/80  Pulse: 79  Temp: 97.8 F (36.6 C)  TempSrc: Temporal  SpO2: 97%  Weight: 220 lb (99.8 kg)  Height: 5' 8  (1.727 m)   97% on RA  BMI Readings from Last 3 Encounters:  02/08/24 33.45 kg/m  02/06/24 33.36 kg/m  01/17/24 34.30 kg/m   Wt Readings from Last 3 Encounters:  02/08/24 220 lb (99.8 kg)  02/06/24 219 lb 6.4 oz (99.5 kg)  01/17/24 225 lb 9.6 oz (102.3 kg)    Physical Exam Constitutional:      Appearance: Normal appearance. He is not ill-appearing.  HENT:     Head: Normocephalic and atraumatic.     Mouth/Throat:     Mouth: Mucous membranes are moist.  Cardiovascular:     Rate and Rhythm: Normal rate and regular rhythm.     Pulses: Normal pulses.     Heart sounds: Normal heart sounds.  Pulmonary:     Effort: Pulmonary effort is normal.     Breath sounds: Normal breath sounds.  Abdominal:     Palpations: Abdomen is soft.     Tenderness: There is no abdominal tenderness.  Musculoskeletal:  Right lower leg: No edema.     Left lower leg: No edema.  Neurological:     General: No focal deficit present.     Mental Status: He is alert and oriented to person, place, and time. Mental status is at baseline.       Ancillary Information    Past Medical History:  Diagnosis Date   Alcohol abuse    Recovered x 15 months   Anxiety    CA - skin cancer    CAD, residual CFX LAD disease 06/01/2011   a. s/p inferior ST elevation MI s/p PCI/DES to RCA on 05/29/2011; b. residual LAD and LCx CAD tx'd on 06/02/11 cath with LAD CAD successfully tx'd w/ PCI/DES, LCx managaged medically    Depression    Headache(784.0)    Hyperlipemia    Hypertension    Insomnia    Mental disorder    MI (myocardial infarction) (HCC)    Polysubstance abuse (HCC)    a. etoh, xanax , tobacco   PTSD (post-traumatic stress disorder)      Family History  Problem Relation Age of Onset   Chronic Renal Failure Mother    Cancer Mother        Non-Hodgkin lymphoma   Depression Mother    Anxiety disorder Mother    Depression Father    Anxiety disorder Father    Other Brother        MVA   Coronary  artery disease Maternal Grandfather 72       Died     Past Surgical History:  Procedure Laterality Date   CARDIAC CATHETERIZATION Bilateral 04/09/2015   Procedure: Coronary Angiogram;  Surgeon: Deatrice DELENA Cage, MD;  Location: [facility] INVASIVE CV LAB;  Service: Cardiovascular;  Laterality: Bilateral;   CARDIAC CATHETERIZATION [name] 04/09/2015   Procedure: Coronary Stent Intervention;  Surgeon: Deatrice DELENA Cage, MD;  Location: [facility] INVASIVE CV LAB;  Service: Cardiovascular;  Laterality: [name];   COLONOSCOPY WITH PROPOFOL  [name] 03/19/2019   Procedure: COLONOSCOPY WITH PROPOFOL ;  Surgeon: Unk Corinn Skiff, MD;  Location: [facility] ENDOSCOPY;  Service: Gastroenterology;  Laterality: [name];   LEFT HEART CATH Right 05/29/2011   Procedure: LEFT HEART CATH;  Surgeon: Alm LELON Clay, MD;  Location: [facility] CATH LAB;  Service: Cardiovascular;  Laterality: Right;   LEFT HEART CATHETERIZATION WITH CORONARY ANGIOGRAM [name] 06/01/2011   Procedure: LEFT HEART CATHETERIZATION WITH CORONARY ANGIOGRAM;  Surgeon: Alm LELON Clay, MD;  Location: [facility] CATH LAB;  Service: Cardiovascular;  Laterality: [name];  relook cath, possible PCI   TONSILLECTOMY AND ADENOIDECTOMY      Social History   Socioeconomic History   Marital status: Single    Spouse name: Not on file   Number of children: 0   Years of education: Not on file   Highest education level: Not on file  Occupational History   Occupation: Personal Shopper    Employer: [employer]  Tobacco Use   Smoking status: Every Day    Current packs/day: 1.50    Average packs/day: 1.5 packs/day for 30.0 years (45.0 ttl pk-yrs)    Types: Cigarettes   Smokeless tobacco: Never   Tobacco comments:    Smokes 0.75 PPD- khj 01/17/2024  Vaping Use   Vaping status: Never Used  Substance and Sexual Activity   Alcohol use: No    Comment: Previous user (sober x 5 years)   Drug use: No   Sexual activity: Not Currently  Other Topics Concern   Not on file  Social History Narrative  Not  on file   Social Drivers of Health   Financial Resource Strain: Low Risk  (01/11/2024)   Received from [facility]   Overall Financial Resource Strain (CARDIA)    Difficulty of Paying Living Expenses: Not hard at all  Food Insecurity: No Food Insecurity (01/11/2024)   Received from [facility]   Hunger Vital Sign    Within the past 12 months, you worried that your food would run out before you got the money to buy more.: Never true    Within the past 12 months, the food you bought just didn't last and you didn't have money to get more.: Never true  Recent Concern: Food Insecurity - Food Insecurity Present (11/02/2023)   Received from [facility]   Hunger Vital Sign    Within the past 12 months, you worried that your food would run out before you got the money to buy more.: Often true    Within the past 12 months, the food you bought just didn't last and you didn't have money to get more.: Sometimes true  Transportation Needs: No Transportation Needs (01/11/2024)   Received from [facility] - Transportation    In the past 12 months, has lack of transportation kept you from medical appointments or from getting medications?: No    Lack of Transportation (Non-Medical): No  Recent Concern: Transportation Needs - Unmet Transportation Needs (11/02/2023)   Received from [facility] - Transportation    In the past 12 months, has lack of transportation kept you from medical appointments or from getting medications?: No    Lack of Transportation (Non-Medical): Yes  Physical Activity: Sufficiently Active (02/11/2022)   Exercise Vital Sign    Days of Exercise per Week: 7 days    Minutes of Exercise per Session: 60 min  Stress: Stress Concern Present (02/11/2022)   [employer] of Occupational Health - Occupational Stress Questionnaire    Feeling of Stress : To some extent  Social  Connections: Moderately Isolated (02/11/2022)   Social Connection and Isolation Panel    Frequency of Communication with Friends and Family: More than three times a week    Frequency of Social Gatherings with Friends and Family: Once a week    Attends Religious Services: 1 to 4 times per year    Active Member of Clubs or Organizations: No    Attends Banker Meetings: Never    Marital Status: Never married  Intimate Partner Violence: Not At Risk (02/11/2022)   Humiliation, Afraid, Rape, and Kick questionnaire    Fear of Current or Ex-Partner: No    Emotionally Abused: No    Physically Abused: No    Sexually Abused: No     No Known Allergies   CBC    Component Value Date/Time   WBC 10.3 03/02/2018 1823   WBC 17.0 (H) 06/18/2015 2201   RBC 5.81 (H) 03/02/2018 1823   RBC 4.89 06/18/2015 2201   HGB 17.7 03/02/2018 1823   HCT 50.2 03/02/2018 1823   PLT 254 03/02/2018 1823   MCV 86 03/02/2018 1823   MCV 99 03/07/2014 0930   MCH 30.5 03/02/2018 1823   MCH 32.1 06/18/2015 2201   MCHC 35.3 03/02/2018 1823   MCHC 33.5 06/18/2015 2201   RDW 13.0 03/02/2018 1823   RDW 14.1 03/07/2014 0930   LYMPHSABS 1.5 04/19/2015 1438   LYMPHSABS 3.1 03/04/2014 1720   MONOABS  0.6 04/19/2015 1438   MONOABS 1.0 03/04/2014 1720   EOSABS 0.1 04/19/2015 1438   EOSABS 0.0 03/04/2014 1720   BASOSABS 0.0 04/19/2015 1438   BASOSABS 0.1 03/04/2014 1720    Pulmonary Functions Testing Results:    Latest Ref Rng & Units 02/06/2024    3:44 PM  PFT Results  FVC-Pre L 2.75  P  FVC-Predicted Pre % 62  P  FVC-Post L 2.64  P  FVC-Predicted Post % 60  P  Pre FEV1/FVC % % 84  P  Post FEV1/FCV % % 87  P  FEV1-Pre L 2.30  P  FEV1-Predicted Pre % 69  P  FEV1-Post L 2.30  P  DLCO uncorrected ml/min/mmHg 15.76  P  DLCO UNC% % 61  P  DLVA Predicted % 80  P  TLC L 4.60  P  TLC % Predicted % 69  P  RV % Predicted % 66  P    P Preliminary result    Outpatient Medications Prior to Visit   Medication Sig Dispense Refill   aspirin  81 MG EC tablet TAKE ONE TABLET BY MOUTH EVERY DAY 90 tablet 0   clonazePAM  (KLONOPIN ) 1 MG tablet Take 1 tablet (1 mg total) by mouth 3 (three) times daily. 90 tablet 0   clopidogrel  (PLAVIX ) 75 MG tablet Take 1 tablet (75 mg total) by mouth daily. 90 tablet 0   doxepin  (SINEQUAN ) 25 MG capsule Take 1 to 2 capsules (25 to 50 mg total) by mouth at bedtime. 60 capsule 2   hydrOXYzine  (VISTARIL ) 25 MG capsule Take 1 capsule (25 mg total) by mouth 2 (two) times daily. 30 capsule 1   lidocaine  (LIDODERM ) 5 % Place 1 patch onto the skin daily Apply patch to the most painful area for up to 12 hours in a 24 hour period. (Patient taking differently: Place 1 patch onto the skin as directed. PRN) 30 patch 3   losartan  (COZAAR ) 100 MG tablet Take 1 tablet (100 mg total) by mouth daily. 90 tablet 0   metFORMIN  (GLUCOPHAGE ) 500 MG tablet Take 1 tablet (500 mg total) by mouth daily with breakfast. 90 tablet 1   metoprolol  tartrate (LOPRESSOR ) 25 MG tablet Take 1 tablet (25 mg total) by mouth 2 (two) times daily. 180 tablet 0   QUEtiapine  (SEROQUEL ) 200 MG tablet Take 1 tablet (200 mg total) by mouth at bedtime as needed for insomnia. 30 tablet 2   QUEtiapine  (SEROQUEL ) 400 MG tablet Take 1 tablet (400 mg total) by mouth at bedtime. 30 tablet 3   rosuvastatin  (CRESTOR ) 20 MG tablet Take 1 tablet (20 mg total) by mouth daily. 90 tablet 1   tirzepatide  (MOUNJARO ) 12.5 MG/0.5ML Pen Inject 12.5 mg into the skin once a week. 2 mL 5   traMADol  (ULTRAM ) 50 MG tablet Take 1 tablet (50 mg total) by mouth 2 (two) times daily as needed. 60 tablet 3   traZODone  (DESYREL ) 150 MG tablet Take 1 tablet (150 mg total) by mouth at bedtime. 90 tablet 1   hydrOXYzine  (VISTARIL ) 25 MG capsule Take 1 capsule (25 mg total) by mouth 2 (two) times daily. 30 capsule 1   tirzepatide  (MOUNJARO ) 10 MG/0.5ML Pen Inject 10 mg into the skin once a week. 2 mL 3   tirzepatide  (MOUNJARO ) 10 MG/0.5ML Pen  Inject 10 mg into the skin once a week. 2 mL 3   clonazePAM  (KLONOPIN ) 1 MG tablet Take 1 mg by mouth 2 (two) times daily as needed for anxiety.  PRN in the afternoon     clonazePAM  (KLONOPIN ) 1 MG tablet Take 1 tablet (1 mg total) by mouth 3 (three) times daily as needed. 90 tablet 0   clopidogrel  (PLAVIX ) 75 MG tablet Take 1 tablet (75 mg total) by mouth daily. 30 tablet 5   clopidogrel  (PLAVIX ) 75 MG tablet Take 1 tablet (75 mg total) by mouth daily. 30 tablet 5   diphenhydrAMINE  (BENADRYL ) 50 MG capsule Take 50 mg by mouth as needed. Taking ~every 3 days as needed (Patient not taking: Reported on 02/08/2024)     Dulaglutide  (TRULICITY ) 4.5 MG/0.5ML SOPN Inject 4.5mg  under skin once a week (Patient not taking: Reported on 02/08/2024) 8 mL 3   gabapentin  (NEURONTIN ) 300 MG capsule Take 2 capsules (600 mg total) by mouth at bedtime. 60 capsule 2   gabapentin  (NEURONTIN ) 300 MG capsule Take 2 capsules (600 mg total) by mouth at bedtime. 60 capsule 2   gabapentin  (NEURONTIN ) 300 MG capsule Take 2 capsules (600 mg total) by mouth at bedtime. 60 capsule 2   gabapentin  (NEURONTIN ) 300 MG capsule Take 2 capsules (600 mg total) by mouth at bedtime. 60 capsule 2   gabapentin  (NEURONTIN ) 300 MG capsule Take 2 capsules (600 mg total) by mouth at bedtime. 60 capsule 2   gabapentin  (NEURONTIN ) 300 MG capsule Take 1 capsule (300 mg total) by mouth 2 (two) times daily in the morning and in the afternoon. 60 capsule 0   gabapentin  (NEURONTIN ) 300 MG capsule Take 2 capsules (600 mg total) by mouth 3 (three) times daily. 180 capsule 0   gabapentin  (NEURONTIN ) 300 MG capsule Take 2 capsules (600 mg total) by mouth 3 (three) times daily. 180 capsule 5   metoprolol  tartrate (LOPRESSOR ) 25 MG tablet Take 1 tablet (25 mg total) by mouth 2 (two) times daily. 120 tablet 1   metoprolol  tartrate (LOPRESSOR ) 25 MG tablet Take 1 tablet (25 mg total) by mouth 2 (two) times daily 120 tablet 1   metoprolol  tartrate (LOPRESSOR ) 25  MG tablet Take 1 tablet (25 mg total) by mouth 2 (two) times daily 120 tablet 1   QUEtiapine  (SEROQUEL ) 200 MG tablet Take 1 tablet (200 mg total) by mouth at bedtime as needed for insomnia. 30 tablet 2   QUEtiapine  (SEROQUEL ) 200 MG tablet Take 1 tablet (200 mg total) by mouth at bedtime as needed (insomnia) for up to 90 days 30 tablet 2   QUEtiapine  (SEROQUEL ) 200 MG tablet Take 1 tablet (200 mg total) by mouth at bedtime as needed for up to 90 days. 30 tablet 2   QUEtiapine  (SEROQUEL ) 400 MG tablet Take 1 tablet (400 mg total) by mouth at bedtime. 30 tablet 2   QUEtiapine  (SEROQUEL ) 400 MG tablet Take 1 tablet (400 mg total) by mouth at bedtime. 30 tablet 2   QUEtiapine  (SEROQUEL ) 400 MG tablet Take 1 tablet (400 mg total) by mouth at bedtime. 30 tablet 2   QUEtiapine  (SEROQUEL ) 400 MG tablet Take 1 tablet (400 mg total) by mouth at bedtime. 30 tablet 2   QUEtiapine  (SEROQUEL ) 400 MG tablet Take 1 tablet (400 mg total) by mouth at bedtime. 30 tablet 2   QUEtiapine  (SEROQUEL ) 400 MG tablet Take 1 tablet (400 mg total) by mouth at bedtime for 90 days. 30 tablet 2   QUEtiapine  (SEROQUEL ) 400 MG tablet Take 1 tablet (400 mg total) by mouth at bedtime 30 tablet 2   rosuvastatin  (CRESTOR ) 20 MG tablet Take 1 tablet (20 mg total) by mouth daily.  90 tablet 1   sertraline  (ZOLOFT ) 50 MG tablet Take 1 tablet (50 mg total) by mouth daily. (Patient not taking: Reported on 02/08/2024) 90 tablet 1   Suvorexant  (BELSOMRA ) 10 MG TABS Take 1 tablet (10 mg total) by mouth at bedtime. (Patient not taking: Reported on 02/08/2024) 30 tablet 1   traMADol  (ULTRAM ) 50 MG tablet Take 50 mg by mouth 3 (three) times daily as needed for moderate pain. Takes 2 tablets in AM     traMADol  (ULTRAM ) 50 MG tablet Take 1 tablet (50 mg total) by mouth 2 (two) times daily as needed. 60 tablet 3   traMADol  (ULTRAM ) 50 MG tablet Take 1 tablet (50 mg total) by mouth 2 (two) times daily as needed for Pain for up to 30 days 60 tablet 3    traZODone  (DESYREL ) 100 MG tablet Take 1 tablet (100 mg total) by mouth at bedtime. (Patient not taking: Reported on 02/08/2024) 90 tablet 1   traZODone  (DESYREL ) 100 MG tablet Take 1 tablet (100 mg total) by mouth at bedtime. (Patient not taking: Reported on 02/08/2024) 90 tablet 1   traZODone  (DESYREL ) 150 MG tablet Take 1 tablet (150 mg total) by mouth at bedtime. 30 tablet 11   No facility-administered medications prior to visit.

## 2024-02-09 ENCOUNTER — Other Ambulatory Visit: Payer: Self-pay

## 2024-02-13 ENCOUNTER — Ambulatory Visit (INDEPENDENT_AMBULATORY_CARE_PROVIDER_SITE_OTHER): Payer: MEDICAID

## 2024-02-13 DIAGNOSIS — R0602 Shortness of breath: Secondary | ICD-10-CM

## 2024-02-13 LAB — PULMONARY FUNCTION TEST
DL/VA % pred: 89 %
DL/VA: 3.74 ml/min/mmHg/L
DLCO unc % pred: 69 %
DLCO unc: 17.6 ml/min/mmHg
FEF 25-75 Post: 3.03 L/s
FEF 25-75 Pre: 2.58 L/s
FEF2575-%Change-Post: 17 %
FEF2575-%Pred-Post: 112 %
FEF2575-%Pred-Pre: 96 %
FEV1-%Change-Post: 1 %
FEV1-%Pred-Post: 71 %
FEV1-%Pred-Pre: 69 %
FEV1-Post: 2.34 L
FEV1-Pre: 2.3 L
FEV1FVC-%Change-Post: 2 %
FEV1FVC-%Pred-Pre: 113 %
FEV6-%Change-Post: 0 %
FEV6-%Pred-Post: 64 %
FEV6-%Pred-Pre: 64 %
FEV6-Post: 2.67 L
FEV6-Pre: 2.69 L
FEV6FVC-%Pred-Post: 105 %
FEV6FVC-%Pred-Pre: 105 %
FVC-%Change-Post: 0 %
FVC-%Pred-Post: 61 %
FVC-%Pred-Pre: 61 %
FVC-Post: 2.67 L
FVC-Pre: 2.69 L
Post FEV1/FVC ratio: 87 %
Post FEV6/FVC ratio: 100 %
Pre FEV1/FVC ratio: 85 %
Pre FEV6/FVC Ratio: 100 %
RV % pred: 83 %
RV: 1.81 L
TLC % pred: 77 %
TLC: 5.14 L

## 2024-02-13 NOTE — Progress Notes (Signed)
 Full PFT completed today ? ?

## 2024-02-13 NOTE — Patient Instructions (Signed)
 Full PFT completed today ? ?

## 2024-02-14 ENCOUNTER — Other Ambulatory Visit: Payer: Self-pay

## 2024-02-14 ENCOUNTER — Ambulatory Visit: Payer: MEDICAID | Admitting: Pulmonary Disease

## 2024-02-14 ENCOUNTER — Encounter: Payer: Self-pay | Admitting: Pulmonary Disease

## 2024-02-14 VITALS — BP 136/76 | HR 93 | Temp 97.9°F | Ht 68.0 in | Wt 218.2 lb

## 2024-02-14 DIAGNOSIS — J449 Chronic obstructive pulmonary disease, unspecified: Secondary | ICD-10-CM

## 2024-02-14 DIAGNOSIS — R0602 Shortness of breath: Secondary | ICD-10-CM

## 2024-02-14 MED ORDER — GABAPENTIN 300 MG PO CAPS
300.0000 mg | ORAL_CAPSULE | Freq: Three times a day (TID) | ORAL | 5 refills | Status: DC
  Filled 2024-02-14: qty 180, 60d supply, fill #0
  Filled 2024-04-05 – 2024-05-02 (×2): qty 180, 60d supply, fill #1

## 2024-02-14 NOTE — Patient Instructions (Signed)
 VISIT SUMMARY:  You came in today to discuss the activation of an interlock device, which is necessary for you to regain your driver's license. We also reviewed your current treatment for COPD and your overall health status.  YOUR PLAN:  -CHRONIC OBSTRUCTIVE PULMONARY DISEASE (COPD): COPD is a chronic lung condition that makes it hard to breathe. You have moderate COPD and are using the Anoro inhaler once daily, which has improved your shortness of breath. Continue using the Anoro inhaler once daily and avoid strenuous activities like tilling and moving furniture.  You still have difficulties however exhaling as were noted on the pulmonary function test.  This will limit your capacity to activate the Interlock and so this was reflected on your forms. INSTRUCTIONS:  Continue using the Anoro inhaler once daily and avoid strenuous activities. Follow up as needed for any changes in your symptoms or if you have any concerns.

## 2024-02-14 NOTE — Progress Notes (Signed)
 Subjective:    Patient ID: Larry French, male    DOB: February 07, 1961, 63 y.o.   MRN: [id number]  Patient Care Team: [name], MD as PCP - General (Internal Medicine) Darliss Rogue, MD as PCP - Cardiology (Cardiology) Isadora Hose, MD as Consulting Physician (Pulmonary Disease)  Chief Complaint  Patient presents with   Interlock device eval.    Patient follows for interlock device evaluation.    BACKGROUND: Patient is a 63 year old with moderate COPD who presents for evaluation of ability to trigger interlock device.     HPI Discussed the use of AI scribe software for clinical note transcription with the patient, who gave verbal consent to proceed.  History of Present Illness   Larry French is a 63 year old male with moderate COPD who presents for evaluation of ability to activate an interlock device.  He is here to discuss the activation of an interlock device, which is necessary for him to regain his driver's license. He has completed the required paperwork for this process.  He uses an inhaler, Anoro, once daily in the morning for his COPD. He experiences a 'powdery sense' when using it and notes an improvement in his shortness of breath. He attributes this improvement to the medication and being more cautious with physical activities, such as avoiding strenuous activities like tilling and moving furniture.  He mentions feeling 'really tired' today, describing it as a 'rough day', but generally feels great. No significant congestion today.   He does have difficulties exhaling as evidenced on his pulmonary function testing.     Review of Systems A 10 point review of systems was performed and it is as noted above otherwise negative.   Past Medical History:  Diagnosis Date   Alcohol abuse    Recovered x 15 months   Anxiety    CA - skin cancer    CAD, residual CFX LAD disease 06/01/2011   a. s/p inferior ST elevation MI s/p PCI/DES to RCA on  05/29/2011; b. residual LAD and LCx CAD tx'd on 06/02/11 cath with LAD CAD successfully tx'd w/ PCI/DES, LCx managaged medically    Depression    Headache(784.0)    Hyperlipemia    Hypertension    Insomnia    Mental disorder    MI (myocardial infarction) (HCC)    Polysubstance abuse (HCC)    a. etoh, xanax , tobacco   PTSD (post-traumatic stress disorder)     Past Surgical History:  Procedure Laterality Date   CARDIAC CATHETERIZATION Bilateral 04/09/2015   Procedure: Coronary Angiogram;  Surgeon: Deatrice DELENA Cage, MD;  Location: [facility] INVASIVE CV LAB;  Service: Cardiovascular;  Laterality: Bilateral;   CARDIAC CATHETERIZATION [name] 04/09/2015   Procedure: Coronary Stent Intervention;  Surgeon: Deatrice DELENA Cage, MD;  Location: [facility] INVASIVE CV LAB;  Service: Cardiovascular;  Laterality: [name];   COLONOSCOPY WITH PROPOFOL  [name] 03/19/2019   Procedure: COLONOSCOPY WITH PROPOFOL ;  Surgeon: Unk Corinn Skiff, MD;  Location: [facility] ENDOSCOPY;  Service: Gastroenterology;  Laterality: [name];   LEFT HEART CATH Right 05/29/2011   Procedure: LEFT HEART CATH;  Surgeon: Alm LELON Clay, MD;  Location: [facility] CATH LAB;  Service: Cardiovascular;  Laterality: Right;   LEFT HEART CATHETERIZATION WITH CORONARY ANGIOGRAM [name] 06/01/2011   Procedure: LEFT HEART CATHETERIZATION WITH CORONARY ANGIOGRAM;  Surgeon: Alm LELON Clay, MD;  Location: [facility] CATH LAB;  Service: Cardiovascular;  Laterality: [name];  relook cath, possible PCI   TONSILLECTOMY AND ADENOIDECTOMY      Patient Active Problem List  Diagnosis Date Noted   Cerumen impaction 04/29/2021   Controlled type 2 diabetes mellitus without complication, without long-term current use of insulin (HCC) 01/28/2021   Fall 01/10/2020   Insomnia 12/05/2019   Anxiety 08/14/2019   Numbness and tingling 08/14/2019   Fleshy skin mole 02/28/2019   Elevated liver enzymes 10/17/2018   Hearing loss 10/17/2018   Erectile dysfunction 10/17/2018   Type 2 diabetes mellitus with  hyperglycemia, without long-term current use of insulin (HCC) 03/22/2017   Hypertension 11/04/2016   Healthcare maintenance 11/04/2016   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   Benzodiazepine dependence (HCC) 04/25/2015   Alcohol abuse with alcohol-induced mood disorder (HCC) 04/22/2015   GAD (generalized anxiety disorder)    Severe episode of recurrent major depressive disorder, without psychotic features (HCC)    Alcohol abuse 04/17/2015   Benzodiazepine abuse (HCC) 04/17/2015   Posttraumatic stress disorder 04/17/2015   Suicidal ideation 04/17/2015   Abnormal EKG    Benzodiazepine withdrawal (HCC)    NSTEMI (non-ST elevated myocardial infarction) (HCC)    Positive cardiac stress test    Elevated troponin    Withdrawal from benzodiazepine (HCC) 04/05/2015   STEMI, RCA DES 05/28/10 06/01/2011   CAD, residual CFX LAD disease 06/01/2011   Tobacco abuse 05/29/2011   History of ETOH abuse and Xanax  abuse. In rehab pta 05/29/2011    Family History  Problem Relation Age of Onset   Chronic Renal Failure Mother    Cancer Mother        Non-Hodgkin lymphoma   Depression Mother    Anxiety disorder Mother    Depression Father    Anxiety disorder Father    Other Brother        MVA   Coronary artery disease Maternal Grandfather 34       Died    Social History   Tobacco Use   Smoking status: Every Day    Current packs/day: 1.50    Average packs/day: 1.5 packs/day for 30.0 years (45.0 ttl pk-yrs)    Types: Cigarettes   Smokeless tobacco: Never   Tobacco comments:    Smokes 0.75 PPD- khj 01/17/2024  Substance Use Topics   Alcohol use: No    Comment: Previous user (sober x 5 years)    No Known Allergies  Current Meds  Medication Sig   aspirin  81 MG EC tablet TAKE ONE TABLET BY MOUTH EVERY DAY   clonazePAM  (KLONOPIN ) 1 MG tablet Take 1 tablet (1 mg total) by mouth 3 (three) times daily.   clopidogrel  (PLAVIX ) 75 MG tablet Take 1 tablet (75 mg  total) by mouth daily.   doxepin  (SINEQUAN ) 25 MG capsule Take 1 to 2 capsules (25 to 50 mg total) by mouth at bedtime.   gabapentin  (NEURONTIN ) 300 MG capsule Take 1 capsule (300 mg total) by mouth 3 (three) times daily.   hydrOXYzine  (VISTARIL ) 25 MG capsule Take 1 capsule (25 mg total) by mouth 2 (two) times daily.   lidocaine  (LIDODERM ) 5 % Place 1 patch onto the skin daily Apply patch to the most painful area for up to 12 hours in a 24 hour period. (Patient taking differently: Place 1 patch onto the skin as directed. PRN)   losartan  (COZAAR ) 100 MG tablet Take 1 tablet (100 mg total) by mouth daily.   metFORMIN  (GLUCOPHAGE ) 500 MG tablet Take 1 tablet (500 mg total) by mouth daily with breakfast.   metoprolol  tartrate (LOPRESSOR ) 25 MG tablet Take 1 tablet (25 mg total) by mouth  2 (two) times daily.   QUEtiapine  (SEROQUEL ) 200 MG tablet Take 1 tablet (200 mg total) by mouth at bedtime as needed for insomnia.   QUEtiapine  (SEROQUEL ) 400 MG tablet Take 1 tablet (400 mg total) by mouth at bedtime.   rosuvastatin  (CRESTOR ) 20 MG tablet Take 1 tablet (20 mg total) by mouth daily.   tirzepatide  (MOUNJARO ) 10 MG/0.5ML Pen Inject 10 mg into the skin once a week.   tirzepatide  (MOUNJARO ) 10 MG/0.5ML Pen Inject 10 mg into the skin once a week.   tirzepatide  (MOUNJARO ) 12.5 MG/0.5ML Pen Inject 12.5 mg into the skin once a week.   traMADol  (ULTRAM ) 50 MG tablet Take 1 tablet (50 mg total) by mouth 2 (two) times daily as needed.   traZODone  (DESYREL ) 150 MG tablet Take 1 tablet (150 mg total) by mouth at bedtime.   umeclidinium-vilanterol (ANORO ELLIPTA) 62.5-25 MCG/ACT AEPB Inhale 1 puff into the lungs daily.    Immunization History  Administered Date(s) Administered   Influenza,inj,Quad PF,6+ Mos 02/22/2020, 02/11/2022   Influenza-Unspecified 01/03/2013   Moderna Sars-Covid-2 Vaccination 07/19/2019   Pneumococcal Conjugate-13 11/28/2020   Pneumococcal Polysaccharide-23 02/28/2019         Objective:     BP 136/76   Pulse 93   Temp 97.9 F (36.6 C) (Temporal)   Ht 5' 8 (1.727 m)   Wt 218 lb 3.2 oz (99 kg)   SpO2 98%   BMI 33.18 kg/m   SpO2: 98 %  GENERAL: Obese gentleman, no acute distress, no conversational dyspnea, fully ambulatory. HEAD: Normocephalic, atraumatic.  EYES: Pupils equal, round, reactive to light.  No scleral icterus.  MOUTH: Partial upper and lower dentures.  Oral mucosa moist.  No thrush. NECK: Supple. No thyromegaly. Trachea midline. No JVD.  No adenopathy. PULMONARY: Good air entry bilaterally.  Coarse, otherwise, no adventitious sounds. CARDIOVASCULAR: S1 and S2. Regular rate and rhythm.  No rubs, murmurs or gallops heard. ABDOMEN: Obese, otherwise benign. MUSCULOSKELETAL: No joint deformity, no clubbing, no edema.  NEUROLOGIC: No overt focal deficit. SKIN: Intact,warm,dry. PSYCH: Easily distractible.  Cooperative.  Recent Results (from the past 2160 hours)  Pulmonary Function Test     Status: None   Collection Time: 02/06/24  3:44 PM  Result Value Ref Range   FVC-Pre 2.75 L   FVC-%Pred-Pre 62 %   FVC-Post 2.64 L   FVC-%Pred-Post 60 %   FVC-%Change-Post -3 %   FEV1-Pre 2.30 L   FEV1-%Pred-Pre 69 %   FEV1-Post 2.30 L   FEV1-%Pred-Post 69 %   FEV1-%Change-Post 0 %   FEV6-Pre 2.75 L   FEV6-%Pred-Pre 66 %   FEV6-Post 2.62 L   FEV6-%Pred-Post 62 %   FEV6-%Change-Post -4 %   Pre FEV1/FVC ratio 84 %   FEV1FVC-%Pred-Pre 111 %   Post FEV1/FVC ratio 87 %   FEV1FVC-%Change-Post 4 %   Pre FEV6/FVC Ratio 100 %   FEV6FVC-%Pred-Pre 105 %   Post FEV6/FVC ratio 100 %   FEV6FVC-%Pred-Post 105 %   FEF 25-75 Pre 2.96 L/sec   FEF2575-%Pred-Pre 110 %   FEF 25-75 Post 2.75 L/sec   FEF2575-%Pred-Post 102 %   FEF2575-%Change-Post -7 %   RV 1.45 L   RV % pred 66 %   TLC 4.60 L   TLC % pred 69 %   DLCO unc 15.76 ml/min/mmHg   DLCO unc % pred 61 %   DL/VA 6.62 ml/min/mmHg/L   DL/VA % pred 80 %  Pulmonary function test     Status: None  Collection Time: 02/13/24  3:51 PM  Result Value Ref Range   FVC-Pre 2.69 L   FVC-%Pred-Pre 61 %   FVC-Post 2.67 L   FVC-%Pred-Post 61 %   FVC-%Change-Post 0 %   FEV1-Pre 2.30 L   FEV1-%Pred-Pre 69 %   FEV1-Post 2.34 L   FEV1-%Pred-Post 71 %   FEV1-%Change-Post 1 %   FEV6-Pre 2.69 L   FEV6-%Pred-Pre 64 %   FEV6-Post 2.67 L   FEV6-%Pred-Post 64 %   FEV6-%Change-Post 0 %   Pre FEV1/FVC ratio 85 %   FEV1FVC-%Pred-Pre 113 %   Post FEV1/FVC ratio 87 %   FEV1FVC-%Change-Post 2 %   Pre FEV6/FVC Ratio 100 %   FEV6FVC-%Pred-Pre 105 %   Post FEV6/FVC ratio 100 %   FEV6FVC-%Pred-Post 105 %   FEF 25-75 Pre 2.58 L/sec   FEF2575-%Pred-Pre 96 %   FEF 25-75 Post 3.03 L/sec   FEF2575-%Pred-Post 112 %   FEF2575-%Change-Post 17 %   RV 1.81 L   RV % pred 83 %   TLC 5.14 L   TLC % pred 77 %   DLCO unc 17.60 ml/min/mmHg   DLCO unc % pred 69 %   DL/VA 6.25 ml/min/mmHg/L   DL/VA % pred 89 %  *PFTs reviewed.    Assessment & Plan:     ICD-10-CM   1. Stage 2 moderate COPD by GOLD classification (HCC)  J44.9     2. Shortness of breath  R06.02      Discussion:    Chronic obstructive pulmonary disease (COPD) Moderate COPD with improvement in dyspnea. Using Anoro inhaler once daily with noted improvement. No significant congestion reported today.  Pulmonary function testing shows no significant change from 06 February 2024. - Continue Anoro inhaler once daily - Advise to avoid strenuous activities such as tilling and moving furniture     Inability to trigger Interlock device Patient limited due to COPD/body habitus with regards to ability to trigger interlock device on a single breath (inhalation/exhalation) he cannot perform the maneuver in a single exhalation breath. - Paperwork for interlock device filled out.    Advised if symptoms do not improve or worsen, to please contact office for sooner follow up or seek emergency care.    I spent 23 minutes of dedicated to the care of this  patient on the date of this encounter to include pre-visit review of records, face-to-face time with the patient discussing conditions above, post visit ordering of testing, clinical documentation with the electronic health record, making appropriate referrals as documented, and communicating necessary findings to members of the patients care team.   C. Leita Sanders, MD Advanced Bronchoscopy [facility]    *This note was dictated using voice recognition software/Dragon.  Despite best efforts to proofread, errors can occur which can change the meaning. Any transcriptional errors that result from this process are unintentional and may not be fully corrected at the time of dictation.

## 2024-02-15 ENCOUNTER — Other Ambulatory Visit: Payer: Self-pay

## 2024-02-16 ENCOUNTER — Other Ambulatory Visit: Payer: Self-pay

## 2024-02-22 ENCOUNTER — Other Ambulatory Visit: Payer: Self-pay

## 2024-02-22 MED ORDER — HYDROXYZINE PAMOATE 25 MG PO CAPS
25.0000 mg | ORAL_CAPSULE | Freq: Two times a day (BID) | ORAL | 1 refills | Status: DC
  Filled 2024-02-22: qty 30, 15d supply, fill #0
  Filled 2024-03-11: qty 30, 15d supply, fill #1

## 2024-02-22 MED ORDER — CLONAZEPAM 1 MG PO TABS
1.0000 mg | ORAL_TABLET | Freq: Three times a day (TID) | ORAL | 1 refills | Status: DC
  Filled 2024-02-22: qty 90, 30d supply, fill #0
  Filled 2024-03-26: qty 90, 30d supply, fill #1

## 2024-02-23 ENCOUNTER — Other Ambulatory Visit: Payer: Self-pay

## 2024-02-23 MED ORDER — PREDNISONE 5 MG PO TABS
ORAL_TABLET | ORAL | 0 refills | Status: AC
  Filled 2024-02-23: qty 21, 6d supply, fill #0

## 2024-03-01 ENCOUNTER — Other Ambulatory Visit: Payer: Self-pay

## 2024-03-01 MED ORDER — QUETIAPINE FUMARATE 400 MG PO TABS
400.0000 mg | ORAL_TABLET | Freq: Every day | ORAL | 2 refills | Status: DC
  Filled 2024-03-01 – 2024-03-02 (×2): qty 30, 30d supply, fill #0
  Filled 2024-03-29: qty 30, 30d supply, fill #1

## 2024-03-02 ENCOUNTER — Other Ambulatory Visit: Payer: Self-pay

## 2024-03-11 ENCOUNTER — Other Ambulatory Visit: Payer: Self-pay

## 2024-03-12 ENCOUNTER — Other Ambulatory Visit: Payer: Self-pay

## 2024-03-12 MED ORDER — LOSARTAN POTASSIUM 100 MG PO TABS
100.0000 mg | ORAL_TABLET | Freq: Every day | ORAL | 0 refills | Status: DC
  Filled 2024-03-12: qty 90, 90d supply, fill #0

## 2024-03-12 MED ORDER — HYDROXYZINE PAMOATE 25 MG PO CAPS
25.0000 mg | ORAL_CAPSULE | Freq: Two times a day (BID) | ORAL | 1 refills | Status: DC
  Filled 2024-03-12 – 2024-03-29 (×2): qty 30, 15d supply, fill #0
  Filled 2024-05-02: qty 30, 15d supply, fill #1

## 2024-03-12 MED ORDER — TRAZODONE HCL 150 MG PO TABS
150.0000 mg | ORAL_TABLET | Freq: Every evening | ORAL | 1 refills | Status: DC
  Filled 2024-03-12 – 2024-03-19 (×4): qty 90, 90d supply, fill #0
  Filled ????-??-??: fill #0

## 2024-03-13 ENCOUNTER — Other Ambulatory Visit: Payer: Self-pay

## 2024-03-14 ENCOUNTER — Other Ambulatory Visit: Payer: Self-pay

## 2024-03-16 ENCOUNTER — Other Ambulatory Visit: Payer: Self-pay

## 2024-03-19 ENCOUNTER — Other Ambulatory Visit: Payer: Self-pay

## 2024-03-26 ENCOUNTER — Other Ambulatory Visit: Payer: Self-pay

## 2024-03-26 MED ORDER — DOXEPIN HCL 25 MG PO CAPS
50.0000 mg | ORAL_CAPSULE | Freq: Every day | ORAL | 2 refills | Status: DC
  Filled 2024-03-26: qty 60, 30d supply, fill #0

## 2024-03-30 ENCOUNTER — Other Ambulatory Visit: Payer: Self-pay

## 2024-04-04 ENCOUNTER — Other Ambulatory Visit: Payer: Self-pay

## 2024-04-05 ENCOUNTER — Other Ambulatory Visit: Payer: Self-pay

## 2024-04-05 MED ORDER — GABAPENTIN 300 MG PO CAPS
600.0000 mg | ORAL_CAPSULE | Freq: Three times a day (TID) | ORAL | 5 refills | Status: DC
  Filled 2024-04-05: qty 180, 30d supply, fill #0

## 2024-04-23 ENCOUNTER — Other Ambulatory Visit: Payer: Self-pay

## 2024-04-23 MED ORDER — HYDROXYZINE PAMOATE 25 MG PO CAPS
25.0000 mg | ORAL_CAPSULE | Freq: Two times a day (BID) | ORAL | 1 refills | Status: DC
  Filled 2024-04-23: qty 30, 15d supply, fill #0

## 2024-04-23 MED ORDER — CLONAZEPAM 1 MG PO TABS
1.0000 mg | ORAL_TABLET | Freq: Three times a day (TID) | ORAL | 1 refills | Status: DC
  Filled 2024-04-23: qty 90, 30d supply, fill #0

## 2024-04-30 ENCOUNTER — Other Ambulatory Visit: Payer: Self-pay

## 2024-05-02 ENCOUNTER — Other Ambulatory Visit: Payer: Self-pay

## 2024-05-03 ENCOUNTER — Other Ambulatory Visit: Payer: Self-pay

## 2024-05-04 ENCOUNTER — Other Ambulatory Visit: Payer: Self-pay

## 2024-05-04 ENCOUNTER — Inpatient Hospital Stay
Admission: EM | Admit: 2024-05-04 | Discharge: 2024-05-15 | DRG: 870 | Disposition: A | Discharge status: Hospice | Payer: MEDICAID | Attending: Family Medicine | Admitting: Family Medicine

## 2024-05-04 ENCOUNTER — Emergency Department: Payer: MEDICAID

## 2024-05-04 ENCOUNTER — Inpatient Hospital Stay: Payer: MEDICAID

## 2024-05-04 ENCOUNTER — Encounter: Payer: Self-pay | Admitting: Pulmonary Disease

## 2024-05-04 DIAGNOSIS — Z5982 Transportation insecurity: Secondary | ICD-10-CM

## 2024-05-04 DIAGNOSIS — Z8249 Family history of ischemic heart disease and other diseases of the circulatory system: Secondary | ICD-10-CM

## 2024-05-04 DIAGNOSIS — F1310 Sedative, hypnotic or anxiolytic abuse, uncomplicated: Secondary | ICD-10-CM | POA: Diagnosis present

## 2024-05-04 DIAGNOSIS — N281 Cyst of kidney, acquired: Secondary | ICD-10-CM | POA: Diagnosis present

## 2024-05-04 DIAGNOSIS — N170 Acute kidney failure with tubular necrosis: Secondary | ICD-10-CM | POA: Diagnosis present

## 2024-05-04 DIAGNOSIS — F32A Depression, unspecified: Secondary | ICD-10-CM | POA: Diagnosis present

## 2024-05-04 DIAGNOSIS — I493 Ventricular premature depolarization: Secondary | ICD-10-CM | POA: Diagnosis present

## 2024-05-04 DIAGNOSIS — I10 Essential (primary) hypertension: Secondary | ICD-10-CM | POA: Diagnosis present

## 2024-05-04 DIAGNOSIS — M5030 Other cervical disc degeneration, unspecified cervical region: Secondary | ICD-10-CM | POA: Diagnosis present

## 2024-05-04 DIAGNOSIS — R791 Abnormal coagulation profile: Secondary | ICD-10-CM | POA: Diagnosis present

## 2024-05-04 DIAGNOSIS — E876 Hypokalemia: Secondary | ICD-10-CM | POA: Diagnosis present

## 2024-05-04 DIAGNOSIS — L89012 Pressure ulcer of right elbow, stage 2: Secondary | ICD-10-CM | POA: Diagnosis present

## 2024-05-04 DIAGNOSIS — M4306 Spondylolysis, lumbar region: Secondary | ICD-10-CM | POA: Diagnosis present

## 2024-05-04 DIAGNOSIS — E8721 Acute metabolic acidosis: Secondary | ICD-10-CM | POA: Diagnosis present

## 2024-05-04 DIAGNOSIS — Z7984 Long term (current) use of oral hypoglycemic drugs: Secondary | ICD-10-CM

## 2024-05-04 DIAGNOSIS — F1010 Alcohol abuse, uncomplicated: Secondary | ICD-10-CM | POA: Diagnosis present

## 2024-05-04 DIAGNOSIS — F419 Anxiety disorder, unspecified: Secondary | ICD-10-CM | POA: Diagnosis present

## 2024-05-04 DIAGNOSIS — I4891 Unspecified atrial fibrillation: Secondary | ICD-10-CM | POA: Diagnosis present

## 2024-05-04 DIAGNOSIS — J189 Pneumonia, unspecified organism: Secondary | ICD-10-CM | POA: Diagnosis present

## 2024-05-04 DIAGNOSIS — I472 Ventricular tachycardia, unspecified: Secondary | ICD-10-CM | POA: Diagnosis present

## 2024-05-04 DIAGNOSIS — E872 Acidosis, unspecified: Secondary | ICD-10-CM

## 2024-05-04 DIAGNOSIS — E875 Hyperkalemia: Secondary | ICD-10-CM | POA: Diagnosis present

## 2024-05-04 DIAGNOSIS — X31XXXA Exposure to excessive natural cold, initial encounter: Secondary | ICD-10-CM

## 2024-05-04 DIAGNOSIS — E1165 Type 2 diabetes mellitus with hyperglycemia: Secondary | ICD-10-CM | POA: Diagnosis present

## 2024-05-04 DIAGNOSIS — G4700 Insomnia, unspecified: Secondary | ICD-10-CM | POA: Diagnosis present

## 2024-05-04 DIAGNOSIS — K7200 Acute and subacute hepatic failure without coma: Secondary | ICD-10-CM | POA: Diagnosis present

## 2024-05-04 DIAGNOSIS — F4310 Post-traumatic stress disorder, unspecified: Secondary | ICD-10-CM | POA: Diagnosis present

## 2024-05-04 DIAGNOSIS — R579 Shock, unspecified: Secondary | ICD-10-CM

## 2024-05-04 DIAGNOSIS — I2489 Other forms of acute ischemic heart disease: Secondary | ICD-10-CM | POA: Diagnosis present

## 2024-05-04 DIAGNOSIS — I2510 Atherosclerotic heart disease of native coronary artery without angina pectoris: Secondary | ICD-10-CM | POA: Diagnosis present

## 2024-05-04 DIAGNOSIS — J9811 Atelectasis: Secondary | ICD-10-CM | POA: Diagnosis present

## 2024-05-04 DIAGNOSIS — Z9861 Coronary angioplasty status: Secondary | ICD-10-CM

## 2024-05-04 DIAGNOSIS — G928 Other toxic encephalopathy: Secondary | ICD-10-CM | POA: Diagnosis present

## 2024-05-04 DIAGNOSIS — Z515 Encounter for palliative care: Secondary | ICD-10-CM

## 2024-05-04 DIAGNOSIS — E66811 Obesity, class 1: Secondary | ICD-10-CM | POA: Diagnosis present

## 2024-05-04 DIAGNOSIS — M6282 Rhabdomyolysis: Secondary | ICD-10-CM | POA: Diagnosis present

## 2024-05-04 DIAGNOSIS — R140 Abdominal distension (gaseous): Secondary | ICD-10-CM | POA: Diagnosis present

## 2024-05-04 DIAGNOSIS — Z8419 Family history of other disorders of kidney and ureter: Secondary | ICD-10-CM

## 2024-05-04 DIAGNOSIS — Z85828 Personal history of other malignant neoplasm of skin: Secondary | ICD-10-CM

## 2024-05-04 DIAGNOSIS — T68XXXA Hypothermia, initial encounter: Secondary | ICD-10-CM | POA: Diagnosis present

## 2024-05-04 DIAGNOSIS — D696 Thrombocytopenia, unspecified: Secondary | ICD-10-CM | POA: Diagnosis present

## 2024-05-04 DIAGNOSIS — Z7982 Long term (current) use of aspirin: Secondary | ICD-10-CM

## 2024-05-04 DIAGNOSIS — R4182 Altered mental status, unspecified: Principal | ICD-10-CM

## 2024-05-04 DIAGNOSIS — L89022 Pressure ulcer of left elbow, stage 2: Secondary | ICD-10-CM | POA: Diagnosis present

## 2024-05-04 DIAGNOSIS — Z5941 Food insecurity: Secondary | ICD-10-CM

## 2024-05-04 DIAGNOSIS — F1410 Cocaine abuse, uncomplicated: Secondary | ICD-10-CM | POA: Diagnosis present

## 2024-05-04 DIAGNOSIS — E870 Hyperosmolality and hypernatremia: Secondary | ICD-10-CM | POA: Diagnosis present

## 2024-05-04 DIAGNOSIS — F17210 Nicotine dependence, cigarettes, uncomplicated: Secondary | ICD-10-CM | POA: Diagnosis present

## 2024-05-04 DIAGNOSIS — Z7902 Long term (current) use of antithrombotics/antiplatelets: Secondary | ICD-10-CM

## 2024-05-04 DIAGNOSIS — J690 Pneumonitis due to inhalation of food and vomit: Secondary | ICD-10-CM | POA: Diagnosis present

## 2024-05-04 DIAGNOSIS — Z79899 Other long term (current) drug therapy: Secondary | ICD-10-CM

## 2024-05-04 DIAGNOSIS — J9601 Acute respiratory failure with hypoxia: Secondary | ICD-10-CM | POA: Diagnosis present

## 2024-05-04 DIAGNOSIS — R6521 Severe sepsis with septic shock: Secondary | ICD-10-CM | POA: Diagnosis present

## 2024-05-04 DIAGNOSIS — T528X1A Toxic effect of other organic solvents, accidental (unintentional), initial encounter: Secondary | ICD-10-CM | POA: Diagnosis present

## 2024-05-04 DIAGNOSIS — Z6833 Body mass index (BMI) 33.0-33.9, adult: Secondary | ICD-10-CM

## 2024-05-04 DIAGNOSIS — Z23 Encounter for immunization: Secondary | ICD-10-CM

## 2024-05-04 DIAGNOSIS — Z66 Do not resuscitate: Secondary | ICD-10-CM | POA: Diagnosis present

## 2024-05-04 DIAGNOSIS — E785 Hyperlipidemia, unspecified: Secondary | ICD-10-CM | POA: Diagnosis present

## 2024-05-04 DIAGNOSIS — Z818 Family history of other mental and behavioral disorders: Secondary | ICD-10-CM

## 2024-05-04 DIAGNOSIS — Z7985 Long-term (current) use of injectable non-insulin antidiabetic drugs: Secondary | ICD-10-CM

## 2024-05-04 DIAGNOSIS — L8990 Pressure ulcer of unspecified site, unspecified stage: Secondary | ICD-10-CM | POA: Insufficient documentation

## 2024-05-04 DIAGNOSIS — Z9911 Dependence on respirator [ventilator] status: Secondary | ICD-10-CM

## 2024-05-04 DIAGNOSIS — K760 Fatty (change of) liver, not elsewhere classified: Secondary | ICD-10-CM | POA: Diagnosis present

## 2024-05-04 DIAGNOSIS — I252 Old myocardial infarction: Secondary | ICD-10-CM

## 2024-05-04 DIAGNOSIS — A419 Sepsis, unspecified organism: Principal | ICD-10-CM | POA: Diagnosis present

## 2024-05-04 DIAGNOSIS — Z7189 Other specified counseling: Secondary | ICD-10-CM

## 2024-05-04 DIAGNOSIS — J9600 Acute respiratory failure, unspecified whether with hypoxia or hypercapnia: Secondary | ICD-10-CM | POA: Diagnosis present

## 2024-05-04 DIAGNOSIS — J440 Chronic obstructive pulmonary disease with acute lower respiratory infection: Secondary | ICD-10-CM | POA: Diagnosis present

## 2024-05-04 LAB — CBC WITH DIFFERENTIAL/PLATELET
Abs Immature Granulocytes: 0.19 K/uL — ABNORMAL HIGH (ref 0.00–0.07)
Basophils Absolute: 0 K/uL (ref 0.0–0.1)
Basophils Relative: 0 %
Eosinophils Absolute: 0.1 K/uL (ref 0.0–0.5)
Eosinophils Relative: 1 %
HCT: 40.8 % (ref 39.0–52.0)
Hemoglobin: 12.7 g/dL — ABNORMAL LOW (ref 13.0–17.0)
Immature Granulocytes: 2 %
Lymphocytes Relative: 16 %
Lymphs Abs: 1.5 K/uL (ref 0.7–4.0)
MCH: 31.8 pg (ref 26.0–34.0)
MCHC: 31.1 g/dL (ref 30.0–36.0)
MCV: 102.3 fL — ABNORMAL HIGH (ref 80.0–100.0)
Monocytes Absolute: 0.3 K/uL (ref 0.1–1.0)
Monocytes Relative: 3 %
Neutro Abs: 7.3 K/uL (ref 1.7–7.7)
Neutrophils Relative %: 78 %
Platelets: 103 K/uL — ABNORMAL LOW (ref 150–400)
RBC: 3.99 MIL/uL — ABNORMAL LOW (ref 4.22–5.81)
RDW: 16.1 % — ABNORMAL HIGH (ref 11.5–15.5)
WBC: 9.3 K/uL (ref 4.0–10.5)
nRBC: 0 % (ref 0.0–0.2)

## 2024-05-04 LAB — BASIC METABOLIC PANEL WITH GFR
Anion gap: 26 — ABNORMAL HIGH (ref 5–15)
Anion gap: 12 (ref 5–15)
BUN: 35 mg/dL — ABNORMAL HIGH (ref 8–23)
BUN: 28 mg/dL — ABNORMAL HIGH (ref 8–23)
CO2: 11 mmol/L — ABNORMAL LOW (ref 22–32)
CO2: 28 mmol/L (ref 22–32)
Calcium: 7.5 mg/dL — ABNORMAL LOW (ref 8.9–10.3)
Calcium: 6.7 mg/dL — ABNORMAL LOW (ref 8.9–10.3)
Chloride: 96 mmol/L — ABNORMAL LOW (ref 98–111)
Chloride: 100 mmol/L (ref 98–111)
Creatinine, Ser: 2.88 mg/dL — ABNORMAL HIGH (ref 0.61–1.24)
Creatinine, Ser: 1.66 mg/dL — ABNORMAL HIGH (ref 0.61–1.24)
GFR, Estimated: 24 mL/min — ABNORMAL LOW
GFR, Estimated: 46 mL/min — ABNORMAL LOW
Glucose, Bld: 316 mg/dL — ABNORMAL HIGH (ref 70–99)
Glucose, Bld: 134 mg/dL — ABNORMAL HIGH (ref 70–99)
Potassium: 5.4 mmol/L — ABNORMAL HIGH (ref 3.5–5.1)
Potassium: 3.3 mmol/L — ABNORMAL LOW (ref 3.5–5.1)
Sodium: 133 mmol/L — ABNORMAL LOW (ref 135–145)
Sodium: 140 mmol/L (ref 135–145)

## 2024-05-04 LAB — URINALYSIS, ROUTINE W REFLEX MICROSCOPIC
Bacteria, UA: NONE SEEN
Bilirubin Urine: NEGATIVE
Glucose, UA: NEGATIVE mg/dL
Ketones, ur: NEGATIVE mg/dL
Leukocytes,Ua: NEGATIVE
Nitrite: NEGATIVE
Protein, ur: 30 mg/dL — AB
Specific Gravity, Urine: 1.019 (ref 1.005–1.030)
Squamous Epithelial / HPF: 0 /HPF (ref 0–5)
pH: 5 (ref 5.0–8.0)

## 2024-05-04 LAB — BLOOD GAS, ARTERIAL
Acid-Base Excess: 6.7 mmol/L — ABNORMAL HIGH (ref 0.0–2.0)
Acid-base deficit: 3.7 mmol/L — ABNORMAL HIGH (ref 0.0–2.0)
Bicarbonate: 23.1 mmol/L (ref 20.0–28.0)
Bicarbonate: 30.4 mmol/L — ABNORMAL HIGH (ref 20.0–28.0)
FIO2: 40 %
FIO2: 40 %
MECHVT: 500 mL
MECHVT: 500 mL
Mechanical Rate: 22
Mechanical Rate: 28
O2 Saturation: 93.3 %
O2 Saturation: 98.6 %
PEEP: 5 cmH2O
PEEP: 5 cmH2O
Patient temperature: 37
Patient temperature: 37
RATE: 28 {breaths}/min
pCO2 arterial: 48 mmHg (ref 32–48)
pCO2 arterial: 39 mmHg (ref 32–48)
pH, Arterial: 7.29 — ABNORMAL LOW (ref 7.35–7.45)
pH, Arterial: 7.5 — ABNORMAL HIGH (ref 7.35–7.45)
pO2, Arterial: 65 mmHg — ABNORMAL LOW (ref 83–108)
pO2, Arterial: 87 mmHg (ref 83–108)

## 2024-05-04 LAB — COMPREHENSIVE METABOLIC PANEL WITH GFR
ALT: 2503 U/L — ABNORMAL HIGH (ref 0–44)
AST: 3895 U/L — ABNORMAL HIGH (ref 15–41)
Albumin: 3.4 g/dL — ABNORMAL LOW (ref 3.5–5.0)
Alkaline Phosphatase: 128 U/L — ABNORMAL HIGH (ref 38–126)
Anion gap: 28 — ABNORMAL HIGH (ref 5–15)
BUN: 37 mg/dL — ABNORMAL HIGH (ref 8–23)
CO2: 10 mmol/L — ABNORMAL LOW (ref 22–32)
Calcium: 8.3 mg/dL — ABNORMAL LOW (ref 8.9–10.3)
Chloride: 94 mmol/L — ABNORMAL LOW (ref 98–111)
Creatinine, Ser: 3.53 mg/dL — ABNORMAL HIGH (ref 0.61–1.24)
GFR, Estimated: 19 mL/min — ABNORMAL LOW
Glucose, Bld: 260 mg/dL — ABNORMAL HIGH (ref 70–99)
Potassium: 4.6 mmol/L (ref 3.5–5.1)
Sodium: 132 mmol/L — ABNORMAL LOW (ref 135–145)
Total Bilirubin: 1 mg/dL (ref 0.0–1.2)
Total Protein: 5.9 g/dL — ABNORMAL LOW (ref 6.5–8.1)

## 2024-05-04 LAB — URINE DRUG SCREEN
Amphetamines: NEGATIVE
Barbiturates: NEGATIVE
Benzodiazepines: POSITIVE — AB
Cocaine: NEGATIVE
Fentanyl: NEGATIVE
Methadone Scn, Ur: NEGATIVE
Opiates: NEGATIVE
Tetrahydrocannabinol: NEGATIVE

## 2024-05-04 LAB — CBC
HCT: 41.8 % (ref 39.0–52.0)
Hemoglobin: 13.2 g/dL (ref 13.0–17.0)
MCH: 31.9 pg (ref 26.0–34.0)
MCHC: 31.6 g/dL (ref 30.0–36.0)
MCV: 101 fL — ABNORMAL HIGH (ref 80.0–100.0)
Platelets: 81 K/uL — ABNORMAL LOW (ref 150–400)
RBC: 4.14 MIL/uL — ABNORMAL LOW (ref 4.22–5.81)
RDW: 16.3 % — ABNORMAL HIGH (ref 11.5–15.5)
WBC: 8.3 K/uL (ref 4.0–10.5)
nRBC: 0 % (ref 0.0–0.2)

## 2024-05-04 LAB — MAGNESIUM
Magnesium: 3.1 mg/dL — ABNORMAL HIGH (ref 1.7–2.4)
Magnesium: 3.4 mg/dL — ABNORMAL HIGH (ref 1.7–2.4)
Magnesium: 2.3 mg/dL (ref 1.7–2.4)

## 2024-05-04 LAB — URINALYSIS, W/ REFLEX TO CULTURE (INFECTION SUSPECTED)
Bacteria, UA: NONE SEEN
Bilirubin Urine: NEGATIVE
Glucose, UA: NEGATIVE mg/dL
Ketones, ur: NEGATIVE mg/dL
Leukocytes,Ua: NEGATIVE
Nitrite: NEGATIVE
Protein, ur: 30 mg/dL — AB
Specific Gravity, Urine: 1.021 (ref 1.005–1.030)
pH: 5 (ref 5.0–8.0)

## 2024-05-04 LAB — SALICYLATE LEVEL
Salicylate Lvl: 10.3 mg/dL (ref 7.0–30.0)
Salicylate Lvl: 8.7 mg/dL (ref 7.0–30.0)

## 2024-05-04 LAB — BLOOD GAS, VENOUS
Acid-base deficit: 20.9 mmol/L — ABNORMAL HIGH (ref 0.0–2.0)
Bicarbonate: 11.8 mmol/L — ABNORMAL LOW (ref 20.0–28.0)
O2 Saturation: 58.5 %
Patient temperature: 37
pCO2, Ven: 56 mmHg (ref 44–60)
pH, Ven: <6.95 — CL (ref 7.25–7.43)
pO2, Ven: 44 mmHg (ref 32–45)

## 2024-05-04 LAB — RESP PANEL BY RT-PCR (RSV, FLU A&B, COVID)  RVPGX2
Influenza A by PCR: NEGATIVE
Influenza B by PCR: NEGATIVE
Resp Syncytial Virus by PCR: NEGATIVE
SARS Coronavirus 2 by RT PCR: NEGATIVE

## 2024-05-04 LAB — GLUCOSE, CAPILLARY
Glucose-Capillary: 103 mg/dL — ABNORMAL HIGH (ref 70–99)
Glucose-Capillary: 105 mg/dL — ABNORMAL HIGH (ref 70–99)
Glucose-Capillary: 182 mg/dL — ABNORMAL HIGH (ref 70–99)

## 2024-05-04 LAB — CK
Total CK: 411 U/L — ABNORMAL HIGH (ref 49–397)
Total CK: 677 U/L — ABNORMAL HIGH (ref 49–397)

## 2024-05-04 LAB — ACETAMINOPHEN LEVEL: Acetaminophen (Tylenol), Serum: <10 ug/mL — ABNORMAL LOW (ref 10–30)

## 2024-05-04 LAB — PROCALCITONIN: Procalcitonin: 63.2 ng/mL

## 2024-05-04 LAB — TROPONIN T, HIGH SENSITIVITY
Troponin T High Sensitivity: 32 ng/L — ABNORMAL HIGH (ref 0–19)
Troponin T High Sensitivity: 37 ng/L — ABNORMAL HIGH (ref 0–19)

## 2024-05-04 LAB — HEMOGLOBIN A1C
Hgb A1c MFr Bld: 5.5 % (ref 4.8–5.6)
Mean Plasma Glucose: 111.15 mg/dL

## 2024-05-04 LAB — PHOSPHORUS
Phosphorus: 8.2 mg/dL — ABNORMAL HIGH (ref 2.5–4.6)
Phosphorus: 2.3 mg/dL — ABNORMAL LOW (ref 2.5–4.6)

## 2024-05-04 LAB — ETHANOL: Alcohol, Ethyl (B): <15 mg/dL

## 2024-05-04 LAB — PROTIME-INR
INR: 3.5 — ABNORMAL HIGH (ref 0.8–1.2)
Prothrombin Time: 36.7 s — ABNORMAL HIGH (ref 11.4–15.2)

## 2024-05-04 LAB — LACTIC ACID, PLASMA
Lactic Acid, Venous: 3.7 mmol/L (ref 0.5–1.9)
Lactic Acid, Venous: >9 mmol/L (ref 0.5–1.9)

## 2024-05-04 LAB — CBG MONITORING, ED
Glucose-Capillary: 103 mg/dL — ABNORMAL HIGH (ref 70–99)
Glucose-Capillary: 308 mg/dL — ABNORMAL HIGH (ref 70–99)
Glucose-Capillary: 317 mg/dL — ABNORMAL HIGH (ref 70–99)
Glucose-Capillary: 334 mg/dL — ABNORMAL HIGH (ref 70–99)

## 2024-05-04 LAB — SAMPLE TO BLOOD BANK

## 2024-05-04 LAB — MRSA NEXT GEN BY PCR, NASAL: MRSA by PCR Next Gen: NOT DETECTED

## 2024-05-04 LAB — AMMONIA: Ammonia: 40 umol/L — ABNORMAL HIGH (ref 9–35)

## 2024-05-04 LAB — C-REACTIVE PROTEIN: CRP: 9.4 mg/dL — ABNORMAL HIGH

## 2024-05-04 LAB — BETA-HYDROXYBUTYRIC ACID: Beta-Hydroxybutyric Acid: 0.93 mmol/L — ABNORMAL HIGH (ref 0.05–0.27)

## 2024-05-04 LAB — HIV ANTIBODY (ROUTINE TESTING W REFLEX): HIV Screen 4th Generation wRfx: NONREACTIVE

## 2024-05-04 LAB — D-DIMER, QUANTITATIVE: D-Dimer, Quant: 3.1 ug{FEU}/mL — ABNORMAL HIGH (ref 0.00–0.50)

## 2024-05-04 MED ORDER — SODIUM CHLORIDE 0.9 % IV BOLUS (SEPSIS)
2000.0000 mL | Freq: Once | INTRAVENOUS | Status: AC
  Administered 2024-05-04: 2000 mL via INTRAVENOUS

## 2024-05-04 MED ORDER — METRONIDAZOLE 500 MG/100ML IV SOLN
500.0000 mg | Freq: Once | INTRAVENOUS | Status: AC
  Administered 2024-05-04: 500 mg via INTRAVENOUS
  Filled 2024-05-04: qty 100

## 2024-05-04 MED ORDER — FENTANYL CITRATE (PF) 50 MCG/ML IJ SOSY: 25.0000 ug | PREFILLED_SYRINGE | Freq: Once | INTRAMUSCULAR | Status: DC

## 2024-05-04 MED ORDER — SODIUM CHLORIDE 0.9 % IV SOLN
10.0000 mg/kg | Freq: Two times a day (BID) | INTRAVENOUS | Status: AC
  Administered 2024-05-04 – 2024-05-06 (×4): 1010 mg via INTRAVENOUS
  Filled 2024-05-04 (×4): qty 1.01

## 2024-05-04 MED ORDER — ROCURONIUM BROMIDE 10 MG/ML (PF) SYRINGE
PREFILLED_SYRINGE | INTRAVENOUS | Status: AC
  Administered 2024-05-04: 100 mg via INTRAVENOUS
  Filled 2024-05-04: qty 10

## 2024-05-04 MED ORDER — PIPERACILLIN-TAZOBACTAM 3.375 G IVPB
3.3750 g | Freq: Three times a day (TID) | INTRAVENOUS | Status: DC
  Administered 2024-05-04 – 2024-05-11 (×22): 3.375 g via INTRAVENOUS
  Filled 2024-05-04 (×22): qty 50

## 2024-05-04 MED ORDER — ORAL CARE MOUTH RINSE
15.0000 mL | OROMUCOSAL | Status: DC
  Administered 2024-05-04 – 2024-05-12 (×94): 15 mL via OROMUCOSAL

## 2024-05-04 MED ORDER — NOREPINEPHRINE 4 MG/250ML-% IV SOLN
INTRAVENOUS | Status: AC
  Administered 2024-05-04: 10 ug/min via INTRAVENOUS
  Filled 2024-05-04: qty 250

## 2024-05-04 MED ORDER — THIAMINE HCL 100 MG/ML IJ SOLN
100.0000 mg | Freq: Once | INTRAMUSCULAR | Status: AC
  Administered 2024-05-04: 100 mg via INTRAVENOUS
  Filled 2024-05-04: qty 2

## 2024-05-04 MED ORDER — SODIUM CHLORIDE 0.9 % IV SOLN: INTRAVENOUS | Status: AC

## 2024-05-04 MED ORDER — IPRATROPIUM-ALBUTEROL 0.5-2.5 (3) MG/3ML IN SOLN: 3.0000 mL | Freq: Four times a day (QID) | RESPIRATORY_TRACT | Status: DC | PRN

## 2024-05-04 MED ORDER — SODIUM BICARBONATE 8.4 % IV SOLN
150.0000 meq | Freq: Once | INTRAVENOUS | Status: AC
  Administered 2024-05-04: 150 meq via INTRAVENOUS
  Filled 2024-05-04: qty 150

## 2024-05-04 MED ORDER — ETOMIDATE 2 MG/ML IV SOLN
INTRAVENOUS | Status: AC | PRN
  Administered 2024-05-04: 20 mg via INTRAVENOUS

## 2024-05-04 MED ORDER — NOREPINEPHRINE 4 MG/250ML-% IV SOLN
0.0000 ug/min | INTRAVENOUS | Status: DC
  Administered 2024-05-04: 10 ug/min via INTRAVENOUS
  Administered 2024-05-04: 20 ug/min via INTRAVENOUS
  Administered 2024-05-06: 3 ug/min via INTRAVENOUS
  Filled 2024-05-04 (×4): qty 250

## 2024-05-04 MED ORDER — MAGNESIUM SULFATE 2 GM/50ML IV SOLN
2.0000 g | Freq: Once | INTRAVENOUS | Status: AC
  Administered 2024-05-04: 2 g via INTRAVENOUS
  Filled 2024-05-04: qty 50

## 2024-05-04 MED ORDER — SODIUM BICARBONATE 8.4 % IV SOLN
INTRAVENOUS | Status: DC
  Filled 2024-05-04: qty 1000

## 2024-05-04 MED ORDER — ETOMIDATE 2 MG/ML IV SOLN
INTRAVENOUS | Status: AC
  Filled 2024-05-04: qty 10

## 2024-05-04 MED ORDER — ORAL CARE MOUTH RINSE: 15.0000 mL | OROMUCOSAL | Status: DC | PRN

## 2024-05-04 MED ORDER — POLYETHYLENE GLYCOL 3350 17 G PO PACK
17.0000 g | PACK | Freq: Every day | ORAL | Status: DC | PRN
  Filled 2024-05-04: qty 1

## 2024-05-04 MED ORDER — ROCURONIUM BROMIDE 10 MG/ML (PF) SYRINGE
PREFILLED_SYRINGE | INTRAVENOUS | Status: AC | PRN
  Administered 2024-05-04: 100 mg via INTRAVENOUS

## 2024-05-04 MED ORDER — SODIUM CHLORIDE 0.9 % IV BOLUS (SEPSIS)
1000.0000 mL | Freq: Once | INTRAVENOUS | Status: AC
  Administered 2024-05-04: 1000 mL via INTRAVENOUS

## 2024-05-04 MED ORDER — SENNA 8.6 MG PO TABS: 1.0000 | ORAL_TABLET | Freq: Two times a day (BID) | ORAL | Status: DC | PRN

## 2024-05-04 MED ORDER — LORAZEPAM 2 MG/ML IJ SOLN: 1.0000 mg | Freq: Once | INTRAMUSCULAR | Status: DC

## 2024-05-04 MED ORDER — LORAZEPAM 2 MG/ML IJ SOLN
1.0000 mg | Freq: Once | INTRAMUSCULAR | Status: AC
  Administered 2024-05-04: 1 mg via INTRAVENOUS

## 2024-05-04 MED ORDER — STERILE WATER FOR INJECTION IV SOLN
INTRAVENOUS | Status: DC
  Filled 2024-05-04 (×3): qty 150
  Filled 2024-05-04: qty 1000
  Filled 2024-05-04 (×2): qty 150
  Filled 2024-05-04 (×2): qty 1000

## 2024-05-04 MED ORDER — LORAZEPAM 2 MG/ML IJ SOLN
INTRAMUSCULAR | Status: AC
  Administered 2024-05-04: 1 mg via INTRAVENOUS
  Filled 2024-05-04: qty 1

## 2024-05-04 MED ORDER — INSULIN ASPART 100 UNIT/ML IJ SOLN
0.0000 [IU] | INTRAMUSCULAR | Status: DC
  Administered 2024-05-04: 3 [IU] via SUBCUTANEOUS
  Administered 2024-05-04: 11 [IU] via SUBCUTANEOUS
  Administered 2024-05-05 – 2024-05-08 (×11): 2 [IU] via SUBCUTANEOUS
  Administered 2024-05-08: 3 [IU] via SUBCUTANEOUS
  Administered 2024-05-09 (×4): 2 [IU] via SUBCUTANEOUS
  Administered 2024-05-09: 3 [IU] via SUBCUTANEOUS
  Administered 2024-05-09 – 2024-05-11 (×11): 2 [IU] via SUBCUTANEOUS
  Administered 2024-05-12: 3 [IU] via SUBCUTANEOUS
  Filled 2024-05-04: qty 2
  Filled 2024-05-04: qty 11
  Filled 2024-05-04 (×2): qty 2
  Filled 2024-05-04: qty 5
  Filled 2024-05-04 (×9): qty 2
  Filled 2024-05-04: qty 5
  Filled 2024-05-04: qty 3
  Filled 2024-05-04: qty 5
  Filled 2024-05-04 (×5): qty 2
  Filled 2024-05-04: qty 3
  Filled 2024-05-04: qty 2
  Filled 2024-05-04: qty 3
  Filled 2024-05-04: qty 4
  Filled 2024-05-04 (×5): qty 2

## 2024-05-04 MED ORDER — LORAZEPAM 2 MG/ML IJ SOLN: 1.0000 mg | Freq: Once | INTRAMUSCULAR | Status: AC

## 2024-05-04 MED ORDER — INSULIN ASPART 100 UNIT/ML IJ SOLN
0.0000 [IU] | INTRAMUSCULAR | Status: DC
  Administered 2024-05-04: 11 [IU] via SUBCUTANEOUS
  Filled 2024-05-04: qty 11

## 2024-05-04 MED ORDER — TETANUS-DIPHTH-ACELL PERTUSSIS 5-2-15.5 LF-MCG/0.5 IM SUSP
0.5000 mL | Freq: Once | INTRAMUSCULAR | Status: AC
  Administered 2024-05-04: 0.5 mL via INTRAMUSCULAR
  Filled 2024-05-04: qty 0.5

## 2024-05-04 MED ORDER — FENTANYL 2500MCG IN NS 250ML (10MCG/ML) PREMIX INFUSION
0.0000 ug/h | INTRAVENOUS | Status: DC
  Administered 2024-05-04: 25 ug/h via INTRAVENOUS
  Administered 2024-05-09: 100 ug/h via INTRAVENOUS
  Administered 2024-05-10 – 2024-05-11 (×3): 125 ug/h via INTRAVENOUS
  Filled 2024-05-04 (×7): qty 250

## 2024-05-04 MED ORDER — ROCURONIUM BROMIDE 10 MG/ML (PF) SYRINGE: 100.0000 mg | PREFILLED_SYRINGE | Freq: Once | INTRAVENOUS | Status: AC

## 2024-05-04 MED ORDER — VASOPRESSIN 20 UNITS/100 ML INFUSION FOR SHOCK
0.0000 [IU]/min | INTRAVENOUS | Status: DC
  Filled 2024-05-04: qty 100

## 2024-05-04 MED ORDER — SODIUM BICARBONATE 8.4 % IV SOLN
100.0000 meq | Freq: Once | INTRAVENOUS | Status: AC
  Administered 2024-05-04: 100 meq via INTRAVENOUS
  Filled 2024-05-04: qty 100

## 2024-05-04 MED ORDER — SODIUM BICARBONATE 8.4 % IV SOLN
INTRAVENOUS | Status: AC | PRN
  Administered 2024-05-04: 150 meq via INTRAVENOUS

## 2024-05-04 MED ORDER — FENTANYL BOLUS VIA INFUSION
25.0000 ug | INTRAVENOUS | Status: DC | PRN
  Administered 2024-05-05 – 2024-05-06 (×3): 50 ug via INTRAVENOUS
  Administered 2024-05-06: 100 ug via INTRAVENOUS
  Administered 2024-05-06 – 2024-05-07 (×4): 50 ug via INTRAVENOUS
  Administered 2024-05-08 (×2): 75 ug via INTRAVENOUS
  Administered 2024-05-09 – 2024-05-11 (×7): 100 ug via INTRAVENOUS

## 2024-05-04 MED ORDER — CHLORHEXIDINE GLUCONATE CLOTH 2 % EX PADS
6.0000 | MEDICATED_PAD | Freq: Every day | CUTANEOUS | Status: DC
  Administered 2024-05-05 – 2024-05-09 (×5): 6 via TOPICAL
  Filled 2024-05-04: qty 6

## 2024-05-04 MED ORDER — HEPARIN SODIUM (PORCINE) 5000 UNIT/ML IJ SOLN
5000.0000 [IU] | Freq: Three times a day (TID) | INTRAMUSCULAR | Status: DC
  Administered 2024-05-04 (×2): 5000 [IU] via SUBCUTANEOUS
  Filled 2024-05-04 (×2): qty 1

## 2024-05-04 MED ORDER — SODIUM CHLORIDE 0.9 % IV SOLN
15.0000 mg/kg | Freq: Once | INTRAVENOUS | Status: AC
  Administered 2024-05-04: 1520 mg via INTRAVENOUS
  Filled 2024-05-04: qty 1.52

## 2024-05-04 MED ORDER — VANCOMYCIN HCL IN DEXTROSE 1-5 GM/200ML-% IV SOLN
1000.0000 mg | Freq: Once | INTRAVENOUS | Status: AC
  Administered 2024-05-04: 1000 mg via INTRAVENOUS
  Filled 2024-05-04: qty 200

## 2024-05-04 MED ORDER — ETOMIDATE 2 MG/ML IV SOLN
20.0000 mg | Freq: Once | INTRAVENOUS | Status: AC
  Administered 2024-05-04: 20 mg via INTRAVENOUS

## 2024-05-04 MED ORDER — PROPOFOL 1000 MG/100ML IV EMUL
0.0000 ug/kg/min | INTRAVENOUS | Status: DC
  Administered 2024-05-04: 8.729 ug/kg/min via INTRAVENOUS
  Administered 2024-05-04: 5 ug/kg/min via INTRAVENOUS
  Administered 2024-05-05: 20 ug/kg/min via INTRAVENOUS
  Administered 2024-05-05 (×3): 40 ug/kg/min via INTRAVENOUS
  Administered 2024-05-05 – 2024-05-06 (×3): 20 ug/kg/min via INTRAVENOUS
  Administered 2024-05-06: 30 ug/kg/min via INTRAVENOUS
  Administered 2024-05-06: 50 ug/kg/min via INTRAVENOUS
  Administered 2024-05-07: 30 ug/kg/min via INTRAVENOUS
  Administered 2024-05-07: 35 ug/kg/min via INTRAVENOUS
  Administered 2024-05-07 – 2024-05-08 (×4): 40 ug/kg/min via INTRAVENOUS
  Administered 2024-05-08: 20 ug/kg/min via INTRAVENOUS
  Administered 2024-05-08: 30 ug/kg/min via INTRAVENOUS
  Administered 2024-05-08 (×2): 50 ug/kg/min via INTRAVENOUS
  Administered 2024-05-09 (×4): 30 ug/kg/min via INTRAVENOUS
  Administered 2024-05-10 (×4): 40 ug/kg/min via INTRAVENOUS
  Administered 2024-05-11: 30 ug/kg/min via INTRAVENOUS
  Administered 2024-05-11: 40 ug/kg/min via INTRAVENOUS
  Administered 2024-05-11: 30 ug/kg/min via INTRAVENOUS
  Administered 2024-05-11 (×2): 40 ug/kg/min via INTRAVENOUS
  Administered 2024-05-12: 35 ug/kg/min via INTRAVENOUS
  Filled 2024-05-04 (×41): qty 100

## 2024-05-04 MED ORDER — LORAZEPAM 2 MG/ML IJ SOLN
2.0000 mg | Freq: Once | INTRAMUSCULAR | Status: DC
  Filled 2024-05-04: qty 1

## 2024-05-04 MED ORDER — SENNA 8.6 MG PO TABS
1.0000 | ORAL_TABLET | Freq: Two times a day (BID) | ORAL | Status: DC
  Administered 2024-05-04 – 2024-05-12 (×15): 8.6 mg
  Filled 2024-05-04 (×16): qty 1

## 2024-05-04 MED ORDER — SODIUM CHLORIDE 0.9 % IV SOLN
2.0000 g | Freq: Once | INTRAVENOUS | Status: AC
  Administered 2024-05-04: 2 g via INTRAVENOUS
  Filled 2024-05-04: qty 12.5

## 2024-05-04 MED ORDER — FAMOTIDINE 20 MG PO TABS
20.0000 mg | ORAL_TABLET | Freq: Two times a day (BID) | ORAL | Status: DC
  Administered 2024-05-04 – 2024-05-12 (×17): 20 mg
  Filled 2024-05-04 (×17): qty 1

## 2024-05-04 MED ORDER — POLYETHYLENE GLYCOL 3350 17 G PO PACK
17.0000 g | PACK | Freq: Every day | ORAL | Status: DC
  Administered 2024-05-04 – 2024-05-11 (×5): 17 g
  Filled 2024-05-04 (×5): qty 1

## 2024-05-04 NOTE — ED Notes (Signed)
 Report given to ICU NURSE

## 2024-05-04 NOTE — ED Triage Notes (Signed)
 Pt in via [facility] with AMS and hypothermia - found naked outside lying on gravel in 17 degree weather. Per EMS, pt was unresponsive initially on their arrival, core temp of 84. Arrives to ED altered, father states pt was last seen around 0230 but thinks he had been drinking ETOH and appeared intoxicated.  Abdomen mottled on ED arrival, pt altered and pulling at wires.   VS w/EMS: 108/100 110HR CBG 208

## 2024-05-04 NOTE — Progress Notes (Signed)
 Transported pt to CT via vent with no distress nor issues to report. RN Therisa @ bedside.

## 2024-05-04 NOTE — ED Notes (Signed)
 [facility] d/c pt core temp 99.6. This RN called for pharmacy to tube another sodium bicarb infusion. Tube station is down so they will hand deliver it.

## 2024-05-04 NOTE — Progress Notes (Signed)
 CODE SEPSIS - PHARMACY COMMUNICATION  **Broad Spectrum Antibiotics should be administered within 1 hour of Sepsis diagnosis**  Time Code Sepsis Called/Page Received: 0305  Antibiotics Ordered: cefepime , metronidazole , and vancomycin   Time of 1st antibiotic administration: 0410  Additional action taken by pharmacy: Messaged RN  If necessary, Name of Provider/Nurse Contacted: None    Damien Napoleon ,PharmD Clinical Pharmacist  05/04/2024  3:06 AM

## 2024-05-04 NOTE — H&P (Signed)
 "  NAME:  Larry French, MRN:  [id number], DOB:  1961-01-19, LOS: 0 ADMISSION DATE:  05/04/2024, CONSULTATION DATE:  05/03/24 REFERRING MD:  Dr. Neomi, CHIEF COMPLAINT:  AMS   History of Present Illness:  Larry French is a 64 y.o. male with history of COPD, alcohol and benzodiazepine abuse, hypertension, hyperlipidemia and CAD who presented to the [facility] ED after he was found down outside of his father's house with altered mental status.    History gathered per chart review EMS reports they had an axillary temperature of 88, and were unable to get an oxygen saturation or blood pressure at the time of arrival on scene.  Blood glucose was in the low 100s.  It was reported that the patient was awake and breathing on his own but unable to answer questions or following commands.  Intermittently agitated. Per EMS, father told him that if he wanted to come inside he would have to crawl inside. On arrival to the home, it was reported he was found outside naked with various abrasions scattered throughout his body after attempting to crawl inside.  It is unknown how long he was outside. The patient did not make it inside. His father found him and called 911.   The father states that the last time he saw patient was around suppertime, around 5 to 6 PM, stating he was normal at that time. He states that he woke up around 1:30 AM and noticed that his bedroom light was on and found the patient lying on the ground outside of their carport in the gravel driveway. States he was awake but could not talk much. When asked why he thought the patient was unable to talk father states that because he was cold. His father does not think that he was intoxicated but states that he does not know what medications the patient takes or if he has used any drugs or alcohol recently. It is reported the last time he notes that the patient was actively drinking was about 2 weeks ago. He states the patient did fall about 2  days ago because he loses his balance often. Denied any other known injury. No known recent fevers, cough, vomiting or diarrhea. He does not think that the patient had any toxic alcohols or illicit drugs but is not sure. He does report that the patient takes Anastacia powders regularly. Denies that the patient has had any recent suicidal thoughts and does not think that this was a suicide attempt.   ED course: Upon arrival to the ED, the patient was profoundly hypotensive requiring emergent central line placement for vasopressor support given difficulty with access. Although he was lethargic but awake on arrival, he did become more lethargic and agitated requiring sedation and ultimately was emergently intubated at bedside by the EDP for airway protection. He was put on the [facility] and started on warmed IVFs with heated packs to assist with severe hypothermia. He was found to have severe AGMA and was given 3 amps of bicarb followed by a bicarb gtt. Mild lactic acidosis iso sepsis but found to be + for salicylates. Poison control contacted and directed to start fomepizole  for potential toxic ethanol ingestion. Nephrology contacted with concern for the need of CRRT and plan to see him during the day.   Vitals on Arrival: temp: 85.4 F, BP 61/45, HR 82, RR 16, SpO2 78%  Pertinent Labs/Diagnostics: Found to be with profound AG metabolic acidosis and lactic acidosis iso of acute renal failure,  rhabdomyolysis and with a supratherapeutic INR.  LILLETTE Robet Kim, PA-C personally viewed and interpreted this ECG. EKG Interpretation: Date: 05/03/24, EKG Time: 0320, Rate: 102, Rhythm: irregularly irregular, QRS Duration:  131 Intervals: prolonged QT interval, ST/T Wave abnormalities: severe global ischemia without acute ST elevation, Narrative Interpretation: atrial fibrillation, PVCs, with severe global ischemia, hx of inferior STEMI  Chemistry: Na+:132, K+: 436, BUN/Cr.: 37/3.53, Serum CO2/ AG: 10/28, Mg: 3.1, Alk  Phos 128, AST/ALT: 3895/2503, CK 411, Beta Hydoxy: 0.93, Glucose: 260  Hematology: WBC: 9.3, Hgb: 12.7, Platelets 103, INR 3.5 Troponin: 32, Lactic/ PCT: 3.7 >> >9.0/ 63.20, COVID-19 & Influenza A/B: negative ABG: ph 6.95 pCO2 56, pO2 44, acid base deficit 20.9, bicarb 11.8  Ethanol: negative UDS: + benzos  CXR Impression :  1. Patchy airspace consolidation in the base of both lungs, consistent with pneumonia or aspiration. Mid and upper lungs remain clear. 2. Endotracheal tube and nasogastric tube are in satisfactory position.  CT Head Impression:  1. No acute intracranial abnormality. 2. Chronic microvascular ischemic disease in bilateral cerebral hemispheres deep and periventricular white matter. 3. Chronic left thalamic lacunar infarctions. 4. Atherosclerotic calcifications within the cavernous internal carotid and vertebral arteries.  CT C Spine Impression: 1. No evidence of fracture or acute traumatic injury. 2. Mild diffuse degenerative disc disease throughout the cervical spine.  CT Chest/Abd/Pelvis Impression: 1. Endotracheal tube tip in the proximal right mainstem bronchus; recommend retraction/repositioning. 2. Streaky dependent lower lobe atelectasis/consolidation bilaterally with scattered patchy reticular opacities in the right upper lobe. 3. Healing/healed fractures of the right anterior/lateral 4th through 9th ribs; no evidence of acute traumatic injury. 4. Cardiomegaly with moderate calcific coronary artery disease. 5. Hepatic steatosis and small exophytic right renal cysts. 6. Chronic mild-to-moderate L1 compression deformity and chronic bilateral L5 spondylolysis.  Medications Administered:  NaCl IVF Sodium bicarb amps Ativan  1mg  x 2 Tdap Thiamine  Sodium bicarb gtt 2g magnesium  Cefepime  Vanc Flagyl  Fentanyl  + propofol   PCCM consulted for admission due to respiratory failure requiring mechanical ventilation and circulatory shock requiring  vasopressors.  Pertinent Medical History  Polysubstance abuse - cocaine, opioids Hypertension Hyperlipidemia CAD Anxiety Insomnia Hx of MI 2013 PTSD Tobacco use  Significant Hospital Events: Including procedures, antibiotic start and stop dates in addition to other pertinent events   1/15: Admitted for circulatory shock s/t suspected sepsis, severe hypothermia and acute renal failure with hypoxic respiratory failure requiring mechanical ventilation and vasopressor support. Positive for salicylates, poison control contacted and recommended started fomepizole  after sending off for a toxic ethanol level. PCCM consulted for admission.  Interim History / Subjective:  Worsening lactic acidosis although pressor requirements have decreased. Metabolic acidosis worsening, given additional amps of bicarb and increased the bicarb gtt rate to 200. Nephrology to see him today for possible CRRT.  Objective    Blood pressure (!) 104/53, pulse 77, temperature (!) 84.3 F (29.1 C), resp. rate (!) 24, height 5' 8 (1.727 m), weight 101.2 kg, SpO2 100%.    Vent Mode: PRVC FiO2 (%):  [100 %] 100 % Set Rate:  [22 bmp] 22 bmp Vt Set:  [480 mL] 480 mL PEEP:  [5 cmH20] 5 cmH20  No intake or output data in the 24 hours ending 05/04/24 0446 Filed Weights   05/04/24 0326  Weight: 101.2 kg    Examination: General: Adult male, critically ill, lying in bed intubated & sedated requiring mechanical ventilation, NAD HENT: anicteric sclerae, atraumatic, normocephalic, neck supple, no JVD CV: irregularly irregular, S1 S2, atrial fibrillation on monitor,  no r/m/g Pulm: Regular, non labored on the ventilator , breath sounds equal bilaterally without wheezing, rhonchi or rales GI: soft, non-tender, mild distention, hypoactive bowels  Extremities: cool and dry, peripheral pulses present but thready, no significant edema noted Skin: multiple scattered abrasions on extremities, abdomen and back Neuro: intubated  and sedated, not following commands, does not withdraw from pain, PERRL  GU: foley in place, with very minimal dark yellow urine   Resolved problem list   Assessment and Plan   #Acute Metabolic Encephalopathy #Polysubstance Abuse #Hypothermia Hx: Polysubstance Abuse, Insomnia, PTSD, Alcohol Abuse Head CT: negative for acute intracranial abnormality - Supportive care - PAD protocol: continue Fentanyl  & Propofol  - WUA once neurologic and respiratory parameters met - Continue Bair Hugger and warmed fluids as indicated - Multivitamin and thiamine  - May need phenobarbital taper in the future given history of ETOH - Consider lactulose if ammonia continues to rise - Encourage family at bedside - ICU delirium precautions - Seizure precautions - Hold home klonopin , seroquel  and gabapenin for now  #Acute Hypoxic Respiratory Failure #CAP vs Aspiration Pneumonia #COPD PMHx: COPD - Full vent support - Ventilator settings: PRVC FiO2 60%, 5 PEEP, RR 22, VT 500 - Lung protective strategies - Wean PEEP & FiO2 as tolerated, maintain SpO2 > 90% - Plateau pressures less than 30 cm H20  - Head of bed elevated 30 degrees - VAP protocol in place - Daily WUA with SBT as tolerated  - Ensure adequate pulmonary hygiene  - Duonebs prn - Consider steroids if wheezing becomes present - Intermittent chest x-ray & ABG PRN - Administered 2 amps bicarb and increased bicarb gtt to 200/hr - ABX: vanc and zosyn   #Circulatory Shock s/t Acute Renal Failure vs Sepsis #Elevated Troponin likely d/t Demand Ischemia #Atrial Fibrillation #Non-sustained Ventricular Tachycardia - Continuous cardiac monitoring - Vasopressors to maintain MAP goal > 65 - Currently on levophed  with weaning requirements - Vasopressin  ordered but not yet started - Received 2g IV Magnesium  - Will hold off on amiodarone given no sustained VT - EKG as indicated - Trend lactic and PCT - Lactic on arrival 3.7 >> now >9 - PCT 63 on  arrival - Trend troponin - ECHO pending - Hold home losartan , metoprolol  and plavix  for now  #Severe AGMA with + salicylates #Lactic Acidosis #Acute Renal Failure + Uremia #Rhabdomyolysis - Strict I/O's; inform provider if UO is <0.5/hr - Trend BMP, Mg and Phos - Avoid nephrotoxins as able - Ensure adequate renal perfusion - ICU electrolyte replacement protocol as indicated - Poison control contacted and recommend repeat salicylate - Appreciate recommendations per Poison Control - Continue fomepizole  for suspected toxic alcohol ingestion - Continue IVFs and trend CK  - Likely will need CRRT - Nephrology consulted; appreciate input  #Transaminitis s/t Shock Liver #Hyperammonemia #Supratherapeutic INR - Trend hepatic function - AST/ALT: 3895/2503 on admission - Ammonia 40; consider lactulose if worsening - Diet: NPO - Constipation protocol prn - GI prophylaxis: Famotidine  20mg  per tube - Continue IVFs and trend CK - Given INR of 3.5, consider Vitamin K  #Thrombocytopenia - Trend CBC - Transfuse if Hgb < 7 or platelets < 7 - Monitor for s/sx of bleeding - VTE prophylaxis: SQ heparin , SCDs  #Type II Diabetes Mellitus - ICU hypo/hyperglycemia protocol - SSI - CBG Q4h - Goal range 140-180  #Suspected Sepsis with unknown etiology - CAP vs Aspiration Pneumonia Initial interventions: IVFs + flagly + vanc + cefepime  - Trend WBC and monitor fever curve - Blood cultures  pending - Respiratory PCR: negative - Respiratory 20 pathogen panel ordered - Broad spectrum ABX: zosyn  and vanc - Narrow pending culture and sensitivities  Labs   CBC: Recent Labs  Lab 05/04/24 0318  WBC 9.3  NEUTROABS 7.3  HGB 12.7*  HCT 40.8  MCV 102.3*  PLT 103*    Basic Metabolic Panel: Recent Labs  Lab 05/04/24 0318  NA 132*  K 4.6  CL 94*  CO2 10*  GLUCOSE 260*  BUN 37*  CREATININE 3.53*  CALCIUM  8.3*  MG 3.1*   GFR: Estimated Creatinine Clearance: 25 mL/min (A) (by C-G  formula based on SCr of 3.53 mg/dL (H)). Recent Labs  Lab 05/04/24 0318  PROCALCITON 63.20  WBC 9.3  LATICACIDVEN 3.7*    Liver Function Tests: Recent Labs  Lab 05/04/24 0318  AST 3,895*  ALT 2,503*  ALKPHOS 128*  BILITOT 1.0  PROT 5.9*  ALBUMIN 3.4*   No results for input(s): LIPASE, AMYLASE in the last 168 hours. Recent Labs  Lab 05/04/24 0318  AMMONIA 40*    ABG    Component Value Date/Time   HCO3 11.8 (L) 05/04/2024 0318   ACIDBASEDEF 20.9 (H) 05/04/2024 0318   O2SAT 58.5 05/04/2024 0318     Coagulation Profile: No results for input(s): INR, PROTIME in the last 168 hours.  Cardiac Enzymes: Recent Labs  Lab 05/04/24 0318  CKTOTAL 411*    HbA1C: Hgb A1c MFr Bld  Date/Time Value Ref Range Status  12/31/2020 11:28 AM 5.5 4.8 - 5.6 % Final    Comment:             Prediabetes: 5.7 - 6.4          Diabetes: >6.4          Glycemic control for adults with diabetes: <7.0   09/24/2020 09:51 AM 6.3 (H) 4.8 - 5.6 % Final    Comment:             Prediabetes: 5.7 - 6.4          Diabetes: >6.4          Glycemic control for adults with diabetes: <7.0     CBG: Recent Labs  Lab 05/04/24 0315  GLUCAP 103*    Review of Systems:   Unable to assess as patient is intubated and sedated.  Past Medical History:  He,  has a past medical history of Alcohol abuse, Anxiety, CA - skin cancer, CAD, residual CFX LAD disease (06/01/2011), Depression, Headache(784.0), Hyperlipemia, Hypertension, Insomnia, Mental disorder, MI (myocardial infarction) (HCC), Polysubstance abuse (HCC), and PTSD (post-traumatic stress disorder).   Surgical History:   Past Surgical History:  Procedure Laterality Date   CARDIAC CATHETERIZATION Bilateral 04/09/2015   Procedure: Coronary Angiogram;  Surgeon: Deatrice DELENA Cage, MD;  Location: [facility] INVASIVE CV LAB;  Service: Cardiovascular;  Laterality: Bilateral;   CARDIAC CATHETERIZATION [name] 04/09/2015   Procedure: Coronary Stent  Intervention;  Surgeon: Deatrice DELENA Cage, MD;  Location: [facility] INVASIVE CV LAB;  Service: Cardiovascular;  Laterality: [name];   COLONOSCOPY WITH PROPOFOL  [name] 03/19/2019   Procedure: COLONOSCOPY WITH PROPOFOL ;  Surgeon: Unk Corinn Skiff, MD;  Location: [facility] ENDOSCOPY;  Service: Gastroenterology;  Laterality: [name];   LEFT HEART CATH Right 05/29/2011   Procedure: LEFT HEART CATH;  Surgeon: Alm LELON Clay, MD;  Location: [facility] CATH LAB;  Service: Cardiovascular;  Laterality: Right;   LEFT HEART CATHETERIZATION WITH CORONARY ANGIOGRAM [name] 06/01/2011   Procedure: LEFT HEART CATHETERIZATION WITH CORONARY ANGIOGRAM;  Surgeon: Alm LELON Clay,  MD;  Location: [facility] CATH LAB;  Service: Cardiovascular;  Laterality: [name];  relook cath, possible PCI   TONSILLECTOMY AND ADENOIDECTOMY       Social History:   reports that he has been smoking cigarettes. He has a 45 pack-year smoking history. He has never used smokeless tobacco. He reports that he does not drink alcohol and does not use drugs.   Family History:  His family history includes Anxiety disorder in his father and mother; Cancer in his mother; Chronic Renal Failure in his mother; Coronary artery disease (age of onset: 60) in his maternal grandfather; Depression in his father and mother; Other in his brother.   Allergies Allergies[1]   Home Medications  Prior to Admission medications  Medication Sig Start Date End Date Taking? Authorizing Provider  aspirin  81 MG EC tablet TAKE ONE TABLET BY MOUTH EVERY DAY 01/09/16   McGowan, Clotilda A, PA-C  clonazePAM  (KLONOPIN ) 1 MG tablet Take 1 tablet (1 mg total) by mouth 3 (three) times daily. 04/23/24     clopidogrel  (PLAVIX ) 75 MG tablet Take 1 tablet (75 mg total) by mouth daily. 05/23/23     doxepin  (SINEQUAN ) 25 MG capsule Take 1-2 capsules (25-50 mg total) by mouth at bedtime. 03/26/24     gabapentin  (NEURONTIN ) 300 MG capsule Take 1 capsule (300 mg total) by mouth 3 (three) times daily. 02/14/24     gabapentin   (NEURONTIN ) 300 MG capsule Take 2 capsules (600 mg total) by mouth 3 (three) times daily. 04/05/24     hydrOXYzine  (VISTARIL ) 25 MG capsule Take 1 capsule (25 mg total) by mouth 2 (two) times daily. 03/12/24     hydrOXYzine  (VISTARIL ) 25 MG capsule Take 1 capsule (25 mg total) by mouth 2 (two) times daily. 04/23/24     lidocaine  (LIDODERM ) 5 % Place 1 patch onto the skin daily Apply patch to the most painful area for up to 12 hours in a 24 hour period. Patient taking differently: Place 1 patch onto the skin as directed. PRN 01/17/24     losartan  (COZAAR ) 100 MG tablet Take 1 tablet (100 mg total) by mouth daily. 10/17/23     losartan  (COZAAR ) 100 MG tablet Take 1 tablet (100 mg total) by mouth daily. 03/12/24     metFORMIN  (GLUCOPHAGE ) 500 MG tablet Take 1 tablet (500 mg total) by mouth daily with breakfast. 01/11/24     metoprolol  tartrate (LOPRESSOR ) 25 MG tablet Take 1 tablet (25 mg total) by mouth 2 (two) times daily. 05/23/23     QUEtiapine  (SEROQUEL ) 200 MG tablet Take 1 tablet (200 mg total) by mouth at bedtime as needed for insomnia. 01/18/24     QUEtiapine  (SEROQUEL ) 400 MG tablet Take 1 tablet (400 mg total) by mouth at bedtime. 02/01/24     QUEtiapine  (SEROQUEL ) 400 MG tablet Take 1 tablet (400 mg total) by mouth at bedtime 03/01/24     rosuvastatin  (CRESTOR ) 20 MG tablet Take 1 tablet (20 mg total) by mouth daily. 01/16/24     tirzepatide  (MOUNJARO ) 10 MG/0.5ML Pen Inject 10 mg into the skin once a week. 07/07/23     tirzepatide  (MOUNJARO ) 10 MG/0.5ML Pen Inject 10 mg into the skin once a week. 08/01/23     tirzepatide  (MOUNJARO ) 12.5 MG/0.5ML Pen Inject 12.5 mg into the skin once a week. 10/24/23     traMADol  (ULTRAM ) 50 MG tablet Take 1 tablet (50 mg total) by mouth 2 (two) times daily as needed. 12/14/23     traZODone  (DESYREL ) 150  MG tablet Take 1 tablet (150 mg total) by mouth at bedtime. 01/11/24     traZODone  (DESYREL ) 150 MG tablet Take 1 tablet (150 mg total) by mouth at bedtime. 03/12/24      umeclidinium-vilanterol (ANORO ELLIPTA ) 62.5-25 MCG/ACT AEPB Inhale 1 puff into the lungs daily. 02/08/24   Isadora Hose, MD  ARIPiprazole  (ABILIFY ) 2 MG tablet Take 1 tablet (2 mg total) by mouth once daily for 120 days 09/01/23 12/08/23       Critical care time: 70 minutes     Robet Kim, PA-C [facility] PCCM Team Contact Info: [phone]          [1] No Known Allergies  "

## 2024-05-04 NOTE — Progress Notes (Signed)
 "  NAME:  Larry French, MRN:  [id number], DOB:  08/15/1960, LOS: 0 ADMISSION DATE:  05/04/2024, CONSULTATION DATE:  05/03/24 REFERRING MD:  Dr. Neomi, CHIEF COMPLAINT:  AMS   History of Present Illness:  Larry French is a 64 y.o. male with history of COPD, alcohol and benzodiazepine abuse, hypertension, hyperlipidemia and CAD who presented to the [facility] ED after he was found down outside of his father's house with altered mental status.    History gathered per chart review EMS reports they had an axillary temperature of 88, and were unable to get an oxygen saturation or blood pressure at the time of arrival on scene.  Blood glucose was in the low 100s.  It was reported that the patient was awake and breathing on his own but unable to answer questions or following commands.  Intermittently agitated. Per EMS, father told him that if he wanted to come inside he would have to crawl inside. On arrival to the home, it was reported he was found outside naked with various abrasions scattered throughout his body after attempting to crawl inside.  It is unknown how long he was outside. The patient did not make it inside. His father found him and called 911.   The father states that the last time he saw patient was around suppertime, around 5 to 6 PM, stating he was normal at that time. He states that he woke up around 1:30 AM and noticed that his bedroom light was on and found the patient lying on the ground outside of their carport in the gravel driveway. States he was awake but could not talk much. When asked why he thought the patient was unable to talk father states that because he was cold. His father does not think that he was intoxicated but states that he does not know what medications the patient takes or if he has used any drugs or alcohol recently. It is reported the last time he notes that the patient was actively drinking was about 2 weeks ago. He states the patient did fall about 2  days ago because he loses his balance often. Denied any other known injury. No known recent fevers, cough, vomiting or diarrhea. He does not think that the patient had any toxic alcohols or illicit drugs but is not sure. He does report that the patient takes Anastacia powders regularly. Denies that the patient has had any recent suicidal thoughts and does not think that this was a suicide attempt.   ED course: Upon arrival to the ED, the patient was profoundly hypotensive requiring emergent central line placement for vasopressor support given difficulty with access. Although he was lethargic but awake on arrival, he did become more lethargic and agitated requiring sedation and ultimately was emergently intubated at bedside by the EDP for airway protection. He was put on the [facility] and started on warmed IVFs with heated packs to assist with severe hypothermia. He was found to have severe AGMA and was given 3 amps of bicarb followed by a bicarb gtt. Mild lactic acidosis iso sepsis but found to be + for salicylates. Poison control contacted and directed to start fomepizole  for potential toxic ethanol ingestion. Nephrology contacted with concern for the need of CRRT and plan to see him during the day.   Vitals on Arrival: temp: 85.4 F, BP 61/45, HR 82, RR 16, SpO2 78%  Pertinent Labs/Diagnostics: Found to be with profound AG metabolic acidosis and lactic acidosis iso of acute renal failure,  rhabdomyolysis and with a supratherapeutic INR.  LILLETTE Robet Kim, PA-C personally viewed and interpreted this ECG. EKG Interpretation: Date: 05/03/24, EKG Time: 0320, Rate: 102, Rhythm: irregularly irregular, QRS Duration:  131 Intervals: prolonged QT interval, ST/T Wave abnormalities: severe global ischemia without acute ST elevation, Narrative Interpretation: atrial fibrillation, PVCs, with severe global ischemia, hx of inferior STEMI  Chemistry: Na+:132, K+: 436, BUN/Cr.: 37/3.53, Serum CO2/ AG: 10/28, Mg: 3.1, Alk  Phos 128, AST/ALT: 3895/2503, CK 411, Beta Hydoxy: 0.93, Glucose: 260  Hematology: WBC: 9.3, Hgb: 12.7, Platelets 103, INR 3.5 Troponin: 32, Lactic/ PCT: 3.7 >> >9.0/ 63.20, COVID-19 & Influenza A/B: negative ABG: ph 6.95 pCO2 56, pO2 44, acid base deficit 20.9, bicarb 11.8  Ethanol: negative UDS: + benzos  CXR Impression :  1. Patchy airspace consolidation in the base of both lungs, consistent with pneumonia or aspiration. Mid and upper lungs remain clear. 2. Endotracheal tube and nasogastric tube are in satisfactory position.  CT Head Impression:  1. No acute intracranial abnormality. 2. Chronic microvascular ischemic disease in bilateral cerebral hemispheres deep and periventricular white matter. 3. Chronic left thalamic lacunar infarctions. 4. Atherosclerotic calcifications within the cavernous internal carotid and vertebral arteries.  CT C Spine Impression: 1. No evidence of fracture or acute traumatic injury. 2. Mild diffuse degenerative disc disease throughout the cervical spine.  CT Chest/Abd/Pelvis Impression: 1. Endotracheal tube tip in the proximal right mainstem bronchus; recommend retraction/repositioning. 2. Streaky dependent lower lobe atelectasis/consolidation bilaterally with scattered patchy reticular opacities in the right upper lobe. 3. Healing/healed fractures of the right anterior/lateral 4th through 9th ribs; no evidence of acute traumatic injury. 4. Cardiomegaly with moderate calcific coronary artery disease. 5. Hepatic steatosis and small exophytic right renal cysts. 6. Chronic mild-to-moderate L1 compression deformity and chronic bilateral L5 spondylolysis.  Medications Administered:  NaCl IVF Sodium bicarb amps Ativan  1mg  x 2 Tdap Thiamine  Sodium bicarb gtt 2g magnesium  Cefepime  Vanc Flagyl  Fentanyl  + propofol   PCCM consulted for admission due to respiratory failure requiring mechanical ventilation and circulatory shock requiring  vasopressors.  Pertinent Medical History  Polysubstance abuse - cocaine, opioids Hypertension Hyperlipidemia CAD Anxiety Insomnia Hx of MI 2013 PTSD Tobacco use  Significant Hospital Events: Including procedures, antibiotic start and stop dates in addition to other pertinent events   1/15: Admitted for circulatory shock s/t suspected sepsis, severe hypothermia and acute renal failure with hypoxic respiratory failure requiring mechanical ventilation and vasopressor support. Positive for salicylates, poison control contacted and recommended started fomepizole  after sending off for a toxic ethanol level. PCCM consulted for admission. 05/04/24- Remains critically ill on MV.  Ordered workup for ethylene glycol poisoning.  Repeat EKG.  RVP. Continue abx. Weaned to 40% PRVC    Objective    Blood pressure 104/65, pulse (!) 105, temperature 100 F (37.8 C), resp. rate 20, height 5' 8 (1.727 m), weight 101.2 kg, SpO2 97%.    Vent Mode: PRVC FiO2 (%):  [40 %-100 %] 40 % Set Rate:  [22 bmp-28 bmp] 28 bmp Vt Set:  [480 mL-500 mL] 500 mL PEEP:  [5 cmH20] 5 cmH20   Intake/Output Summary (Last 24 hours) at 05/04/2024 1539 Last data filed at 05/04/2024 1400 Gross per 24 hour  Intake 5964.38 ml  Output 2150 ml  Net 3814.38 ml   Filed Weights   05/04/24 0326  Weight: 101.2 kg    Examination: General: Adult male, critically ill, lying in bed intubated & sedated requiring mechanical ventilation, NAD HENT: anicteric sclerae, atraumatic, normocephalic, neck supple, no  JVD CV: irregularly irregular, S1 S2, atrial fibrillation on monitor, no r/m/g Pulm: Regular, non labored on the ventilator , breath sounds equal bilaterally without wheezing, rhonchi or rales GI: soft, non-tender, mild distention, hypoactive bowels  Extremities: cool and dry, peripheral pulses present but thready, no significant edema noted Skin: multiple scattered abrasions on extremities, abdomen and back Neuro: intubated  and sedated, not following commands, does not withdraw from pain, PERRL  GU: foley in place, with very minimal dark yellow urine    Assessment and Plan   #Acute Metabolic Encephalopathy #Polysubstance Abuse #Hypothermia Hx: Polysubstance Abuse, Insomnia, PTSD, Alcohol Abuse Head CT: negative for acute intracranial abnormality - Supportive care - PAD protocol: continue Fentanyl  & Propofol  - WUA once neurologic and respiratory parameters met - Continue Bair Hugger and warmed fluids as indicated - Multivitamin and thiamine  - May need phenobarbital taper in the future given history of ETOH - Consider lactulose if ammonia continues to rise - Encourage family at bedside - ICU delirium precautions - Seizure precautions - Hold home klonopin , seroquel  and gabapenin for now  #Acute Hypoxic Respiratory Failure #CAP vs Aspiration Pneumonia #COPD PMHx: COPD - Full vent support - Ventilator settings: PRVC FiO2 60%, 5 PEEP, RR 22, VT 500 - Lung protective strategies - Wean PEEP & FiO2 as tolerated, maintain SpO2 > 90% - Plateau pressures less than 30 cm H20  - Head of bed elevated 30 degrees - VAP protocol in place - Daily WUA with SBT as tolerated  - Ensure adequate pulmonary hygiene  - Duonebs prn - Consider steroids if wheezing becomes present - Intermittent chest x-ray & ABG PRN - Administered 2 amps bicarb and increased bicarb gtt to 200/hr - ABX: vanc and zosyn   #Circulatory Shock s/t Acute Renal Failure vs Sepsis #Elevated Troponin likely d/t Demand Ischemia #Atrial Fibrillation #Non-sustained Ventricular Tachycardia - Continuous cardiac monitoring - Vasopressors to maintain MAP goal > 65 - Currently on levophed  with weaning requirements - Vasopressin  ordered but not yet started - Received 2g IV Magnesium  - Will hold off on amiodarone given no sustained VT - EKG as indicated - Trend lactic and PCT - Lactic on arrival 3.7 >> now >9 - PCT 63 on arrival - Trend  troponin - ECHO pending - Hold home losartan , metoprolol  and plavix  for now  #Severe AGMA with + salicylates #Lactic Acidosis #Acute Renal Failure + Uremia #Rhabdomyolysis - Strict I/O's; inform provider if UO is <0.5/hr - Trend BMP, Mg and Phos - Avoid nephrotoxins as able - Ensure adequate renal perfusion - ICU electrolyte replacement protocol as indicated - Poison control contacted and recommend repeat salicylate - Appreciate recommendations per Poison Control - Continue fomepizole  for suspected toxic alcohol ingestion - Continue IVFs and trend CK  - Likely will need CRRT - Nephrology consulted; appreciate input  #Transaminitis s/t Shock Liver #Hyperammonemia #Supratherapeutic INR - Trend hepatic function - AST/ALT: 3895/2503 on admission - Ammonia 40; consider lactulose if worsening - Diet: NPO - Constipation protocol prn - GI prophylaxis: Famotidine  20mg  per tube - Continue IVFs and trend CK - Given INR of 3.5, consider Vitamin K  #Thrombocytopenia - Trend CBC - Transfuse if Hgb < 7 or platelets < 7 - Monitor for s/sx of bleeding - VTE prophylaxis: SQ heparin , SCDs  #Type II Diabetes Mellitus - ICU hypo/hyperglycemia protocol - SSI - CBG Q4h - Goal range 140-180  #Suspected Sepsis with unknown etiology - CAP vs Aspiration Pneumonia Initial interventions: IVFs + flagly + vanc + cefepime  - Trend WBC and  monitor fever curve - Blood cultures pending - Respiratory PCR: negative - Respiratory 20 pathogen panel ordered - Broad spectrum ABX: zosyn  and vanc - Narrow pending culture and sensitivities  Labs   CBC: Recent Labs  Lab 05/04/24 0318 05/04/24 0518  WBC 9.3 8.3  NEUTROABS 7.3  --   HGB 12.7* 13.2  HCT 40.8 41.8  MCV 102.3* 101.0*  PLT 103* 81*    Basic Metabolic Panel: Recent Labs  Lab 05/04/24 0318 05/04/24 0518  NA 132* 133*  K 4.6 5.4*  CL 94* 96*  CO2 10* 11*  GLUCOSE 260* 316*  BUN 37* 35*  CREATININE 3.53* 2.88*  CALCIUM  8.3*  7.5*  MG 3.1* 3.4*  PHOS  --  8.2*   GFR: Estimated Creatinine Clearance: 30.7 mL/min (A) (by C-G formula based on SCr of 2.88 mg/dL (H)). Recent Labs  Lab 05/04/24 0318 05/04/24 0518  PROCALCITON 63.20  --   WBC 9.3 8.3  LATICACIDVEN 3.7* >9.0*    Liver Function Tests: Recent Labs  Lab 05/04/24 0318  AST 3,895*  ALT 2,503*  ALKPHOS 128*  BILITOT 1.0  PROT 5.9*  ALBUMIN 3.4*   No results for input(s): LIPASE, AMYLASE in the last 168 hours. Recent Labs  Lab 05/04/24 0318  AMMONIA 40*    ABG    Component Value Date/Time   PHART 7.29 (L) 05/04/2024 0900   PCO2ART 48 05/04/2024 0900   PO2ART 65 (L) 05/04/2024 0900   HCO3 23.1 05/04/2024 0900   ACIDBASEDEF 3.7 (H) 05/04/2024 0900   O2SAT 93.3 05/04/2024 0900     Coagulation Profile: Recent Labs  Lab 05/04/24 0433  INR 3.5*    Cardiac Enzymes: Recent Labs  Lab 05/04/24 0318 05/04/24 0938  CKTOTAL 411* 677*    HbA1C: Hgb A1c MFr Bld  Date/Time Value Ref Range Status  05/04/2024 05:18 AM 5.5 4.8 - 5.6 % Final    Comment:    (NOTE) Diagnosis of Diabetes The following HbA1c ranges recommended by the American Diabetes Association (ADA) may be used as an aid in the diagnosis of diabetes mellitus.  Hemoglobin             Suggested A1C NGSP%              Diagnosis  <5.7                   Non Diabetic  5.7-6.4                Pre-Diabetic  >6.4                   Diabetic  <7.0                   Glycemic control for                       adults with diabetes.    12/31/2020 11:28 AM 5.5 4.8 - 5.6 % Final    Comment:             Prediabetes: 5.7 - 6.4          Diabetes: >6.4          Glycemic control for adults with diabetes: <7.0     CBG: Recent Labs  Lab 05/04/24 0315 05/04/24 0751 05/04/24 1010 05/04/24 1157  GLUCAP 103* 308* 317* 334*    Review of Systems:   Unable to assess as patient is intubated and sedated.  Past Medical  History:  He,  has a past medical history of  Alcohol abuse, Anxiety, CA - skin cancer, CAD, residual CFX LAD disease (06/01/2011), Depression, Headache(784.0), Hyperlipemia, Hypertension, Insomnia, Mental disorder, MI (myocardial infarction) (HCC), Polysubstance abuse (HCC), and PTSD (post-traumatic stress disorder).   Surgical History:   Past Surgical History:  Procedure Laterality Date   CARDIAC CATHETERIZATION Bilateral 04/09/2015   Procedure: Coronary Angiogram;  Surgeon: Deatrice DELENA Cage, MD;  Location: [facility] INVASIVE CV LAB;  Service: Cardiovascular;  Laterality: Bilateral;   CARDIAC CATHETERIZATION [name] 04/09/2015   Procedure: Coronary Stent Intervention;  Surgeon: Deatrice DELENA Cage, MD;  Location: [facility] INVASIVE CV LAB;  Service: Cardiovascular;  Laterality: [name];   COLONOSCOPY WITH PROPOFOL  [name] 03/19/2019   Procedure: COLONOSCOPY WITH PROPOFOL ;  Surgeon: Unk Corinn Skiff, MD;  Location: [facility] ENDOSCOPY;  Service: Gastroenterology;  Laterality: [name];   LEFT HEART CATH Right 05/29/2011   Procedure: LEFT HEART CATH;  Surgeon: Alm LELON Clay, MD;  Location: [facility] CATH LAB;  Service: Cardiovascular;  Laterality: Right;   LEFT HEART CATHETERIZATION WITH CORONARY ANGIOGRAM [name] 06/01/2011   Procedure: LEFT HEART CATHETERIZATION WITH CORONARY ANGIOGRAM;  Surgeon: Alm LELON Clay, MD;  Location: [facility] CATH LAB;  Service: Cardiovascular;  Laterality: [name];  relook cath, possible PCI   TONSILLECTOMY AND ADENOIDECTOMY       Social History:   reports that he has been smoking cigarettes. He has a 45 pack-year smoking history. He has never used smokeless tobacco. He reports that he does not drink alcohol and does not use drugs.   Family History:  His family history includes Anxiety disorder in his father and mother; Cancer in his mother; Chronic Renal Failure in his mother; Coronary artery disease (age of onset: 59) in his maternal grandfather; Depression in his father and mother; Other in his brother.   Allergies Allergies[1]   Home Medications  Prior  to Admission medications  Medication Sig Start Date End Date Taking? Authorizing Provider  aspirin  81 MG EC tablet TAKE ONE TABLET BY MOUTH EVERY DAY 01/09/16   McGowan, Clotilda A, PA-C  clonazePAM  (KLONOPIN ) 1 MG tablet Take 1 tablet (1 mg total) by mouth 3 (three) times daily. 04/23/24     clopidogrel  (PLAVIX ) 75 MG tablet Take 1 tablet (75 mg total) by mouth daily. 05/23/23     doxepin  (SINEQUAN ) 25 MG capsule Take 1-2 capsules (25-50 mg total) by mouth at bedtime. 03/26/24     gabapentin  (NEURONTIN ) 300 MG capsule Take 1 capsule (300 mg total) by mouth 3 (three) times daily. 02/14/24     gabapentin  (NEURONTIN ) 300 MG capsule Take 2 capsules (600 mg total) by mouth 3 (three) times daily. 04/05/24     hydrOXYzine  (VISTARIL ) 25 MG capsule Take 1 capsule (25 mg total) by mouth 2 (two) times daily. 03/12/24     hydrOXYzine  (VISTARIL ) 25 MG capsule Take 1 capsule (25 mg total) by mouth 2 (two) times daily. 04/23/24     lidocaine  (LIDODERM ) 5 % Place 1 patch onto the skin daily Apply patch to the most painful area for up to 12 hours in a 24 hour period. Patient taking differently: Place 1 patch onto the skin as directed. PRN 01/17/24     losartan  (COZAAR ) 100 MG tablet Take 1 tablet (100 mg total) by mouth daily. 10/17/23     losartan  (COZAAR ) 100 MG tablet Take 1 tablet (100 mg total) by mouth daily. 03/12/24     metFORMIN  (GLUCOPHAGE ) 500 MG tablet Take 1 tablet (500 mg total) by mouth  daily with breakfast. 01/11/24     metoprolol  tartrate (LOPRESSOR ) 25 MG tablet Take 1 tablet (25 mg total) by mouth 2 (two) times daily. 05/23/23     QUEtiapine  (SEROQUEL ) 200 MG tablet Take 1 tablet (200 mg total) by mouth at bedtime as needed for insomnia. 01/18/24     QUEtiapine  (SEROQUEL ) 400 MG tablet Take 1 tablet (400 mg total) by mouth at bedtime. 02/01/24     QUEtiapine  (SEROQUEL ) 400 MG tablet Take 1 tablet (400 mg total) by mouth at bedtime 03/01/24     rosuvastatin  (CRESTOR ) 20 MG tablet Take 1 tablet (20 mg total) by  mouth daily. 01/16/24     tirzepatide  (MOUNJARO ) 10 MG/0.5ML Pen Inject 10 mg into the skin once a week. 07/07/23     tirzepatide  (MOUNJARO ) 10 MG/0.5ML Pen Inject 10 mg into the skin once a week. 08/01/23     tirzepatide  (MOUNJARO ) 12.5 MG/0.5ML Pen Inject 12.5 mg into the skin once a week. 10/24/23     traMADol  (ULTRAM ) 50 MG tablet Take 1 tablet (50 mg total) by mouth 2 (two) times daily as needed. 12/14/23     traZODone  (DESYREL ) 150 MG tablet Take 1 tablet (150 mg total) by mouth at bedtime. 01/11/24     traZODone  (DESYREL ) 150 MG tablet Take 1 tablet (150 mg total) by mouth at bedtime. 03/12/24     umeclidinium-vilanterol (ANORO ELLIPTA ) 62.5-25 MCG/ACT AEPB Inhale 1 puff into the lungs daily. 02/08/24   Isadora Hose, MD  ARIPiprazole  (ABILIFY ) 2 MG tablet Take 1 tablet (2 mg total) by mouth once daily for 120 days 09/01/23 12/08/23       Critical care provider statement:   Total critical care time: 33 minutes   Performed by: Parris MD   Critical care time was exclusive of separately billable procedures and treating other patients.   Critical care was necessary to treat or prevent imminent or life-threatening deterioration.   Critical care was time spent personally by me on the following activities: development of treatment plan with patient and/or surrogate as well as nursing, discussions with consultants, evaluation of patient's response to treatment, examination of patient, obtaining history from patient or surrogate, ordering and performing treatments and interventions, ordering and review of laboratory studies, ordering and review of radiographic studies, pulse oximetry and re-evaluation of patient's condition.    Kilee Hedding, M.D.  Pulmonary & Critical Care Medicine              [1] No Known Allergies  "

## 2024-05-04 NOTE — Sepsis Progress Note (Signed)
 Following for sepsis monitoring ?

## 2024-05-04 NOTE — Consult Note (Signed)
 " [facility]  CONSULT NOTE    Date: 05/04/2024                  Patient Name:  Larry French  MRN: [id number]  DOB: [birth year]  Age / Sex: 64 y.o., male         PCP: [name], MD                 Service Requesting Consult: Critical care team                 Reason for Consult: Acute kidney injury            History of Present Illness: Mr. Larry French is a 64 y.o.  male with past medical history including CAD, hypertension, PTSD, insomnia, anxiety, and polysubstance abuse, who was admitted to [facility] on 05/04/2024 for Acute respiratory failure (HCC) [J96.00]  Patient presents to the emergency department via EMS after being found naked outside with temperatures less than 20 F.  Patient currently seen in emergency department.  Intubated on vent support with 60% FiO2 and 5 PEEP.  Currently on Levophed  for blood pressure support.  Sedated with fentanyl  and propofol .  IV fluids infusing consist of sodium HCO3.  Patient has had good urine output, 1.4 L.  Labs on ED arrival concerning for sodium 132, chloride 94, serum bicarb 10, glucose 260, BUN 37, creatinine 3.53 with GFR 19, magnesium  3.1, AST 3895, ALT 2503, lactic acid 3.7, and hemoglobin 12.7.  Respiratory panel for influenza, COVID-19, and RSV.  UA appears cloudy.  UDS positive for benzos.  Chest x-ray shows patchy airspace consolidation of both lungs consistent with pneumonia versus aspiration.  CT head and C-spine negative.  CT chest, abdomen, pelvis without contrast confirms patchy opacities in right upper lobe healed rib fractures, and small renal cyst.   Medications: Outpatient medications: (Not in a hospital admission)   Current medications: Current Facility-Administered Medications  Medication Dose Route Frequency Provider Last Rate Last Admin   0.9 %  sodium chloride  infusion   Intravenous Continuous Ward, Kristen N, DO 150 mL/hr at 05/04/24 1118 New Bag at 05/04/24 1118    Chlorhexidine  Gluconate Cloth 2 % PADS 6 each  6 each Topical Daily Bousman, Karlie, PA-C       etomidate  (AMIDATE ) injection   Intravenous Code/Trauma/Sedation Med Ward, Kristen N, DO   20 mg at 05/04/24 0335   famotidine  (PEPCID ) tablet 20 mg  20 mg Per Tube BID Bousman, Karlie, PA-C   20 mg at 05/04/24 9078   fentaNYL  (SUBLIMAZE ) bolus via infusion 25-100 mcg  25-100 mcg Intravenous Q15 min PRN Bousman, Karlie, PA-C       fentaNYL  in NS (10mcg/ml) infusion-PREMIX  0-200 mcg/hr Intravenous Continuous Bousman, Karlie, PA-C 2.5 mL/hr at 05/04/24 1315 25 mcg/hr at 05/04/24 1315   heparin  injection 5,000 Units  5,000 Units Subcutaneous Q8H Bousman, Karlie, PA-C   5,000 Units at 05/04/24 1312   insulin  aspart (novoLOG ) injection 0-15 Units  0-15 Units Subcutaneous Q4H Aleskerov, Fuad, MD   11 Units at 05/04/24 1201   ipratropium-albuterol  (DUONEB) 0.5-2.5 (3) MG/3ML nebulizer solution 3 mL  3 mL Nebulization Q6H PRN Bousman, Karlie, PA-C       norepinephrine  (LEVOPHED ) 4mg  in (0.016 mg/mL) premix infusion  0-40 mcg/min Intravenous Continuous Ward, Kristen N, DO 37.5 mL/hr at 05/04/24 1328 10 mcg/min at 05/04/24 1328   piperacillin -tazobactam (ZOSYN ) IVPB 3.375 g  3.375 g Intravenous Q8H Steinbock,  Damien, [facility]   Stopped at 05/04/24 1141   polyethylene glycol (MIRALAX  / GLYCOLAX ) packet 17 g  17 g Oral Daily PRN Bousman, Karlie, PA-C       polyethylene glycol (MIRALAX  / GLYCOLAX ) packet 17 g  17 g Per Tube Daily Bousman, Karlie, PA-C   17 g at 05/04/24 0925   propofol  (DIPRIVAN ) 1000 MG/100ML infusion  0-80 mcg/kg/min Intravenous Continuous Ward, Kristen N, DO 5.3 mL/hr at 05/04/24 1358 8.729 mcg/kg/min at 05/04/24 1358   rocuronium  (ZEMURON ) injection   Intravenous Code/Trauma/Sedation Med Ward, Kristen N, DO   100 mg at 05/04/24 0335   senna (SENOKOT) tablet 8.6 mg  1 tablet Per Tube BID Bousman, Karlie, PA-C   8.6 mg at 05/04/24 9078   sodium bicarbonate  150 mEq in sterile water  1,150  mL infusion   Intravenous Continuous Bousman, Karlie, PA-C 200 mL/hr at 05/04/24 1356 New Bag at 05/04/24 1356   sodium bicarbonate  injection   Intravenous Code/Trauma/Sedation Med Ward, Kristen N, DO   150 mEq at 05/04/24 9666   vasopressin  (PITRESSIN) 20 Units in 100 mL (0.2 unit/mL) infusion-*FOR SHOCK*  0-0.03 Units/min Intravenous Continuous Ward, Josette SAILOR, DO   Held at 05/04/24 9252   Current Outpatient Medications  Medication Sig Dispense Refill   aspirin  81 MG EC tablet TAKE ONE TABLET BY MOUTH EVERY DAY (Patient taking differently: 325 mg daily.) 90 tablet 0   clonazePAM  (KLONOPIN ) 1 MG tablet Take 1 tablet (1 mg total) by mouth 3 (three) times daily. 90 tablet 1   clopidogrel  (PLAVIX ) 75 MG tablet Take 1 tablet (75 mg total) by mouth daily. 90 tablet 0   doxepin  (SINEQUAN ) 25 MG capsule Take 1-2 capsules (25-50 mg total) by mouth at bedtime. 60 capsule 2   gabapentin  (NEURONTIN ) 300 MG capsule Take 1 capsule (300 mg total) by mouth 3 (three) times daily. 180 capsule 5   gabapentin  (NEURONTIN ) 300 MG capsule Take 2 capsules (600 mg total) by mouth 3 (three) times daily. 180 capsule 5   hydrOXYzine  (VISTARIL ) 25 MG capsule Take 1 capsule (25 mg total) by mouth 2 (two) times daily. 30 capsule 1   hydrOXYzine  (VISTARIL ) 25 MG capsule Take 1 capsule (25 mg total) by mouth 2 (two) times daily. 30 capsule 1   lidocaine  (LIDODERM ) 5 % Place 1 patch onto the skin daily Apply patch to the most painful area for up to 12 hours in a 24 hour period. (Patient taking differently: Place 1 patch onto the skin as directed. PRN) 30 patch 3   losartan  (COZAAR ) 100 MG tablet Take 1 tablet (100 mg total) by mouth daily. 90 tablet 0   losartan  (COZAAR ) 100 MG tablet Take 1 tablet (100 mg total) by mouth daily. 90 tablet 0   metFORMIN  (GLUCOPHAGE ) 500 MG tablet Take 1 tablet (500 mg total) by mouth daily with breakfast. 90 tablet 1   metoprolol  tartrate (LOPRESSOR ) 25 MG tablet Take 1 tablet (25 mg total) by  mouth 2 (two) times daily. 180 tablet 0   QUEtiapine  (SEROQUEL ) 400 MG tablet Take 1 tablet (400 mg total) by mouth at bedtime. 30 tablet 3   QUEtiapine  (SEROQUEL ) 400 MG tablet Take 1 tablet (400 mg total) by mouth at bedtime 30 tablet 2   rosuvastatin  (CRESTOR ) 20 MG tablet Take 1 tablet (20 mg total) by mouth daily. 90 tablet 1   tirzepatide  (MOUNJARO ) 12.5 MG/0.5ML Pen Inject 12.5 mg into the skin once a week. 2 mL 5   traMADol  (ULTRAM ) 50 MG  tablet Take 1 tablet (50 mg total) by mouth 2 (two) times daily as needed. 60 tablet 3   traZODone  (DESYREL ) 150 MG tablet Take 1 tablet (150 mg total) by mouth at bedtime. 90 tablet 1   umeclidinium-vilanterol (ANORO ELLIPTA ) 62.5-25 MCG/ACT AEPB Inhale 1 puff into the lungs daily. 60 each 12   QUEtiapine  (SEROQUEL ) 200 MG tablet Take 1 tablet (200 mg total) by mouth at bedtime as needed for insomnia. (Patient not taking: Reported on 05/04/2024) 30 tablet 2   tirzepatide  (MOUNJARO ) 10 MG/0.5ML Pen Inject 10 mg into the skin once a week. (Patient not taking: Reported on 05/04/2024) 2 mL 3   tirzepatide  (MOUNJARO ) 10 MG/0.5ML Pen Inject 10 mg into the skin once a week. (Patient not taking: Reported on 05/04/2024) 2 mL 3   traZODone  (DESYREL ) 150 MG tablet Take 1 tablet (150 mg total) by mouth at bedtime. (Patient not taking: Reported on 05/04/2024) 90 tablet 1      Allergies: Allergies[1]    Past Medical History: Past Medical History:  Diagnosis Date   Alcohol abuse    Recovered x 15 months   Anxiety    CA - skin cancer    CAD, residual CFX LAD disease 06/01/2011   a. s/p inferior ST elevation MI s/p PCI/DES to RCA on 05/29/2011; b. residual LAD and LCx CAD tx'd on 06/02/11 cath with LAD CAD successfully tx'd w/ PCI/DES, LCx managaged medically    Depression    Headache(784.0)    Hyperlipemia    Hypertension    Insomnia    Mental disorder    MI (myocardial infarction) (HCC)    Polysubstance abuse (HCC)    a. etoh, xanax , tobacco   PTSD  (post-traumatic stress disorder)      Past Surgical History: Past Surgical History:  Procedure Laterality Date   CARDIAC CATHETERIZATION Bilateral 04/09/2015   Procedure: Coronary Angiogram;  Surgeon: Deatrice DELENA Cage, MD;  Location: [facility] INVASIVE CV LAB;  Service: Cardiovascular;  Laterality: Bilateral;   CARDIAC CATHETERIZATION [name] 04/09/2015   Procedure: Coronary Stent Intervention;  Surgeon: Deatrice DELENA Cage, MD;  Location: [facility] INVASIVE CV LAB;  Service: Cardiovascular;  Laterality: [name];   COLONOSCOPY WITH PROPOFOL  [name] 03/19/2019   Procedure: COLONOSCOPY WITH PROPOFOL ;  Surgeon: Unk Corinn Skiff, MD;  Location: [facility] ENDOSCOPY;  Service: Gastroenterology;  Laterality: [name];   LEFT HEART CATH Right 05/29/2011   Procedure: LEFT HEART CATH;  Surgeon: Alm LELON Clay, MD;  Location[facility] CATH LAB;  Service: Cardiovascular;  Laterality: Right;   LEFT HEART CATHETERIZATION WITH CORONARY ANGIOGRAM [name] 06/01/2011   Procedure: LEFT HEART CATHETERIZATION WITH CORONARY ANGIOGRAM;  Surgeon: Alm LELON Clay, MD;  Location: [facility] CATH LAB;  Service: Cardiovascular;  Laterality: [name];  relook cath, possible PCI   TONSILLECTOMY AND ADENOIDECTOMY       Family History: Family History  Problem Relation Age of Onset   Chronic Renal Failure Mother    Cancer Mother        Non-Hodgkin lymphoma   Depression Mother    Anxiety disorder Mother    Depression Father    Anxiety disorder Father    Other Brother        MVA   Coronary artery disease Maternal Grandfather 52       Died     Social History: Social History   Socioeconomic History   Marital status: Single    Spouse name: Not on file   Number of children: 0   Years of education: Not on file  Highest education level: Not on file  Occupational History   Occupation: Field Seismologist: [employer]  Tobacco Use   Smoking status: Every Day    Current packs/day: 1.50    Average packs/day: 1.5 packs/day for 30.0 years (45.0 ttl  pk-yrs)    Types: Cigarettes   Smokeless tobacco: Never   Tobacco comments:    Smokes 0.75 PPD- khj 01/17/2024  Vaping Use   Vaping status: Never Used  Substance and Sexual Activity   Alcohol use: No    Comment: Previous user (sober x 5 years)   Drug use: No   Sexual activity: Not Currently  Other Topics Concern   Not on file  Social History Narrative   Not on file   Social Drivers of Health   Tobacco Use: High Risk (05/04/2024)   Patient History    Smoking Tobacco Use: Every Day    Smokeless Tobacco Use: Never    Passive Exposure: Not on file  Financial Resource Strain: Low Risk  (01/11/2024)   Received from [facility]   Overall Financial Resource Strain (CARDIA)    Difficulty of Paying Living Expenses: Not hard at all  Food Insecurity: No Food Insecurity (01/11/2024)   Received from [facility]   Epic    Within the past 12 months, you worried that your food would run out before you got the money to buy more.: Never true    Within the past 12 months, the food you bought just didn't last and you didn't have money to get more.: Never true  Recent Concern: Food Insecurity - Food Insecurity Present (11/02/2023)   Received from [facility]   Epic    Within the past 12 months, you worried that your food would run out before you got the money to buy more.: Often true    Within the past 12 months, the food you bought just didn't last and you didn't have money to get more.: Sometimes true  Transportation Needs: No Transportation Needs (01/11/2024)   Received from [facility] - Transportation    In the past 12 months, has lack of transportation kept you from medical appointments or from getting medications?: No    Lack of Transportation (Non-Medical): No  Recent Concern: Transportation Needs - Unmet Transportation Needs (11/02/2023)   Received from [facility] - Transportation     In the past 12 months, has lack of transportation kept you from medical appointments or from getting medications?: No    Lack of Transportation (Non-Medical): Yes  Physical Activity: Sufficiently Active (02/11/2022)   Exercise Vital Sign    Days of Exercise per Week: 7 days    Minutes of Exercise per Session: 60 min  Stress: Stress Concern Present (02/11/2022)   [employer] of Occupational Health - Occupational Stress Questionnaire    Feeling of Stress : To some extent  Social Connections: Moderately Isolated (02/11/2022)   Social Connection and Isolation Panel    Frequency of Communication with Friends and Family: More than three times a week    Frequency of Social Gatherings with Friends and Family: Once a week    Attends Religious Services: 1 to 4 times per year    Active Member of [employer] or Organizations: No    Attends Banker Meetings: Never    Marital Status: Never married  Intimate Partner Violence: Not At Risk (02/11/2022)   Humiliation, Afraid,  Rape, and Kick questionnaire    Fear of Current or Ex-Partner: No    Emotionally Abused: No    Physically Abused: No    Sexually Abused: No  Depression (PHQ2-9): Medium Risk (02/11/2022)   Depression (PHQ2-9)    PHQ-2 Score: 7  Alcohol Screen: Low Risk (02/11/2022)   Alcohol Screen    Last Alcohol Screening Score (AUDIT): 0  Housing: Low Risk  (01/11/2024)   Received from [facility]   Epic    In the last 12 months, was there a time when you were not able to pay the mortgage or rent on time?: No    In the past 12 months, how many times have you moved where you were living?: 0    At any time in the past 12 months, were you homeless or living in a shelter (including now)?: No  Utilities: Not At Risk (01/11/2024)   Received from [facility]   Epic    In the past 12 months has the electric, gas, oil, or water  company threatened to shut off services in your home?: No  Health  Literacy: Not on file     Review of Systems: Review of Systems  Unable to perform ROS: Intubated    Vital Signs: Blood pressure 104/65, pulse (!) 105, temperature 100 F (37.8 C), resp. rate 20, height 5' 8 (1.727 m), weight 101.2 kg, SpO2 97%.  Weight trends: Filed Weights   05/04/24 0326  Weight: 101.2 kg    Physical Exam: General: Ill-appearing  Head: Normocephalic, atraumatic.   Eyes: Anicteric  Lungs:  Intubated on vent  Heart: Regular rate and rhythm  Abdomen:  Soft, nontender,   Extremities: No peripheral edema.  Neurologic: Sedated  Skin: No lesions  Access: None     Lab results: Basic Metabolic Panel: Recent Labs  Lab 05/04/24 0318 05/04/24 0518  NA 132* 133*  K 4.6 5.4*  CL 94* 96*  CO2 10* 11*  GLUCOSE 260* 316*  BUN 37* 35*  CREATININE 3.53* 2.88*  CALCIUM  8.3* 7.5*  MG 3.1* 3.4*  PHOS  --  8.2*    Liver Function Tests: Recent Labs  Lab 05/04/24 0318  AST 3,895*  ALT 2,503*  ALKPHOS 128*  BILITOT 1.0  PROT 5.9*  ALBUMIN 3.4*   No results for input(s): LIPASE, AMYLASE in the last 168 hours. Recent Labs  Lab 05/04/24 0318  AMMONIA 40*    CBC: Recent Labs  Lab 05/04/24 0318 05/04/24 0518  WBC 9.3 8.3  NEUTROABS 7.3  --   HGB 12.7* 13.2  HCT 40.8 41.8  MCV 102.3* 101.0*  PLT 103* 81*    Cardiac Enzymes: Recent Labs  Lab 05/04/24 0318 05/04/24 0938  CKTOTAL 411* 677*    BNP: Invalid input(s): POCBNP  CBG: Recent Labs  Lab 05/04/24 0315 05/04/24 0751 05/04/24 1010 05/04/24 1157  GLUCAP 103* 308* 317* 334*    Microbiology: Results for orders placed or performed during the hospital encounter of 05/04/24  Blood Culture (routine x 2)     Status: None (Preliminary result)   Collection Time: 05/04/24  3:18 AM   Specimen: BLOOD  Result Value Ref Range Status   Specimen Description BLOOD BLOOD RIGHT ARM  Final   Special Requests   Final    BOTTLES DRAWN AEROBIC AND ANAEROBIC Blood Culture adequate  volume   Culture   Final    NO GROWTH < 12 HOURS Performed at [facility] [address] [place],  [place] [address]    Report Status PENDING  Incomplete  Resp panel by RT-PCR (RSV, Flu A&B, Covid) Anterior Nasal Swab     Status: None   Collection Time: 05/04/24  3:22 AM   Specimen: Anterior Nasal Swab  Result Value Ref Range Status   SARS Coronavirus 2 by RT PCR NEGATIVE NEGATIVE Final    Comment: (NOTE) SARS-CoV-2 target nucleic acids are NOT DETECTED.  The SARS-CoV-2 RNA is generally detectable in upper respiratory specimens during the acute phase of infection. The lowest concentration of SARS-CoV-2 viral copies this assay can detect is 138 copies/mL. A negative result does not preclude SARS-Cov-2 infection and should not be used as the sole basis for treatment or other patient management decisions. A negative result may occur with  improper specimen collection/handling, submission of specimen other than nasopharyngeal swab, presence of viral mutation(s) within the areas targeted by this assay, and inadequate number of viral copies(<138 copies/mL). A negative result must be combined with clinical observations, patient history, and epidemiological information. The expected result is Negative.  Fact Sheet for Patients:  bloggercourse.com  Fact Sheet for Healthcare Providers:  seriousbroker.it  This test is no t yet approved or cleared by the [place]  FDA and  has been authorized for detection and/or diagnosis of SARS-CoV-2 by FDA under an Emergency Use Authorization (EUA). This EUA will remain  in effect (meaning this test can be used) for the duration of the COVID-19 declaration under Section 564(b)(1) of the Act, 21 U.S.C.section 360bbb-3(b)(1), unless the authorization is terminated  or revoked sooner.       Influenza A by PCR NEGATIVE NEGATIVE Final   Influenza B by PCR NEGATIVE NEGATIVE Final     Comment: (NOTE) The Xpert Xpress SARS-CoV-2/FLU/RSV plus assay is intended as an aid in the diagnosis of influenza from Nasopharyngeal swab specimens and should not be used as a sole basis for treatment. Nasal washings and aspirates are unacceptable for Xpert Xpress SARS-CoV-2/FLU/RSV testing.  Fact Sheet for Patients: bloggercourse.com  Fact Sheet for Healthcare Providers: seriousbroker.it  This test is not yet approved or cleared by the [place]  FDA and has been authorized for detection and/or diagnosis of SARS-CoV-2 by FDA under an Emergency Use Authorization (EUA). This EUA will remain in effect (meaning this test can be used) for the duration of the COVID-19 declaration under Section 564(b)(1) of the Act, 21 U.S.C. section 360bbb-3(b)(1), unless the authorization is terminated or revoked.     Resp Syncytial Virus by PCR NEGATIVE NEGATIVE Final    Comment: (NOTE) Fact Sheet for Patients: bloggercourse.com  Fact Sheet for Healthcare Providers: seriousbroker.it  This test is not yet approved or cleared by the [place]  FDA and has been authorized for detection and/or diagnosis of SARS-CoV-2 by FDA under an Emergency Use Authorization (EUA). This EUA will remain in effect (meaning this test can be used) for the duration of the COVID-19 declaration under Section 564(b)(1) of the Act, 21 U.S.C. section 360bbb-3(b)(1), unless the authorization is terminated or revoked.  Performed at [facility], [address] [place], [place] [address]   MRSA Next Gen by PCR, Nasal     Status: None   Collection Time: 05/04/24  9:38 AM   Specimen: Nasal Mucosa; Nasal Swab  Result Value Ref Range Status   MRSA by PCR Next Gen NOT DETECTED NOT DETECTED Final    Comment: (NOTE) The GeneXpert MRSA Assay (FDA approved for NASAL specimens only), is one component of a  comprehensive MRSA colonization surveillance  program. It is not intended to diagnose MRSA infection nor to guide or monitor treatment for MRSA infections. Test performance is not FDA approved in patients less than 47 years old. Performed at [facility], [address] [place], [place] [address]     Coagulation Studies: Recent Labs    05/04/24 0433  LABPROT 36.7*  INR 3.5*    Urinalysis: Recent Labs    05/04/24 0322  COLORURINE AMBER*  LABSPEC 1.021  PHURINE 5.0  GLUCOSEU NEGATIVE  HGBUR SMALL*  BILIRUBINUR NEGATIVE  KETONESUR NEGATIVE  PROTEINUR 30*  NITRITE NEGATIVE  LEUKOCYTESUR NEGATIVE      Imaging: CT CERVICAL SPINE WO CONTRAST Result Date: 05/04/2024 EXAM: CT CERVICAL SPINE WITHOUT CONTRAST 05/04/2024 05:08:55 AM TECHNIQUE: CT of the cervical spine was performed without the administration of intravenous contrast. Multiplanar reformatted images are provided for review. Automated exposure control, iterative reconstruction, and/or weight based adjustment of the mA/kV was utilized to reduce the radiation dose to as low as reasonably achievable. COMPARISON: None available. CLINICAL HISTORY: Polytrauma, blunt. FINDINGS: BONES AND ALIGNMENT: There is no evidence of fracture or acute traumatic injury. DEGENERATIVE CHANGES: There is mild diffuse degenerative disc disease throughout the cervical spine. SOFT TISSUES: Endotracheal and orogastric tubes are present. No prevertebral soft tissue swelling. IMPRESSION: 1. No evidence of fracture or acute traumatic injury. 2. Mild diffuse degenerative disc disease throughout the cervical spine. Electronically signed by: Evalene Coho MD 05/04/2024 05:52 AM EST RP Workstation: [id number]   CT CHEST ABDOMEN PELVIS WO CONTRAST Result Date: 05/04/2024 EXAM: CT CHEST, ABDOMEN AND PELVIS WITHOUT CONTRAST 05/04/2024 05:08:55 AM TECHNIQUE: CT of the chest, abdomen and pelvis was performed without the administration of intravenous  contrast. Multiplanar reformatted images are provided for review. Automated exposure control, iterative reconstruction, and/or weight based adjustment of the mA/kV was utilized to reduce the radiation dose to as low as reasonably achievable. COMPARISON: CT angiogram of the chest dated 04/19/2015. CLINICAL HISTORY: Polytrauma, blunt. FINDINGS: CHEST: MEDIASTINUM AND LYMPH NODES: Heart is enlarged. Moderate calcific coronary artery disease present. Endotracheal tube is present with its tip in the proximal right mainstem bronchus. The central airways are clear otherwise. No mediastinal, hilar or axillary lymphadenopathy. LUNGS AND PLEURA: Streaky consolidation/atelectasis present independently within the lower lobes bilaterally. Scattered patchy and reticular opacities within the right upper lobe. No pleural effusion. No pneumothorax. ABDOMEN AND PELVIS: LIVER: Fatty infiltration of the liver. GALLBLADDER AND BILE DUCTS: Unremarkable. No biliary ductal dilatation. SPLEEN: No acute abnormality. PANCREAS: No acute abnormality. ADRENAL GLANDS: No acute abnormality. KIDNEYS, URETERS AND BLADDER: Small exophytic cysts arising laterally from the right kidney. Per consensus, no follow-up is needed for simple Bosniak type 1 and 2 renal cysts, unless the patient has a malignancy history or risk factors. No stones in the kidneys or ureters. No hydronephrosis. No perinephric or periureteral stranding. A Foley catheter is present within the urinary bladder. GI AND BOWEL: Nasogastric tube is present with its tip in the distal stomach. The bowel is unremarkable. There is no bowel obstruction. REPRODUCTIVE ORGANS: No acute abnormality. PERITONEUM AND RETROPERITONEUM: No ascites. No free air. VASCULATURE: Aorta is normal in caliber and demonstrates moderate calcific atheromatous disease. ABDOMINAL AND PELVIS LYMPH NODES: No lymphadenopathy. REPRODUCTIVE ORGANS: No acute abnormality. BONES AND SOFT TISSUES: Healing or healed fractures  of the right anterior and lateral fourth through ninth ribs. Mild-to-moderate chronic compression deformity of L1. Bilateral chronic spondylolysis of L5. No focal soft tissue abnormality. IMPRESSION: 1. Endotracheal tube tip in the proximal right mainstem bronchus;  recommend retraction/repositioning. 2. Streaky dependent lower lobe atelectasis/consolidation bilaterally with scattered patchy reticular opacities in the right upper lobe. 3. Healing/healed fractures of the right anterior/lateral 4th through 9th ribs; no evidence of acute traumatic injury. 4. Cardiomegaly with moderate calcific coronary artery disease. 5. Hepatic steatosis and small exophytic right renal cysts. 6. Chronic mild-to-moderate L1 compression deformity and chronic bilateral L5 spondylolysis. Electronically signed by: Evalene Coho MD 05/04/2024 05:51 AM EST RP Workstation: [id number]   CT HEAD WO CONTRAST Result Date: 05/04/2024 EXAM: CT HEAD WITHOUT CONTRAST 05/04/2024 05:08:55 AM TECHNIQUE: CT of the head was performed without the administration of intravenous contrast. Automated exposure control, iterative reconstruction, and/or weight based adjustment of the mA/kV was utilized to reduce the radiation dose to as low as reasonably achievable. COMPARISON: CT of the head dated 04/05/2015. CLINICAL HISTORY: Head trauma, moderate-severe. FINDINGS: BRAIN AND VENTRICLES: No acute hemorrhage. No evidence of acute infarct. Patchy and confluent decreased attenuation throughout bilateral cerebral hemispheres deep and periventricular white matter, compatible with chronic microvascular ischemic disease. Chronic left thalamic lacunar infarctions. No hydrocephalus. No extra-axial collection. No mass effect or midline shift. Atherosclerotic calcifications within the cavernous internal carotid and vertebral arteries. ORBITS: No acute abnormality. SINUSES: No acute abnormality. SOFT TISSUES AND SKULL: No acute soft tissue abnormality. No skull  fracture. IMPRESSION: 1. No acute intracranial abnormality. 2. Chronic microvascular ischemic disease in bilateral cerebral hemispheres deep and periventricular white matter. 3. Chronic left thalamic lacunar infarctions. 4. Atherosclerotic calcifications within the cavernous internal carotid and vertebral arteries. Electronically signed by: Evalene Coho MD 05/04/2024 05:15 AM EST RP Workstation: [id number]   DG Abdomen 1 View Result Date: 05/04/2024 EXAM: 1 VIEW XRAY OF THE ABDOMEN 05/04/2024 04:07:00 AM COMPARISON: None available. CLINICAL HISTORY: OG placement OG placement FINDINGS: LINES, TUBES AND DEVICES: NGT is in place with the tip in the body of the stomach. BOWEL: Nonobstructive bowel gas pattern. There is moderate retained stool in the transverse colon. No dilated bowel is seen in the abdomen, with the pelvis not included in the image. There is no supine evidence of free air. SOFT TISSUES: There is abdominal aortic atherosclerosis. BONES: No acute fracture. LUNG BASES: Patchy consolidation is noted in both lung bases. IMPRESSION: 1. Enteric tube tip in the stomach. 2. Patchy consolidation in both lung bases. Electronically signed by: Francis Quam MD 05/04/2024 04:21 AM EST RP Workstation: [id number]   DG Chest Port 1 View Result Date: 05/04/2024 EXAM: 1 VIEW XRAY OF THE CHEST 05/04/2024 04:07:00 AM COMPARISON: CTA chest 04/19/2015. CLINICAL HISTORY: Questionable sepsis - evaluate for abnormality. FINDINGS: LINES, TUBES AND DEVICES: ETT is in place with the tip 2.4 cm from the carina. NGT is well within the stomach, the tip probably in the distal gastric body based on the position of the side hole. Multiple overlying monitor leads. LUNGS AND PLEURA: Patchy airspace consolidation in the base of both lungs consistent with pneumonia or aspiration. Mid and upper lungs remain clear. No pleural effusion. No pneumothorax. HEART AND MEDIASTINUM: Mild cardiomegaly. No findings of acute CHF. The  mediastinum is normally outlined with mild aortic atherosclerosis. BONES AND SOFT TISSUES: Healed fracture deformities of the right rib cage. Mild thoracic dextroscoliosis. IMPRESSION: 1. Patchy airspace consolidation in the base of both lungs, consistent with pneumonia or aspiration. Mid and upper lungs remain clear. 2. Endotracheal tube and nasogastric tube are in satisfactory position. Electronically signed by: Francis Quam MD 05/04/2024 04:17 AM EST RP Workstation: [id number]     Assessment & Plan: Mr. Larry French  is a 64 y.o.  male with past medical history including CAD, hypertension, PTSD, insomnia, anxiety, and polysubstance abuse, who was admitted to [facility] on 05/04/2024 for Acute respiratory failure (HCC) [J96.00]  Acute kidney injury secondary to circulatory shock versus sepsis.. Baseline creatinine 0.9 with GFR 97 on 01/03/24.  Further workup in progress.  CT abd pelvis shows simple cysts. No acute indication for dialysis at this time. Adequate supportive measures in place. Low threshold for dialysis.   2.  Acute metabolic acidosis, serum bicarb 10 on ED arrival.  Patient currently receiving sodium bicarb infusion.  3.  Acute respiratory failure requiring mechanical ventilation.  Community-acquired pneumonia versus aspiration pneumonia.  Vent support management per critical care team.  4.  Hypotension with history of hypertension.  Currently requiring Levophed  as blood pressure support.   LOS: 0 Kerby Hockley 1/16/20262:22 PM     [1] No Known Allergies  "

## 2024-05-04 NOTE — ED Notes (Signed)
 All lines checked for patency and rates verified by this RN.

## 2024-05-04 NOTE — Plan of Care (Signed)
 Patient's father, Gaither, listed in chart, called for an update. Patient's cousin, [name], listed in chart, found only empty cans of beer where patient resides. Per father, the patient drinks on and off but he is unsure of when patient's last drink was. Patient's father given update, all questions answered at this time.   He will call tomorrow for an update.  Problem: Elimination: Goal: Will not experience complications related to bowel motility Outcome: Progressing

## 2024-05-04 NOTE — ED Notes (Signed)
 Pt taken to CT with Therisa PEAK and RT Bernarda

## 2024-05-04 NOTE — Progress Notes (Signed)
 ETT has been pulled back from 26cm to 24cm per MD orders. Pt sat at 98%. Pt in no distress to be reported. RT will cont to monitor.

## 2024-05-04 NOTE — Consult Note (Addendum)
 Pharmacy Consult Note - Electrolytes  Sodium (mmol/L)  Date Value  05/04/2024 132 (L)  02/11/2022 136  03/07/2014 143   Potassium (mmol/L)  Date Value  05/04/2024 4.6  03/07/2014 3.2 (L)   Magnesium  (mg/dL)  Date Value  98/83/7973 3.1 (H)  03/04/2014 2.3   Calcium  (mg/dL)  Date Value  98/83/7973 8.3 (L)   Calcium , Total (mg/dL)  Date Value  88/80/7984 7.4 (L)   Albumin (g/dL)  Date Value  98/83/7973 3.4 (L)  10/22/2020 4.2  03/07/2014 3.4   Corrected Ca: 7.9 mg/dL  ASSESSMENT: 64 y.o. male with PMH including CAD/STEMI s/p DES (2012), alcohol abuse presents with hypothermia after being found unresponsive outside in cold. Pharmacy has been consulted to monitor and replace electrolytes. Patient's renal function is currently poor because he is in AKI.  Lab Results  Component Value Date   CREATININE 3.53 (H) 05/04/2024   CREATININE 0.85 02/11/2022   CREATININE 1.12 10/22/2020    mIVF: NS @ 150 mL/hr  Pertinent medications: n/a  Goal of Therapy: Electrolytes WNL  PLAN: No electrolyte replacement currently warranted Check electrolytes with next AM labs  Thank you for allowing pharmacy to be a part of this patients care.  Will M. Lenon, PharmD, BCPS Clinical Pharmacist 05/04/2024 7:14 AM

## 2024-05-04 NOTE — ED Provider Notes (Signed)
 "  [facility] Provider Note    Event Date/Time   First MD Initiated Contact with Patient 05/04/24 0303     (approximate)   History   Altered Mental Status and Cold Exposure   HPI  SAHITH NURSE is a 64 y.o. male with history of COPD, alcohol and benzodiazepine abuse, hypertension, hyperlipidemia, CAD who presents to the emergency department after he was found down outside of his father's house with altered mental status.  EMS reports they had an axillary temperature of 88.  They were unable to get an oxygen saturation or blood pressure.  Blood glucose was in the low 100s.  Patient is awake and breathing on his own but not answering questions or following commands.  Intermittently agitated.  They report he was found outside naked.  Unclear how long he was outside.  Temperatures currently are in the 7s F locally.  They report that his father told him that if he wanted to come inside he would have to crawl inside.  Patient is covered in diffuse abrasions from appearing to have tried to crawl.  EMS reports that he never made it inside of the house.  Father called family who then called 911.  History provided by EMS.    Past Medical History:  Diagnosis Date   Alcohol abuse    Recovered x 15 months   Anxiety    CA - skin cancer    CAD, residual CFX LAD disease 06/01/2011   a. s/p inferior ST elevation MI s/p PCI/DES to RCA on 05/29/2011; b. residual LAD and LCx CAD tx'd on 06/02/11 cath with LAD CAD successfully tx'd w/ PCI/DES, LCx managaged medically    Depression    Headache(784.0)    Hyperlipemia    Hypertension    Insomnia    Mental disorder    MI (myocardial infarction) (HCC)    Polysubstance abuse (HCC)    a. etoh, xanax , tobacco   PTSD (post-traumatic stress disorder)     Past Surgical History:  Procedure Laterality Date   CARDIAC CATHETERIZATION Bilateral 04/09/2015   Procedure: Coronary Angiogram;  Surgeon: Deatrice DELENA Cage, MD;   Location: [facility] INVASIVE CV LAB;  Service: Cardiovascular;  Laterality: Bilateral;   CARDIAC CATHETERIZATION [name] 04/09/2015   Procedure: Coronary Stent Intervention;  Surgeon: Deatrice DELENA Cage, MD;  Location: [facility] INVASIVE CV LAB;  Service: Cardiovascular;  Laterality: [name];   COLONOSCOPY WITH PROPOFOL  [name] 03/19/2019   Procedure: COLONOSCOPY WITH PROPOFOL ;  Surgeon: Unk Corinn Skiff, MD;  Location: [facility] ENDOSCOPY;  Service: Gastroenterology;  Laterality: [name];   LEFT HEART CATH Right 05/29/2011   Procedure: LEFT HEART CATH;  Surgeon: Alm LELON Clay, MD;  Location: [facility] CATH LAB;  Service: Cardiovascular;  Laterality: Right;   LEFT HEART CATHETERIZATION WITH CORONARY ANGIOGRAM [name] 06/01/2011   Procedure: LEFT HEART CATHETERIZATION WITH CORONARY ANGIOGRAM;  Surgeon: Alm LELON Clay, MD;  Location: [facility] CATH LAB;  Service: Cardiovascular;  Laterality: [name];  relook cath, possible PCI   TONSILLECTOMY AND ADENOIDECTOMY      MEDICATIONS:  Prior to Admission medications  Medication Sig Start Date End Date Taking? Authorizing Provider  aspirin  81 MG EC tablet TAKE ONE TABLET BY MOUTH EVERY DAY 01/09/16   McGowan, Clotilda A, PA-C  clonazePAM  (KLONOPIN ) 1 MG tablet Take 1 tablet (1 mg total) by mouth 3 (three) times daily. 04/23/24     clopidogrel  (PLAVIX ) 75 MG tablet Take 1 tablet (75 mg total) by mouth daily. 05/23/23     doxepin  (SINEQUAN )  25 MG capsule Take 1-2 capsules (25-50 mg total) by mouth at bedtime. 03/26/24     gabapentin  (NEURONTIN ) 300 MG capsule Take 1 capsule (300 mg total) by mouth 3 (three) times daily. 02/14/24     gabapentin  (NEURONTIN ) 300 MG capsule Take 2 capsules (600 mg total) by mouth 3 (three) times daily. 04/05/24     hydrOXYzine  (VISTARIL ) 25 MG capsule Take 1 capsule (25 mg total) by mouth 2 (two) times daily. 03/12/24     hydrOXYzine  (VISTARIL ) 25 MG capsule Take 1 capsule (25 mg total) by mouth 2 (two) times daily. 04/23/24     lidocaine  (LIDODERM ) 5 % Place 1 patch onto the skin  daily Apply patch to the most painful area for up to 12 hours in a 24 hour period. Patient taking differently: Place 1 patch onto the skin as directed. PRN 01/17/24     losartan  (COZAAR ) 100 MG tablet Take 1 tablet (100 mg total) by mouth daily. 10/17/23     losartan  (COZAAR ) 100 MG tablet Take 1 tablet (100 mg total) by mouth daily. 03/12/24     metFORMIN  (GLUCOPHAGE ) 500 MG tablet Take 1 tablet (500 mg total) by mouth daily with breakfast. 01/11/24     metoprolol  tartrate (LOPRESSOR ) 25 MG tablet Take 1 tablet (25 mg total) by mouth 2 (two) times daily. 05/23/23     QUEtiapine  (SEROQUEL ) 200 MG tablet Take 1 tablet (200 mg total) by mouth at bedtime as needed for insomnia. 01/18/24     QUEtiapine  (SEROQUEL ) 400 MG tablet Take 1 tablet (400 mg total) by mouth at bedtime. 02/01/24     QUEtiapine  (SEROQUEL ) 400 MG tablet Take 1 tablet (400 mg total) by mouth at bedtime 03/01/24     rosuvastatin  (CRESTOR ) 20 MG tablet Take 1 tablet (20 mg total) by mouth daily. 01/16/24     tirzepatide  (MOUNJARO ) 10 MG/0.5ML Pen Inject 10 mg into the skin once a week. 07/07/23     tirzepatide  (MOUNJARO ) 10 MG/0.5ML Pen Inject 10 mg into the skin once a week. 08/01/23     tirzepatide  (MOUNJARO ) 12.5 MG/0.5ML Pen Inject 12.5 mg into the skin once a week. 10/24/23     traMADol  (ULTRAM ) 50 MG tablet Take 1 tablet (50 mg total) by mouth 2 (two) times daily as needed. 12/14/23     traZODone  (DESYREL ) 150 MG tablet Take 1 tablet (150 mg total) by mouth at bedtime. 01/11/24     traZODone  (DESYREL ) 150 MG tablet Take 1 tablet (150 mg total) by mouth at bedtime. 03/12/24     umeclidinium-vilanterol (ANORO ELLIPTA ) 62.5-25 MCG/ACT AEPB Inhale 1 puff into the lungs daily. 02/08/24   Isadora Hose, MD  ARIPiprazole  (ABILIFY ) 2 MG tablet Take 1 tablet (2 mg total) by mouth once daily for 120 days 09/01/23 12/08/23      Physical Exam   Triage Vital Signs: ED Triage Vitals  Encounter Vitals Group     BP 05/04/24 0338 (!) 61/45     Girls  Systolic BP Percentile --      Girls Diastolic BP Percentile --      Boys Systolic BP Percentile --      Boys Diastolic BP Percentile --      Pulse Rate 05/04/24 0345 82     Resp 05/04/24 0338 16     Temp 05/04/24 0338 (!) 85.4 F (29.7 C)     Temp src --      SpO2 05/04/24 0345 (!) 78 %     Weight 05/04/24  0326 223 lb 1.7 oz (101.2 kg)     Height 05/04/24 0340 5' 8 (1.727 m)     Head Circumference --      Peak Flow --      Pain Score --      Pain Loc --      Pain Education --      Exclude from Growth Chart --       Most recent vital signs: Vitals:   05/04/24 0703 05/04/24 0715  BP:  128/61  Pulse:  87  Resp:  (!) 28  Temp:  (!) 88.6 F (31.4 C)  SpO2: 98% 99%     CONSTITUTIONAL: Patient is awake with his eyes open.  He will not answer questions or follow commands but he is moving all 4 extremities spontaneously. HEAD: Normocephalic; atraumatic EYES: Conjunctivae clear, PERRL, EOMI ENT: normal nose; no rhinorrhea; moist mucous membranes; pharynx without lesions noted; no dental injury; no septal hematoma, no epistaxis; no facial deformity or bony tenderness NECK: Supple, no midline spinal tenderness, step-off or deformity; trachea midline CARD: Irregularly irregular, ectopy noted on cardiac monitoring; S1 and S2 appreciated; no murmurs, no clicks, no rubs, no gallops RESP: Normal chest excursion without splinting or tachypnea; breath sounds clear and equal bilaterally; no wheezes, no rhonchi, no rales; unable to pick up oxygen saturation due to patient being extremely cold CHEST:  chest wall stable, no crepitus or ecchymosis or deformity, nontender to palpation; no flail chest, multiple areas of abrasions to the chest wall with purpleish discoloration and mottling ABD/GI: Mildly distended, umbilical hernia that is reducible, mottled appearing skin that is cool to touch and scattered abrasions, abdomen is soft PELVIS:  stable, no leg length discrepancy BACK: Multiple  abrasions to the back EXT: Multiple abrasions to his extremities, no obvious bony deformity, compartments soft, peripheral pulses present. SKIN: Patient extremely cool to touch, mottled in appearance. NEURO: Eyes open, moving all extremities but does not answer questions or follow commands.  ED Results / Procedures / Treatments   LABS: (all labs ordered are listed, but only abnormal results are displayed) Labs Reviewed  LACTIC ACID, PLASMA - Abnormal; Notable for the following components:      Result Value   Lactic Acid, Venous 3.7 (*)    All other components within normal limits  LACTIC ACID, PLASMA - Abnormal; Notable for the following components:   Lactic Acid, Venous >9.0 (*)    All other components within normal limits  COMPREHENSIVE METABOLIC PANEL WITH GFR - Abnormal; Notable for the following components:   Sodium 132 (*)    Chloride 94 (*)    CO2 10 (*)    Glucose, Bld 260 (*)    BUN 37 (*)    Creatinine, Ser 3.53 (*)    Calcium  8.3 (*)    Total Protein 5.9 (*)    Albumin 3.4 (*)    AST 3,895 (*)    ALT 2,503 (*)    Alkaline Phosphatase 128 (*)    GFR, Estimated 19 (*)    Anion gap 28 (*)    All other components within normal limits  CBC WITH DIFFERENTIAL/PLATELET - Abnormal; Notable for the following components:   RBC 3.99 (*)    Hemoglobin 12.7 (*)    MCV 102.3 (*)    RDW 16.1 (*)    Platelets 103 (*)    Abs Immature Granulocytes 0.19 (*)    All other components within normal limits  BLOOD GAS, VENOUS - Abnormal; Notable for the  following components:   pH, Ven <6.95 (*)    Bicarbonate 11.8 (*)    Acid-base deficit 20.9 (*)    All other components within normal limits  URINALYSIS, W/ REFLEX TO CULTURE (INFECTION SUSPECTED) - Abnormal; Notable for the following components:   Color, Urine AMBER (*)    APPearance CLOUDY (*)    Hgb urine dipstick SMALL (*)    Protein, ur 30 (*)    All other components within normal limits  MAGNESIUM  - Abnormal; Notable for the  following components:   Magnesium  3.1 (*)    All other components within normal limits  URINE DRUG SCREEN - Abnormal; Notable for the following components:   Benzodiazepines POSITIVE (*)    All other components within normal limits  CK - Abnormal; Notable for the following components:   Total CK 411 (*)    All other components within normal limits  ACETAMINOPHEN  LEVEL - Abnormal; Notable for the following components:   Acetaminophen  (Tylenol ), Serum <10 (*)    All other components within normal limits  AMMONIA - Abnormal; Notable for the following components:   Ammonia 40 (*)    All other components within normal limits  CBC - Abnormal; Notable for the following components:   RBC 4.14 (*)    MCV 101.0 (*)    RDW 16.3 (*)    Platelets 81 (*)    All other components within normal limits  PROTIME-INR - Abnormal; Notable for the following components:   Prothrombin Time 36.7 (*)    INR 3.5 (*)    All other components within normal limits  BLOOD GAS, ARTERIAL - Abnormal; Notable for the following components:   pH, Arterial 7.05 (*)    pO2, Arterial 200 (*)    Bicarbonate 11.9 (*)    Acid-base deficit 18.1 (*)    All other components within normal limits  CBG MONITORING, ED - Abnormal; Notable for the following components:   Glucose-Capillary 103 (*)    All other components within normal limits  TROPONIN T, HIGH SENSITIVITY - Abnormal; Notable for the following components:   Troponin T High Sensitivity 32 (*)    All other components within normal limits  RESP PANEL BY RT-PCR (RSV, FLU A&B, COVID)  RVPGX2  CULTURE, BLOOD (ROUTINE X 2)  CULTURE, BLOOD (ROUTINE X 2)  MRSA NEXT GEN BY PCR, NASAL  PROCALCITONIN  ETHANOL  SALICYLATE LEVEL  SALICYLATE LEVEL  HIV ANTIBODY (ROUTINE TESTING W REFLEX)  BASIC METABOLIC PANEL WITH GFR  MAGNESIUM   PHOSPHORUS  BETA-HYDROXYBUTYRIC ACID  HEMOGLOBIN A1C  VOLATILES,BLD-ACETONE,ETHANOL,ISOPROP,METHANOL  CK  BLOOD GAS, ARTERIAL  SAMPLE TO  BLOOD BANK  TROPONIN T, HIGH SENSITIVITY     EKG:  EKG Interpretation Date/Time:  Friday May 04 2024 03:20:00 EST Ventricular Rate:  102 PR Interval:    QRS Duration:  131 QT Interval:  435 QTC Calculation: 564 R Axis:   93  Text Interpretation: Atrial fibrillation Paired ventricular premature complexes Nonspecific intraventricular conduction delay Inferior infarct, age indeterminate Repol abnrm, severe global ischemia (LM/MVD) Confirmed by Neomi Neptune [phone]) on 05/04/2024 3:51:39 AM          RADIOLOGY: My personal review and interpretation of imaging: CT scan showed no acute traumatic injury.  Patient has multifocal pneumonia.  I have personally reviewed all radiology reports. CT CERVICAL SPINE WO CONTRAST Result Date: 05/04/2024 EXAM: CT CERVICAL SPINE WITHOUT CONTRAST 05/04/2024 05:08:55 AM TECHNIQUE: CT of the cervical spine was performed without the administration of intravenous contrast. Multiplanar reformatted images  are provided for review. Automated exposure control, iterative reconstruction, and/or weight based adjustment of the mA/kV was utilized to reduce the radiation dose to as low as reasonably achievable. COMPARISON: None available. CLINICAL HISTORY: Polytrauma, blunt. FINDINGS: BONES AND ALIGNMENT: There is no evidence of fracture or acute traumatic injury. DEGENERATIVE CHANGES: There is mild diffuse degenerative disc disease throughout the cervical spine. SOFT TISSUES: Endotracheal and orogastric tubes are present. No prevertebral soft tissue swelling. IMPRESSION: 1. No evidence of fracture or acute traumatic injury. 2. Mild diffuse degenerative disc disease throughout the cervical spine. Electronically signed by: Evalene Coho MD 05/04/2024 05:52 AM EST RP Workstation: [id number]   CT CHEST ABDOMEN PELVIS WO CONTRAST Result Date: 05/04/2024 EXAM: CT CHEST, ABDOMEN AND PELVIS WITHOUT CONTRAST 05/04/2024 05:08:55 AM TECHNIQUE: CT of the chest, abdomen and  pelvis was performed without the administration of intravenous contrast. Multiplanar reformatted images are provided for review. Automated exposure control, iterative reconstruction, and/or weight based adjustment of the mA/kV was utilized to reduce the radiation dose to as low as reasonably achievable. COMPARISON: CT angiogram of the chest dated 04/19/2015. CLINICAL HISTORY: Polytrauma, blunt. FINDINGS: CHEST: MEDIASTINUM AND LYMPH NODES: Heart is enlarged. Moderate calcific coronary artery disease present. Endotracheal tube is present with its tip in the proximal right mainstem bronchus. The central airways are clear otherwise. No mediastinal, hilar or axillary lymphadenopathy. LUNGS AND PLEURA: Streaky consolidation/atelectasis present independently within the lower lobes bilaterally. Scattered patchy and reticular opacities within the right upper lobe. No pleural effusion. No pneumothorax. ABDOMEN AND PELVIS: LIVER: Fatty infiltration of the liver. GALLBLADDER AND BILE DUCTS: Unremarkable. No biliary ductal dilatation. SPLEEN: No acute abnormality. PANCREAS: No acute abnormality. ADRENAL GLANDS: No acute abnormality. KIDNEYS, URETERS AND BLADDER: Small exophytic cysts arising laterally from the right kidney. Per consensus, no follow-up is needed for simple Bosniak type 1 and 2 renal cysts, unless the patient has a malignancy history or risk factors. No stones in the kidneys or ureters. No hydronephrosis. No perinephric or periureteral stranding. A Foley catheter is present within the urinary bladder. GI AND BOWEL: Nasogastric tube is present with its tip in the distal stomach. The bowel is unremarkable. There is no bowel obstruction. REPRODUCTIVE ORGANS: No acute abnormality. PERITONEUM AND RETROPERITONEUM: No ascites. No free air. VASCULATURE: Aorta is normal in caliber and demonstrates moderate calcific atheromatous disease. ABDOMINAL AND PELVIS LYMPH NODES: No lymphadenopathy. REPRODUCTIVE ORGANS: No acute  abnormality. BONES AND SOFT TISSUES: Healing or healed fractures of the right anterior and lateral fourth through ninth ribs. Mild-to-moderate chronic compression deformity of L1. Bilateral chronic spondylolysis of L5. No focal soft tissue abnormality. IMPRESSION: 1. Endotracheal tube tip in the proximal right mainstem bronchus; recommend retraction/repositioning. 2. Streaky dependent lower lobe atelectasis/consolidation bilaterally with scattered patchy reticular opacities in the right upper lobe. 3. Healing/healed fractures of the right anterior/lateral 4th through 9th ribs; no evidence of acute traumatic injury. 4. Cardiomegaly with moderate calcific coronary artery disease. 5. Hepatic steatosis and small exophytic right renal cysts. 6. Chronic mild-to-moderate L1 compression deformity and chronic bilateral L5 spondylolysis. Electronically signed by: Evalene Coho MD 05/04/2024 05:51 AM EST RP Workstation: [id number]   CT HEAD WO CONTRAST Result Date: 05/04/2024 EXAM: CT HEAD WITHOUT CONTRAST 05/04/2024 05:08:55 AM TECHNIQUE: CT of the head was performed without the administration of intravenous contrast. Automated exposure control, iterative reconstruction, and/or weight based adjustment of the mA/kV was utilized to reduce the radiation dose to as low as reasonably achievable. COMPARISON: CT of the head dated 04/05/2015. CLINICAL HISTORY: Head trauma,  moderate-severe. FINDINGS: BRAIN AND VENTRICLES: No acute hemorrhage. No evidence of acute infarct. Patchy and confluent decreased attenuation throughout bilateral cerebral hemispheres deep and periventricular white matter, compatible with chronic microvascular ischemic disease. Chronic left thalamic lacunar infarctions. No hydrocephalus. No extra-axial collection. No mass effect or midline shift. Atherosclerotic calcifications within the cavernous internal carotid and vertebral arteries. ORBITS: No acute abnormality. SINUSES: No acute abnormality. SOFT  TISSUES AND SKULL: No acute soft tissue abnormality. No skull fracture. IMPRESSION: 1. No acute intracranial abnormality. 2. Chronic microvascular ischemic disease in bilateral cerebral hemispheres deep and periventricular white matter. 3. Chronic left thalamic lacunar infarctions. 4. Atherosclerotic calcifications within the cavernous internal carotid and vertebral arteries. Electronically signed by: Evalene Coho MD 05/04/2024 05:15 AM EST RP Workstation: [id number]   DG Abdomen 1 View Result Date: 05/04/2024 EXAM: 1 VIEW XRAY OF THE ABDOMEN 05/04/2024 04:07:00 AM COMPARISON: None available. CLINICAL HISTORY: OG placement OG placement FINDINGS: LINES, TUBES AND DEVICES: NGT is in place with the tip in the body of the stomach. BOWEL: Nonobstructive bowel gas pattern. There is moderate retained stool in the transverse colon. No dilated bowel is seen in the abdomen, with the pelvis not included in the image. There is no supine evidence of free air. SOFT TISSUES: There is abdominal aortic atherosclerosis. BONES: No acute fracture. LUNG BASES: Patchy consolidation is noted in both lung bases. IMPRESSION: 1. Enteric tube tip in the stomach. 2. Patchy consolidation in both lung bases. Electronically signed by: Francis Quam MD 05/04/2024 04:21 AM EST RP Workstation: [id number]   DG Chest Port 1 View Result Date: 05/04/2024 EXAM: 1 VIEW XRAY OF THE CHEST 05/04/2024 04:07:00 AM COMPARISON: CTA chest 04/19/2015. CLINICAL HISTORY: Questionable sepsis - evaluate for abnormality. FINDINGS: LINES, TUBES AND DEVICES: ETT is in place with the tip 2.4 cm from the carina. NGT is well within the stomach, the tip probably in the distal gastric body based on the position of the side hole. Multiple overlying monitor leads. LUNGS AND PLEURA: Patchy airspace consolidation in the base of both lungs consistent with pneumonia or aspiration. Mid and upper lungs remain clear. No pleural effusion. No pneumothorax. HEART AND  MEDIASTINUM: Mild cardiomegaly. No findings of acute CHF. The mediastinum is normally outlined with mild aortic atherosclerosis. BONES AND SOFT TISSUES: Healed fracture deformities of the right rib cage. Mild thoracic dextroscoliosis. IMPRESSION: 1. Patchy airspace consolidation in the base of both lungs, consistent with pneumonia or aspiration. Mid and upper lungs remain clear. 2. Endotracheal tube and nasogastric tube are in satisfactory position. Electronically signed by: Francis Quam MD 05/04/2024 04:17 AM EST RP Workstation: [id number]     PROCEDURES:  Critical Care performed: Yes, see critical care procedure note(s)   CRITICAL CARE Performed by: Josette Sink   Total critical care time: 60 minutes  Critical care time was exclusive of separately billable procedures and treating other patients.  Critical care was necessary to treat or prevent imminent or life-threatening deterioration.  Critical care was time spent personally by me on the following activities: development of treatment plan with patient and/or surrogate as well as nursing, discussions with consultants, evaluation of patient's response to treatment, examination of patient, obtaining history from patient or surrogate, ordering and performing treatments and interventions, ordering and review of laboratory studies, ordering and review of radiographic studies, pulse oximetry and re-evaluation of patient's condition.   Procedure Name: Intubation Date/Time: 05/04/2024 3:30 AM  Performed by: Novelle Addair, Josette SAILOR, DOPre-anesthesia Checklist: Patient identified, Patient being monitored, Timeout performed, Emergency Drugs available and  Suction available Oxygen Delivery Method: Ambu bag Preoxygenation: Pre-oxygenation with 100% oxygen Induction Type: IV induction and Rapid sequence Ventilation: Mask ventilation without difficulty Laryngoscope Size: Glidescope and 3 Grade View: Grade II Number of attempts: 1 Placement Confirmation:  CO2 detector, Breath sounds checked- equal and bilateral, ETT inserted through vocal cords under direct vision and Positive ETCO2 Secured at: 26 cm Tube secured with: ETT holder        IMPRESSION / MDM / ASSESSMENT AND PLAN / ED COURSE  I reviewed the triage vital signs and the nursing notes.  Patient here with altered mental status, severe hypothermia.  Unknown downtime.  The patient is on the cardiac monitor to evaluate for evidence of arrhythmia and/or significant heart rate changes.   DIFFERENTIAL DIAGNOSIS (includes but not limited to):   Septic shock, dehydration, severe anemia (no sign of acute hemorrhage), GI bleed, intoxication, intracranial hemorrhage, metabolic encephalopathy, hepatic encephalopathy, hypothermia due to environmental exposure, rhabdomyolysis  Patient's presentation is most consistent with acute presentation with potential threat to life or bodily function.  PLAN: Patient here after he was found outside by his father in the gravel driveway with altered mental status.  Unclear from EMS how long patient was down.  He is extremely hypothermic and altered here.  Currently breathing on his own but agitated requiring IV Ativan .  Unable to get accurate blood pressure or oxygen saturation due to patient's extremities being so cool and due to his constant movement.  He is also repeatedly removing the [employer]  His GCS is currently less than 8.  Given concerns for airway protection, altered mental status and a critically ill patient, decision was made to intubate patient.  Patient sedated using fentanyl .  Once patient was sedated we were able to get a blood pressure and it was in the 60s systolic.  Warm IV fluids 30 mg/kg initiated.  Patient under a Lawyer.  Will start IV Levophed  as well.  He is getting broad-spectrum antibiotics.  Labs, urine, cultures, trauma CT imaging pending.  Will give IV thiamine , update his tetanus vaccine.  Patient is in atrial  fibrillation and has frequent ectopy with runs of ventricular tachycardia.  Will give IV magnesium  as I suspect he is likely to be hypomagnesemic given he has a has a history of alcohol use disorder.  Temp Foley catheter placed.  Patient has a temperature of 84.5 F.   MEDICATIONS GIVEN IN ED: Medications  vancomycin  (VANCOCIN ) IVPB 1000 mg/200 mL premix (has no administration in time range)  0.9 %  sodium chloride  infusion (has no administration in time range)  propofol  (DIPRIVAN ) 1000 MG/100ML infusion (10 mcg/kg/min  101.2 kg Intravenous Rate/Dose Change 05/04/24 0619)  fentaNYL  in NS (42mcg/ml) infusion-PREMIX (25 mcg/hr Intravenous New Bag/Given 05/04/24 0330)  sodium bicarbonate  injection (150 mEq Intravenous Given 05/04/24 0333)  etomidate  (AMIDATE ) injection (20 mg Intravenous Given 05/04/24 0335)  rocuronium  (ZEMURON ) injection (100 mg Intravenous Given 05/04/24 0335)  norepinephrine  (LEVOPHED ) 4mg  in (0.016 mg/mL) premix infusion (10 mcg/min Intravenous New Bag/Given 05/04/24 0346)  vasopressin  (PITRESSIN) 20 Units in 100 mL (0.2 unit/mL) infusion-*FOR SHOCK* (has no administration in time range)  Chlorhexidine  Gluconate Cloth 2 % PADS 6 each (has no administration in time range)  polyethylene glycol (MIRALAX  / GLYCOLAX ) packet 17 g (has no administration in time range)  famotidine  (PEPCID ) tablet 20 mg (has no administration in time range)  heparin  injection 5,000 Units (has no administration in time range)  senna (SENOKOT) tablet 8.6 mg (has  no administration in time range)  polyethylene glycol (MIRALAX  / GLYCOLAX ) packet 17 g (has no administration in time range)  fentaNYL  (SUBLIMAZE ) bolus via infusion 25-100 mcg (has no administration in time range)  sodium bicarbonate  150 mEq in sterile water  1,150 mL infusion ( Intravenous Rate/Dose Change 05/04/24 0654)  insulin  aspart (novoLOG ) injection 0-15 Units (has no administration in time range)  fomepizole  (ANTIZOL )  1,520 mg in sodium chloride  0.9 % 100 mL IVPB (has no administration in time range)  ipratropium-albuterol  (DUONEB) 0.5-2.5 (3) MG/3ML nebulizer solution 3 mL (has no administration in time range)  piperacillin -tazobactam (ZOSYN ) IVPB 3.375 g (3.375 g Intravenous New Bag/Given 05/04/24 0741)  ceFEPIme  (MAXIPIME ) 2 g in sodium chloride  0.9 % 100 mL IVPB (0 g Intravenous Stopped 05/04/24 0440)  metroNIDAZOLE  (FLAGYL ) IVPB 500 mg (500 mg Intravenous New Bag/Given 05/04/24 0637)  sodium chloride  0.9 % bolus 2,000 mL (0 mLs Intravenous Stopped 05/04/24 0425)  Tdap (ADACEL ) injection 0.5 mL (0.5 mLs Intramuscular Given 05/04/24 0429)  thiamine  (VITAMIN B1) injection 100 mg (100 mg Intravenous Given 05/04/24 0424)  LORazepam  (ATIVAN ) injection 1 mg (1 mg Intravenous Given 05/04/24 0315)  LORazepam  (ATIVAN ) injection 1 mg (1 mg Intravenous Given 05/04/24 0313)  etomidate  (AMIDATE ) injection 20 mg (20 mg Intravenous Given 05/04/24 0338)  rocuronium  (ZEMURON ) injection 100 mg (100 mg Intravenous Given 05/04/24 0338)  sodium bicarbonate  injection 150 mEq (150 mEq Intravenous Given 05/04/24 0330)  magnesium  sulfate IVPB 2 g 50 mL (0 g Intravenous Stopped 05/04/24 0459)  sodium chloride  0.9 % bolus 1,000 mL (1,000 mLs Intravenous New Bag/Given 05/04/24 0648)  sodium bicarbonate  injection 100 mEq (100 mEq Intravenous Given 05/04/24 0707)     ED COURSE: Patient's labs show severe metabolic acidosis.  Given 3 amps of bicarb and will start a bicarb infusion.  He does have a mild lactic acidosis which could be from sepsis but also is positive for salicylates.  There is no family at bedside to provide any history of whether or not patient takes regular aspirin  or if there was any concern that this could be an overdose.  Will reach out to poison control but will closely monitor his urine output.  He will likely need nephrology input as well.  Tylenol  level is negative.  Discussed this with Danielle with poison control.  She  recommends repeating a salicylate level in 2 hours and continuing aggressive IV hydration.  She agrees that patient will likely need evaluation for dialysis.   Ethanol level here is negative.  Drug screen shows benzodiazepines but this was obtained after we gave him IV Ativan .   Labs show new AKI and shock liver.  INR is 3.5.   Imaging shows possible multifocal pneumonia versus aspiration.  He does have a procalcitonin of 63.2 and an elevated lactic acid level.  Again he is getting 30 Miller per kilogram IV fluid bolus and broad-spectrum antibiotics for sepsis and septic shock.   I suspect that his metabolic acidosis is likely from uremia from renal failure, lactic acidosis but again could also be related to salicylates.  Poison control also mention the possibility of toxic alcohol ingestion.  Again no family at bedside to provide any history but given his history of substance use disorder and his profound metabolic acidosis, will give a dose of IV fomepizole  per poison control recommendations.  Will send toxic alcohol panel to be run.  I do not see in patient's record that he has any history of diabetes but he is mildly hyperglycemic here.  No ketonuria but will add on a beta hydroxybutyric acid level.  Patient is also in atrial fibrillation which may be related to alcohol use versus chronic versus secondary to severe hypothermia.  He is also having frequent PVCs, PACs and occasional runs of ventricular tachycardia.  Given no sustained ventricular tachycardia, we will hold off on amiodarone or other antiarrhythmic as I suspect that this is secondary to his profound hypothermia and will continue aggressive measures to warm him.  His potassium level is 4.6 and magnesium  is 3.1.   I have attempted to call his father without any answer and no way to leave a voicemail.  Will continue to attempt to get in touch with him.   5:42 AM  Called Adithya Difrancesco (father) at [phone].  He states that the last  time he saw patient was around suppertime about 5 to 6 PM and states he was normal at that time.  He states that he woke up around 1:30 AM and noticed that his bedroom light was on and found the patient lying on the ground outside of their carport in the gravel driveway.  States he was awake but could not talk much.  When asked why he thought the patient was unable to talk father states that because he was cold.  He does not think that he was intoxicated but states that he does not know what medications the patient takes or if he has used any drugs or alcohol recently.  States the last time he notes that the patient drink was about 2 weeks ago.  He states the patient did fall about 2 days ago because he loses his balance often.  He denies any other known injury.  When asked why the patient has so many abrasions of the father states that it was because he was crawling in the gravel driveway.  No known recent fevers, cough, vomiting or diarrhea.  He does not think that the patient had any toxic alcohols or illicit drugs but is not sure.  He does report that the patient takes Anastacia powders regularly.  Denies that the patient has had any recent suicidal thoughts and does not think that this was a suicide attempt.  Father has been updated on patient's critical status.  6:00 AM  Pt's CT scans reviewed and interpreted by myself and the radiologist and show no acute traumatic injury.  Endotracheal tube is in the right mainstem bronchus and will be retracted 2 cm.  7:00 AM  Repeat salicylate level is downtrending to 8.7.   CONSULTS: ICU team consulted and will admit.  Dr. Douglas with nephrology consulted.  They will see patient in consultation for possible dialysis given severe metabolic acidosis with positive for salicylates, potential exposure to toxic alcohols.  OUTSIDE RECORDS REVIEWED: Reviewed recent pulmonology notes.       FINAL CLINICAL IMPRESSION(S) / ED DIAGNOSES   Final diagnoses:   Altered mental status, unspecified altered mental status type  Shock (HCC)  Acute respiratory failure, unspecified whether with hypoxia or hypercapnia (HCC)  Metabolic acidosis  Hypothermia, initial encounter  Atrial fibrillation, unspecified type (HCC)  Ventricular tachycardia (HCC)  Shock liver     Rx / DC Orders   ED Discharge Orders     None        Note:  This document was prepared using Dragon voice recognition software and may include unintentional dictation errors.   Emmauel Hallums, Josette SAILOR, DO 05/04/24 [phone]  "

## 2024-05-05 ENCOUNTER — Inpatient Hospital Stay: Admit: 2024-05-05 | Discharge: 2024-05-05 | Disposition: A | Payer: MEDICAID

## 2024-05-05 DIAGNOSIS — R578 Other shock: Secondary | ICD-10-CM | POA: Diagnosis not present

## 2024-05-05 LAB — GLUCOSE, CAPILLARY
Glucose-Capillary: 121 mg/dL — ABNORMAL HIGH (ref 70–99)
Glucose-Capillary: 108 mg/dL — ABNORMAL HIGH (ref 70–99)
Glucose-Capillary: 151 mg/dL — ABNORMAL HIGH (ref 70–99)
Glucose-Capillary: 140 mg/dL — ABNORMAL HIGH (ref 70–99)
Glucose-Capillary: 106 mg/dL — ABNORMAL HIGH (ref 70–99)
Glucose-Capillary: 102 mg/dL — ABNORMAL HIGH (ref 70–99)

## 2024-05-05 LAB — ECHOCARDIOGRAM COMPLETE
AR max vel: 2 cm2
AV Area VTI: 1.99 cm2
AV Area mean vel: 1.89 cm2
AV Mean grad: 11.4 mmHg
AV Peak grad: 21.3 mmHg
Ao pk vel: 2.31 m/s
Area-P 1/2: 4.68 cm2
Calc EF: 51.9 %
Height: 68 in
S' Lateral: 2.6 cm
Single Plane A2C EF: 53.9 %
Single Plane A4C EF: 53 %
Weight: 3664.93 [oz_av]

## 2024-05-05 LAB — HEPATIC FUNCTION PANEL
ALT: 1398 U/L — ABNORMAL HIGH (ref 0–44)
AST: 1420 U/L — ABNORMAL HIGH (ref 15–41)
Albumin: 2.5 g/dL — ABNORMAL LOW (ref 3.5–5.0)
Alkaline Phosphatase: 102 U/L (ref 38–126)
Bilirubin, Direct: 0.9 mg/dL — ABNORMAL HIGH (ref 0.0–0.2)
Indirect Bilirubin: 0.3 mg/dL (ref 0.3–0.9)
Total Bilirubin: 1.2 mg/dL (ref 0.0–1.2)
Total Protein: 4.8 g/dL — ABNORMAL LOW (ref 6.5–8.1)

## 2024-05-05 LAB — CBC
HCT: 34 % — ABNORMAL LOW (ref 39.0–52.0)
Hemoglobin: 11.8 g/dL — ABNORMAL LOW (ref 13.0–17.0)
MCH: 32.2 pg (ref 26.0–34.0)
MCHC: 34.7 g/dL (ref 30.0–36.0)
MCV: 92.9 fL (ref 80.0–100.0)
Platelets: 87 K/uL — ABNORMAL LOW (ref 150–400)
RBC: 3.66 MIL/uL — ABNORMAL LOW (ref 4.22–5.81)
RDW: 15.9 % — ABNORMAL HIGH (ref 11.5–15.5)
WBC: 4 K/uL (ref 4.0–10.5)
nRBC: 0.5 % — ABNORMAL HIGH (ref 0.0–0.2)

## 2024-05-05 LAB — RESPIRATORY PANEL BY PCR

## 2024-05-05 LAB — POTASSIUM: Potassium: 3.3 mmol/L — ABNORMAL LOW (ref 3.5–5.1)

## 2024-05-05 LAB — PHOSPHORUS
Phosphorus: 1.7 mg/dL — ABNORMAL LOW (ref 2.5–4.6)
Phosphorus: 1.8 mg/dL — ABNORMAL LOW (ref 2.5–4.6)

## 2024-05-05 LAB — MAGNESIUM: Magnesium: 2.2 mg/dL (ref 1.7–2.4)

## 2024-05-05 LAB — BASIC METABOLIC PANEL WITH GFR
Anion gap: 9 (ref 5–15)
BUN: 20 mg/dL (ref 8–23)
CO2: 30 mmol/L (ref 22–32)
Calcium: 7.1 mg/dL — ABNORMAL LOW (ref 8.9–10.3)
Chloride: 99 mmol/L (ref 98–111)
Creatinine, Ser: 1.27 mg/dL — ABNORMAL HIGH (ref 0.61–1.24)
GFR, Estimated: >60 mL/min
Glucose, Bld: 113 mg/dL — ABNORMAL HIGH (ref 70–99)
Potassium: 3.1 mmol/L — ABNORMAL LOW (ref 3.5–5.1)
Sodium: 138 mmol/L (ref 135–145)

## 2024-05-05 LAB — BLOOD GAS, ARTERIAL
Acid-Base Excess: 10 mmol/L — ABNORMAL HIGH (ref 0.0–2.0)
Bicarbonate: 33.3 mmol/L — ABNORMAL HIGH (ref 20.0–28.0)
O2 Saturation: 91.5 %
Patient temperature: 37
pCO2 arterial: 39 mmHg (ref 32–48)
pH, Arterial: 7.54 — ABNORMAL HIGH (ref 7.35–7.45)
pO2, Arterial: 56 mmHg — ABNORMAL LOW (ref 83–108)

## 2024-05-05 LAB — LACTIC ACID, PLASMA: Lactic Acid, Venous: 1.8 mmol/L (ref 0.5–1.9)

## 2024-05-05 LAB — TRIGLYCERIDES: Triglycerides: 209 mg/dL — ABNORMAL HIGH

## 2024-05-05 LAB — CK: Total CK: 999 U/L — ABNORMAL HIGH (ref 49–397)

## 2024-05-05 MED ORDER — POTASSIUM PHOSPHATES 15 MMOLE/5ML IV SOLN
30.0000 mmol | Freq: Once | INTRAVENOUS | Status: AC
  Administered 2024-05-05: 30 mmol via INTRAVENOUS
  Filled 2024-05-05: qty 10

## 2024-05-05 MED ORDER — POTASSIUM CHLORIDE 10 MEQ/100ML IV SOLN
10.0000 meq | INTRAVENOUS | Status: AC
  Administered 2024-05-05 (×2): 10 meq via INTRAVENOUS
  Filled 2024-05-05 (×2): qty 100

## 2024-05-05 NOTE — Consult Note (Signed)
 Pharmacy Consult Note - Electrolytes  Sodium (mmol/L)  Date Value  05/05/2024 138  02/11/2022 136  03/07/2014 143   Potassium (mmol/L)  Date Value  05/05/2024 3.1 (L)  03/07/2014 3.2 (L)   Magnesium  (mg/dL)  Date Value  98/82/7973 2.2  03/04/2014 2.3   Calcium  (mg/dL)  Date Value  98/82/7973 7.1 (L)   Calcium , Total (mg/dL)  Date Value  88/80/7984 7.4 (L)   Albumin (g/dL)  Date Value  98/83/7973 3.4 (L)  10/22/2020 4.2  03/07/2014 3.4   Phosphorus (mg/dL)  Date Value  98/82/7973 1.7 (L)    ASSESSMENT: 64 y.o. male with PMH including CAD/STEMI s/p DES (2012), alcohol abuse presents with hypothermia after being found unresponsive outside in cold. Pharmacy has been consulted to monitor and replace electrolytes. Patient's renal function is currently poor because he is in AKI.  Lab Results  Component Value Date   CREATININE 1.27 (H) 05/05/2024   CREATININE 1.66 (H) 05/04/2024   CREATININE 2.88 (H) 05/04/2024    mIVF: NS @ 150 mL/hr  Pertinent medications: n/a  Goal of Therapy: Electrolytes WNL  PLAN: Kphos IV 30 mmol x 1 ordered by medical team.  Kcl 10 mEq x 2 later in the evening.  F/u with AM labs.   Thank you for allowing pharmacy to be a part of this patients care.  Cathaleen Blanch, PharmD, BCPS Clinical Pharmacist 05/05/2024 8:06 AM

## 2024-05-05 NOTE — Plan of Care (Signed)
 Upon wake up assessment, patient opened eyes to command, showed purposeful movement in all extremities, nodded appropriately to his name. Patient was calm upon wake up. Sedation was resumed for planned Bronchoscopy. Bronchoscopy completed by Dr Parris, BAL sent for culture.   Problem: Fluid Volume: Goal: Ability to maintain a balanced intake and output will improve Outcome: Progressing   Problem: Metabolic: Goal: Ability to maintain appropriate glucose levels will improve Outcome: Progressing   Problem: Clinical Measurements: Goal: Ability to maintain clinical measurements within normal limits will improve Outcome: Progressing Goal: Diagnostic test results will improve Outcome: Progressing

## 2024-05-05 NOTE — Progress Notes (Signed)
 " [facility]  PROGRESS NOTE   Subjective:   Patient is intubated and sedated.  Urine output 3.7 L.  Renal indices are significantly improving.  Objective:  Vital signs: Blood pressure 125/62, pulse 80, temperature 99.7 F (37.6 C), resp. rate (!) 28, height 5' 8 (1.727 m), weight 103.9 kg, SpO2 100%.  Intake/Output Summary (Last 24 hours) at 05/05/2024 2009 Last data filed at 05/05/2024 1900 Gross per 24 hour  Intake 4312.71 ml  Output 3700 ml  Net 612.71 ml   Filed Weights   05/04/24 0326 05/05/24 0500  Weight: 101.2 kg 103.9 kg     Physical Exam: General:  No acute distress  Head:  Normocephalic, atraumatic. Moist oral mucosal membranes  Eyes:  Anicteric  Neck:  Supple  Lungs:   Clear to auscultation, normal effort  Heart:  S1S2 no rubs  Abdomen:   Soft, nontender, bowel sounds present  Extremities:  peripheral edema.  Neurologic: Vented and sedated.  Skin:  No lesions  Access:     Basic Metabolic Panel: Recent Labs  Lab 05/04/24 0318 05/04/24 0518 05/04/24 1746 05/04/24 1747 05/05/24 0345  NA 132* 133*  --  140 138  K 4.6 5.4*  --  3.3* 3.1*  CL 94* 96*  --  100 99  CO2 10* 11*  --  28 30  GLUCOSE 260* 316*  --  134* 113*  BUN 37* 35*  --  28* 20  CREATININE 3.53* 2.88*  --  1.66* 1.27*  CALCIUM  8.3* 7.5*  --  6.7* 7.1*  MG 3.1* 3.4* 2.3  --  2.2  PHOS  --  8.2* 2.3*  --  1.7*   GFR: Estimated Creatinine Clearance: 70.5 mL/min (A) (by C-G formula based on SCr of 1.27 mg/dL (H)).  Liver Function Tests: Recent Labs  Lab 05/04/24 0318 05/05/24 0332  AST 3,895* 1,420*  ALT 2,503* 1,398*  ALKPHOS 128* 102  BILITOT 1.0 1.2  PROT 5.9* 4.8*  ALBUMIN 3.4* 2.5*   No results for input(s): LIPASE, AMYLASE in the last 168 hours. Recent Labs  Lab 05/04/24 0318  AMMONIA 40*    CBC: Recent Labs  Lab 05/04/24 0318 05/04/24 0518 05/05/24 0345  WBC 9.3 8.3 4.0  NEUTROABS 7.3  --   --   HGB 12.7* 13.2 11.8*  HCT 40.8 41.8 34.0*   MCV 102.3* 101.0* 92.9  PLT 103* 81* 87*     HbA1C: Hgb A1c MFr Bld  Date/Time Value Ref Range Status  05/04/2024 05:18 AM 5.5 4.8 - 5.6 % Final    Comment:    (NOTE) Diagnosis of Diabetes The following HbA1c ranges recommended by the American Diabetes Association (ADA) may be used as an aid in the diagnosis of diabetes mellitus.  Hemoglobin             Suggested A1C NGSP%              Diagnosis  <5.7                   Non Diabetic  5.7-6.4                Pre-Diabetic  >6.4                   Diabetic  <7.0                   Glycemic control for  adults with diabetes.    12/31/2020 11:28 AM 5.5 4.8 - 5.6 % Final    Comment:             Prediabetes: 5.7 - 6.4          Diabetes: >6.4          Glycemic control for adults with diabetes: <7.0     Urinalysis: Recent Labs    05/04/24 0322 05/04/24 1750  COLORURINE AMBER* AMBER*  LABSPEC 1.021 1.019  PHURINE 5.0 5.0  GLUCOSEU NEGATIVE NEGATIVE  HGBUR SMALL* LARGE*  BILIRUBINUR NEGATIVE NEGATIVE  KETONESUR NEGATIVE NEGATIVE  PROTEINUR 30* 30*  NITRITE NEGATIVE NEGATIVE  LEUKOCYTESUR NEGATIVE NEGATIVE      Imaging: ECHOCARDIOGRAM COMPLETE Result Date: 05/05/2024    ECHOCARDIOGRAM REPORT   Patient Name:   Larry French Date of Exam: 05/05/2024 Medical Rec #:  [id number]            Height:       68.0 in Accession #:    [id number]           Weight:       229.1 lb Date of Birth:  02-01-1961            BSA:          2.165 m Patient Age:    64 years             BP:           119/65 mmHg Patient Gender: M                    HR:           96 bpm. Exam Location:  [facility] Procedure: 2D Echo, Color Doppler and Cardiac Doppler (Both Spectral and Color            Flow Doppler were utilized during procedure). Indications:     R57.9 Shock  History:         Patient has prior history of Echocardiogram examinations. CAD                  and Previous Myocardial Infarction; Risk Factors:Hypertension                   and Polysubstance Abuse.  Sonographer:     L. Thornton-Maynard, RDCS Referring Phys:  [id number] ROBET KIM Diagnosing Phys: Redell Cave MD IMPRESSIONS  1. Left ventricular ejection fraction, by estimation, is 55 to 60%. The left ventricle has normal function. The left ventricle has no regional wall motion abnormalities. There is mild left ventricular hypertrophy. Left ventricular diastolic parameters were normal.  2. Right ventricular systolic function is normal. The right ventricular size is normal.  3. The mitral valve is normal in structure. No evidence of mitral valve regurgitation.  4. The aortic valve is calcified. Aortic valve regurgitation is mild. Mild aortic valve stenosis. Aortic valve mean gradient measures 11.4 mmHg.  5. The inferior vena cava is normal in size with greater than 50% respiratory variability, suggesting right atrial pressure of 3 mmHg. FINDINGS  Left Ventricle: Left ventricular ejection fraction, by estimation, is 55 to 60%. The left ventricle has normal function. The left ventricle has no regional wall motion abnormalities. Global longitudinal strain performed but not reported based on interpreter judgement due to suboptimal tracking. The left ventricular internal cavity size was normal in size. There is mild left ventricular hypertrophy. Left ventricular diastolic parameters were normal. Right Ventricle: The right ventricular size is  normal. No increase in right ventricular wall thickness. Right ventricular systolic function is normal. Left Atrium: Left atrial size was normal in size. Right Atrium: Right atrial size was normal in size. Pericardium: There is no evidence of pericardial effusion. Mitral Valve: The mitral valve is normal in structure. No evidence of mitral valve regurgitation. Tricuspid Valve: The tricuspid valve is normal in structure. Tricuspid valve regurgitation is mild. Aortic Valve: The aortic valve is calcified. Aortic valve regurgitation is mild. Mild aortic  stenosis is present. Aortic valve mean gradient measures 11.4 mmHg. Aortic valve peak gradient measures 21.3 mmHg. Aortic valve area, by VTI measures 1.99 cm. Pulmonic Valve: The pulmonic valve was normal in structure. Pulmonic valve regurgitation is trivial. Aorta: The aortic root and ascending aorta are structurally normal, with no evidence of dilitation. Venous: The inferior vena cava is normal in size with greater than 50% respiratory variability, suggesting right atrial pressure of 3 mmHg. IAS/Shunts: No atrial level shunt detected by color flow Doppler.  LEFT VENTRICLE PLAX 2D LVIDd:         4.30 cm      Diastology LVIDs:         2.60 cm      LV e' medial:   8.16 cm/s LV PW:         1.20 cm      LV E/e' medial: 13.5 LV IVS:        1.40 cm LVOT diam:     1.90 cm LV SV:         66 LV SV Index:   30 LVOT Area:     2.84 cm LV IVRT:       71 msec  LV Volumes (MOD) LV vol d, MOD A2C: 96.9 ml LV vol d, MOD A4C: 102.0 ml LV vol s, MOD A2C: 44.7 ml LV vol s, MOD A4C: 47.9 ml LV SV MOD A2C:     52.2 ml LV SV MOD A4C:     102.0 ml LV SV MOD BP:      52.0 ml RIGHT VENTRICLE             IVC RV Basal diam:  3.20 cm     IVC diam: 1.60 cm RV Mid diam:    2.20 cm RV S prime:     19.70 cm/s TAPSE (M-mode): 2.1 cm LEFT ATRIUM             Index        RIGHT ATRIUM           Index LA diam:        3.40 cm 1.57 cm/m   RA Area:     12.90 cm LA Vol (A2C):   50.7 ml 23.42 ml/m  RA Volume:   28.80 ml  13.30 ml/m LA Vol (A4C):   21.1 ml 9.75 ml/m LA Biplane Vol: 36.1 ml 16.67 ml/m  AORTIC VALVE                     PULMONIC VALVE AV Area (Vmax):    2.00 cm      PV Vmax:          1.34 m/s AV Area (Vmean):   1.89 cm      PV Peak grad:     7.2 mmHg AV Area (VTI):     1.99 cm      PR End Diast Vel: 7.18 msec AV Vmax:  230.80 cm/s AV Vmean:          153.200 cm/s AV VTI:            0.330 m AV Peak Grad:      21.3 mmHg AV Mean Grad:      11.4 mmHg LVOT Vmax:         163.00 cm/s LVOT Vmean:        102.000 cm/s LVOT VTI:           0.232 m LVOT/AV VTI ratio: 0.70  AORTA Ao Root diam: 3.20 cm Ao Asc diam:  3.70 cm MITRAL VALVE MV Area (PHT): 4.68 cm     SHUNTS MV Decel Time: 162 msec     Systemic VTI:  0.23 m MV E velocity: 110.00 cm/s  Systemic Diam: 1.90 cm MV A velocity: 105.00 cm/s MV E/A ratio:  1.05 Redell Cave MD Electronically signed by Redell Cave MD Signature Date/Time: 05/05/2024/3:45:47 PM    Final    CT CERVICAL SPINE WO CONTRAST Result Date: 05/04/2024 EXAM: CT CERVICAL SPINE WITHOUT CONTRAST 05/04/2024 05:08:55 AM TECHNIQUE: CT of the cervical spine was performed without the administration of intravenous contrast. Multiplanar reformatted images are provided for review. Automated exposure control, iterative reconstruction, and/or weight based adjustment of the mA/kV was utilized to reduce the radiation dose to as low as reasonably achievable. COMPARISON: None available. CLINICAL HISTORY: Polytrauma, blunt. FINDINGS: BONES AND ALIGNMENT: There is no evidence of fracture or acute traumatic injury. DEGENERATIVE CHANGES: There is mild diffuse degenerative disc disease throughout the cervical spine. SOFT TISSUES: Endotracheal and orogastric tubes are present. No prevertebral soft tissue swelling. IMPRESSION: 1. No evidence of fracture or acute traumatic injury. 2. Mild diffuse degenerative disc disease throughout the cervical spine. Electronically signed by: Evalene Coho MD 05/04/2024 05:52 AM EST RP Workstation: [id number]   CT CHEST ABDOMEN PELVIS WO CONTRAST Result Date: 05/04/2024 EXAM: CT CHEST, ABDOMEN AND PELVIS WITHOUT CONTRAST 05/04/2024 05:08:55 AM TECHNIQUE: CT of the chest, abdomen and pelvis was performed without the administration of intravenous contrast. Multiplanar reformatted images are provided for review. Automated exposure control, iterative reconstruction, and/or weight based adjustment of the mA/kV was utilized to reduce the radiation dose to as low as reasonably achievable. COMPARISON: CT  angiogram of the chest dated 04/19/2015. CLINICAL HISTORY: Polytrauma, blunt. FINDINGS: CHEST: MEDIASTINUM AND LYMPH NODES: Heart is enlarged. Moderate calcific coronary artery disease present. Endotracheal tube is present with its tip in the proximal right mainstem bronchus. The central airways are clear otherwise. No mediastinal, hilar or axillary lymphadenopathy. LUNGS AND PLEURA: Streaky consolidation/atelectasis present independently within the lower lobes bilaterally. Scattered patchy and reticular opacities within the right upper lobe. No pleural effusion. No pneumothorax. ABDOMEN AND PELVIS: LIVER: Fatty infiltration of the liver. GALLBLADDER AND BILE DUCTS: Unremarkable. No biliary ductal dilatation. SPLEEN: No acute abnormality. PANCREAS: No acute abnormality. ADRENAL GLANDS: No acute abnormality. KIDNEYS, URETERS AND BLADDER: Small exophytic cysts arising laterally from the right kidney. Per consensus, no follow-up is needed for simple Bosniak type 1 and 2 renal cysts, unless the patient has a malignancy history or risk factors. No stones in the kidneys or ureters. No hydronephrosis. No perinephric or periureteral stranding. A Foley catheter is present within the urinary bladder. GI AND BOWEL: Nasogastric tube is present with its tip in the distal stomach. The bowel is unremarkable. There is no bowel obstruction. REPRODUCTIVE ORGANS: No acute abnormality. PERITONEUM AND RETROPERITONEUM: No ascites. No free air. VASCULATURE: Aorta is normal in caliber and demonstrates moderate  calcific atheromatous disease. ABDOMINAL AND PELVIS LYMPH NODES: No lymphadenopathy. REPRODUCTIVE ORGANS: No acute abnormality. BONES AND SOFT TISSUES: Healing or healed fractures of the right anterior and lateral fourth through ninth ribs. Mild-to-moderate chronic compression deformity of L1. Bilateral chronic spondylolysis of L5. No focal soft tissue abnormality. IMPRESSION: 1. Endotracheal tube tip in the proximal right mainstem  bronchus; recommend retraction/repositioning. 2. Streaky dependent lower lobe atelectasis/consolidation bilaterally with scattered patchy reticular opacities in the right upper lobe. 3. Healing/healed fractures of the right anterior/lateral 4th through 9th ribs; no evidence of acute traumatic injury. 4. Cardiomegaly with moderate calcific coronary artery disease. 5. Hepatic steatosis and small exophytic right renal cysts. 6. Chronic mild-to-moderate L1 compression deformity and chronic bilateral L5 spondylolysis. Electronically signed by: Evalene Coho MD 05/04/2024 05:51 AM EST RP Workstation: [id number]   CT HEAD WO CONTRAST Result Date: 05/04/2024 EXAM: CT HEAD WITHOUT CONTRAST 05/04/2024 05:08:55 AM TECHNIQUE: CT of the head was performed without the administration of intravenous contrast. Automated exposure control, iterative reconstruction, and/or weight based adjustment of the mA/kV was utilized to reduce the radiation dose to as low as reasonably achievable. COMPARISON: CT of the head dated 04/05/2015. CLINICAL HISTORY: Head trauma, moderate-severe. FINDINGS: BRAIN AND VENTRICLES: No acute hemorrhage. No evidence of acute infarct. Patchy and confluent decreased attenuation throughout bilateral cerebral hemispheres deep and periventricular white matter, compatible with chronic microvascular ischemic disease. Chronic left thalamic lacunar infarctions. No hydrocephalus. No extra-axial collection. No mass effect or midline shift. Atherosclerotic calcifications within the cavernous internal carotid and vertebral arteries. ORBITS: No acute abnormality. SINUSES: No acute abnormality. SOFT TISSUES AND SKULL: No acute soft tissue abnormality. No skull fracture. IMPRESSION: 1. No acute intracranial abnormality. 2. Chronic microvascular ischemic disease in bilateral cerebral hemispheres deep and periventricular white matter. 3. Chronic left thalamic lacunar infarctions. 4. Atherosclerotic calcifications within  the cavernous internal carotid and vertebral arteries. Electronically signed by: Evalene Coho MD 05/04/2024 05:15 AM EST RP Workstation: [id number]   DG Abdomen 1 View Result Date: 05/04/2024 EXAM: 1 VIEW XRAY OF THE ABDOMEN 05/04/2024 04:07:00 AM COMPARISON: None available. CLINICAL HISTORY: OG placement OG placement FINDINGS: LINES, TUBES AND DEVICES: NGT is in place with the tip in the body of the stomach. BOWEL: Nonobstructive bowel gas pattern. There is moderate retained stool in the transverse colon. No dilated bowel is seen in the abdomen, with the pelvis not included in the image. There is no supine evidence of free air. SOFT TISSUES: There is abdominal aortic atherosclerosis. BONES: No acute fracture. LUNG BASES: Patchy consolidation is noted in both lung bases. IMPRESSION: 1. Enteric tube tip in the stomach. 2. Patchy consolidation in both lung bases. Electronically signed by: Francis Quam MD 05/04/2024 04:21 AM EST RP Workstation: [id number]   DG Chest Port 1 View Result Date: 05/04/2024 EXAM: 1 VIEW XRAY OF THE CHEST 05/04/2024 04:07:00 AM COMPARISON: CTA chest 04/19/2015. CLINICAL HISTORY: Questionable sepsis - evaluate for abnormality. FINDINGS: LINES, TUBES AND DEVICES: ETT is in place with the tip 2.4 cm from the carina. NGT is well within the stomach, the tip probably in the distal gastric body based on the position of the side hole. Multiple overlying monitor leads. LUNGS AND PLEURA: Patchy airspace consolidation in the base of both lungs consistent with pneumonia or aspiration. Mid and upper lungs remain clear. No pleural effusion. No pneumothorax. HEART AND MEDIASTINUM: Mild cardiomegaly. No findings of acute CHF. The mediastinum is normally outlined with mild aortic atherosclerosis. BONES AND SOFT TISSUES: Healed fracture deformities of the right rib  cage. Mild thoracic dextroscoliosis. IMPRESSION: 1. Patchy airspace consolidation in the base of both lungs, consistent with pneumonia  or aspiration. Mid and upper lungs remain clear. 2. Endotracheal tube and nasogastric tube are in satisfactory position. Electronically signed by: Francis Quam MD 05/04/2024 04:17 AM EST RP Workstation: [id number]     Medications:    fentaNYL  infusion INTRAVENOUS 25 mcg/hr (05/05/24 1900)   fomepizole  (ANTIZOL ) 1,010 mg in sodium chloride  0.9 % 100 mL IVPB 202 mL/hr at 05/05/24 1900   norepinephrine  (LEVOPHED ) Adult infusion 10 mcg/min (05/05/24 1900)   piperacillin -tazobactam (ZOSYN )  IV Stopped (05/05/24 1832)   potassium chloride  10 mEq (05/05/24 1936)   propofol  (DIPRIVAN ) infusion 40 mcg/kg/min (05/05/24 1922)   vasopressin  Stopped (05/04/24 0747)    Chlorhexidine  Gluconate Cloth  6 each Topical Daily   famotidine   20 mg Per Tube BID   insulin  aspart  0-15 Units Subcutaneous Q4H   mouth rinse  15 mL Mouth Rinse Q2H   polyethylene glycol  17 g Per Tube Daily   senna  1 tablet Per Tube BID    Assessment/ Plan:     64 year old male with history of hypertension, coronary artery disease, PTSD, anxiety, history of polysubstance abuse now admitted with history of respiratory failure subsequently leading to ventilator support.  He was also given fomepizole .  He also has severe metabolic acidosis and rhabdomyolysis.  #1: Acute kidney injury: Patient had a creatinine of 3.53 on admission which has improved to 1.27 today.  Urine output has been excellent.  Acute kidney injury is most likely secondary to acute tubular necrosis.  No need for any dialysis intervention.  #2: EtOH/transaminitis: Possibly secondary to EtOH abuse.  He was given fomepizole .  #3: Sepsis/pneumonia: Continue antibiotics with Flagyl , vancomycin  and cefepime .  #4: Vent dependent respiratory failure: Management as per ICU protocol.  #5: Hypotension/circulatory shock: Presently on Levophed  and vasopressin  wean as tolerated.  Overall prognosis guarded. Labs and medications reviewed. Will continue to follow along  with you.   LOS: 1 Pinkey Edman, MD [facility] 1/17/20268:09 PM  "

## 2024-05-05 NOTE — Procedures (Signed)
 " PROCEDURE: BRONCHOSCOPY Therapeutic Aspiration of Tracheobronchial Tree Bronchoalveolar lavage   PROCEDURE DATE: 05/05/2024  TIME:  NAME:  Larry French  DOB:06/01/1960  MRN: [id number] LOC:  IC13A/IC13A-AA    HOSP DAY: @LENGTHOFSTAYDAYS @ CODE STATUS:      Code Status Orders  (From admission, onward)           Start     Ordered   05/04/24 0411  Full code  Continuous       Question:  By:  Answer:  Consent: discussion documented in EHR   05/04/24 0414           Code Status History     Date Active Date Inactive Code Status Order ID Comments User Context   06/19/2015 0110 06/26/2015 1939 Full Code [id number]  Ray Jacques FORBES DEVONNA Inpatient   04/22/2015 2156 04/29/2015 1854 Full Code [id number]  Ray Jacques FORBES PA-C Inpatient   04/09/2015 1219 04/10/2015 1425 Full Code [id number]  Darron Deatrice LABOR, MD Inpatient   04/05/2015 2048 04/09/2015 1219 Full Code [id number]  Tobie Press, MD Inpatient           Indications/Preliminary Diagnosis:   Consent: (Place X beside choice/s below)  The benefits, risks and possible complications of the procedure were        explained to:  ___ patient  ___ patient's family  __x_ other:___emergent________  who verbalized understanding and gave:  ___ verbal  ___ written  ___ verbal and written  ___ telephone  ___ other:________ consent.      Unable to obtain consent; procedure performed on emergent basis.     Other:       PRESEDATION ASSESSMENT: History and Physical has been performed. Patient meds and allergies have been reviewed. Presedation airway examination has been performed and documented. Baseline vital signs, sedation score, oxygenation status, and cardiac rhythm were reviewed. Patient was deemed to be in satisfactory condition to undergo the procedure.    PREMEDICATIONS:   Sedative/Narcotic Amt Dose   Versed   mg   Fentanyl   mcg  Diprivan  gtt mg            PROCEDURE DETAILS: Timeout performed and correct  patient, name, & ID confirmed. Following prep per Pulmonary policy, appropriate sedation was administered. The Bronchoscope was inserted in to oral cavity with bite block in place. Therapeutic aspiration of Tracheobronchial tree was performed.  Airway exam proceeded with findings, technical procedures, and specimen collection as noted below. At the end of exam the scope was withdrawn without incident. Impression and Plan as noted below.           Airway Prep (Place X beside choice below)   1% Transtracheal Lidocaine  Anesthetization 7 cc   Patient prepped per Bronchoscopy Lab Policy       Insertion Route (Place X beside choice below)   Nasal   Oral  x Endotracheal Tube   Tracheostomy   INTRAPROCEDURE MEDICATIONS:  Sedative/Narcotic Amt Dose   Versed   mg   Fentanyl   mcg  Diprivan  gtt mg       Medication Amt Dose  Medication Amt Dose  Lidocaine  1%  cc  Epinephrine 1:10,000 sol  cc  Xylocaine  4%  cc  Cocaine  cc   TECHNICAL PROCEDURES: (Place X beside choice below)   Procedures  Description    None     Electrocautery     Cryotherapy     Balloon Dilatation     Bronchography     Stent Placement   x  Therapeutic  Aspiration Bilateral     Laser/Argon Plasma    Brachytherapy Catheter Placement    Foreign Body Removal         SPECIMENS (Sites): (Place X beside choice below)  Specimens Description   No Specimens Obtained     Washings   x Lavage Right middle lobe   Biopsies    Fine Needle Aspirates    Brushings    Sputum    FINDINGS:    Media Information  Document Information   Media Information  Document Information   Media Information  Mucus plugging bilaterally worse at left lung    BAL done at right middle lobe sent for microbiology with gram stain and cultures  ESTIMATED BLOOD LOSS: none     Rudie Sermons, M.D.  Pulmonary & Critical Care Medicine  [facility]    "

## 2024-05-05 NOTE — Progress Notes (Signed)
 "  NAME:  Larry French, MRN:  [id number], DOB:  1960-08-05, LOS: 1 ADMISSION DATE:  05/04/2024, CONSULTATION DATE:  05/03/24 REFERRING MD:  Dr. Neomi, CHIEF COMPLAINT:  AMS   History of Present Illness:  Larry French is a 64 y.o. male with history of COPD, alcohol and benzodiazepine abuse, hypertension, hyperlipidemia and CAD who presented to the [facility] ED after he was found down outside of his father's house with altered mental status.    History gathered per chart review EMS reports they had an axillary temperature of 88, and were unable to get an oxygen saturation or blood pressure at the time of arrival on scene.  Blood glucose was in the low 100s.  It was reported that the patient was awake and breathing on his own but unable to answer questions or following commands.  Intermittently agitated. Per EMS, father told him that if he wanted to come inside he would have to crawl inside. On arrival to the home, it was reported he was found outside naked with various abrasions scattered throughout his body after attempting to crawl inside.  It is unknown how long he was outside. The patient did not make it inside. His father found him and called 911.   The father states that the last time he saw patient was around suppertime, around 5 to 6 PM, stating he was normal at that time. He states that he woke up around 1:30 AM and noticed that his bedroom light was on and found the patient lying on the ground outside of their carport in the gravel driveway. States he was awake but could not talk much. When asked why he thought the patient was unable to talk father states that because he was cold. His father does not think that he was intoxicated but states that he does not know what medications the patient takes or if he has used any drugs or alcohol recently. It is reported the last time he notes that the patient was actively drinking was about 2 weeks ago. He states the patient did fall about 2  days ago because he loses his balance often. Denied any other known injury. No known recent fevers, cough, vomiting or diarrhea. He does not think that the patient had any toxic alcohols or illicit drugs but is not sure. He does report that the patient takes Anastacia powders regularly. Denies that the patient has had any recent suicidal thoughts and does not think that this was a suicide attempt.   ED course: Upon arrival to the ED, the patient was profoundly hypotensive requiring emergent central line placement for vasopressor support given difficulty with access. Although he was lethargic but awake on arrival, he did become more lethargic and agitated requiring sedation and ultimately was emergently intubated at bedside by the EDP for airway protection. He was put on the [facility] and started on warmed IVFs with heated packs to assist with severe hypothermia. He was found to have severe AGMA and was given 3 amps of bicarb followed by a bicarb gtt. Mild lactic acidosis iso sepsis but found to be + for salicylates. Poison control contacted and directed to start fomepizole  for potential toxic ethanol ingestion. Nephrology contacted with concern for the need of CRRT and plan to see him during the day.   Vitals on Arrival: temp: 85.4 F, BP 61/45, HR 82, RR 16, SpO2 78%  Pertinent Labs/Diagnostics: Found to be with profound AG metabolic acidosis and lactic acidosis iso of acute renal failure,  rhabdomyolysis and with a supratherapeutic INR.  LILLETTE Robet Kim, PA-C personally viewed and interpreted this ECG. EKG Interpretation: Date: 05/03/24, EKG Time: 0320, Rate: 102, Rhythm: irregularly irregular, QRS Duration:  131 Intervals: prolonged QT interval, ST/T Wave abnormalities: severe global ischemia without acute ST elevation, Narrative Interpretation: atrial fibrillation, PVCs, with severe global ischemia, hx of inferior STEMI  Chemistry: Na+:132, K+: 436, BUN/Cr.: 37/3.53, Serum CO2/ AG: 10/28, Mg: 3.1, Alk  Phos 128, AST/ALT: 3895/2503, CK 411, Beta Hydoxy: 0.93, Glucose: 260  Hematology: WBC: 9.3, Hgb: 12.7, Platelets 103, INR 3.5 Troponin: 32, Lactic/ PCT: 3.7 >> >9.0/ 63.20, COVID-19 & Influenza A/B: negative ABG: ph 6.95 pCO2 56, pO2 44, acid base deficit 20.9, bicarb 11.8  Ethanol: negative UDS: + benzos  CXR Impression :  1. Patchy airspace consolidation in the base of both lungs, consistent with pneumonia or aspiration. Mid and upper lungs remain clear. 2. Endotracheal tube and nasogastric tube are in satisfactory position.  CT Head Impression:  1. No acute intracranial abnormality. 2. Chronic microvascular ischemic disease in bilateral cerebral hemispheres deep and periventricular white matter. 3. Chronic left thalamic lacunar infarctions. 4. Atherosclerotic calcifications within the cavernous internal carotid and vertebral arteries.  CT C Spine Impression: 1. No evidence of fracture or acute traumatic injury. 2. Mild diffuse degenerative disc disease throughout the cervical spine.  CT Chest/Abd/Pelvis Impression: 1. Endotracheal tube tip in the proximal right mainstem bronchus; recommend retraction/repositioning. 2. Streaky dependent lower lobe atelectasis/consolidation bilaterally with scattered patchy reticular opacities in the right upper lobe. 3. Healing/healed fractures of the right anterior/lateral 4th through 9th ribs; no evidence of acute traumatic injury. 4. Cardiomegaly with moderate calcific coronary artery disease. 5. Hepatic steatosis and small exophytic right renal cysts. 6. Chronic mild-to-moderate L1 compression deformity and chronic bilateral L5 spondylolysis.  Medications Administered:  NaCl IVF Sodium bicarb amps Ativan  1mg  x 2 Tdap Thiamine  Sodium bicarb gtt 2g magnesium  Cefepime  Vanc Flagyl  Fentanyl  + propofol   PCCM consulted for admission due to respiratory failure requiring mechanical ventilation and circulatory shock requiring  vasopressors.  05/05/24- patient remains critically ill on MV.  Treating for pneumonia and antifreeze overdose. Ventilator management -weaned FiO2 on PRVC from 100 to 40%. SBT today. Unable to wean due to heavy mucus per ETT clogging circuit.   Pertinent Medical History  Polysubstance abuse - cocaine, opioids Hypertension Hyperlipidemia CAD Anxiety Insomnia Hx of MI 2013 PTSD Tobacco use  Significant Hospital Events: Including procedures, antibiotic start and stop dates in addition to other pertinent events   1/15: Admitted for circulatory shock s/t suspected sepsis, severe hypothermia and acute renal failure with hypoxic respiratory failure requiring mechanical ventilation and vasopressor support. Positive for salicylates, poison control contacted and recommended started fomepizole  after sending off for a toxic ethanol level. PCCM consulted for admission. 05/04/24- Remains critically ill on MV.  Ordered workup for ethylene glycol poisoning.  Repeat EKG.  RVP. Continue abx. Weaned to 40% PRVC    Objective    Blood pressure 135/75, pulse 88, temperature 100 F (37.8 C), resp. rate (!) 28, height 5' 8 (1.727 m), weight 103.9 kg, SpO2 97%.    Vent Mode: PRVC FiO2 (%):  [30 %-50 %] 50 % Set Rate:  [28 bmp] 28 bmp Vt Set:  [500 mL] 500 mL PEEP:  [5 cmH20] 5 cmH20 Plateau Pressure:  [22 cmH20] 22 cmH20   Intake/Output Summary (Last 24 hours) at 05/05/2024 0922 Last data filed at 05/05/2024 0600 Gross per 24 hour  Intake 7937.74 ml  Output 3700  ml  Net 4237.74 ml   Filed Weights   05/04/24 0326 05/05/24 0500  Weight: 101.2 kg 103.9 kg    Examination: General: Adult male, critically ill, lying in bed intubated & sedated requiring mechanical ventilation, NAD HENT: anicteric sclerae, atraumatic, normocephalic, neck supple, no JVD CV: irregularly irregular, S1 S2, atrial fibrillation on monitor, no r/m/g Pulm: Regular, non labored on the ventilator , breath sounds equal  bilaterally without wheezing, rhonchi or rales GI: soft, non-tender, mild distention, hypoactive bowels  Extremities: cool and dry, peripheral pulses present but thready, no significant edema noted Skin: multiple scattered abrasions on extremities, abdomen and back Neuro: intubated and sedated, not following commands, does not withdraw from pain, PERRL  GU: foley in place, with very minimal dark yellow urine    Assessment and Plan   #Acute Metabolic Encephalopathy #Polysubstance Abuse #Hypothermia Hx: Polysubstance Abuse, Insomnia, PTSD, Alcohol Abuse Head CT: negative for acute intracranial abnormality - Supportive care - PAD protocol: continue Fentanyl  & Propofol  - WUA once neurologic and respiratory parameters met - Continue Bair Hugger and warmed fluids as indicated - Multivitamin and thiamine  - May need phenobarbital taper in the future given history of ETOH - Consider lactulose if ammonia continues to rise - Encourage family at bedside - ICU delirium precautions - Seizure precautions - Hold home klonopin , seroquel  and gabapenin for now  #Acute Hypoxic Respiratory Failure #CAP vs Aspiration Pneumonia #COPD PMHx: COPD - Full vent support - Ventilator settings: PRVC FiO2 60%, 5 PEEP, RR 22, VT 500 - Lung protective strategies - Wean PEEP & FiO2 as tolerated, maintain SpO2 > 90% - Plateau pressures less than 30 cm H20  - Head of bed elevated 30 degrees - VAP protocol in place - Daily WUA with SBT as tolerated  - Ensure adequate pulmonary hygiene  - Duonebs prn - Consider steroids if wheezing becomes present - Intermittent chest x-ray & ABG PRN - Administered 2 amps bicarb and increased bicarb gtt to 200/hr - ABX: vanc and zosyn   #Circulatory Shock s/t Acute Renal Failure vs Sepsis #Elevated Troponin likely d/t Demand Ischemia #Atrial Fibrillation #Non-sustained Ventricular Tachycardia - Continuous cardiac monitoring - Vasopressors to maintain MAP goal > 65 -  Currently on levophed  with weaning requirements - Vasopressin  ordered but not yet started - Received 2g IV Magnesium  - Will hold off on amiodarone given no sustained VT - EKG as indicated - Trend lactic and PCT - Lactic on arrival 3.7 >> now >9 - PCT 63 on arrival - Trend troponin - ECHO pending - Hold home losartan , metoprolol  and plavix  for now  #Severe AGMA with + salicylates #Lactic Acidosis #Acute Renal Failure + Uremia #Rhabdomyolysis - Strict I/O's; inform provider if UO is <0.5/hr - Trend BMP, Mg and Phos - Avoid nephrotoxins as able - Ensure adequate renal perfusion - ICU electrolyte replacement protocol as indicated - Poison control contacted and recommend repeat salicylate - Appreciate recommendations per Poison Control - Continue fomepizole  for suspected toxic alcohol ingestion - Continue IVFs and trend CK  - Likely will need CRRT - Nephrology consulted; appreciate input  #Transaminitis s/t Shock Liver #Hyperammonemia #Supratherapeutic INR - Trend hepatic function - AST/ALT: 3895/2503 on admission - Ammonia 40; consider lactulose if worsening - Diet: NPO - Constipation protocol prn - GI prophylaxis: Famotidine  20mg  per tube - Continue IVFs and trend CK - Given INR of 3.5, consider Vitamin K  #Thrombocytopenia - Trend CBC - Transfuse if Hgb < 7 or platelets < 7 - Monitor for s/sx of bleeding -  VTE prophylaxis: SQ heparin , SCDs  #Type II Diabetes Mellitus - ICU hypo/hyperglycemia protocol - SSI - CBG Q4h - Goal range 140-180  #Suspected Sepsis with unknown etiology - CAP vs Aspiration Pneumonia Initial interventions: IVFs + flagly + vanc + cefepime  - Trend WBC and monitor fever curve - Blood cultures pending - Respiratory PCR: negative - Respiratory 20 pathogen panel ordered - Broad spectrum ABX: zosyn  and vanc - Narrow pending culture and sensitivities  Labs   CBC: Recent Labs  Lab 05/04/24 0318 05/04/24 0518 05/05/24 0345  WBC 9.3 8.3  4.0  NEUTROABS 7.3  --   --   HGB 12.7* 13.2 11.8*  HCT 40.8 41.8 34.0*  MCV 102.3* 101.0* 92.9  PLT 103* 81* 87*    Basic Metabolic Panel: Recent Labs  Lab 05/04/24 0318 05/04/24 0518 05/04/24 1746 05/04/24 1747 05/05/24 0345  NA 132* 133*  --  140 138  K 4.6 5.4*  --  3.3* 3.1*  CL 94* 96*  --  100 99  CO2 10* 11*  --  28 30  GLUCOSE 260* 316*  --  134* 113*  BUN 37* 35*  --  28* 20  CREATININE 3.53* 2.88*  --  1.66* 1.27*  CALCIUM  8.3* 7.5*  --  6.7* 7.1*  MG 3.1* 3.4* 2.3  --  2.2  PHOS  --  8.2* 2.3*  --  1.7*   GFR: Estimated Creatinine Clearance: 70.5 mL/min (A) (by C-G formula based on SCr of 1.27 mg/dL (H)). Recent Labs  Lab 05/04/24 0318 05/04/24 0518 05/05/24 0345 05/05/24 0639  PROCALCITON 63.20  --   --   --   WBC 9.3 8.3 4.0  --   LATICACIDVEN 3.7* >9.0*  --  1.8    Liver Function Tests: Recent Labs  Lab 05/04/24 0318  AST 3,895*  ALT 2,503*  ALKPHOS 128*  BILITOT 1.0  PROT 5.9*  ALBUMIN 3.4*   No results for input(s): LIPASE, AMYLASE in the last 168 hours. Recent Labs  Lab 05/04/24 0318  AMMONIA 40*    ABG    Component Value Date/Time   PHART 7.54 (H) 05/05/2024 0730   PCO2ART 39 05/05/2024 0730   PO2ART 56 (L) 05/05/2024 0730   HCO3 33.3 (H) 05/05/2024 0730   ACIDBASEDEF 3.7 (H) 05/04/2024 0900   O2SAT 91.5 05/05/2024 0730     Coagulation Profile: Recent Labs  Lab 05/04/24 0433  INR 3.5*    Cardiac Enzymes: Recent Labs  Lab 05/04/24 0318 05/04/24 0938  CKTOTAL 411* 677*    HbA1C: Hgb A1c MFr Bld  Date/Time Value Ref Range Status  05/04/2024 05:18 AM 5.5 4.8 - 5.6 % Final    Comment:    (NOTE) Diagnosis of Diabetes The following HbA1c ranges recommended by the American Diabetes Association (ADA) may be used as an aid in the diagnosis of diabetes mellitus.  Hemoglobin             Suggested A1C NGSP%              Diagnosis  <5.7                   Non Diabetic  5.7-6.4                 Pre-Diabetic  >6.4                   Diabetic  <7.0  Glycemic control for                       adults with diabetes.    12/31/2020 11:28 AM 5.5 4.8 - 5.6 % Final    Comment:             Prediabetes: 5.7 - 6.4          Diabetes: >6.4          Glycemic control for adults with diabetes: <7.0     CBG: Recent Labs  Lab 05/04/24 1601 05/04/24 1943 05/04/24 2342 05/05/24 0354 05/05/24 0740  GLUCAP 182* 103* 105* 121* 108*    Review of Systems:   Unable to assess as patient is intubated and sedated.  Past Medical History:  He,  has a past medical history of Alcohol abuse, Anxiety, CA - skin cancer, CAD, residual CFX LAD disease (06/01/2011), Depression, Headache(784.0), Hyperlipemia, Hypertension, Insomnia, Mental disorder, MI (myocardial infarction) (HCC), Polysubstance abuse (HCC), and PTSD (post-traumatic stress disorder).   Surgical History:   Past Surgical History:  Procedure Laterality Date   CARDIAC CATHETERIZATION Bilateral 04/09/2015   Procedure: Coronary Angiogram;  Surgeon: Deatrice DELENA Cage, MD;  Location: [facility] INVASIVE CV LAB;  Service: Cardiovascular;  Laterality: Bilateral;   CARDIAC CATHETERIZATION [name] 04/09/2015   Procedure: Coronary Stent Intervention;  Surgeon: Deatrice DELENA Cage, MD;  Location: [facility] INVASIVE CV LAB;  Service: Cardiovascular;  Laterality: [name];   COLONOSCOPY WITH PROPOFOL  [name] 03/19/2019   Procedure: COLONOSCOPY WITH PROPOFOL ;  Surgeon: Unk Corinn Skiff, MD;  Location: [facility] ENDOSCOPY;  Service: Gastroenterology;  Laterality: [name];   LEFT HEART CATH Right 05/29/2011   Procedure: LEFT HEART CATH;  Surgeon: Alm LELON Clay, MD;  Location: [facility] CATH LAB;  Service: Cardiovascular;  Laterality: Right;   LEFT HEART CATHETERIZATION WITH CORONARY ANGIOGRAM [name] 06/01/2011   Procedure: LEFT HEART CATHETERIZATION WITH CORONARY ANGIOGRAM;  Surgeon: Alm LELON Clay, MD;  Location: [facility] CATH LAB;  Service: Cardiovascular;  Laterality: [name];  relook  cath, possible PCI   TONSILLECTOMY AND ADENOIDECTOMY       Social History:   reports that he has been smoking cigarettes. He has a 45 pack-year smoking history. He has never used smokeless tobacco. He reports that he does not drink alcohol and does not use drugs.   Family History:  His family history includes Anxiety disorder in his father and mother; Cancer in his mother; Chronic Renal Failure in his mother; Coronary artery disease (age of onset: 53) in his maternal grandfather; Depression in his father and mother; Other in his brother.   Allergies Allergies[1]   Home Medications  Prior to Admission medications  Medication Sig Start Date End Date Taking? Authorizing Provider  aspirin  81 MG EC tablet TAKE ONE TABLET BY MOUTH EVERY DAY 01/09/16   McGowan, Clotilda A, PA-C  clonazePAM  (KLONOPIN ) 1 MG tablet Take 1 tablet (1 mg total) by mouth 3 (three) times daily. 04/23/24     clopidogrel  (PLAVIX ) 75 MG tablet Take 1 tablet (75 mg total) by mouth daily. 05/23/23     doxepin  (SINEQUAN ) 25 MG capsule Take 1-2 capsules (25-50 mg total) by mouth at bedtime. 03/26/24     gabapentin  (NEURONTIN ) 300 MG capsule Take 1 capsule (300 mg total) by mouth 3 (three) times daily. 02/14/24     gabapentin  (NEURONTIN ) 300 MG capsule Take 2 capsules (600 mg total) by mouth 3 (three) times daily. 04/05/24     hydrOXYzine  (VISTARIL ) 25 MG capsule Take 1 capsule (  25 mg total) by mouth 2 (two) times daily. 03/12/24     hydrOXYzine  (VISTARIL ) 25 MG capsule Take 1 capsule (25 mg total) by mouth 2 (two) times daily. 04/23/24     lidocaine  (LIDODERM ) 5 % Place 1 patch onto the skin daily Apply patch to the most painful area for up to 12 hours in a 24 hour period. Patient taking differently: Place 1 patch onto the skin as directed. PRN 01/17/24     losartan  (COZAAR ) 100 MG tablet Take 1 tablet (100 mg total) by mouth daily. 10/17/23     losartan  (COZAAR ) 100 MG tablet Take 1 tablet (100 mg total) by mouth daily. 03/12/24      metFORMIN  (GLUCOPHAGE ) 500 MG tablet Take 1 tablet (500 mg total) by mouth daily with breakfast. 01/11/24     metoprolol  tartrate (LOPRESSOR ) 25 MG tablet Take 1 tablet (25 mg total) by mouth 2 (two) times daily. 05/23/23     QUEtiapine  (SEROQUEL ) 200 MG tablet Take 1 tablet (200 mg total) by mouth at bedtime as needed for insomnia. 01/18/24     QUEtiapine  (SEROQUEL ) 400 MG tablet Take 1 tablet (400 mg total) by mouth at bedtime. 02/01/24     QUEtiapine  (SEROQUEL ) 400 MG tablet Take 1 tablet (400 mg total) by mouth at bedtime 03/01/24     rosuvastatin  (CRESTOR ) 20 MG tablet Take 1 tablet (20 mg total) by mouth daily. 01/16/24     tirzepatide  (MOUNJARO ) 10 MG/0.5ML Pen Inject 10 mg into the skin once a week. 07/07/23     tirzepatide  (MOUNJARO ) 10 MG/0.5ML Pen Inject 10 mg into the skin once a week. 08/01/23     tirzepatide  (MOUNJARO ) 12.5 MG/0.5ML Pen Inject 12.5 mg into the skin once a week. 10/24/23     traMADol  (ULTRAM ) 50 MG tablet Take 1 tablet (50 mg total) by mouth 2 (two) times daily as needed. 12/14/23     traZODone  (DESYREL ) 150 MG tablet Take 1 tablet (150 mg total) by mouth at bedtime. 01/11/24     traZODone  (DESYREL ) 150 MG tablet Take 1 tablet (150 mg total) by mouth at bedtime. 03/12/24     umeclidinium-vilanterol (ANORO ELLIPTA ) 62.5-25 MCG/ACT AEPB Inhale 1 puff into the lungs daily. 02/08/24   Isadora Hose, MD  ARIPiprazole  (ABILIFY ) 2 MG tablet Take 1 tablet (2 mg total) by mouth once daily for 120 days 09/01/23 12/08/23       Critical care provider statement:   Total critical care time: 33 minutes   Performed by: Parris MD   Critical care time was exclusive of separately billable procedures and treating other patients.   Critical care was necessary to treat or prevent imminent or life-threatening deterioration.   Critical care was time spent personally by me on the following activities: development of treatment plan with patient and/or surrogate as well as nursing, discussions  with consultants, evaluation of patient's response to treatment, examination of patient, obtaining history from patient or surrogate, ordering and performing treatments and interventions, ordering and review of laboratory studies, ordering and review of radiographic studies, pulse oximetry and re-evaluation of patient's condition.    Kazuko Clemence, M.D.  Pulmonary & Critical Care Medicine               [1] No Known Allergies  "

## 2024-05-06 ENCOUNTER — Inpatient Hospital Stay: Payer: MEDICAID

## 2024-05-06 DIAGNOSIS — R4182 Altered mental status, unspecified: Secondary | ICD-10-CM | POA: Diagnosis not present

## 2024-05-06 DIAGNOSIS — J9600 Acute respiratory failure, unspecified whether with hypoxia or hypercapnia: Secondary | ICD-10-CM

## 2024-05-06 DIAGNOSIS — L8990 Pressure ulcer of unspecified site, unspecified stage: Secondary | ICD-10-CM | POA: Insufficient documentation

## 2024-05-06 LAB — RENAL FUNCTION PANEL
Albumin: 2.5 g/dL — ABNORMAL LOW (ref 3.5–5.0)
Anion gap: 9 (ref 5–15)
BUN: 11 mg/dL (ref 8–23)
CO2: 26 mmol/L (ref 22–32)
Calcium: 7.5 mg/dL — ABNORMAL LOW (ref 8.9–10.3)
Chloride: 101 mmol/L (ref 98–111)
Creatinine, Ser: 0.94 mg/dL (ref 0.61–1.24)
GFR, Estimated: >60 mL/min
Glucose, Bld: 103 mg/dL — ABNORMAL HIGH (ref 70–99)
Phosphorus: 1.5 mg/dL — ABNORMAL LOW (ref 2.5–4.6)
Potassium: 3.1 mmol/L — ABNORMAL LOW (ref 3.5–5.1)
Sodium: 136 mmol/L (ref 135–145)

## 2024-05-06 LAB — BLOOD GAS, ARTERIAL
Acid-base deficit: 3.6 mmol/L — ABNORMAL HIGH (ref 0.0–2.0)
Bicarbonate: 25.6 mmol/L (ref 20.0–28.0)
FIO2: 0.75 %
MECHVT: 500 mL
O2 Saturation: 98.4 %
PEEP: 5 cmH2O
Patient temperature: 38.8
RATE: 16 {breaths}/min
pCO2 arterial: 69 mmHg (ref 32–48)
pH, Arterial: 7.19 — CL (ref 7.35–7.45)
pO2, Arterial: 104 mmHg (ref 83–108)

## 2024-05-06 LAB — CBC WITH DIFFERENTIAL/PLATELET
Abs Immature Granulocytes: 0.05 K/uL (ref 0.00–0.07)
Basophils Absolute: 0 K/uL (ref 0.0–0.1)
Basophils Relative: 1 %
Eosinophils Absolute: 0.2 K/uL (ref 0.0–0.5)
Eosinophils Relative: 5 %
HCT: 36.5 % — ABNORMAL LOW (ref 39.0–52.0)
Hemoglobin: 12.1 g/dL — ABNORMAL LOW (ref 13.0–17.0)
Immature Granulocytes: 1 %
Lymphocytes Relative: 35 %
Lymphs Abs: 1.4 K/uL (ref 0.7–4.0)
MCH: 31.8 pg (ref 26.0–34.0)
MCHC: 33.2 g/dL (ref 30.0–36.0)
MCV: 96.1 fL (ref 80.0–100.0)
Monocytes Absolute: 0.7 K/uL (ref 0.1–1.0)
Monocytes Relative: 18 %
Neutro Abs: 1.5 K/uL — ABNORMAL LOW (ref 1.7–7.7)
Neutrophils Relative %: 40 %
Platelets: 74 K/uL — ABNORMAL LOW (ref 150–400)
RBC: 3.8 MIL/uL — ABNORMAL LOW (ref 4.22–5.81)
RDW: 16.6 % — ABNORMAL HIGH (ref 11.5–15.5)
WBC: 3.9 K/uL — ABNORMAL LOW (ref 4.0–10.5)
nRBC: 0 % (ref 0.0–0.2)

## 2024-05-06 LAB — GLUCOSE, CAPILLARY
Glucose-Capillary: 104 mg/dL — ABNORMAL HIGH (ref 70–99)
Glucose-Capillary: 114 mg/dL — ABNORMAL HIGH (ref 70–99)
Glucose-Capillary: 116 mg/dL — ABNORMAL HIGH (ref 70–99)
Glucose-Capillary: 121 mg/dL — ABNORMAL HIGH (ref 70–99)
Glucose-Capillary: 122 mg/dL — ABNORMAL HIGH (ref 70–99)
Glucose-Capillary: 134 mg/dL — ABNORMAL HIGH (ref 70–99)
Glucose-Capillary: 146 mg/dL — ABNORMAL HIGH (ref 70–99)
Glucose-Capillary: 150 mg/dL — ABNORMAL HIGH (ref 70–99)

## 2024-05-06 LAB — POTASSIUM: Potassium: 4.6 mmol/L (ref 3.5–5.1)

## 2024-05-06 LAB — MAGNESIUM: Magnesium: 2 mg/dL (ref 1.7–2.4)

## 2024-05-06 LAB — PHOSPHORUS: Phosphorus: 3.4 mg/dL (ref 2.5–4.6)

## 2024-05-06 MED ORDER — LORAZEPAM 1 MG PO TABS
0.0000 mg | ORAL_TABLET | ORAL | Status: DC
  Administered 2024-05-06: 3 mg via ORAL
  Filled 2024-05-06: qty 3

## 2024-05-06 MED ORDER — ETOMIDATE 2 MG/ML IV SOLN
INTRAVENOUS | Status: AC
  Filled 2024-05-06: qty 10

## 2024-05-06 MED ORDER — SODIUM CHLORIDE 0.9 % IV SOLN
10.0000 mg/kg | Freq: Two times a day (BID) | INTRAVENOUS | Status: DC
  Administered 2024-05-06 – 2024-05-07 (×3): 1010 mg via INTRAVENOUS
  Filled 2024-05-06 (×4): qty 1.01

## 2024-05-06 MED ORDER — ROCURONIUM BROMIDE 10 MG/ML (PF) SYRINGE
PREFILLED_SYRINGE | INTRAVENOUS | Status: AC
  Administered 2024-05-06: 100 mg via INTRAVENOUS
  Filled 2024-05-06: qty 10

## 2024-05-06 MED ORDER — ROCURONIUM BROMIDE 10 MG/ML (PF) SYRINGE: 100.0000 mg | PREFILLED_SYRINGE | Freq: Once | INTRAVENOUS | Status: AC

## 2024-05-06 MED ORDER — FOLIC ACID 1 MG PO TABS
1.0000 mg | ORAL_TABLET | Freq: Every day | ORAL | Status: DC
  Administered 2024-05-06 – 2024-05-12 (×7): 1 mg via ORAL
  Filled 2024-05-06 (×7): qty 1

## 2024-05-06 MED ORDER — LORAZEPAM 2 MG/ML IJ SOLN
1.0000 mg | INTRAMUSCULAR | Status: DC | PRN
  Administered 2024-05-06: 2 mg via INTRAVENOUS
  Filled 2024-05-06: qty 1

## 2024-05-06 MED ORDER — ETOMIDATE 2 MG/ML IV SOLN
20.0000 mg | Freq: Once | INTRAVENOUS | Status: AC
  Administered 2024-05-06: 20 mg via INTRAVENOUS

## 2024-05-06 MED ORDER — POTASSIUM CHLORIDE 10 MEQ/100ML IV SOLN
10.0000 meq | INTRAVENOUS | Status: DC
  Filled 2024-05-06 (×5): qty 100

## 2024-05-06 MED ORDER — DEXMEDETOMIDINE HCL IN NACL 400 MCG/100ML IV SOLN: 0.0000 ug/kg/h | INTRAVENOUS | Status: DC

## 2024-05-06 MED ORDER — THIAMINE HCL 100 MG/ML IJ SOLN
100.0000 mg | Freq: Every day | INTRAMUSCULAR | Status: DC
  Administered 2024-05-07: 100 mg via INTRAVENOUS
  Filled 2024-05-06: qty 2

## 2024-05-06 MED ORDER — POTASSIUM CHLORIDE 10 MEQ/100ML IV SOLN
10.0000 meq | INTRAVENOUS | Status: AC
  Administered 2024-05-06 (×4): 10 meq via INTRAVENOUS
  Filled 2024-05-06 (×4): qty 100

## 2024-05-06 MED ORDER — POTASSIUM CHLORIDE 10 MEQ/100ML IV SOLN
10.0000 meq | INTRAVENOUS | Status: AC
  Administered 2024-05-06 (×2): 10 meq via INTRAVENOUS
  Filled 2024-05-06: qty 100

## 2024-05-06 MED ORDER — METOPROLOL TARTRATE 5 MG/5ML IV SOLN
5.0000 mg | INTRAVENOUS | Status: DC | PRN
  Administered 2024-05-06 – 2024-05-09 (×3): 5 mg via INTRAVENOUS
  Filled 2024-05-06 (×3): qty 5

## 2024-05-06 MED ORDER — POTASSIUM CHLORIDE 20 MEQ PO PACK
40.0000 meq | PACK | Freq: Once | ORAL | Status: AC
  Administered 2024-05-06: 40 meq
  Filled 2024-05-06: qty 2

## 2024-05-06 MED ORDER — ADULT MULTIVITAMIN W/MINERALS CH
1.0000 | ORAL_TABLET | Freq: Every day | ORAL | Status: DC
  Administered 2024-05-06 – 2024-05-12 (×7): 1 via ORAL
  Filled 2024-05-06 (×7): qty 1

## 2024-05-06 MED ORDER — MIDAZOLAM HCL 2 MG/2ML IJ SOLN
INTRAMUSCULAR | Status: AC
  Administered 2024-05-06: 4 mg via INTRAVENOUS
  Filled 2024-05-06: qty 4

## 2024-05-06 MED ORDER — DEXMEDETOMIDINE HCL IN NACL 400 MCG/100ML IV SOLN
INTRAVENOUS | Status: AC
  Administered 2024-05-06: 0.4 ug/kg/h via INTRAVENOUS
  Filled 2024-05-06: qty 100

## 2024-05-06 MED ORDER — THIAMINE MONONITRATE 100 MG PO TABS
100.0000 mg | ORAL_TABLET | Freq: Every day | ORAL | Status: DC
  Administered 2024-05-06 – 2024-05-12 (×6): 100 mg via ORAL
  Filled 2024-05-06 (×6): qty 1

## 2024-05-06 MED ORDER — POTASSIUM PHOSPHATES 15 MMOLE/5ML IV SOLN
30.0000 mmol | Freq: Once | INTRAVENOUS | Status: AC
  Administered 2024-05-06: 30 mmol via INTRAVENOUS
  Filled 2024-05-06: qty 10

## 2024-05-06 MED ORDER — HYDRALAZINE HCL 20 MG/ML IJ SOLN
10.0000 mg | INTRAMUSCULAR | Status: DC | PRN
  Administered 2024-05-06 – 2024-05-12 (×4): 20 mg via INTRAVENOUS
  Filled 2024-05-06 (×4): qty 1

## 2024-05-06 MED ORDER — ETOMIDATE 2 MG/ML IV SOLN: 20.0000 mg | Freq: Once | INTRAVENOUS | Status: AC

## 2024-05-06 MED ORDER — METOPROLOL TARTRATE 5 MG/5ML IV SOLN
5.0000 mg | Freq: Once | INTRAVENOUS | Status: AC
  Administered 2024-05-06: 5 mg via INTRAVENOUS
  Filled 2024-05-06: qty 5

## 2024-05-06 MED ORDER — ACETAMINOPHEN 325 MG PO TABS
650.0000 mg | ORAL_TABLET | Freq: Four times a day (QID) | ORAL | Status: DC | PRN
  Administered 2024-05-06: 650 mg via ORAL
  Filled 2024-05-06: qty 2

## 2024-05-06 MED ORDER — POTASSIUM CHLORIDE 10 MEQ/100ML IV SOLN
10.0000 meq | INTRAVENOUS | Status: DC
  Filled 2024-05-06 (×4): qty 100

## 2024-05-06 MED ORDER — LORAZEPAM 1 MG PO TABS: 0.0000 mg | ORAL_TABLET | Freq: Three times a day (TID) | ORAL | Status: DC

## 2024-05-06 MED ORDER — LORAZEPAM 1 MG PO TABS: 1.0000 mg | ORAL_TABLET | ORAL | Status: DC | PRN

## 2024-05-06 MED ORDER — MIDAZOLAM HCL (PF) 2 MG/2ML IJ SOLN: 4.0000 mg | Freq: Once | INTRAMUSCULAR | Status: AC

## 2024-05-06 NOTE — Progress Notes (Signed)
 "  NAME:  Larry French, MRN:  [id number], DOB:  09/06/1960, LOS: 2 ADMISSION DATE:  05/04/2024, CONSULTATION DATE:  05/03/24 REFERRING MD:  Dr. Neomi, CHIEF COMPLAINT:  AMS   History of Present Illness:  Larry French is a 64 y.o. male with history of COPD, alcohol and benzodiazepine abuse, hypertension, hyperlipidemia and CAD who presented to the [facility] ED after he was found down outside of his father's house with altered mental status.    History gathered per chart review EMS reports they had an axillary temperature of 88, and were unable to get an oxygen saturation or blood pressure at the time of arrival on scene.  Blood glucose was in the low 100s.  It was reported that the patient was awake and breathing on his own but unable to answer questions or following commands.  Intermittently agitated. Per EMS, father told him that if he wanted to come inside he would have to crawl inside. On arrival to the home, it was reported he was found outside naked with various abrasions scattered throughout his body after attempting to crawl inside.  It is unknown how long he was outside. The patient did not make it inside. His father found him and called 911.   The father states that the last time he saw patient was around suppertime, around 5 to 6 PM, stating he was normal at that time. He states that he woke up around 1:30 AM and noticed that his bedroom light was on and found the patient lying on the ground outside of their carport in the gravel driveway. States he was awake but could not talk much. When asked why he thought the patient was unable to talk father states that because he was cold. His father does not think that he was intoxicated but states that he does not know what medications the patient takes or if he has used any drugs or alcohol recently. It is reported the last time he notes that the patient was actively drinking was about 2 weeks ago. He states the patient did fall about 2  days ago because he loses his balance often. Denied any other known injury. No known recent fevers, cough, vomiting or diarrhea. He does not think that the patient had any toxic alcohols or illicit drugs but is not sure. He does report that the patient takes Anastacia powders regularly. Denies that the patient has had any recent suicidal thoughts and does not think that this was a suicide attempt.   ED course: Upon arrival to the ED, the patient was profoundly hypotensive requiring emergent central line placement for vasopressor support given difficulty with access. Although he was lethargic but awake on arrival, he did become more lethargic and agitated requiring sedation and ultimately was emergently intubated at bedside by the EDP for airway protection. He was put on the [facility] and started on warmed IVFs with heated packs to assist with severe hypothermia. He was found to have severe AGMA and was given 3 amps of bicarb followed by a bicarb gtt. Mild lactic acidosis iso sepsis but found to be + for salicylates. Poison control contacted and directed to start fomepizole  for potential toxic ethanol ingestion. Nephrology contacted with concern for the need of CRRT and plan to see him during the day.   Vitals on Arrival: temp: 85.4 F, BP 61/45, HR 82, RR 16, SpO2 78%  Pertinent Labs/Diagnostics: Found to be with profound AG metabolic acidosis and lactic acidosis iso of acute renal failure,  rhabdomyolysis and with a supratherapeutic INR.  LILLETTE Robet Kim, PA-C personally viewed and interpreted this ECG. EKG Interpretation: Date: 05/03/24, EKG Time: 0320, Rate: 102, Rhythm: irregularly irregular, QRS Duration:  131 Intervals: prolonged QT interval, ST/T Wave abnormalities: severe global ischemia without acute ST elevation, Narrative Interpretation: atrial fibrillation, PVCs, with severe global ischemia, hx of inferior STEMI  Chemistry: Na+:132, K+: 436, BUN/Cr.: 37/3.53, Serum CO2/ AG: 10/28, Mg: 3.1, Alk  Phos 128, AST/ALT: 3895/2503, CK 411, Beta Hydoxy: 0.93, Glucose: 260  Hematology: WBC: 9.3, Hgb: 12.7, Platelets 103, INR 3.5 Troponin: 32, Lactic/ PCT: 3.7 >> >9.0/ 63.20, COVID-19 & Influenza A/B: negative ABG: ph 6.95 pCO2 56, pO2 44, acid base deficit 20.9, bicarb 11.8  Ethanol: negative UDS: + benzos  CXR Impression :  1. Patchy airspace consolidation in the base of both lungs, consistent with pneumonia or aspiration. Mid and upper lungs remain clear. 2. Endotracheal tube and nasogastric tube are in satisfactory position.  CT Head Impression:  1. No acute intracranial abnormality. 2. Chronic microvascular ischemic disease in bilateral cerebral hemispheres deep and periventricular white matter. 3. Chronic left thalamic lacunar infarctions. 4. Atherosclerotic calcifications within the cavernous internal carotid and vertebral arteries.  CT C Spine Impression: 1. No evidence of fracture or acute traumatic injury. 2. Mild diffuse degenerative disc disease throughout the cervical spine.  CT Chest/Abd/Pelvis Impression: 1. Endotracheal tube tip in the proximal right mainstem bronchus; recommend retraction/repositioning. 2. Streaky dependent lower lobe atelectasis/consolidation bilaterally with scattered patchy reticular opacities in the right upper lobe. 3. Healing/healed fractures of the right anterior/lateral 4th through 9th ribs; no evidence of acute traumatic injury. 4. Cardiomegaly with moderate calcific coronary artery disease. 5. Hepatic steatosis and small exophytic right renal cysts. 6. Chronic mild-to-moderate L1 compression deformity and chronic bilateral L5 spondylolysis.  Medications Administered:  NaCl IVF Sodium bicarb amps Ativan  1mg  x 2 Tdap Thiamine  Sodium bicarb gtt 2g magnesium  Cefepime  Vanc Flagyl  Fentanyl  + propofol   PCCM consulted for admission due to respiratory failure requiring mechanical ventilation and circulatory shock requiring  vasopressors.  05/05/24- patient remains critically ill on MV.  Treating for pneumonia and antifreeze overdose. Ventilator management -weaned FiO2 on PRVC from 100 to 40%. SBT today. Unable to wean due to heavy mucus per ETT clogging circuit.  05/06/24- patient was performing SBT with sedation off he self extubated.  After appx 1 hour he was in severe resp distress and required re-intubation. Poison control is continuing to manage ethylene glycol poisoning and recommends continuing fomepizole .   Pertinent Medical History  Polysubstance abuse - cocaine, opioids Hypertension Hyperlipidemia CAD Anxiety Insomnia Hx of MI 2013 PTSD Tobacco use  Significant Hospital Events: Including procedures, antibiotic start and stop dates in addition to other pertinent events   1/15: Admitted for circulatory shock s/t suspected sepsis, severe hypothermia and acute renal failure with hypoxic respiratory failure requiring mechanical ventilation and vasopressor support. Positive for salicylates, poison control contacted and recommended started fomepizole  after sending off for a toxic ethanol level. PCCM consulted for admission. 05/04/24- Remains critically ill on MV.  Ordered workup for ethylene glycol poisoning.  Repeat EKG.  RVP. Continue abx. Weaned to 40% PRVC    Objective    Blood pressure (!) 174/88, pulse 89, temperature 98.8 F (37.1 C), resp. rate (!) 23, height 5' 8 (1.727 m), weight 104.2 kg, SpO2 98%.    Vent Mode: Spontaneous FiO2 (%):  [35 %] 35 % Set Rate:  [28 bmp] 28 bmp Vt Set:  [500 mL] 500 mL PEEP:  [  5 cmH20] 5 cmH20 Pressure Support:  [8 cmH20] 8 cmH20 Plateau Pressure:  [20 cmH20-22 cmH20] 20 cmH20   Intake/Output Summary (Last 24 hours) at 05/06/2024 9081 Last data filed at 05/06/2024 0801 Gross per 24 hour  Intake 2257.87 ml  Output 3290 ml  Net -1032.13 ml   Filed Weights   05/04/24 0326 05/05/24 0500 05/06/24 0418  Weight: 101.2 kg 103.9 kg 104.2 kg     Examination: General: Adult male, critically ill, lying in bed intubated & sedated requiring mechanical ventilation, NAD HENT: anicteric sclerae, atraumatic, normocephalic, neck supple, no JVD CV: irregularly irregular, S1 S2, atrial fibrillation on monitor, no r/m/g Pulm: Regular, non labored on the ventilator , breath sounds equal bilaterally without wheezing, rhonchi or rales GI: soft, non-tender, mild distention, hypoactive bowels  Extremities: cool and dry, peripheral pulses present but thready, no significant edema noted Skin: multiple scattered abrasions on extremities, abdomen and back Neuro: intubated and sedated, not following commands, does not withdraw from pain, PERRL  GU: foley in place, with very minimal dark yellow urine    Assessment and Plan   #Acute Metabolic Encephalopathy #Polysubstance Abuse #Hypothermia Hx: Polysubstance Abuse, Insomnia, PTSD, Alcohol Abuse Head CT: negative for acute intracranial abnormality - -s/p extubation - refeeding  #Acute Hypoxic Respiratory Failure #CAP vs Aspiration Pneumonia #COPD PMHx: COPD   #Circulatory Shock s/t Acute Renal Failure vs Sepsis #Elevated Troponin likely d/t Demand Ischemia #Atrial Fibrillation #Non-sustained Ventricular Tachycardia   #Severe AGMA with + salicylates #Lactic Acidosis #Acute Renal Failure + Uremia #Rhabdomyolysis - Strict I/O's; inform provider if UO is <0.5/hr - Trend BMP, Mg and Phos - Avoid nephrotoxins as able - Ensure adequate renal perfusion - ICU electrolyte replacement protocol as indicated - Poison control contacted and recommend repeat salicylate - Appreciate recommendations per Poison Control - Continue fomepizole  for suspected toxic alcohol ingestion - Continue IVFs and trend CK  - Likely will need CRRT - Nephrology consulted; appreciate input  #Transaminitis s/t Shock Liver #Hyperammonemia #Supratherapeutic INR - Trend hepatic function - AST/ALT: 3895/2503 on  admission - Ammonia 40; consider lactulose if worsening - Diet: NPO - Constipation protocol prn - GI prophylaxis: Famotidine  20mg  per tube - Continue IVFs and trend CK - Given INR of 3.5, consider Vitamin K  #Thrombocytopenia - Trend CBC - Transfuse if Hgb < 7 or platelets < 7 - Monitor for s/sx of bleeding - VTE prophylaxis: SQ heparin , SCDs  #Type II Diabetes Mellitus - ICU hypo/hyperglycemia protocol - SSI - CBG Q4h - Goal range 140-180  #Suspected Sepsis with unknown etiology - CAP vs Aspiration Pneumonia Initial interventions: IVFs + flagly + vanc + cefepime  - Trend WBC and monitor fever curve - Blood cultures pending - Respiratory PCR: negative - Respiratory 20 pathogen panel ordered - Broad spectrum ABX: zosyn  and vanc - Narrow pending culture and sensitivities  Labs   CBC: Recent Labs  Lab 05/04/24 0318 05/04/24 0518 05/05/24 0345 05/06/24 0334  WBC 9.3 8.3 4.0 3.9*  NEUTROABS 7.3  --   --  1.5*  HGB 12.7* 13.2 11.8* 12.1*  HCT 40.8 41.8 34.0* 36.5*  MCV 102.3* 101.0* 92.9 96.1  PLT 103* 81* 87* 74*    Basic Metabolic Panel: Recent Labs  Lab 05/04/24 0318 05/04/24 0518 05/04/24 1746 05/04/24 1747 05/05/24 0345 05/05/24 2221 05/06/24 0334  NA 132* 133*  --  140 138  --  136  K 4.6 5.4*  --  3.3* 3.1* 3.3* 3.1*  CL 94* 96*  --  100 99  --  101  CO2 10* 11*  --  28 30  --  26  GLUCOSE 260* 316*  --  134* 113*  --  103*  BUN 37* 35*  --  28* 20  --  11  CREATININE 3.53* 2.88*  --  1.66* 1.27*  --  0.94  CALCIUM  8.3* 7.5*  --  6.7* 7.1*  --  7.5*  MG 3.1* 3.4* 2.3  --  2.2  --  2.0  PHOS  --  8.2* 2.3*  --  1.7* 1.8* 1.5*   GFR: Estimated Creatinine Clearance: 95.3 mL/min (by C-G formula based on SCr of 0.94 mg/dL). Recent Labs  Lab 05/04/24 0318 05/04/24 0518 05/05/24 0345 05/05/24 0639 05/06/24 0334  PROCALCITON 63.20  --   --   --   --   WBC 9.3 8.3 4.0  --  3.9*  LATICACIDVEN 3.7* >9.0*  --  1.8  --     Liver Function  Tests: Recent Labs  Lab 05/04/24 0318 05/05/24 0332 05/06/24 0334  AST 3,895* 1,420*  --   ALT 2,503* 1,398*  --   ALKPHOS 128* 102  --   BILITOT 1.0 1.2  --   PROT 5.9* 4.8*  --   ALBUMIN 3.4* 2.5* 2.5*   No results for input(s): LIPASE, AMYLASE in the last 168 hours. Recent Labs  Lab 05/04/24 0318  AMMONIA 40*    ABG    Component Value Date/Time   PHART 7.54 (H) 05/05/2024 0730   PCO2ART 39 05/05/2024 0730   PO2ART 56 (L) 05/05/2024 0730   HCO3 33.3 (H) 05/05/2024 0730   ACIDBASEDEF 3.7 (H) 05/04/2024 0900   O2SAT 91.5 05/05/2024 0730     Coagulation Profile: Recent Labs  Lab 05/04/24 0433  INR 3.5*    Cardiac Enzymes: Recent Labs  Lab 05/04/24 0318 05/04/24 0938 05/05/24 0332  CKTOTAL 411* 677* 999*    HbA1C: Hgb A1c MFr Bld  Date/Time Value Ref Range Status  05/04/2024 05:18 AM 5.5 4.8 - 5.6 % Final    Comment:    (NOTE) Diagnosis of Diabetes The following HbA1c ranges recommended by the American Diabetes Association (ADA) may be used as an aid in the diagnosis of diabetes mellitus.  Hemoglobin             Suggested A1C NGSP%              Diagnosis  <5.7                   Non Diabetic  5.7-6.4                Pre-Diabetic  >6.4                   Diabetic  <7.0                   Glycemic control for                       adults with diabetes.    12/31/2020 11:28 AM 5.5 4.8 - 5.6 % Final    Comment:             Prediabetes: 5.7 - 6.4          Diabetes: >6.4          Glycemic control for adults with diabetes: <7.0     CBG: Recent Labs  Lab 05/05/24 1603 05/05/24 1927 05/06/24 0016 05/06/24 0346 05/06/24 0739  GLUCAP  106* 102* 134* 104* 121*    Review of Systems:   Unable to assess as patient is intubated and sedated.  Past Medical History:  He,  has a past medical history of Alcohol abuse, Anxiety, CA - skin cancer, CAD, residual CFX LAD disease (06/01/2011), Depression, Headache(784.0), Hyperlipemia, Hypertension,  Insomnia, Mental disorder, MI (myocardial infarction) (HCC), Polysubstance abuse (HCC), and PTSD (post-traumatic stress disorder).   Surgical History:   Past Surgical History:  Procedure Laterality Date   CARDIAC CATHETERIZATION Bilateral 04/09/2015   Procedure: Coronary Angiogram;  Surgeon: Deatrice DELENA Cage, MD;  Location: [facility] INVASIVE CV LAB;  Service: Cardiovascular;  Laterality: Bilateral;   CARDIAC CATHETERIZATION [name] 04/09/2015   Procedure: Coronary Stent Intervention;  Surgeon: Deatrice DELENA Cage, MD;  Location: [facility] INVASIVE CV LAB;  Service: Cardiovascular;  Laterality: [name];   COLONOSCOPY WITH PROPOFOL  [name] 03/19/2019   Procedure: COLONOSCOPY WITH PROPOFOL ;  Surgeon: Unk Corinn Skiff, MD;  Location: [facility] ENDOSCOPY;  Service: Gastroenterology;  Laterality: [name];   LEFT HEART CATH Right 05/29/2011   Procedure: LEFT HEART CATH;  Surgeon: Alm LELON Clay, MD;  Location: [facility] CATH LAB;  Service: Cardiovascular;  Laterality: Right;   LEFT HEART CATHETERIZATION WITH CORONARY ANGIOGRAM [name] 06/01/2011   Procedure: LEFT HEART CATHETERIZATION WITH CORONARY ANGIOGRAM;  Surgeon: Alm LELON Clay, MD;  Location: [facility] CATH LAB;  Service: Cardiovascular;  Laterality: [name];  relook cath, possible PCI   TONSILLECTOMY AND ADENOIDECTOMY       Social History:   reports that he has been smoking cigarettes. He has a 45 pack-year smoking history. He has never used smokeless tobacco. He reports that he does not drink alcohol and does not use drugs.   Family History:  His family history includes Anxiety disorder in his father and mother; Cancer in his mother; Chronic Renal Failure in his mother; Coronary artery disease (age of onset: 39) in his maternal grandfather; Depression in his father and mother; Other in his brother.   Allergies Allergies[1]   Home Medications  Prior to Admission medications  Medication Sig Start Date End Date Taking? Authorizing Provider  aspirin  81 MG EC tablet TAKE ONE TABLET BY MOUTH  EVERY DAY 01/09/16   McGowan, Clotilda A, PA-C  clonazePAM  (KLONOPIN ) 1 MG tablet Take 1 tablet (1 mg total) by mouth 3 (three) times daily. 04/23/24     clopidogrel  (PLAVIX ) 75 MG tablet Take 1 tablet (75 mg total) by mouth daily. 05/23/23     doxepin  (SINEQUAN ) 25 MG capsule Take 1-2 capsules (25-50 mg total) by mouth at bedtime. 03/26/24     gabapentin  (NEURONTIN ) 300 MG capsule Take 1 capsule (300 mg total) by mouth 3 (three) times daily. 02/14/24     gabapentin  (NEURONTIN ) 300 MG capsule Take 2 capsules (600 mg total) by mouth 3 (three) times daily. 04/05/24     hydrOXYzine  (VISTARIL ) 25 MG capsule Take 1 capsule (25 mg total) by mouth 2 (two) times daily. 03/12/24     hydrOXYzine  (VISTARIL ) 25 MG capsule Take 1 capsule (25 mg total) by mouth 2 (two) times daily. 04/23/24     lidocaine  (LIDODERM ) 5 % Place 1 patch onto the skin daily Apply patch to the most painful area for up to 12 hours in a 24 hour period. Patient taking differently: Place 1 patch onto the skin as directed. PRN 01/17/24     losartan  (COZAAR ) 100 MG tablet Take 1 tablet (100 mg total) by mouth daily. 10/17/23     losartan  (COZAAR ) 100 MG tablet Take 1  tablet (100 mg total) by mouth daily. 03/12/24     metFORMIN  (GLUCOPHAGE ) 500 MG tablet Take 1 tablet (500 mg total) by mouth daily with breakfast. 01/11/24     metoprolol  tartrate (LOPRESSOR ) 25 MG tablet Take 1 tablet (25 mg total) by mouth 2 (two) times daily. 05/23/23     QUEtiapine  (SEROQUEL ) 200 MG tablet Take 1 tablet (200 mg total) by mouth at bedtime as needed for insomnia. 01/18/24     QUEtiapine  (SEROQUEL ) 400 MG tablet Take 1 tablet (400 mg total) by mouth at bedtime. 02/01/24     QUEtiapine  (SEROQUEL ) 400 MG tablet Take 1 tablet (400 mg total) by mouth at bedtime 03/01/24     rosuvastatin  (CRESTOR ) 20 MG tablet Take 1 tablet (20 mg total) by mouth daily. 01/16/24     tirzepatide  (MOUNJARO ) 10 MG/0.5ML Pen Inject 10 mg into the skin once a week. 07/07/23     tirzepatide  (MOUNJARO )  10 MG/0.5ML Pen Inject 10 mg into the skin once a week. 08/01/23     tirzepatide  (MOUNJARO ) 12.5 MG/0.5ML Pen Inject 12.5 mg into the skin once a week. 10/24/23     traMADol  (ULTRAM ) 50 MG tablet Take 1 tablet (50 mg total) by mouth 2 (two) times daily as needed. 12/14/23     traZODone  (DESYREL ) 150 MG tablet Take 1 tablet (150 mg total) by mouth at bedtime. 01/11/24     traZODone  (DESYREL ) 150 MG tablet Take 1 tablet (150 mg total) by mouth at bedtime. 03/12/24     umeclidinium-vilanterol (ANORO ELLIPTA ) 62.5-25 MCG/ACT AEPB Inhale 1 puff into the lungs daily. 02/08/24   Isadora Hose, MD  ARIPiprazole  (ABILIFY ) 2 MG tablet Take 1 tablet (2 mg total) by mouth once daily for 120 days 09/01/23 12/08/23       Critical care provider statement:   Total critical care time: 33 minutes   Performed by: Parris MD   Critical care time was exclusive of separately billable procedures and treating other patients.   Critical care was necessary to treat or prevent imminent or life-threatening deterioration.   Critical care was time spent personally by me on the following activities: development of treatment plan with patient and/or surrogate as well as nursing, discussions with consultants, evaluation of patient's response to treatment, examination of patient, obtaining history from patient or surrogate, ordering and performing treatments and interventions, ordering and review of laboratory studies, ordering and review of radiographic studies, pulse oximetry and re-evaluation of patient's condition.    Shakthi Scipio, M.D.  Pulmonary & Critical Care Medicine                [1] No Known Allergies  "

## 2024-05-06 NOTE — TOC CM/SW Note (Signed)
 TOC consult received for substance abuse education/counseling. TOC does not provide counseling. Resources have been added to the AVS.

## 2024-05-06 NOTE — Consult Note (Signed)
 Pharmacy Consult Note - Electrolytes  Sodium (mmol/L)  Date Value  05/06/2024 136  02/11/2022 136  03/07/2014 143   Potassium (mmol/L)  Date Value  05/06/2024 3.1 (L)  03/07/2014 3.2 (L)   Magnesium  (mg/dL)  Date Value  98/81/7973 2.0  03/04/2014 2.3   Calcium  (mg/dL)  Date Value  98/81/7973 7.5 (L)   Calcium , Total (mg/dL)  Date Value  88/80/7984 7.4 (L)   Albumin (g/dL)  Date Value  98/81/7973 2.5 (L)  10/22/2020 4.2  03/07/2014 3.4   Phosphorus (mg/dL)  Date Value  98/81/7973 1.5 (L)    ASSESSMENT: 63 y.o. male with PMH including CAD/STEMI s/p DES (2012), alcohol abuse presents with hypothermia after being found unresponsive outside in cold. Pharmacy has been consulted to monitor and replace electrolytes. Patient's renal function is currently poor because he is in AKI.  Lab Results  Component Value Date   CREATININE 0.94 05/06/2024   CREATININE 1.27 (H) 05/05/2024   CREATININE 1.66 (H) 05/04/2024    mIVF: NS @ 150 mL/hr  Pertinent medications: n/a  Goal of Therapy: Electrolytes WNL  PLAN: Kphos IV 30 mmol x 1 ordered by medical team.  Kcl 10 mEq x 4 later in the evening. KCL 40 mEQ x 1. Despite giving K runs last night, the potassium hasn't improved.  F/u with AM labs.   Thank you for allowing pharmacy to be a part of this patients care.  Cathaleen Blanch, PharmD, BCPS Clinical Pharmacist 05/06/2024 7:13 AM

## 2024-05-06 NOTE — Progress Notes (Addendum)
 1418 PM verbal order given by Dr. Aleskerov for IV Metoprolol  5 mg Q3 PRN for heart rate >100 or SBP >160. 1240 PM: MD has been notified that the patient's SBP is in the 200's and heart rate in the 150.Hydralazine  and Metoprolol  have been given. Ativan  has been d/c and Precedex .  1220: The patient had to emergently be intubated as his oxygen started to drop in the 80's. Precedex  was not working and he started to turn blue. NP has intubated the patient with 8.0 ET tube and has placed an OG awaiting for KUB.  1145 AM: Precedex  order as the patient is very agitated and keeps trying to get out of bed. Aox2. 11AM: CIWA protocol initiated. Oral ativan  and PRN ativan  ordered and given.  1035 AM: the patient self extubated himself.  0900: The patient is Alert and is following directions from nurse and RT. 0830 AM: Propofol  and Fentanyl  have been turned off for wake up assessment:

## 2024-05-06 NOTE — Plan of Care (Signed)
" °  Problem: Education: Goal: Ability to describe self-care measures that may prevent or decrease complications (Diabetes Survival Skills Education) will improve Outcome: Not Progressing Goal: Individualized Educational Video(s) Outcome: Not Progressing   Problem: Coping: Goal: Ability to adjust to condition or change in health will improve Outcome: Not Progressing   Problem: Fluid Volume: Goal: Ability to maintain a balanced intake and output will improve Outcome: Not Progressing   Problem: Fluid Volume: Goal: Ability to maintain a balanced intake and output will improve Outcome: Not Progressing   Problem: Health Behavior/Discharge Planning: Goal: Ability to identify and utilize available resources and services will improve Outcome: Not Progressing Goal: Ability to manage health-related needs will improve Outcome: Not Progressing   Problem: Metabolic: Goal: Ability to maintain appropriate glucose levels will improve Outcome: Not Progressing   Problem: Nutritional: Goal: Maintenance of adequate nutrition will improve Outcome: Not Progressing Goal: Progress toward achieving an optimal weight will improve Outcome: Not Progressing   Problem: Skin Integrity: Goal: Risk for impaired skin integrity will decrease Outcome: Not Progressing   Problem: Tissue Perfusion: Goal: Adequacy of tissue perfusion will improve Outcome: Not Progressing   Problem: Education: Goal: Knowledge of General Education information will improve Description: Including pain rating scale, medication(s)/side effects and non-pharmacologic comfort measures Outcome: Not Progressing   Problem: Health Behavior/Discharge Planning: Goal: Ability to manage health-related needs will improve Outcome: Not Progressing   Problem: Clinical Measurements: Goal: Ability to maintain clinical measurements within normal limits will improve Outcome: Not Progressing Goal: Will remain free from infection Outcome: Not  Progressing Goal: Diagnostic test results will improve Outcome: Not Progressing Goal: Respiratory complications will improve Outcome: Not Progressing Goal: Cardiovascular complication will be avoided Outcome: Not Progressing   Problem: Activity: Goal: Risk for activity intolerance will decrease Outcome: Not Progressing   Problem: Nutrition: Goal: Adequate nutrition will be maintained Outcome: Not Progressing   Problem: Coping: Goal: Level of anxiety will decrease Outcome: Not Progressing   Problem: Elimination: Goal: Will not experience complications related to bowel motility Outcome: Not Progressing Goal: Will not experience complications related to urinary retention Outcome: Not Progressing   Problem: Pain Managment: Goal: General experience of comfort will improve and/or be controlled Outcome: Not Progressing   Problem: Safety: Goal: Ability to remain free from injury will improve Outcome: Not Progressing   Problem: Skin Integrity: Goal: Risk for impaired skin integrity will decrease Outcome: Not Progressing   "

## 2024-05-06 NOTE — Plan of Care (Signed)

## 2024-05-06 NOTE — Progress Notes (Signed)
 Pt self extubated, placed on 3lpm Victor, sats 95%, no stridor noted. Respiratory rate 20/min, HR 106/min

## 2024-05-06 NOTE — Procedures (Signed)
 Endotracheal Intubation: Patient required placement of an artificial airway secondary to acute hypoxic respiratory failure    Consent: Emergent.    Hand washing performed prior to starting the procedure.    Medications administered for sedation prior to procedure:  Versed  2 mg IV, 20 mg Etomidate  IV, Rocuronium  100 mg IV     A time out procedure was called and correct patient, name, & ID confirmed. Needed supplies and equipment were assembled and checked to include ETT, 10 ml syringe, Glidescope, Mac and Miller blades, suction, oxygen and bag mask valve, end tidal CO2 monitor.    Patient was positioned to align the mouth and pharynx to facilitate visualization of the glottis.    Heart rate, SpO2 and blood pressure was continuously monitored during the procedure. Pre-oxygenation was conducted prior to intubation and endotracheal tube was placed through the vocal cords into the trachea.       The artificial airway was placed under direct visualization via glidescope route using a 8 mm ETT on the first attempt.   ETT was secured at 24 cm.   Placement was confirmed by auscuitation of lungs with good breath sounds bilaterally and no stomach sounds.  Condensation was noted on endotracheal tube.   Pulse ox 92% CO2 detector in place with appropriate color change.    Complications: None .        Chest radiograph ordered and pending.   Lonell Moose, AGNP  Pulmonary/Critical Care Pager [phone] (please enter 7 digits) PCCM Consult Pager [phone] (please enter 7 digits)

## 2024-05-06 NOTE — Progress Notes (Signed)
 " [facility]  PROGRESS NOTE   Subjective:   Awake and alert.  Patient was found to be hypokalemic which is being supplemented.  Objective:  Vital signs: Blood pressure (!) 198/102, pulse (!) 105, temperature 99 F (37.2 C), resp. rate 19, height 5' 8 (1.727 m), weight 104.2 kg, SpO2 94%.  Intake/Output Summary (Last 24 hours) at 05/06/2024 1413 Last data filed at 05/06/2024 1017 Gross per 24 hour  Intake 1731.73 ml  Output 2740 ml  Net -1008.27 ml   Filed Weights   05/04/24 0326 05/05/24 0500 05/06/24 0418  Weight: 101.2 kg 103.9 kg 104.2 kg     Physical Exam: General:  No acute distress  Head:  Normocephalic, atraumatic. Moist oral mucosal membranes  Eyes:  Anicteric  Neck:  Supple  Lungs:   Clear to auscultation, normal effort  Heart:  S1S2 no rubs  Abdomen:   Soft, nontender, bowel sounds present  Extremities: 1+ peripheral edema.  Neurologic:  Awake, alert, following commands  Skin:  No lesions  Access:     Basic Metabolic Panel: Recent Labs  Lab 05/04/24 0318 05/04/24 0518 05/04/24 1746 05/04/24 1747 05/05/24 0345 05/05/24 2221 05/06/24 0334  NA 132* 133*  --  140 138  --  136  K 4.6 5.4*  --  3.3* 3.1* 3.3* 3.1*  CL 94* 96*  --  100 99  --  101  CO2 10* 11*  --  28 30  --  26  GLUCOSE 260* 316*  --  134* 113*  --  103*  BUN 37* 35*  --  28* 20  --  11  CREATININE 3.53* 2.88*  --  1.66* 1.27*  --  0.94  CALCIUM  8.3* 7.5*  --  6.7* 7.1*  --  7.5*  MG 3.1* 3.4* 2.3  --  2.2  --  2.0  PHOS  --  8.2* 2.3*  --  1.7* 1.8* 1.5*   GFR: Estimated Creatinine Clearance: 95.3 mL/min (by C-G formula based on SCr of 0.94 mg/dL).  Liver Function Tests: Recent Labs  Lab 05/04/24 0318 05/05/24 0332 05/06/24 0334  AST 3,895* 1,420*  --   ALT 2,503* 1,398*  --   ALKPHOS 128* 102  --   BILITOT 1.0 1.2  --   PROT 5.9* 4.8*  --   ALBUMIN 3.4* 2.5* 2.5*   No results for input(s): LIPASE, AMYLASE in the last 168 hours. Recent Labs  Lab  05/04/24 0318  AMMONIA 40*    CBC: Recent Labs  Lab 05/04/24 0318 05/04/24 0518 05/05/24 0345 05/06/24 0334  WBC 9.3 8.3 4.0 3.9*  NEUTROABS 7.3  --   --  1.5*  HGB 12.7* 13.2 11.8* 12.1*  HCT 40.8 41.8 34.0* 36.5*  MCV 102.3* 101.0* 92.9 96.1  PLT 103* 81* 87* 74*     HbA1C: Hgb A1c MFr Bld  Date/Time Value Ref Range Status  05/04/2024 05:18 AM 5.5 4.8 - 5.6 % Final    Comment:    (NOTE) Diagnosis of Diabetes The following HbA1c ranges recommended by the American Diabetes Association (ADA) may be used as an aid in the diagnosis of diabetes mellitus.  Hemoglobin             Suggested A1C NGSP%              Diagnosis  <5.7                   Non Diabetic  5.7-6.4  Pre-Diabetic  >6.4                   Diabetic  <7.0                   Glycemic control for                       adults with diabetes.    12/31/2020 11:28 AM 5.5 4.8 - 5.6 % Final    Comment:             Prediabetes: 5.7 - 6.4          Diabetes: >6.4          Glycemic control for adults with diabetes: <7.0     Urinalysis: Recent Labs    05/04/24 0322 05/04/24 1750  COLORURINE AMBER* AMBER*  LABSPEC 1.021 1.019  PHURINE 5.0 5.0  GLUCOSEU NEGATIVE NEGATIVE  HGBUR SMALL* LARGE*  BILIRUBINUR NEGATIVE NEGATIVE  KETONESUR NEGATIVE NEGATIVE  PROTEINUR 30* 30*  NITRITE NEGATIVE NEGATIVE  LEUKOCYTESUR NEGATIVE NEGATIVE      Imaging: DG Abd 1 View Result Date: 05/06/2024 EXAM: 1 VIEW XRAY OF THE ABDOMEN 05/06/2024 12:36:28 PM COMPARISON: 05/04/2024 CLINICAL HISTORY: Encounter for orogastric tube placement FINDINGS: LINES, TUBES AND DEVICES: Enteric tube in place with side port and tip in the gastric body. BOWEL: Nonobstructive bowel gas pattern. SOFT TISSUES: No abnormal calcifications. BONES: No acute fracture. LUNGS: Patchy opacities in the bilateral lungs and interstitial coarsening. IMPRESSION: 1. Enteric tube in place with side port and tip in the gastric body. Electronically  signed by: Waddell Calk MD 05/06/2024 01:09 PM EST RP Workstation: [id number]   DG Chest Port 1 View Result Date: 05/06/2024 EXAM: 1 VIEW(S) XRAY OF THE CHEST 05/06/2024 12:36:28 PM COMPARISON: 05/04/2024 CLINICAL HISTORY: Endotracheal tube present FINDINGS: LINES, TUBES AND DEVICES: Endotracheal tube unchanged in position. Enteric tube courses below diaphragm with tip obscured. LUNGS AND PLEURA: Possible small right pleural effusion. Interstitial prominence suggesting interstitial edema. No pneumothorax. HEART AND MEDIASTINUM: Unchanged cardiomegaly. BONES AND SOFT TISSUES: No acute osseous abnormality. No significant interval change. IMPRESSION: 1. Endotracheal tube unchanged in position, with enteric tube coursing below the diaphragm and tip obscured. 2. Possible small right pleural effusion. 3. Interstitial prominence suggesting interstitial edema. Electronically signed by: Norleen Boxer MD 05/06/2024 01:09 PM EST RP Workstation: [id number]   ECHOCARDIOGRAM COMPLETE Result Date: 05/05/2024    ECHOCARDIOGRAM REPORT   Patient Name:   Larry French Date of Exam: 05/05/2024 Medical Rec #:  [id number]            Height:       68.0 in Accession #:    [id number]           Weight:       229.1 lb Date of Birth:  07/31/1960            BSA:          2.165 m Patient Age:    64 years             BP:           119/65 mmHg Patient Gender: M                    HR:           96 bpm. Exam Location:  [facility] Procedure: 2D Echo, Color Doppler and Cardiac Doppler (Both Spectral and Color  Flow Doppler were utilized during procedure). Indications:     R57.9 Shock  History:         Patient has prior history of Echocardiogram examinations. CAD                  and Previous Myocardial Infarction; Risk Factors:Hypertension                  and Polysubstance Abuse.  Sonographer:     L. Thornton-Maynard, RDCS Referring Phys:  [id number] ROBET KIM Diagnosing Phys: Redell Cave MD IMPRESSIONS  1. Left ventricular  ejection fraction, by estimation, is 55 to 60%. The left ventricle has normal function. The left ventricle has no regional wall motion abnormalities. There is mild left ventricular hypertrophy. Left ventricular diastolic parameters were normal.  2. Right ventricular systolic function is normal. The right ventricular size is normal.  3. The mitral valve is normal in structure. No evidence of mitral valve regurgitation.  4. The aortic valve is calcified. Aortic valve regurgitation is mild. Mild aortic valve stenosis. Aortic valve mean gradient measures 11.4 mmHg.  5. The inferior vena cava is normal in size with greater than 50% respiratory variability, suggesting right atrial pressure of 3 mmHg. FINDINGS  Left Ventricle: Left ventricular ejection fraction, by estimation, is 55 to 60%. The left ventricle has normal function. The left ventricle has no regional wall motion abnormalities. Global longitudinal strain performed but not reported based on interpreter judgement due to suboptimal tracking. The left ventricular internal cavity size was normal in size. There is mild left ventricular hypertrophy. Left ventricular diastolic parameters were normal. Right Ventricle: The right ventricular size is normal. No increase in right ventricular wall thickness. Right ventricular systolic function is normal. Left Atrium: Left atrial size was normal in size. Right Atrium: Right atrial size was normal in size. Pericardium: There is no evidence of pericardial effusion. Mitral Valve: The mitral valve is normal in structure. No evidence of mitral valve regurgitation. Tricuspid Valve: The tricuspid valve is normal in structure. Tricuspid valve regurgitation is mild. Aortic Valve: The aortic valve is calcified. Aortic valve regurgitation is mild. Mild aortic stenosis is present. Aortic valve mean gradient measures 11.4 mmHg. Aortic valve peak gradient measures 21.3 mmHg. Aortic valve area, by VTI measures 1.99 cm. Pulmonic Valve: The  pulmonic valve was normal in structure. Pulmonic valve regurgitation is trivial. Aorta: The aortic root and ascending aorta are structurally normal, with no evidence of dilitation. Venous: The inferior vena cava is normal in size with greater than 50% respiratory variability, suggesting right atrial pressure of 3 mmHg. IAS/Shunts: No atrial level shunt detected by color flow Doppler.  LEFT VENTRICLE PLAX 2D LVIDd:         4.30 cm      Diastology LVIDs:         2.60 cm      LV e' medial:   8.16 cm/s LV PW:         1.20 cm      LV E/e' medial: 13.5 LV IVS:        1.40 cm LVOT diam:     1.90 cm LV SV:         66 LV SV Index:   30 LVOT Area:     2.84 cm LV IVRT:       71 msec  LV Volumes (MOD) LV vol d, MOD A2C: 96.9 ml LV vol d, MOD A4C: 102.0 ml LV vol s, MOD A2C: 44.7 ml LV vol  s, MOD A4C: 47.9 ml LV SV MOD A2C:     52.2 ml LV SV MOD A4C:     102.0 ml LV SV MOD BP:      52.0 ml RIGHT VENTRICLE             IVC RV Basal diam:  3.20 cm     IVC diam: 1.60 cm RV Mid diam:    2.20 cm RV S prime:     19.70 cm/s TAPSE (M-mode): 2.1 cm LEFT ATRIUM             Index        RIGHT ATRIUM           Index LA diam:        3.40 cm 1.57 cm/m   RA Area:     12.90 cm LA Vol (A2C):   50.7 ml 23.42 ml/m  RA Volume:   28.80 ml  13.30 ml/m LA Vol (A4C):   21.1 ml 9.75 ml/m LA Biplane Vol: 36.1 ml 16.67 ml/m  AORTIC VALVE                     PULMONIC VALVE AV Area (Vmax):    2.00 cm      PV Vmax:          1.34 m/s AV Area (Vmean):   1.89 cm      PV Peak grad:     7.2 mmHg AV Area (VTI):     1.99 cm      PR End Diast Vel: 7.18 msec AV Vmax:           230.80 cm/s AV Vmean:          153.200 cm/s AV VTI:            0.330 m AV Peak Grad:      21.3 mmHg AV Mean Grad:      11.4 mmHg LVOT Vmax:         163.00 cm/s LVOT Vmean:        102.000 cm/s LVOT VTI:          0.232 m LVOT/AV VTI ratio: 0.70  AORTA Ao Root diam: 3.20 cm Ao Asc diam:  3.70 cm MITRAL VALVE MV Area (PHT): 4.68 cm     SHUNTS MV Decel Time: 162 msec     Systemic VTI:   0.23 m MV E velocity: 110.00 cm/s  Systemic Diam: 1.90 cm MV A velocity: 105.00 cm/s MV E/A ratio:  1.05 Redell Cave MD Electronically signed by Redell Cave MD Signature Date/Time: 05/05/2024/3:45:47 PM    Final      Medications:    fentaNYL  infusion INTRAVENOUS 50 mcg/hr (05/06/24 1239)   fomepizole  (ANTIZOL ) 1,010 mg in sodium chloride  0.9 % 100 mL IVPB     norepinephrine  (LEVOPHED ) Adult infusion Stopped (05/05/24 1910)   piperacillin -tazobactam (ZOSYN )  IV 3.375 g (05/06/24 1323)   potassium chloride      propofol  (DIPRIVAN ) infusion 20 mcg/kg/min (05/06/24 1247)   vasopressin  Stopped (05/04/24 0747)    Chlorhexidine  Gluconate Cloth  6 each Topical Daily   etomidate        etomidate        famotidine   20 mg Per Tube BID   folic acid   1 mg Oral Daily   insulin  aspart  0-15 Units Subcutaneous Q4H   LORazepam   0-4 mg Oral Q4H   Followed by   NOREEN ON 05/08/2024] LORazepam   0-4 mg Oral Q8H   multivitamin  with minerals  1 tablet Oral Daily   mouth rinse  15 mL Mouth Rinse Q2H   polyethylene glycol  17 g Per Tube Daily   senna  1 tablet Per Tube BID   thiamine   100 mg Oral Daily   Or   thiamine   100 mg Intravenous Daily    Assessment/ Plan:     64 year old male with history of hypertension, coronary artery disease, PTSD, anxiety, history of polysubstance abuse now admitted with history of respiratory failure subsequently leading to ventilator support.  He was also given fomepizole .  He also has severe metabolic acidosis and rhabdomyolysis.   #1: Acute kidney injury: Patient had a creatinine of 3.53 on admission which has improved to 0.94 today.  Urine output has been excellent.  Acute kidney injury is most likely secondary to acute tubular necrosis.     #2: EtOH/transaminitis: Possibly secondary to EtOH abuse.  He was given fomepizole .   #3: Sepsis/pneumonia: Continue antibiotics with Flagyl , vancomycin  and cefepime .   #4: Vent dependent respiratory failure: Patient  self extubated.  .   #5: Hypotension/circulatory shock: Presently off of Levophed  and vasopressin .  #6: Hypokalemia: Potassium being supplemented at this time.  Labs and medications reviewed. Will continue to follow along with you.   LOS: 2 Pinkey Edman, MD [facility] 1/18/20262:13 PM  "

## 2024-05-07 ENCOUNTER — Other Ambulatory Visit: Payer: Self-pay

## 2024-05-07 LAB — RENAL FUNCTION PANEL
Albumin: 2.8 g/dL — ABNORMAL LOW (ref 3.5–5.0)
Anion gap: 11 (ref 5–15)
BUN: 10 mg/dL (ref 8–23)
CO2: 22 mmol/L (ref 22–32)
Calcium: 8.1 mg/dL — ABNORMAL LOW (ref 8.9–10.3)
Chloride: 104 mmol/L (ref 98–111)
Creatinine, Ser: 0.9 mg/dL (ref 0.61–1.24)
GFR, Estimated: >60 mL/min
Glucose, Bld: 101 mg/dL — ABNORMAL HIGH (ref 70–99)
Phosphorus: 2.7 mg/dL (ref 2.5–4.6)
Potassium: 5.4 mmol/L — ABNORMAL HIGH (ref 3.5–5.1)
Sodium: 137 mmol/L (ref 135–145)

## 2024-05-07 LAB — GLUCOSE, CAPILLARY
Glucose-Capillary: 263 mg/dL — ABNORMAL HIGH (ref 70–99)
Glucose-Capillary: 94 mg/dL (ref 70–99)
Glucose-Capillary: 103 mg/dL — ABNORMAL HIGH (ref 70–99)
Glucose-Capillary: 124 mg/dL — ABNORMAL HIGH (ref 70–99)
Glucose-Capillary: 140 mg/dL — ABNORMAL HIGH (ref 70–99)

## 2024-05-07 LAB — CBC WITH DIFFERENTIAL/PLATELET
Abs Immature Granulocytes: 0.06 K/uL (ref 0.00–0.07)
Basophils Absolute: 0 K/uL (ref 0.0–0.1)
Basophils Relative: 0 %
Eosinophils Absolute: 0.2 K/uL (ref 0.0–0.5)
Eosinophils Relative: 3 %
HCT: 31.8 % — ABNORMAL LOW (ref 39.0–52.0)
Hemoglobin: 10.8 g/dL — ABNORMAL LOW (ref 13.0–17.0)
Immature Granulocytes: 1 %
Lymphocytes Relative: 25 %
Lymphs Abs: 1.7 K/uL (ref 0.7–4.0)
MCH: 32 pg (ref 26.0–34.0)
MCHC: 34 g/dL (ref 30.0–36.0)
MCV: 94.1 fL (ref 80.0–100.0)
Monocytes Absolute: 1.2 K/uL — ABNORMAL HIGH (ref 0.1–1.0)
Monocytes Relative: 18 %
Neutro Abs: 3.6 K/uL (ref 1.7–7.7)
Neutrophils Relative %: 53 %
Platelets: 89 K/uL — ABNORMAL LOW (ref 150–400)
RBC: 3.38 MIL/uL — ABNORMAL LOW (ref 4.22–5.81)
RDW: 16.8 % — ABNORMAL HIGH (ref 11.5–15.5)
WBC: 6.7 K/uL (ref 4.0–10.5)
nRBC: 0 % (ref 0.0–0.2)

## 2024-05-07 LAB — CULTURE, RESPIRATORY W GRAM STAIN

## 2024-05-07 LAB — ETHYLENE GLYCOL: Ethylene Glycol Lvl: <5 mg/dL

## 2024-05-07 LAB — VOLATILES,BLD-ACETONE,ETHANOL,ISOPROP,METHANOL
Acetone, blood: <0.01 g/dL (ref 0.000–0.010)
Ethanol, blood: <0.01 g/dL (ref 0.000–0.010)
Isopropanol, blood: <0.01 g/dL (ref 0.000–0.010)
Methanol, blood: <0.01 g/dL (ref 0.000–0.010)

## 2024-05-07 LAB — MAGNESIUM: Magnesium: 2 mg/dL (ref 1.7–2.4)

## 2024-05-07 MED ORDER — JUVEN PO PACK
1.0000 | PACK | Freq: Two times a day (BID) | ORAL | Status: DC
  Administered 2024-05-08 – 2024-05-12 (×7): 1

## 2024-05-07 MED ORDER — MIDODRINE HCL 5 MG PO TABS
5.0000 mg | ORAL_TABLET | Freq: Three times a day (TID) | ORAL | Status: DC
  Administered 2024-05-07 (×2): 5 mg via ORAL
  Filled 2024-05-07 (×2): qty 1

## 2024-05-07 MED ORDER — SODIUM ZIRCONIUM CYCLOSILICATE 5 G PO PACK
10.0000 g | PACK | Freq: Once | ORAL | Status: AC
  Administered 2024-05-07: 10 g
  Filled 2024-05-07: qty 2

## 2024-05-07 MED ORDER — FUROSEMIDE 10 MG/ML IJ SOLN
20.0000 mg | Freq: Once | INTRAMUSCULAR | Status: AC
  Administered 2024-05-07: 20 mg via INTRAVENOUS
  Filled 2024-05-07: qty 2

## 2024-05-07 MED ORDER — VITAL HP 1.0 CAL PO LIQD
1000.0000 mL | ORAL | Status: DC
  Administered 2024-05-07 – 2024-05-11 (×6): 1000 mL

## 2024-05-07 MED ORDER — MIDAZOLAM HCL (PF) 2 MG/2ML IJ SOLN: 2.0000 mg | Freq: Once | INTRAMUSCULAR | Status: AC

## 2024-05-07 MED ORDER — GLYCOPYRROLATE 0.2 MG/ML IJ SOLN
0.1000 mg | Freq: Once | INTRAMUSCULAR | Status: AC
  Administered 2024-05-07: 0.1 mg via INTRAVENOUS
  Filled 2024-05-07: qty 1

## 2024-05-07 MED ORDER — B COMPLEX-C PO TABS
1.0000 | ORAL_TABLET | Freq: Every day | ORAL | Status: DC
  Administered 2024-05-08 – 2024-05-12 (×5): 1
  Filled 2024-05-07 (×5): qty 1

## 2024-05-07 MED ORDER — PROSOURCE TF20 ENFIT COMPATIBL EN LIQD
60.0000 mL | Freq: Three times a day (TID) | ENTERAL | Status: DC
  Administered 2024-05-07 – 2024-05-12 (×14): 60 mL
  Filled 2024-05-07: qty 60

## 2024-05-07 MED ORDER — MIDAZOLAM HCL 2 MG/2ML IJ SOLN
INTRAMUSCULAR | Status: AC
  Administered 2024-05-07: 2 mg via INTRAVENOUS
  Filled 2024-05-07: qty 2

## 2024-05-07 MED ORDER — QUETIAPINE FUMARATE 100 MG PO TABS
100.0000 mg | ORAL_TABLET | Freq: Two times a day (BID) | ORAL | Status: DC
  Administered 2024-05-07 – 2024-05-08 (×2): 100 mg
  Filled 2024-05-07 (×2): qty 1

## 2024-05-07 MED ORDER — FREE WATER
30.0000 mL | Status: DC
  Administered 2024-05-07 – 2024-05-11 (×21): 30 mL

## 2024-05-07 MED ORDER — CLONAZEPAM 0.5 MG PO TABS
1.0000 mg | ORAL_TABLET | Freq: Three times a day (TID) | ORAL | Status: DC
  Administered 2024-05-07 – 2024-05-12 (×15): 1 mg
  Filled 2024-05-07 (×15): qty 2

## 2024-05-07 MED ORDER — DEXMEDETOMIDINE HCL IN NACL 400 MCG/100ML IV SOLN
0.0000 ug/kg/h | INTRAVENOUS | Status: DC
  Administered 2024-05-07: 0.4 ug/kg/h via INTRAVENOUS
  Administered 2024-05-08: 1 ug/kg/h via INTRAVENOUS
  Administered 2024-05-08 – 2024-05-10 (×2): 0.4 ug/kg/h via INTRAVENOUS
  Filled 2024-05-07 (×3): qty 100

## 2024-05-07 MED FILL — Fentanyl Citrate-NaCl 0.9% IV Soln 2.5 MG/250ML: INTRAVENOUS | Qty: 250 | Status: AC

## 2024-05-07 NOTE — Plan of Care (Signed)
  Problem: Nutritional: Goal: Maintenance of adequate nutrition will improve Outcome: Progressing   Problem: Skin Integrity: Goal: Risk for impaired skin integrity will decrease Outcome: Progressing   Problem: Clinical Measurements: Goal: Ability to maintain clinical measurements within normal limits will improve Outcome: Progressing

## 2024-05-07 NOTE — Progress Notes (Signed)
 0930 Wake up assessment started. Precedex  infusion started with decrease in propofol  infusion. Fentanyl  infusion turned off. Patient safety mitts in place. 1000 Drips titrated outside parameters by verbal order. NP at bedside. Patient moving all extremities with purposeful movement, agitated, attempting to reach for ETT. Patient oxygen saturation dropped in to the 80's. Patient with thick, dark secretions from ETT. Patient suctioned, no improvement. WUA terminated. Sedation resumed. Patient given 2mg  versed , 50mcg of Fentanyl . Patient oxygen saturations returned to 98% on 40% ventilator.

## 2024-05-07 NOTE — Progress Notes (Signed)
 " [facility]  PROGRESS NOTE   Subjective:   Patient is currently intubated.  Foley in place.  ET tube and OG tube in place. He self extubated yesterday briefly for about 3 hours and then had to be reintubated. Sedated with propofol  and fentanyl . Creatinine 0.9 Urine output of 2 L.  Objective:  Vital signs: Blood pressure 136/73, pulse 87, temperature 99.5 F (37.5 C), temperature source Bladder, resp. rate 12, height 5' 8 (1.727 m), weight 99.4 kg, SpO2 96%.  Intake/Output Summary (Last 24 hours) at 05/07/2024 1038 Last data filed at 05/07/2024 1000 Gross per 24 hour  Intake 1395.15 ml  Output 1885 ml  Net -489.85 ml   Filed Weights   05/05/24 0500 05/06/24 0418 05/07/24 0356  Weight: 103.9 kg 104.2 kg 99.4 kg     Physical Exam: General:  No acute distress  Head:  Normocephalic, atraumatic. Moist oral mucosal membranes  Eyes:  Anicteric  Neck:  Supple  Lungs:  Ventilator assisted  Heart:  S1S2 no rubs  Abdomen:   Soft, nontender, bowel sounds present  Extremities: 1+ peripheral edema.  Neurologic: Sedated  Skin:  No lesions  Access:     Basic Metabolic Panel: Recent Labs  Lab 05/04/24 0518 05/04/24 1746 05/04/24 1747 05/05/24 0345 05/05/24 2221 05/06/24 0334 05/06/24 2020 05/07/24 0423  NA 133*  --  140 138  --  136  --  137  K 5.4*  --  3.3* 3.1* 3.3* 3.1* 4.6 5.4*  CL 96*  --  100 99  --  101  --  104  CO2 11*  --  28 30  --  26  --  22  GLUCOSE 316*  --  134* 113*  --  103*  --  101*  BUN 35*  --  28* 20  --  11  --  10  CREATININE 2.88*  --  1.66* 1.27*  --  0.94  --  0.90  CALCIUM  7.5*  --  6.7* 7.1*  --  7.5*  --  8.1*  MG 3.4* 2.3  --  2.2  --  2.0  --  2.0  PHOS 8.2* 2.3*  --  1.7* 1.8* 1.5* 3.4 2.7   GFR: Estimated Creatinine Clearance: 97.3 mL/min (by C-G formula based on SCr of 0.9 mg/dL).  Liver Function Tests: Recent Labs  Lab 05/04/24 0318 05/05/24 0332 05/06/24 0334 05/07/24 0423  AST 3,895* 1,420*  --   --   ALT  2,503* 1,398*  --   --   ALKPHOS 128* 102  --   --   BILITOT 1.0 1.2  --   --   PROT 5.9* 4.8*  --   --   ALBUMIN 3.4* 2.5* 2.5* 2.8*   No results for input(s): LIPASE, AMYLASE in the last 168 hours. Recent Labs  Lab 05/04/24 0318  AMMONIA 40*    CBC: Recent Labs  Lab 05/04/24 0318 05/04/24 0518 05/05/24 0345 05/06/24 0334 05/07/24 0423  WBC 9.3 8.3 4.0 3.9* 6.7  NEUTROABS 7.3  --   --  1.5* 3.6  HGB 12.7* 13.2 11.8* 12.1* 10.8*  HCT 40.8 41.8 34.0* 36.5* 31.8*  MCV 102.3* 101.0* 92.9 96.1 94.1  PLT 103* 81* 87* 74* 89*     HbA1C: Hgb A1c MFr Bld  Date/Time Value Ref Range Status  05/04/2024 05:18 AM 5.5 4.8 - 5.6 % Final    Comment:    (NOTE) Diagnosis of Diabetes The following HbA1c ranges recommended by the American Diabetes  Association (ADA) may be used as an aid in the diagnosis of diabetes mellitus.  Hemoglobin             Suggested A1C NGSP%              Diagnosis  <5.7                   Non Diabetic  5.7-6.4                Pre-Diabetic  >6.4                   Diabetic  <7.0                   Glycemic control for                       adults with diabetes.    12/31/2020 11:28 AM 5.5 4.8 - 5.6 % Final    Comment:             Prediabetes: 5.7 - 6.4          Diabetes: >6.4          Glycemic control for adults with diabetes: <7.0     Urinalysis: Recent Labs    05/04/24 1750  COLORURINE AMBER*  LABSPEC 1.019  PHURINE 5.0  GLUCOSEU NEGATIVE  HGBUR LARGE*  BILIRUBINUR NEGATIVE  KETONESUR NEGATIVE  PROTEINUR 30*  NITRITE NEGATIVE  LEUKOCYTESUR NEGATIVE      Imaging: DG Abd 1 View Result Date: 05/06/2024 EXAM: 1 VIEW XRAY OF THE ABDOMEN 05/06/2024 12:36:28 PM COMPARISON: 05/04/2024 CLINICAL HISTORY: Encounter for orogastric tube placement FINDINGS: LINES, TUBES AND DEVICES: Enteric tube in place with side port and tip in the gastric body. BOWEL: Nonobstructive bowel gas pattern. SOFT TISSUES: No abnormal calcifications. BONES: No  acute fracture. LUNGS: Patchy opacities in the bilateral lungs and interstitial coarsening. IMPRESSION: 1. Enteric tube in place with side port and tip in the gastric body. Electronically signed by: Waddell Calk MD 05/06/2024 01:09 PM EST RP Workstation: [id number]   DG Chest Port 1 View Result Date: 05/06/2024 EXAM: 1 VIEW(S) XRAY OF THE CHEST 05/06/2024 12:36:28 PM COMPARISON: 05/04/2024 CLINICAL HISTORY: Endotracheal tube present FINDINGS: LINES, TUBES AND DEVICES: Endotracheal tube unchanged in position. Enteric tube courses below diaphragm with tip obscured. LUNGS AND PLEURA: Possible small right pleural effusion. Interstitial prominence suggesting interstitial edema. No pneumothorax. HEART AND MEDIASTINUM: Unchanged cardiomegaly. BONES AND SOFT TISSUES: No acute osseous abnormality. No significant interval change. IMPRESSION: 1. Endotracheal tube unchanged in position, with enteric tube coursing below the diaphragm and tip obscured. 2. Possible small right pleural effusion. 3. Interstitial prominence suggesting interstitial edema. Electronically signed by: Norleen Boxer MD 05/06/2024 01:09 PM EST RP Workstation: [id number]     Medications:    dexmedetomidine  (PRECEDEX ) IV infusion 0.6 mcg/kg/hr (05/07/24 1000)   fentaNYL  infusion INTRAVENOUS Stopped (05/07/24 0855)   fomepizole  (ANTIZOL ) 1,010 mg in sodium chloride  0.9 % 100 mL IVPB Stopped (05/07/24 0419)   norepinephrine  (LEVOPHED ) Adult infusion 3 mcg/min (05/06/24 1839)   piperacillin -tazobactam (ZOSYN )  IV Stopped (05/07/24 0950)   propofol  (DIPRIVAN ) infusion 15 mcg/kg/min (05/07/24 1000)   vasopressin  Stopped (05/04/24 0747)    Chlorhexidine  Gluconate Cloth  6 each Topical Daily   famotidine   20 mg Per Tube BID   folic acid   1 mg Oral Daily   insulin  aspart  0-15 Units Subcutaneous Q4H   multivitamin with minerals  1 tablet Oral Daily  mouth rinse  15 mL Mouth Rinse Q2H   polyethylene glycol  17 g Per Tube Daily   senna  1  tablet Per Tube BID   thiamine   100 mg Oral Daily   Or   thiamine   100 mg Intravenous Daily    Assessment/ Plan:     64 year old male with history of hypertension, coronary artery disease, PTSD, anxiety, history of polysubstance abuse now admitted with history of respiratory failure subsequently leading to ventilator support.  He was also given fomepizole .  He also has severe metabolic acidosis and rhabdomyolysis.   #1: Acute kidney injury: Patient had a creatinine of 3.53 on admission which has improved to 0.90 today.  Urine output has been excellent.  Acute kidney injury is most likely secondary to acute tubular necrosis.     #2: EtOH/transaminitis: Possibly secondary to EtOH abuse.  He was given fomepizole .   #3: Sepsis/pneumonia: Currently on Zosyn  as directed by ICU team.   #4:  Acute respiratory failure: Patient self extubated.  Reintubated again.   #5: Hypotension/circulatory shock: Presently off of Levophed  and vasopressin .  #6: Hypokalemia: Potassium was supplemented yesterday but now hyperkalemic so he will get Lokelma .  Labs and medications reviewed. Renal function is back to baseline.  We will sign off.  Please reconsult as necessary.   LOS: 3 Saralee Stank, MD [facility] 1/19/202610:38 AM  "

## 2024-05-07 NOTE — Plan of Care (Signed)

## 2024-05-07 NOTE — Consult Note (Signed)
 Pharmacy Consult Note - Electrolytes  Sodium (mmol/L)  Date Value  05/07/2024 137  02/11/2022 136  03/07/2014 143   Potassium (mmol/L)  Date Value  05/07/2024 5.4 (H)  03/07/2014 3.2 (L)   Magnesium  (mg/dL)  Date Value  98/80/7973 2.0  03/04/2014 2.3   Calcium  (mg/dL)  Date Value  98/80/7973 8.1 (L)   Calcium , Total (mg/dL)  Date Value  88/80/7984 7.4 (L)   Albumin (g/dL)  Date Value  98/80/7973 2.8 (L)  10/22/2020 4.2  03/07/2014 3.4   Phosphorus (mg/dL)  Date Value  98/80/7973 2.7    ASSESSMENT: 64 y.o. male with PMH including CAD/STEMI s/p DES (2012), alcohol abuse presents with hypothermia after being found unresponsive outside in cold. Pharmacy has been consulted to monitor and replace electrolytes. Patient's renal function is currently poor because he is in AKI.  Lab Results  Component Value Date   CREATININE 0.90 05/07/2024   CREATININE 0.94 05/06/2024   CREATININE 1.27 (H) 05/05/2024    Goal of Therapy: Electrolytes WNL  PLAN: --furosemide  20 mg IV x 1 per MD --10 grams po sodium zirconium cyclosilicate  x 1 --F/u with AM labs.   Thank you for allowing pharmacy to be a part of this patients care.  Adriana Bolster, PharmD, BCPS Clinical Pharmacist 05/07/2024 7:49 AM

## 2024-05-07 NOTE — Progress Notes (Signed)
 2037 - Update provided to [facility]. Provider updated and to review lab results. Per Poison Control, can D/C Antizol  if labs are negative.   0122 - Verbal order received to D/C Antizol .

## 2024-05-07 NOTE — Consult Note (Signed)
 Pharmacy Consult Note - Electrolytes  Sodium (mmol/L)  Date Value  05/07/2024 137  02/11/2022 136  03/07/2014 143   Potassium (mmol/L)  Date Value  05/07/2024 5.4 (H)  03/07/2014 3.2 (L)   Magnesium  (mg/dL)  Date Value  98/80/7973 2.0  03/04/2014 2.3   Calcium  (mg/dL)  Date Value  98/80/7973 8.1 (L)   Calcium , Total (mg/dL)  Date Value  88/80/7984 7.4 (L)   Albumin (g/dL)  Date Value  98/80/7973 2.8 (L)  10/22/2020 4.2  03/07/2014 3.4   Phosphorus (mg/dL)  Date Value  98/80/7973 2.7    ASSESSMENT: 64 y.o. male with PMH including CAD/STEMI s/p DES (2012), alcohol abuse presents with hypothermia after being found unresponsive outside in cold. Pharmacy has been consulted to monitor and replace electrolytes. Patient's renal function is currently poor because he is in AKI.  Lab Results  Component Value Date   CREATININE 0.90 05/07/2024   CREATININE 0.94 05/06/2024   CREATININE 1.27 (H) 05/05/2024    mIVF: NS @ 150 mL/hr  Pertinent medications: n/a  Goal of Therapy: Electrolytes WNL  PLAN: Potassium 5.4 today following multiple repletions yesterday. Lokelma  x1 ordered No repletion indicated F/u with AM labs.   Thank you for allowing pharmacy to be a part of this patients care.  Belvie Macintosh, PharmD Candidate 05/07/2024 8:53 AM

## 2024-05-07 NOTE — Progress Notes (Signed)
 "  NAME:  Larry French, MRN:  [id number], DOB:  1960/08/16, LOS: 3 ADMISSION DATE:  05/04/2024, CONSULTATION DATE:  05/03/24 REFERRING MD:  Dr. Neomi, CHIEF COMPLAINT:  AMS   History of Present Illness:  Larry French is a 64 y.o. male with history of COPD, alcohol and benzodiazepine abuse, hypertension, hyperlipidemia and CAD who presented to the [facility] ED after he was found down outside of his father's house with altered mental status.    History gathered per chart review EMS reports they had an axillary temperature of 88, and were unable to get an oxygen saturation or blood pressure at the time of arrival on scene.  Blood glucose was in the low 100s.  It was reported that the patient was awake and breathing on his own but unable to answer questions or following commands.  Intermittently agitated. Per EMS, father told him that if he wanted to come inside he would have to crawl inside. On arrival to the home, it was reported he was found outside naked with various abrasions scattered throughout his body after attempting to crawl inside.  It is unknown how long he was outside. The patient did not make it inside. His father found him and called 911.   The father states that the last time he saw patient was around suppertime, around 5 to 6 PM, stating he was normal at that time. He states that he woke up around 1:30 AM and noticed that his bedroom light was on and found the patient lying on the ground outside of their carport in the gravel driveway. States he was awake but could not talk much. When asked why he thought the patient was unable to talk father states that because he was cold. His father does not think that he was intoxicated but states that he does not know what medications the patient takes or if he has used any drugs or alcohol recently. It is reported the last time he notes that the patient was actively drinking was about 2 weeks ago. He states the patient did fall about 2  days ago because he loses his balance often. Denied any other known injury. No known recent fevers, cough, vomiting or diarrhea. He does not think that the patient had any toxic alcohols or illicit drugs but is not sure. He does report that the patient takes Anastacia powders regularly. Denies that the patient has had any recent suicidal thoughts and does not think that this was a suicide attempt.   ED course: Upon arrival to the ED, the patient was profoundly hypotensive requiring emergent central line placement for vasopressor support given difficulty with access. Although he was lethargic but awake on arrival, he did become more lethargic and agitated requiring sedation and ultimately was emergently intubated at bedside by the EDP for airway protection. He was put on the [facility] and started on warmed IVFs with heated packs to assist with severe hypothermia. He was found to have severe AGMA and was given 3 amps of bicarb followed by a bicarb gtt. Mild lactic acidosis iso sepsis but found to be + for salicylates. Poison control contacted and directed to start fomepizole  for potential toxic ethanol ingestion. Nephrology contacted with concern for the need of CRRT and plan to see him during the day.   Vitals on Arrival: temp: 85.4 F, BP 61/45, HR 82, RR 16, SpO2 78%  Pertinent Labs/Diagnostics: Found to be with profound AG metabolic acidosis and lactic acidosis iso of acute renal failure,  rhabdomyolysis and with a supratherapeutic INR.  LILLETTE Robet Kim, PA-C personally viewed and interpreted this ECG. EKG Interpretation: Date: 05/03/24, EKG Time: 0320, Rate: 102, Rhythm: irregularly irregular, QRS Duration:  131 Intervals: prolonged QT interval, ST/T Wave abnormalities: severe global ischemia without acute ST elevation, Narrative Interpretation: atrial fibrillation, PVCs, with severe global ischemia, hx of inferior STEMI  Chemistry: Na+:132, K+: 436, BUN/Cr.: 37/3.53, Serum CO2/ AG: 10/28, Mg: 3.1, Alk  Phos 128, AST/ALT: 3895/2503, CK 411, Beta Hydoxy: 0.93, Glucose: 260  Hematology: WBC: 9.3, Hgb: 12.7, Platelets 103, INR 3.5 Troponin: 32, Lactic/ PCT: 3.7 >> >9.0/ 63.20, COVID-19 & Influenza A/B: negative ABG: ph 6.95 pCO2 56, pO2 44, acid base deficit 20.9, bicarb 11.8  Ethanol: negative UDS: + benzos  CXR Impression :  1. Patchy airspace consolidation in the base of both lungs, consistent with pneumonia or aspiration. Mid and upper lungs remain clear. 2. Endotracheal tube and nasogastric tube are in satisfactory position.  CT Head Impression:  1. No acute intracranial abnormality. 2. Chronic microvascular ischemic disease in bilateral cerebral hemispheres deep and periventricular white matter. 3. Chronic left thalamic lacunar infarctions. 4. Atherosclerotic calcifications within the cavernous internal carotid and vertebral arteries.  CT C Spine Impression: 1. No evidence of fracture or acute traumatic injury. 2. Mild diffuse degenerative disc disease throughout the cervical spine.  CT Chest/Abd/Pelvis Impression: 1. Endotracheal tube tip in the proximal right mainstem bronchus; recommend retraction/repositioning. 2. Streaky dependent lower lobe atelectasis/consolidation bilaterally with scattered patchy reticular opacities in the right upper lobe. 3. Healing/healed fractures of the right anterior/lateral 4th through 9th ribs; no evidence of acute traumatic injury. 4. Cardiomegaly with moderate calcific coronary artery disease. 5. Hepatic steatosis and small exophytic right renal cysts. 6. Chronic mild-to-moderate L1 compression deformity and chronic bilateral L5 spondylolysis.  Medications Administered:  NaCl IVF Sodium bicarb amps Ativan  1mg  x 2 Tdap Thiamine  Sodium bicarb gtt 2g magnesium  Cefepime  Vanc Flagyl  Fentanyl  + propofol   PCCM consulted for admission due to respiratory failure requiring mechanical ventilation and circulatory shock requiring  vasopressors.  05/05/24- patient remains critically ill on MV.  Treating for pneumonia and antifreeze overdose. Ventilator management -weaned FiO2 on PRVC from 100 to 40%. SBT today. Unable to wean due to heavy mucus per ETT clogging circuit.  05/06/24- patient was performing SBT with sedation off he self extubated.  After appx 1 hour he was in severe resp distress and required re-intubation. Poison control is continuing to manage ethylene glycol poisoning and recommends continuing fomepizole .  05/07/24-patient on MV. Secretions are heavy,  BAL results no growth. He is still being treated for antifreeze poisoning.   Pertinent Medical History  Polysubstance abuse - cocaine, opioids Hypertension Hyperlipidemia CAD Anxiety Insomnia Hx of MI 2013 PTSD Tobacco use  Significant Hospital Events: Including procedures, antibiotic start and stop dates in addition to other pertinent events   1/15: Admitted for circulatory shock s/t suspected sepsis, severe hypothermia and acute renal failure with hypoxic respiratory failure requiring mechanical ventilation and vasopressor support. Positive for salicylates, poison control contacted and recommended started fomepizole  after sending off for a toxic ethanol level. PCCM consulted for admission. 05/04/24- Remains critically ill on MV.  Ordered workup for ethylene glycol poisoning.  Repeat EKG.  RVP. Continue abx. Weaned to 40% PRVC    Objective    Blood pressure 136/73, pulse 87, temperature 99.5 F (37.5 C), temperature source Bladder, resp. rate 12, height 5' 8 (1.727 m), weight 99.4 kg, SpO2 96%.    Vent Mode: PRVC FiO2 (%):  [  28 %-75 %] 28 % Set Rate:  [16 bmp-22 bmp] 22 bmp Vt Set:  [500 mL] 500 mL PEEP:  [5 cmH20] 5 cmH20 Plateau Pressure:  [18 cmH20-20 cmH20] 20 cmH20   Intake/Output Summary (Last 24 hours) at 05/07/2024 1057 Last data filed at 05/07/2024 1000 Gross per 24 hour  Intake 1395.15 ml  Output 1885 ml  Net -489.85 ml   Filed  Weights   05/05/24 0500 05/06/24 0418 05/07/24 0356  Weight: 103.9 kg 104.2 kg 99.4 kg    Examination: General: Adult male, critically ill, lying in bed intubated & sedated requiring mechanical ventilation, NAD HENT: anicteric sclerae, atraumatic, normocephalic, neck supple, no JVD CV: irregularly irregular, S1 S2, atrial fibrillation on monitor, no r/m/g Pulm: Regular, non labored on the ventilator , breath sounds equal bilaterally without wheezing, rhonchi or rales GI: soft, non-tender, mild distention, hypoactive bowels  Extremities: cool and dry, peripheral pulses present but thready, no significant edema noted Skin: multiple scattered abrasions on extremities, abdomen and back Neuro: intubated and sedated, not following commands, does not withdraw from pain, PERRL  GU: foley in place, with very minimal dark yellow urine    Assessment and Plan   #Acute Metabolic Encephalopathy #Polysubstance Abuse #Hypothermia Hx: Polysubstance Abuse, Insomnia, PTSD, Alcohol Abuse Head CT: negative for acute intracranial abnormality - -s/p extubation - refeeding  #Acute Hypoxic Respiratory Failure #CAP vs Aspiration Pneumonia #COPD PMHx: COPD #Circulatory Shock s/t Acute Renal Failure vs Sepsis #Elevated Troponin likely d/t Demand Ischemia #Atrial Fibrillation #Non-sustained Ventricular Tachycardia #Severe AGMA with + salicylates #Lactic Acidosis #Acute Renal Failure + Uremia #Rhabdomyolysis - Strict I/O's; inform provider if UO is <0.5/hr - Trend BMP, Mg and Phos - Avoid nephrotoxins as able - Ensure adequate renal perfusion - ICU electrolyte replacement protocol as indicated - Poison control contacted and recommend repeat salicylate - Appreciate recommendations per Poison Control - Continue fomepizole  for suspected toxic alcohol ingestion - Continue IVFs and trend CK  - Likely will need CRRT - Nephrology consulted; appreciate input  #Transaminitis s/t Shock  Liver #Hyperammonemia #Supratherapeutic INR - Trend hepatic function - AST/ALT: 3895/2503 on admission - Ammonia 40; consider lactulose if worsening - Diet: NPO - Constipation protocol prn - GI prophylaxis: Famotidine  20mg  per tube - Continue IVFs and trend CK - Given INR of 3.5, consider Vitamin K  #Thrombocytopenia - Trend CBC - Transfuse if Hgb < 7 or platelets < 7 - Monitor for s/sx of bleeding - VTE prophylaxis: SQ heparin , SCDs  #Type II Diabetes Mellitus - ICU hypo/hyperglycemia protocol - SSI - CBG Q4h - Goal range 140-180  #Suspected Sepsis with unknown etiology - CAP vs Aspiration Pneumonia Initial interventions: IVFs + flagly + vanc + cefepime  - Trend WBC and monitor fever curve - Blood cultures pending - Respiratory PCR: negative - Respiratory 20 pathogen panel ordered - Broad spectrum ABX: zosyn  and vanc - Narrow pending culture and sensitivities  Labs   CBC: Recent Labs  Lab 05/04/24 0318 05/04/24 0518 05/05/24 0345 05/06/24 0334 05/07/24 0423  WBC 9.3 8.3 4.0 3.9* 6.7  NEUTROABS 7.3  --   --  1.5* 3.6  HGB 12.7* 13.2 11.8* 12.1* 10.8*  HCT 40.8 41.8 34.0* 36.5* 31.8*  MCV 102.3* 101.0* 92.9 96.1 94.1  PLT 103* 81* 87* 74* 89*    Basic Metabolic Panel: Recent Labs  Lab 05/04/24 0518 05/04/24 1746 05/04/24 1747 05/05/24 0345 05/05/24 2221 05/06/24 0334 05/06/24 2020 05/07/24 0423  NA 133*  --  140 138  --  136  --  137  K 5.4*  --  3.3* 3.1* 3.3* 3.1* 4.6 5.4*  CL 96*  --  100 99  --  101  --  104  CO2 11*  --  28 30  --  26  --  22  GLUCOSE 316*  --  134* 113*  --  103*  --  101*  BUN 35*  --  28* 20  --  11  --  10  CREATININE 2.88*  --  1.66* 1.27*  --  0.94  --  0.90  CALCIUM  7.5*  --  6.7* 7.1*  --  7.5*  --  8.1*  MG 3.4* 2.3  --  2.2  --  2.0  --  2.0  PHOS 8.2* 2.3*  --  1.7* 1.8* 1.5* 3.4 2.7   GFR: Estimated Creatinine Clearance: 97.3 mL/min (by C-G formula based on SCr of 0.9 mg/dL). Recent Labs  Lab 05/04/24 0318  05/04/24 0518 05/05/24 0345 05/05/24 0639 05/06/24 0334 05/07/24 0423  PROCALCITON 63.20  --   --   --   --   --   WBC 9.3 8.3 4.0  --  3.9* 6.7  LATICACIDVEN 3.7* >9.0*  --  1.8  --   --     Liver Function Tests: Recent Labs  Lab 05/04/24 0318 05/05/24 0332 05/06/24 0334 05/07/24 0423  AST 3,895* 1,420*  --   --   ALT 2,503* 1,398*  --   --   ALKPHOS 128* 102  --   --   BILITOT 1.0 1.2  --   --   PROT 5.9* 4.8*  --   --   ALBUMIN 3.4* 2.5* 2.5* 2.8*   No results for input(s): LIPASE, AMYLASE in the last 168 hours. Recent Labs  Lab 05/04/24 0318  AMMONIA 40*    ABG    Component Value Date/Time   PHART 7.19 (LL) 05/06/2024 1300   PCO2ART 69 (HH) 05/06/2024 1300   PO2ART 104 05/06/2024 1300   HCO3 25.6 05/06/2024 1300   ACIDBASEDEF 3.6 (H) 05/06/2024 1300   O2SAT 98.4 05/06/2024 1300     Coagulation Profile: Recent Labs  Lab 05/04/24 0433  INR 3.5*    Cardiac Enzymes: Recent Labs  Lab 05/04/24 0318 05/04/24 0938 05/05/24 0332  CKTOTAL 411* 677* 999*    HbA1C: Hgb A1c MFr Bld  Date/Time Value Ref Range Status  05/04/2024 05:18 AM 5.5 4.8 - 5.6 % Final    Comment:    (NOTE) Diagnosis of Diabetes The following HbA1c ranges recommended by the American Diabetes Association (ADA) may be used as an aid in the diagnosis of diabetes mellitus.  Hemoglobin             Suggested A1C NGSP%              Diagnosis  <5.7                   Non Diabetic  5.7-6.4                Pre-Diabetic  >6.4                   Diabetic  <7.0                   Glycemic control for                       adults with diabetes.    12/31/2020 11:28 AM 5.5 4.8 -  5.6 % Final    Comment:             Prediabetes: 5.7 - 6.4          Diabetes: >6.4          Glycemic control for adults with diabetes: <7.0     CBG: Recent Labs  Lab 05/06/24 2004 05/06/24 2321 05/06/24 2325 05/07/24 0302 05/07/24 0732  GLUCAP 116* 122* 114* 94 103*    Review of Systems:    Unable to assess as patient is intubated and sedated.  Past Medical History:  He,  has a past medical history of Alcohol abuse, Anxiety, CA - skin cancer, CAD, residual CFX LAD disease (06/01/2011), Depression, Headache(784.0), Hyperlipemia, Hypertension, Insomnia, Mental disorder, MI (myocardial infarction) (HCC), Polysubstance abuse (HCC), and PTSD (post-traumatic stress disorder).   Surgical History:   Past Surgical History:  Procedure Laterality Date   CARDIAC CATHETERIZATION Bilateral 04/09/2015   Procedure: Coronary Angiogram;  Surgeon: Deatrice DELENA Cage, MD;  Location: [facility] INVASIVE CV LAB;  Service: Cardiovascular;  Laterality: Bilateral;   CARDIAC CATHETERIZATION [name] 04/09/2015   Procedure: Coronary Stent Intervention;  Surgeon: Deatrice DELENA Cage, MD;  Location: [facility] INVASIVE CV LAB;  Service: Cardiovascular;  Laterality: [name];   COLONOSCOPY WITH PROPOFOL  [name] 03/19/2019   Procedure: COLONOSCOPY WITH PROPOFOL ;  Surgeon: Unk Corinn Skiff, MD;  Location: [facility] ENDOSCOPY;  Service: Gastroenterology;  Laterality: [name];   LEFT HEART CATH Right 05/29/2011   Procedure: LEFT HEART CATH;  Surgeon: Alm LELON Clay, MD;  Location: [facility] CATH LAB;  Service: Cardiovascular;  Laterality: Right;   LEFT HEART CATHETERIZATION WITH CORONARY ANGIOGRAM [name] 06/01/2011   Procedure: LEFT HEART CATHETERIZATION WITH CORONARY ANGIOGRAM;  Surgeon: Alm LELON Clay, MD;  Location: [facility] CATH LAB;  Service: Cardiovascular;  Laterality: [name];  relook cath, possible PCI   TONSILLECTOMY AND ADENOIDECTOMY       Social History:   reports that he has been smoking cigarettes. He has a 45 pack-year smoking history. He has never used smokeless tobacco. He reports that he does not drink alcohol and does not use drugs.   Family History:  His family history includes Anxiety disorder in his father and mother; Cancer in his mother; Chronic Renal Failure in his mother; Coronary artery disease (age of onset: 81) in his maternal  grandfather; Depression in his father and mother; Other in his brother.   Allergies Allergies[1]   Home Medications  Prior to Admission medications  Medication Sig Start Date End Date Taking? Authorizing Provider  aspirin  81 MG EC tablet TAKE ONE TABLET BY MOUTH EVERY DAY 01/09/16   McGowan, Clotilda A, PA-C  clonazePAM  (KLONOPIN ) 1 MG tablet Take 1 tablet (1 mg total) by mouth 3 (three) times daily. 04/23/24     clopidogrel  (PLAVIX ) 75 MG tablet Take 1 tablet (75 mg total) by mouth daily. 05/23/23     doxepin  (SINEQUAN ) 25 MG capsule Take 1-2 capsules (25-50 mg total) by mouth at bedtime. 03/26/24     gabapentin  (NEURONTIN ) 300 MG capsule Take 1 capsule (300 mg total) by mouth 3 (three) times daily. 02/14/24     gabapentin  (NEURONTIN ) 300 MG capsule Take 2 capsules (600 mg total) by mouth 3 (three) times daily. 04/05/24     hydrOXYzine  (VISTARIL ) 25 MG capsule Take 1 capsule (25 mg total) by mouth 2 (two) times daily. 03/12/24     hydrOXYzine  (VISTARIL ) 25 MG capsule Take 1 capsule (25 mg total) by mouth 2 (two) times daily. 04/23/24  lidocaine  (LIDODERM ) 5 % Place 1 patch onto the skin daily Apply patch to the most painful area for up to 12 hours in a 24 hour period. Patient taking differently: Place 1 patch onto the skin as directed. PRN 01/17/24     losartan  (COZAAR ) 100 MG tablet Take 1 tablet (100 mg total) by mouth daily. 10/17/23     losartan  (COZAAR ) 100 MG tablet Take 1 tablet (100 mg total) by mouth daily. 03/12/24     metFORMIN  (GLUCOPHAGE ) 500 MG tablet Take 1 tablet (500 mg total) by mouth daily with breakfast. 01/11/24     metoprolol  tartrate (LOPRESSOR ) 25 MG tablet Take 1 tablet (25 mg total) by mouth 2 (two) times daily. 05/23/23     QUEtiapine  (SEROQUEL ) 200 MG tablet Take 1 tablet (200 mg total) by mouth at bedtime as needed for insomnia. 01/18/24     QUEtiapine  (SEROQUEL ) 400 MG tablet Take 1 tablet (400 mg total) by mouth at bedtime. 02/01/24     QUEtiapine  (SEROQUEL ) 400 MG tablet  Take 1 tablet (400 mg total) by mouth at bedtime 03/01/24     rosuvastatin  (CRESTOR ) 20 MG tablet Take 1 tablet (20 mg total) by mouth daily. 01/16/24     tirzepatide  (MOUNJARO ) 10 MG/0.5ML Pen Inject 10 mg into the skin once a week. 07/07/23     tirzepatide  (MOUNJARO ) 10 MG/0.5ML Pen Inject 10 mg into the skin once a week. 08/01/23     tirzepatide  (MOUNJARO ) 12.5 MG/0.5ML Pen Inject 12.5 mg into the skin once a week. 10/24/23     traMADol  (ULTRAM ) 50 MG tablet Take 1 tablet (50 mg total) by mouth 2 (two) times daily as needed. 12/14/23     traZODone  (DESYREL ) 150 MG tablet Take 1 tablet (150 mg total) by mouth at bedtime. 01/11/24     traZODone  (DESYREL ) 150 MG tablet Take 1 tablet (150 mg total) by mouth at bedtime. 03/12/24     umeclidinium-vilanterol (ANORO ELLIPTA ) 62.5-25 MCG/ACT AEPB Inhale 1 puff into the lungs daily. 02/08/24   Isadora Hose, MD  ARIPiprazole  (ABILIFY ) 2 MG tablet Take 1 tablet (2 mg total) by mouth once daily for 120 days 09/01/23 12/08/23       Critical care provider statement:   Total critical care time: 33 minutes   Performed by: Parris MD   Critical care time was exclusive of separately billable procedures and treating other patients.   Critical care was necessary to treat or prevent imminent or life-threatening deterioration.   Critical care was time spent personally by me on the following activities: development of treatment plan with patient and/or surrogate as well as nursing, discussions with consultants, evaluation of patient's response to treatment, examination of patient, obtaining history from patient or surrogate, ordering and performing treatments and interventions, ordering and review of laboratory studies, ordering and review of radiographic studies, pulse oximetry and re-evaluation of patient's condition.    Reema Chick, M.D.  Pulmonary & Critical Care Medicine                 [1] No Known Allergies  "

## 2024-05-07 NOTE — Progress Notes (Addendum)
 Initial Nutrition Assessment  DOCUMENTATION CODES:   Obesity unspecified  INTERVENTION:   Vital HP @40ml /hr- Initiate at 54ml/hr and increase by 10ml/hr q 12 hours until goal rate is reached.   ProSource TF 20- Give 60ml TID via tube, each supplement provides 80kcal and 20g of protein.   Propofol : 24.3 ml/hr- provides 642kcal/day   Free water  flushes 30ml q4 hours to maintain tube patency   Regimen provides 1842kcal/day, 144g/day protein and 915ml/day of free water .   MVI, folic acid  and thiamine  daily via tube  B-complex with C daily via tube   Juven Fruit Punch BID via tube, each serving provides 95kcal and 2.5g of protein (amino acids glutamine and arginine)  Pt at high refeed risk; recommend monitor potassium, magnesium  and phosphorus labs daily until stable  Daily weights   NUTRITION DIAGNOSIS:   Inadequate oral intake related to inability to eat (pt sedated and ventilated) as evidenced by NPO status.  GOAL:   Provide needs based on ASPEN/SCCM guidelines  MONITOR:   Vent status, Labs, Weight trends, TF tolerance, Skin, I & O's  REASON FOR ASSESSMENT:   Ventilator    ASSESSMENT:   64 y/o male with h/o STEMI s/p RCA DES, etoh abuse, CAD, PTSD, SI, anxiety, MDD, COPD, DM, HLD and HTN who is admitted with PNA, sepsis, shock, ethylene glycol poisoning and AKI.  Pt sedated and ventilated. OGT in place. Will plan to initiate tube feeds today. Pt is at high refeed risk.   Per chart, pt appears weight stable at baseline and appears to be at his UBW currently.   Medications reviewed and include: pepcid , folic acid , insulin , midodrine , MVI, miralax , senokot, thiamine , zosyn , propofol    Labs reviewed: K 5.4(H), P 2.7 wnl, Mg 2.0 wnl  Hgb 10.8(L), Hct 31.8(L) Cbgs- 124, 103, 94 x 24 hrs  AIC 5.5- 1/16  Patient is currently intubated on ventilator support MV: 14 L/min Temp (24hrs), Avg:98.4 F (36.9 C), Min:96.4 F (35.8 C), Max:102 F (38.9 C)  Propofol :  24.3 ml/hr- provides 642kcal/day   MAP >4mmHg   UOP-   NUTRITION - FOCUSED PHYSICAL EXAM:  Flowsheet Row Most Recent Value  Orbital Region No depletion  Upper Arm Region No depletion  Thoracic and Lumbar Region No depletion  Buccal Region No depletion  Temple Region No depletion  Clavicle Bone Region No depletion  Clavicle and Acromion Bone Region No depletion  Scapular Bone Region No depletion  Dorsal Hand No depletion  Patellar Region No depletion  Anterior Thigh Region No depletion  Posterior Calf Region No depletion  Edema (RD Assessment) Mild  Hair Reviewed  Eyes Reviewed  Mouth Reviewed  Skin Reviewed  Nails Reviewed   Diet Order:   Diet Order     None      EDUCATION NEEDS:   No education needs have been identified at this time  Skin:  Skin Assessment: Reviewed RN Assessment (Stage II R and L elbows, ecchymosis)  Last BM:  pta  Height:   Ht Readings from Last 1 Encounters:  05/04/24 5' 8 (1.727 m)    Weight:   Wt Readings from Last 1 Encounters:  05/07/24 99.4 kg    Ideal Body Weight:  70 kg  BMI:  Body mass index is 33.32 kg/m.  Estimated Nutritional Needs:   Kcal:  1100-1400kcal/day  Protein:  >140g/day  Fluid:  2.1-2.4L/day  Augustin Shams MS, RD, LDN If unable to be reached, please send secure chat to RD inpatient available from 8:00a-4:00p daily

## 2024-05-08 LAB — CBC
HCT: 36.4 % — ABNORMAL LOW (ref 39.0–52.0)
Hemoglobin: 12 g/dL — ABNORMAL LOW (ref 13.0–17.0)
MCH: 32 pg (ref 26.0–34.0)
MCHC: 33 g/dL (ref 30.0–36.0)
MCV: 97.1 fL (ref 80.0–100.0)
Platelets: 92 K/uL — ABNORMAL LOW (ref 150–400)
RBC: 3.75 MIL/uL — ABNORMAL LOW (ref 4.22–5.81)
RDW: 17.2 % — ABNORMAL HIGH (ref 11.5–15.5)
WBC: 5.7 K/uL (ref 4.0–10.5)
nRBC: 0 % (ref 0.0–0.2)

## 2024-05-08 LAB — HEPATIC FUNCTION PANEL
ALT: 398 U/L — ABNORMAL HIGH (ref 0–44)
AST: 123 U/L — ABNORMAL HIGH (ref 15–41)
Albumin: 2.8 g/dL — ABNORMAL LOW (ref 3.5–5.0)
Alkaline Phosphatase: 128 U/L — ABNORMAL HIGH (ref 38–126)
Bilirubin, Direct: 1.4 mg/dL — ABNORMAL HIGH (ref 0.0–0.2)
Indirect Bilirubin: 0.4 mg/dL (ref 0.3–0.9)
Total Bilirubin: 1.7 mg/dL — ABNORMAL HIGH (ref 0.0–1.2)
Total Protein: 5.5 g/dL — ABNORMAL LOW (ref 6.5–8.1)

## 2024-05-08 LAB — GLUCOSE, CAPILLARY
Glucose-Capillary: 101 mg/dL — ABNORMAL HIGH (ref 70–99)
Glucose-Capillary: 122 mg/dL — ABNORMAL HIGH (ref 70–99)
Glucose-Capillary: 124 mg/dL — ABNORMAL HIGH (ref 70–99)
Glucose-Capillary: 130 mg/dL — ABNORMAL HIGH (ref 70–99)
Glucose-Capillary: 132 mg/dL — ABNORMAL HIGH (ref 70–99)
Glucose-Capillary: 135 mg/dL — ABNORMAL HIGH (ref 70–99)
Glucose-Capillary: 147 mg/dL — ABNORMAL HIGH (ref 70–99)
Glucose-Capillary: 177 mg/dL — ABNORMAL HIGH (ref 70–99)
Glucose-Capillary: 93 mg/dL (ref 70–99)

## 2024-05-08 LAB — PHOSPHORUS: Phosphorus: 3 mg/dL (ref 2.5–4.6)

## 2024-05-08 LAB — BASIC METABOLIC PANEL WITH GFR
Anion gap: 11 (ref 5–15)
BUN: 13 mg/dL (ref 8–23)
CO2: 25 mmol/L (ref 22–32)
Calcium: 8.4 mg/dL — ABNORMAL LOW (ref 8.9–10.3)
Chloride: 103 mmol/L (ref 98–111)
Creatinine, Ser: 0.81 mg/dL (ref 0.61–1.24)
GFR, Estimated: >60 mL/min
Glucose, Bld: 124 mg/dL — ABNORMAL HIGH (ref 70–99)
Potassium: 3.5 mmol/L (ref 3.5–5.1)
Sodium: 139 mmol/L (ref 135–145)

## 2024-05-08 LAB — CK: Total CK: 80 U/L (ref 49–397)

## 2024-05-08 LAB — CULTURE, BAL-QUANTITATIVE W GRAM STAIN

## 2024-05-08 LAB — MAGNESIUM: Magnesium: 2.1 mg/dL (ref 1.7–2.4)

## 2024-05-08 LAB — TRIGLYCERIDES: Triglycerides: 307 mg/dL — ABNORMAL HIGH

## 2024-05-08 MED ORDER — LORAZEPAM 2 MG/ML IJ SOLN
INTRAMUSCULAR | Status: AC
  Administered 2024-05-08: 2 mg
  Filled 2024-05-08: qty 1

## 2024-05-08 MED ORDER — POTASSIUM CHLORIDE 20 MEQ PO PACK
20.0000 meq | PACK | Freq: Once | ORAL | Status: AC
  Administered 2024-05-08: 20 meq
  Filled 2024-05-08: qty 1

## 2024-05-08 MED ORDER — LORAZEPAM 2 MG/ML IJ SOLN: 2.0000 mg | Freq: Once | INTRAMUSCULAR | Status: AC

## 2024-05-08 MED ORDER — VALPROATE SODIUM 100 MG/ML IV SOLN
500.0000 mg | Freq: Three times a day (TID) | INTRAVENOUS | Status: DC
  Administered 2024-05-08 – 2024-05-11 (×9): 500 mg via INTRAVENOUS
  Filled 2024-05-08 (×11): qty 5

## 2024-05-08 NOTE — Progress Notes (Signed)
 "  NAME:  Larry French, MRN:  [id number], DOB:  [birth year], LOS: 4 ADMISSION DATE:  05/04/2024, CONSULTATION DATE:  05/03/24 REFERRING MD:  Dr. Neomi, CHIEF COMPLAINT:  AMS   History of Present Illness:  Larry French is a 64 y.o. male with history of COPD, alcohol and benzodiazepine abuse, hypertension, hyperlipidemia and CAD who presented to the [facility] ED after he was found down outside of his father's house with altered mental status.    History gathered per chart review EMS reports they had an axillary temperature of 88, and were unable to get an oxygen saturation or blood pressure at the time of arrival on scene.  Blood glucose was in the low 100s.  It was reported that the patient was awake and breathing on his own but unable to answer questions or following commands.  Intermittently agitated. Per EMS, father told him that if he wanted to come inside he would have to crawl inside. On arrival to the home, it was reported he was found outside naked with various abrasions scattered throughout his body after attempting to crawl inside.  It is unknown how long he was outside. The patient did not make it inside. His father found him and called 911.   The father states that the last time he saw patient was around suppertime, around 5 to 6 PM, stating he was normal at that time. He states that he woke up around 1:30 AM and noticed that his bedroom light was on and found the patient lying on the ground outside of their carport in the gravel driveway. States he was awake but could not talk much. When asked why he thought the patient was unable to talk father states that because he was cold. His father does not think that he was intoxicated but states that he does not know what medications the patient takes or if he has used any drugs or alcohol recently. It is reported the last time he notes that the patient was actively drinking was about 2 weeks ago. He states the patient did fall about 2  days ago because he loses his balance often. Denied any other known injury. No known recent fevers, cough, vomiting or diarrhea. He does not think that the patient had any toxic alcohols or illicit drugs but is not sure. He does report that the patient takes Anastacia powders regularly. Denies that the patient has had any recent suicidal thoughts and does not think that this was a suicide attempt.   ED course: Upon arrival to the ED, the patient was profoundly hypotensive requiring emergent central line placement for vasopressor support given difficulty with access. Although he was lethargic but awake on arrival, he did become more lethargic and agitated requiring sedation and ultimately was emergently intubated at bedside by the EDP for airway protection. He was put on the [facility] and started on warmed IVFs with heated packs to assist with severe hypothermia. He was found to have severe AGMA and was given 3 amps of bicarb followed by a bicarb gtt. Mild lactic acidosis iso sepsis but found to be + for salicylates. Poison control contacted and directed to start fomepizole  for potential toxic ethanol ingestion. Nephrology contacted with concern for the need of CRRT and plan to see him during the day.   Vitals on Arrival: temp: 85.4 F, BP 61/45, HR 82, RR 16, SpO2 78%  Pertinent Labs/Diagnostics: Found to be with profound AG metabolic acidosis and lactic acidosis iso of acute renal failure,  rhabdomyolysis and with a supratherapeutic INR.  LILLETTE Robet Kim, PA-C personally viewed and interpreted this ECG. EKG Interpretation: Date: 05/03/24, EKG Time: 0320, Rate: 102, Rhythm: irregularly irregular, QRS Duration:  131 Intervals: prolonged QT interval, ST/T Wave abnormalities: severe global ischemia without acute ST elevation, Narrative Interpretation: atrial fibrillation, PVCs, with severe global ischemia, hx of inferior STEMI  Chemistry: Na+:132, K+: 436, BUN/Cr.: 37/3.53, Serum CO2/ AG: 10/28, Mg: 3.1, Alk  Phos 128, AST/ALT: 3895/2503, CK 411, Beta Hydoxy: 0.93, Glucose: 260  Hematology: WBC: 9.3, Hgb: 12.7, Platelets 103, INR 3.5 Troponin: 32, Lactic/ PCT: 3.7 >> >9.0/ 63.20, COVID-19 & Influenza A/B: negative ABG: ph 6.95 pCO2 56, pO2 44, acid base deficit 20.9, bicarb 11.8  Ethanol: negative UDS: + benzos  CXR Impression :  1. Patchy airspace consolidation in the base of both lungs, consistent with pneumonia or aspiration. Mid and upper lungs remain clear. 2. Endotracheal tube and nasogastric tube are in satisfactory position.  CT Head Impression:  1. No acute intracranial abnormality. 2. Chronic microvascular ischemic disease in bilateral cerebral hemispheres deep and periventricular white matter. 3. Chronic left thalamic lacunar infarctions. 4. Atherosclerotic calcifications within the cavernous internal carotid and vertebral arteries.  CT C Spine Impression: 1. No evidence of fracture or acute traumatic injury. 2. Mild diffuse degenerative disc disease throughout the cervical spine.  CT Chest/Abd/Pelvis Impression: 1. Endotracheal tube tip in the proximal right mainstem bronchus; recommend retraction/repositioning. 2. Streaky dependent lower lobe atelectasis/consolidation bilaterally with scattered patchy reticular opacities in the right upper lobe. 3. Healing/healed fractures of the right anterior/lateral 4th through 9th ribs; no evidence of acute traumatic injury. 4. Cardiomegaly with moderate calcific coronary artery disease. 5. Hepatic steatosis and small exophytic right renal cysts. 6. Chronic mild-to-moderate L1 compression deformity and chronic bilateral L5 spondylolysis.  Medications Administered:  NaCl IVF Sodium bicarb amps Ativan  1mg  x 2 Tdap Thiamine  Sodium bicarb gtt 2g magnesium  Cefepime  Vanc Flagyl  Fentanyl  + propofol   PCCM consulted for admission due to respiratory failure requiring mechanical ventilation and circulatory shock requiring  vasopressors.  05/05/24- patient remains critically ill on MV.  Treating for pneumonia and antifreeze overdose. Ventilator management -weaned FiO2 on PRVC from 100 to 40%. SBT today. Unable to wean due to heavy mucus per ETT clogging circuit.  05/06/24- patient was performing SBT with sedation off he self extubated.  After appx 1 hour he was in severe resp distress and required re-intubation. Poison control is continuing to manage ethylene glycol poisoning and recommends continuing fomepizole .  05/07/24-patient on MV. Secretions are heavy,  BAL results no growth. He is still being treated for antifreeze poisoning.  05/08/24- propofol  and fentanyl  are off and precedex  still maximal dose infusing for awakening trial but patient is already aggitated  Pertinent Medical History  Polysubstance abuse - cocaine, opioids Hypertension Hyperlipidemia CAD Anxiety Insomnia Hx of MI 2013 PTSD Tobacco use  Significant Hospital Events: Including procedures, antibiotic start and stop dates in addition to other pertinent events   1/15: Admitted for circulatory shock s/t suspected sepsis, severe hypothermia and acute renal failure with hypoxic respiratory failure requiring mechanical ventilation and vasopressor support. Positive for salicylates, poison control contacted and recommended started fomepizole  after sending off for a toxic ethanol level. PCCM consulted for admission. 05/04/24- Remains critically ill on MV.  Ordered workup for ethylene glycol poisoning.  Repeat EKG.  RVP. Continue abx. Weaned to 40% PRVC    Objective    Blood pressure (!) 155/93, pulse 95, temperature 98.6 F (37 C), resp. rate ROLLEN)  28, height 5' 8 (1.727 m), weight 93.3 kg, SpO2 93%.    Vent Mode: PRVC FiO2 (%):  [30 %-35 %] 30 % Set Rate:  [22 bmp] 22 bmp Vt Set:  [500 mL] 500 mL PEEP:  [5 cmH20] 5 cmH20 Plateau Pressure:  [19 cmH20] 19 cmH20   Intake/Output Summary (Last 24 hours) at 05/08/2024 1102 Last data filed at  05/08/2024 0900 Gross per 24 hour  Intake 2019.02 ml  Output 2575 ml  Net -555.98 ml   Filed Weights   05/06/24 0418 05/07/24 0356 05/08/24 0345  Weight: 104.2 kg 99.4 kg 93.3 kg    Examination: General: Adult male, critically ill, lying in bed intubated & sedated requiring mechanical ventilation, NAD HENT: anicteric sclerae, atraumatic, normocephalic, neck supple, no JVD CV: irregularly irregular, S1 S2, atrial fibrillation on monitor, no r/m/g Pulm: Regular, non labored on the ventilator , breath sounds equal bilaterally without wheezing, rhonchi or rales GI: soft, non-tender, mild distention, hypoactive bowels  Extremities: cool and dry, peripheral pulses present but thready, no significant edema noted Skin: multiple scattered abrasions on extremities, abdomen and back Neuro: intubated and sedated, not following commands, does not withdraw from pain, PERRL  GU: foley in place, with very minimal dark yellow urine    Assessment and Plan   #Acute Metabolic Encephalopathy #Polysubstance Abuse #Hypothermia Hx: Polysubstance Abuse, Insomnia, PTSD, Alcohol Abuse Head CT: negative for acute intracranial abnormality - -s/p extubation - refeeding  #Acute Hypoxic Respiratory Failure #CAP vs Aspiration Pneumonia #COPD PMHx: COPD #Circulatory Shock s/t Acute Renal Failure vs Sepsis #Elevated Troponin likely d/t Demand Ischemia #Atrial Fibrillation #Non-sustained Ventricular Tachycardia #Severe AGMA with + salicylates #Lactic Acidosis #Acute Renal Failure + Uremia #Rhabdomyolysis - Strict I/O's; inform provider if UO is <0.5/hr - Trend BMP, Mg and Phos - Avoid nephrotoxins as able - Ensure adequate renal perfusion - ICU electrolyte replacement protocol as indicated - Poison control contacted and recommend repeat salicylate - Appreciate recommendations per Poison Control - Continue fomepizole  for suspected toxic alcohol ingestion - Continue IVFs and trend CK  - Likely will  need CRRT - Nephrology consulted; appreciate input  #Transaminitis s/t Shock Liver #Hyperammonemia #Supratherapeutic INR - Trend hepatic function - AST/ALT: 3895/2503 on admission - Ammonia 40; consider lactulose if worsening - Diet: NPO - Constipation protocol prn - GI prophylaxis: Famotidine  20mg  per tube - Continue IVFs and trend CK - Given INR of 3.5, consider Vitamin K  #Thrombocytopenia - Trend CBC - Transfuse if Hgb < 7 or platelets < 7 - Monitor for s/sx of bleeding - VTE prophylaxis: SQ heparin , SCDs  #Type II Diabetes Mellitus - ICU hypo/hyperglycemia protocol - SSI - CBG Q4h - Goal range 140-180  #Suspected Sepsis with unknown etiology - CAP vs Aspiration Pneumonia Initial interventions: IVFs + flagly + vanc + cefepime  - Trend WBC and monitor fever curve - Blood cultures pending - Respiratory PCR: negative - Respiratory 20 pathogen panel ordered - Broad spectrum ABX: zosyn  and vanc - Narrow pending culture and sensitivities  Labs   CBC: Recent Labs  Lab 05/04/24 0318 05/04/24 0518 05/05/24 0345 05/06/24 0334 05/07/24 0423 05/08/24 0239  WBC 9.3 8.3 4.0 3.9* 6.7 5.7  NEUTROABS 7.3  --   --  1.5* 3.6  --   HGB 12.7* 13.2 11.8* 12.1* 10.8* 12.0*  HCT 40.8 41.8 34.0* 36.5* 31.8* 36.4*  MCV 102.3* 101.0* 92.9 96.1 94.1 97.1  PLT 103* 81* 87* 74* 89* 92*    Basic Metabolic Panel: Recent Labs  Lab  05/04/24 1746 05/04/24 1747 05/05/24 0345 05/05/24 2221 05/06/24 0334 05/06/24 2020 05/07/24 0423 05/08/24 0239  NA  --  140 138  --  136  --  137 139  K  --  3.3* 3.1* 3.3* 3.1* 4.6 5.4* 3.5  CL  --  100 99  --  101  --  104 103  CO2  --  28 30  --  26  --  22 25  GLUCOSE  --  134* 113*  --  103*  --  101* 124*  BUN  --  28* 20  --  11  --  10 13  CREATININE  --  1.66* 1.27*  --  0.94  --  0.90 0.81  CALCIUM   --  6.7* 7.1*  --  7.5*  --  8.1* 8.4*  MG 2.3  --  2.2  --  2.0  --  2.0 2.1  PHOS 2.3*  --  1.7* 1.8* 1.5* 3.4 2.7 3.0    GFR: Estimated Creatinine Clearance: 104.9 mL/min (by C-G formula based on SCr of 0.81 mg/dL). Recent Labs  Lab 05/04/24 0318 05/04/24 0518 05/05/24 0345 05/05/24 0639 05/06/24 0334 05/07/24 0423 05/08/24 0239  PROCALCITON 63.20  --   --   --   --   --   --   WBC 9.3 8.3 4.0  --  3.9* 6.7 5.7  LATICACIDVEN 3.7* >9.0*  --  1.8  --   --   --     Liver Function Tests: Recent Labs  Lab 05/04/24 0318 05/05/24 0332 05/06/24 0334 05/07/24 0423 05/08/24 0239  AST 3,895* 1,420*  --   --  123*  ALT 2,503* 1,398*  --   --  398*  ALKPHOS 128* 102  --   --  128*  BILITOT 1.0 1.2  --   --  1.7*  PROT 5.9* 4.8*  --   --  5.5*  ALBUMIN 3.4* 2.5* 2.5* 2.8* 2.8*   No results for input(s): LIPASE, AMYLASE in the last 168 hours. Recent Labs  Lab 05/04/24 0318  AMMONIA 40*    ABG    Component Value Date/Time   PHART 7.19 (LL) 05/06/2024 1300   PCO2ART 69 (HH) 05/06/2024 1300   PO2ART 104 05/06/2024 1300   HCO3 25.6 05/06/2024 1300   ACIDBASEDEF 3.6 (H) 05/06/2024 1300   O2SAT 98.4 05/06/2024 1300     Coagulation Profile: Recent Labs  Lab 05/04/24 0433  INR 3.5*    Cardiac Enzymes: Recent Labs  Lab 05/04/24 0318 05/04/24 0938 05/05/24 0332 05/08/24 0239  CKTOTAL 411* 677* 999* 80    HbA1C: Hgb A1c MFr Bld  Date/Time Value Ref Range Status  05/04/2024 05:18 AM 5.5 4.8 - 5.6 % Final    Comment:    (NOTE) Diagnosis of Diabetes The following HbA1c ranges recommended by the American Diabetes Association (ADA) may be used as an aid in the diagnosis of diabetes mellitus.  Hemoglobin             Suggested A1C NGSP%              Diagnosis  <5.7                   Non Diabetic  5.7-6.4                Pre-Diabetic  >6.4                   Diabetic  <7.0  Glycemic control for                       adults with diabetes.    12/31/2020 11:28 AM 5.5 4.8 - 5.6 % Final    Comment:             Prediabetes: 5.7 - 6.4          Diabetes: >6.4           Glycemic control for adults with diabetes: <7.0     CBG: Recent Labs  Lab 05/07/24 1539 05/07/24 1924 05/07/24 2336 05/08/24 0307 05/08/24 0754  GLUCAP 140* 101* 93 135* 122*    Review of Systems:   Unable to assess as patient is intubated and sedated.  Past Medical History:  He,  has a past medical history of Alcohol abuse, Anxiety, CA - skin cancer, CAD, residual CFX LAD disease (06/01/2011), Depression, Headache(784.0), Hyperlipemia, Hypertension, Insomnia, Mental disorder, MI (myocardial infarction) (HCC), Polysubstance abuse (HCC), and PTSD (post-traumatic stress disorder).   Surgical History:   Past Surgical History:  Procedure Laterality Date   CARDIAC CATHETERIZATION Bilateral 04/09/2015   Procedure: Coronary Angiogram;  Surgeon: Deatrice DELENA Cage, MD;  Location: [facility] INVASIVE CV LAB;  Service: Cardiovascular;  Laterality: Bilateral;   CARDIAC CATHETERIZATION [name] 04/09/2015   Procedure: Coronary Stent Intervention;  Surgeon: Deatrice DELENA Cage, MD;  Location: [facility] INVASIVE CV LAB;  Service: Cardiovascular;  Laterality: [name];   COLONOSCOPY WITH PROPOFOL  [name] 03/19/2019   Procedure: COLONOSCOPY WITH PROPOFOL ;  Surgeon: Unk Corinn Skiff, MD;  Location: [facility] ENDOSCOPY;  Service: Gastroenterology;  Laterality: [name];   LEFT HEART CATH Right 05/29/2011   Procedure: LEFT HEART CATH;  Surgeon: Alm LELON Clay, MD;  Location: [facility];  Service: Cardiovascular;  Laterality: Right;   LEFT HEART CATHETERIZATION WITH CORONARY ANGIOGRAM [name] 06/01/2011   Procedure: LEFT HEART CATHETERIZATION WITH CORONARY ANGIOGRAM;  Surgeon: Alm LELON Clay, MD;  Location: [facility] CATH LAB;  Service: Cardiovascular;  Laterality: [name];  relook cath, possible PCI   TONSILLECTOMY AND ADENOIDECTOMY       Social History:   reports that he has been smoking cigarettes. He has a 45 pack-year smoking history. He has never used smokeless tobacco. He reports that he does not drink alcohol and does not use drugs.    Family History:  His family history includes Anxiety disorder in his father and mother; Cancer in his mother; Chronic Renal Failure in his mother; Coronary artery disease (age of onset: 62) in his maternal grandfather; Depression in his father and mother; Other in his brother.   Allergies Allergies[1]   Home Medications  Prior to Admission medications  Medication Sig Start Date End Date Taking? Authorizing Provider  aspirin  81 MG EC tablet TAKE ONE TABLET BY MOUTH EVERY DAY 01/09/16   McGowan, Clotilda A, PA-C  clonazePAM  (KLONOPIN ) 1 MG tablet Take 1 tablet (1 mg total) by mouth 3 (three) times daily. 04/23/24     clopidogrel  (PLAVIX ) 75 MG tablet Take 1 tablet (75 mg total) by mouth daily. 05/23/23     doxepin  (SINEQUAN ) 25 MG capsule Take 1-2 capsules (25-50 mg total) by mouth at bedtime. 03/26/24     gabapentin  (NEURONTIN ) 300 MG capsule Take 1 capsule (300 mg total) by mouth 3 (three) times daily. 02/14/24     gabapentin  (NEURONTIN ) 300 MG capsule Take 2 capsules (600 mg total) by mouth 3 (three) times daily. 04/05/24     hydrOXYzine  (VISTARIL ) 25 MG capsule Take 1  capsule (25 mg total) by mouth 2 (two) times daily. 03/12/24     hydrOXYzine  (VISTARIL ) 25 MG capsule Take 1 capsule (25 mg total) by mouth 2 (two) times daily. 04/23/24     lidocaine  (LIDODERM ) 5 % Place 1 patch onto the skin daily Apply patch to the most painful area for up to 12 hours in a 24 hour period. Patient taking differently: Place 1 patch onto the skin as directed. PRN 01/17/24     losartan  (COZAAR ) 100 MG tablet Take 1 tablet (100 mg total) by mouth daily. 10/17/23     losartan  (COZAAR ) 100 MG tablet Take 1 tablet (100 mg total) by mouth daily. 03/12/24     metFORMIN  (GLUCOPHAGE ) 500 MG tablet Take 1 tablet (500 mg total) by mouth daily with breakfast. 01/11/24     metoprolol  tartrate (LOPRESSOR ) 25 MG tablet Take 1 tablet (25 mg total) by mouth 2 (two) times daily. 05/23/23     QUEtiapine  (SEROQUEL ) 200 MG tablet Take 1  tablet (200 mg total) by mouth at bedtime as needed for insomnia. 01/18/24     QUEtiapine  (SEROQUEL ) 400 MG tablet Take 1 tablet (400 mg total) by mouth at bedtime. 02/01/24     QUEtiapine  (SEROQUEL ) 400 MG tablet Take 1 tablet (400 mg total) by mouth at bedtime 03/01/24     rosuvastatin  (CRESTOR ) 20 MG tablet Take 1 tablet (20 mg total) by mouth daily. 01/16/24     tirzepatide  (MOUNJARO ) 10 MG/0.5ML Pen Inject 10 mg into the skin once a week. 07/07/23     tirzepatide  (MOUNJARO ) 10 MG/0.5ML Pen Inject 10 mg into the skin once a week. 08/01/23     tirzepatide  (MOUNJARO ) 12.5 MG/0.5ML Pen Inject 12.5 mg into the skin once a week. 10/24/23     traMADol  (ULTRAM ) 50 MG tablet Take 1 tablet (50 mg total) by mouth 2 (two) times daily as needed. 12/14/23     traZODone  (DESYREL ) 150 MG tablet Take 1 tablet (150 mg total) by mouth at bedtime. 01/11/24     traZODone  (DESYREL ) 150 MG tablet Take 1 tablet (150 mg total) by mouth at bedtime. 03/12/24     umeclidinium-vilanterol (ANORO ELLIPTA ) 62.5-25 MCG/ACT AEPB Inhale 1 puff into the lungs daily. 02/08/24   Isadora Hose, MD  ARIPiprazole  (ABILIFY ) 2 MG tablet Take 1 tablet (2 mg total) by mouth once daily for 120 days 09/01/23 12/08/23       Critical care provider statement:   Total critical care time: 33 minutes   Performed by: Parris MD   Critical care time was exclusive of separately billable procedures and treating other patients.   Critical care was necessary to treat or prevent imminent or life-threatening deterioration.   Critical care was time spent personally by me on the following activities: development of treatment plan with patient and/or surrogate as well as nursing, discussions with consultants, evaluation of patient's response to treatment, examination of patient, obtaining history from patient or surrogate, ordering and performing treatments and interventions, ordering and review of laboratory studies, ordering and review of radiographic  studies, pulse oximetry and re-evaluation of patient's condition.    Silvia Hightower, M.D.  Pulmonary & Critical Care Medicine                  [1] No Known Allergies  "

## 2024-05-08 NOTE — Plan of Care (Signed)
 PMT consult noted for GOC. Notes reviewed. Initial notes discuss EMS report of patient being told he would have to crawl inside, and being found naked with abrasions and hypothermic. Communicated with TOC to determine if DSS has been contacted, and to determine their plans. PMT will await their feedback.

## 2024-05-08 NOTE — Plan of Care (Signed)
 Notes and labs reviewed.  Patient remains on ventilator support.  Spoke with TOC and CCM during rounds.  TOC is reaching out to DSS to determine next steps.  PMT will await response.

## 2024-05-08 NOTE — Consult Note (Addendum)
 Pharmacy Consult Note - Electrolytes  Sodium (mmol/L)  Date Value  05/08/2024 139  02/11/2022 136  03/07/2014 143   Potassium (mmol/L)  Date Value  05/08/2024 3.5  03/07/2014 3.2 (L)   Magnesium  (mg/dL)  Date Value  98/79/7973 2.1  03/04/2014 2.3   Calcium  (mg/dL)  Date Value  98/79/7973 8.4 (L)   Calcium , Total (mg/dL)  Date Value  88/80/7984 7.4 (L)   Albumin (g/dL)  Date Value  98/79/7973 2.8 (L)  10/22/2020 4.2  03/07/2014 3.4   Phosphorus (mg/dL)  Date Value  98/79/7973 3.0    ASSESSMENT: 64 y.o. male with PMH including CAD/STEMI s/p DES (2012), alcohol abuse presents with hypothermia after being found unresponsive outside in cold. Pharmacy has been consulted to monitor and replace electrolytes. Patient's renal function is currently poor because he is in AKI > AKI resolved. Patient with new Afib w/ RVR on EKG 1/19  Lab Results  Component Value Date   CREATININE 0.81 05/08/2024   CREATININE 0.90 05/07/2024   CREATININE 0.94 05/06/2024    mIVF: [name]  Pertinent medications: n/a  Goal of Therapy:  Potassium 4 - 5.1 Magnesium  > 2  PLAN: Potassium 3.5 today following Lokelma  x1 yesterday Administer Kcl 20mEq per tube F/u with AM labs.   Thank you for allowing pharmacy to be a part of this patients care.  Belvie Macintosh, PharmD Candidate 05/08/2024 7:36 AM

## 2024-05-08 NOTE — Plan of Care (Signed)
 Pt remains intubated/sedated. Propofol  and Fentanyl  drips for sedation, titrated per orders. TF increased per orders. Foley remains in place w/ adequate urine output. Pt's father updated at the beginning of the shift. Bed in lowest position with bed alarms on.   Problem: Education: Goal: Ability to describe self-care measures that may prevent or decrease complications (Diabetes Survival Skills Education) will improve Outcome: Not Progressing   Problem: Fluid Volume: Goal: Ability to maintain a balanced intake and output will improve Outcome: Progressing   Problem: Metabolic: Goal: Ability to maintain appropriate glucose levels will improve Outcome: Progressing   Problem: Nutritional: Goal: Maintenance of adequate nutrition will improve Outcome: Progressing   Problem: Clinical Measurements: Goal: Will remain free from infection Outcome: Progressing Goal: Respiratory complications will improve Outcome: Not Progressing Goal: Cardiovascular complication will be avoided Outcome: Not Progressing   Problem: Elimination: Goal: Will not experience complications related to bowel motility Outcome: Not Progressing Goal: Will not experience complications related to urinary retention Outcome: Progressing

## 2024-05-08 NOTE — TOC Progression Note (Signed)
 Transition of Care [facility]) - Progression Note    Patient Details  Name: Larry French MRN: [id number] Date of Birth: [birth year]  Transition of [facility] CM/SW Contact  K'La JINNY Ruts, LCSW Phone Number: 05/08/2024, 2:38 PM  Clinical Narrative:    Chart reviewed. SW made APS reports from chart review. The SW spoke with the nurse. Nurse reports that she spoke with the patient father and he explained a different story and there was a a miscommunications.  Per nurse the patient takes care of the his father and the father was not able to help him inside. While the patient was crawling inside the patient pants came down.  SW still made the APS report with Ellouise Christmas.                       Expected Discharge Plan and Services                                               Social Drivers of Health (SDOH) Interventions SDOH Screenings   Food Insecurity: No Food Insecurity (01/11/2024)   Received from [facility]  Recent Concern: Food Insecurity - Food Insecurity Present (11/02/2023)   Received from [facility]  Housing: Low Risk  (01/11/2024)   Received from [facility]  Transportation Needs: No Transportation Needs (01/11/2024)   Received from [facility]  Recent Concern: Transportation Needs - Unmet Transportation Needs (11/02/2023)   Received from [facility]  Utilities: Not At Risk (01/11/2024)   Received from [facility]  Alcohol Screen: Low Risk (02/11/2022)  Depression (PHQ2-9): Medium Risk (02/11/2022)  Financial Resource Strain: Low Risk  (01/11/2024)   Received from [facility]  Physical Activity: Sufficiently Active (02/11/2022)  Social Connections: Moderately Isolated (02/11/2022)  Stress: Stress Concern Present (02/11/2022)  Tobacco Use: High Risk (05/04/2024)    Readmission Risk Interventions     No data to display

## 2024-05-09 LAB — GLUCOSE, CAPILLARY
Glucose-Capillary: 122 mg/dL — ABNORMAL HIGH (ref 70–99)
Glucose-Capillary: 127 mg/dL — ABNORMAL HIGH (ref 70–99)
Glucose-Capillary: 169 mg/dL — ABNORMAL HIGH (ref 70–99)
Glucose-Capillary: 149 mg/dL — ABNORMAL HIGH (ref 70–99)
Glucose-Capillary: 121 mg/dL — ABNORMAL HIGH (ref 70–99)

## 2024-05-09 LAB — BASIC METABOLIC PANEL WITH GFR
Anion gap: 9 (ref 5–15)
BUN: 20 mg/dL (ref 8–23)
CO2: 27 mmol/L (ref 22–32)
Calcium: 9 mg/dL (ref 8.9–10.3)
Chloride: 105 mmol/L (ref 98–111)
Creatinine, Ser: 0.92 mg/dL (ref 0.61–1.24)
GFR, Estimated: >60 mL/min
Glucose, Bld: 119 mg/dL — ABNORMAL HIGH (ref 70–99)
Potassium: 3.9 mmol/L (ref 3.5–5.1)
Sodium: 141 mmol/L (ref 135–145)

## 2024-05-09 LAB — CBC
HCT: 37.4 % — ABNORMAL LOW (ref 39.0–52.0)
Hemoglobin: 12.2 g/dL — ABNORMAL LOW (ref 13.0–17.0)
MCH: 32 pg (ref 26.0–34.0)
MCHC: 32.6 g/dL (ref 30.0–36.0)
MCV: 98.2 fL (ref 80.0–100.0)
Platelets: 126 K/uL — ABNORMAL LOW (ref 150–400)
RBC: 3.81 MIL/uL — ABNORMAL LOW (ref 4.22–5.81)
RDW: 17.1 % — ABNORMAL HIGH (ref 11.5–15.5)
WBC: 6 K/uL (ref 4.0–10.5)
nRBC: 0 % (ref 0.0–0.2)

## 2024-05-09 LAB — CULTURE, BLOOD (ROUTINE X 2)
Culture: NO GROWTH
Special Requests: ADEQUATE

## 2024-05-09 LAB — PHOSPHORUS: Phosphorus: 3.5 mg/dL (ref 2.5–4.6)

## 2024-05-09 LAB — MAGNESIUM: Magnesium: 2.3 mg/dL (ref 1.7–2.4)

## 2024-05-09 MED ORDER — POTASSIUM CHLORIDE 20 MEQ PO PACK
20.0000 meq | PACK | Freq: Once | ORAL | Status: AC
  Administered 2024-05-09: 20 meq
  Filled 2024-05-09: qty 1

## 2024-05-09 MED ORDER — METHYLPREDNISOLONE SODIUM SUCC 40 MG IJ SOLR
40.0000 mg | Freq: Once | INTRAMUSCULAR | Status: AC
  Administered 2024-05-09: 40 mg via INTRAVENOUS
  Filled 2024-05-09: qty 1

## 2024-05-09 NOTE — Plan of Care (Signed)
 Updated by nursing DSS is at bedside and TOC joining. PMT will continue to follow for surrogate decision maker. Continuing full code/ full scope at this time.

## 2024-05-09 NOTE — TOC Progression Note (Signed)
 Transition of Care [facility]) - Progression Note    Patient Details  Name: Larry French MRN: [id number] Date of Birth: [birth year]  [facility]) CM/SW Contact  K'La JINNY Ruts, LCSW Phone Number: 05/09/2024, 12:50 PM  Clinical Narrative:    Chart reviewed. SW was able to speak with APS worker, Aniya at bedside today. The APS worker reports that the father of the patient can still make decision for the patient. APS reports that they are investigating at this point. APS reports they are not legally able to make decision because they do not have guardianship. APS reports that she will make a report for the patient father due to the fact that the patient is the fathers care giver and the patient isnt physically able to continue to take care of him.    ICM will continue to follow the patient.                     Expected Discharge Plan and Services                                               Social Drivers of Health (SDOH) Interventions SDOH Screenings   Food Insecurity: No Food Insecurity (01/11/2024)   Received from [facility]  Recent Concern: Food Insecurity - Food Insecurity Present (11/02/2023)   Received from [facility]  Housing: Low Risk  (01/11/2024)   Received from [facility]  Transportation Needs: No Transportation Needs (01/11/2024)   Received from [facility]  Recent Concern: Transportation Needs - Unmet Transportation Needs (11/02/2023)   Received from [facility]  Utilities: Not At Risk (01/11/2024)   Received from [facility]  Alcohol Screen: Low Risk (02/11/2022)  Depression (PHQ2-9): Medium Risk (02/11/2022)  Financial Resource Strain: Low Risk  (01/11/2024)   Received from [facility]  Physical Activity: Sufficiently Active (02/11/2022)  Social Connections: Moderately Isolated (02/11/2022)  Stress: Stress  Concern Present (02/11/2022)  Tobacco Use: High Risk (05/04/2024)    Readmission Risk Interventions     No data to display

## 2024-05-09 NOTE — Plan of Care (Signed)

## 2024-05-09 NOTE — Plan of Care (Signed)

## 2024-05-09 NOTE — Plan of Care (Signed)
 Spoke with nursing and TOC.  APS was in to bedside today and investigation has been opened, as per [facility] patient's father remains the decision-maker.  Plans in place for patient's father to come to bedside tomorrow.  Spoke with patient's father via telephone and he states he plans to come to bedside tomorrow around 11 to meet with CCM and other care team members.  We discussed that PMT will plan to meet with him around 11:45, after their meeting.

## 2024-05-09 NOTE — Progress Notes (Signed)
 "  NAME:  Larry French, MRN:  [id number], DOB:  1960/06/14, LOS: 5 ADMISSION DATE:  05/04/2024, CONSULTATION DATE:  05/03/24 REFERRING MD:  Dr. Neomi, CHIEF COMPLAINT:  AMS   History of Present Illness:  Larry French is a 64 y.o. male with history of COPD, alcohol  and benzodiazepine abuse, hypertension, hyperlipidemia and CAD who presented to the [facility] ED after he was found down outside of his father's house with altered mental status.    History gathered per chart review EMS reports they had an axillary temperature of 88, and were unable to get an oxygen saturation or blood pressure at the time of arrival on scene.  Blood glucose was in the low 100s.  It was reported that the patient was awake and breathing on his own but unable to answer questions or following commands.  Intermittently agitated. Per EMS, father told him that if he wanted to come inside he would have to crawl inside. On arrival to the home, it was reported he was found outside naked with various abrasions scattered throughout his body after attempting to crawl inside.  It is unknown how long he was outside. The patient did not make it inside. His father found him and called 911.   The father states that the last time he saw patient was around suppertime, around 5 to 6 PM, stating he was normal at that time. He states that he woke up around 1:30 AM and noticed that his bedroom light was on and found the patient lying on the ground outside of their carport in the gravel driveway. States he was awake but could not talk much. When asked why he thought the patient was unable to talk father states that because he was cold. His father does not think that he was intoxicated but states that he does not know what medications the patient takes or if he has used any drugs or alcohol  recently. It is reported the last time he notes that the patient was actively drinking was about 2 weeks ago. He states the patient did fall about 2  days ago because he loses his balance often. Denied any other known injury. No known recent fevers, cough, vomiting or diarrhea. He does not think that the patient had any toxic alcohols or illicit drugs but is not sure. He does report that the patient takes Anastacia powders regularly. Denies that the patient has had any recent suicidal thoughts and does not think that this was a suicide attempt.   ED course: Upon arrival to the ED, the patient was profoundly hypotensive requiring emergent central line placement for vasopressor support given difficulty with access. Although he was lethargic but awake on arrival, he did become more lethargic and agitated requiring sedation and ultimately was emergently intubated at bedside by the EDP for airway protection. He was put on the [facility] and started on warmed IVFs with heated packs to assist with severe hypothermia. He was found to have severe AGMA and was given 3 amps of bicarb followed by a bicarb gtt. Mild lactic acidosis iso sepsis but found to be + for salicylates. Poison control contacted and directed to start fomepizole  for potential toxic ethanol ingestion. Nephrology contacted with concern for the need of CRRT and plan to see him during the day.   Vitals on Arrival: temp: 85.4 F, BP 61/45, HR 82, RR 16, SpO2 78%  Pertinent Labs/Diagnostics: Found to be with profound AG metabolic acidosis and lactic acidosis iso of acute renal failure,  rhabdomyolysis and with a supratherapeutic INR.  LILLETTE Robet Kim, PA-C personally viewed and interpreted this ECG. EKG Interpretation: Date: 05/03/24, EKG Time: 0320, Rate: 102, Rhythm: irregularly irregular, QRS Duration:  131 Intervals: prolonged QT interval, ST/T Wave abnormalities: severe global ischemia without acute ST elevation, Narrative Interpretation: atrial fibrillation, PVCs, with severe global ischemia, hx of inferior STEMI  Chemistry: Na+:132, K+: 436, BUN/Cr.: 37/3.53, Serum CO2/ AG: 10/28, Mg: 3.1, Alk  Phos 128, AST/ALT: 3895/2503, CK 411, Beta Hydoxy: 0.93, Glucose: 260  Hematology: WBC: 9.3, Hgb: 12.7, Platelets 103, INR 3.5 Troponin: 32, Lactic/ PCT: 3.7 >> >9.0/ 63.20, COVID-19 & Influenza A/B: negative ABG: ph 6.95 pCO2 56, pO2 44, acid base deficit 20.9, bicarb 11.8  Ethanol: negative UDS: + benzos  CXR Impression :  1. Patchy airspace consolidation in the base of both lungs, consistent with pneumonia or aspiration. Mid and upper lungs remain clear. 2. Endotracheal tube and nasogastric tube are in satisfactory position.  CT Head Impression:  1. No acute intracranial abnormality. 2. Chronic microvascular ischemic disease in bilateral cerebral hemispheres deep and periventricular white matter. 3. Chronic left thalamic lacunar infarctions. 4. Atherosclerotic calcifications within the cavernous internal carotid and vertebral arteries.  CT C Spine Impression: 1. No evidence of fracture or acute traumatic injury. 2. Mild diffuse degenerative disc disease throughout the cervical spine.  CT Chest/Abd/Pelvis Impression: 1. Endotracheal tube tip in the proximal right mainstem bronchus; recommend retraction/repositioning. 2. Streaky dependent lower lobe atelectasis/consolidation bilaterally with scattered patchy reticular opacities in the right upper lobe. 3. Healing/healed fractures of the right anterior/lateral 4th through 9th ribs; no evidence of acute traumatic injury. 4. Cardiomegaly with moderate calcific coronary artery disease. 5. Hepatic steatosis and small exophytic right renal cysts. 6. Chronic mild-to-moderate L1 compression deformity and chronic bilateral L5 spondylolysis.  Medications Administered:  NaCl IVF Sodium bicarb amps Ativan  1mg  x 2 Tdap Thiamine  Sodium bicarb gtt 2g magnesium  Cefepime  Vanc Flagyl  Fentanyl  + propofol   PCCM consulted for admission due to respiratory failure requiring mechanical ventilation and circulatory shock requiring  vasopressors.  05/05/24- patient remains critically ill on MV.  Treating for pneumonia and antifreeze overdose. Ventilator management -weaned FiO2 on PRVC from 100 to 40%. SBT today. Unable to wean due to heavy mucus per ETT clogging circuit.  05/06/24- patient was performing SBT with sedation off he self extubated.  After appx 1 hour he was in severe resp distress and required re-intubation. Poison control is continuing to manage ethylene glycol poisoning and recommends continuing fomepizole .  05/07/24-patient on MV. Secretions are heavy,  BAL results no growth. He is still being treated for antifreeze poisoning.  05/08/24- propofol  and fentanyl  are off and precedex  still maximal dose infusing for awakening trial but patient is already aggitated 05/09/24- Failed sbt. I met with family today to review plan. They were present during SBT and saw patient struggling severely.  We briefly discussed potential trache in future.   Pertinent Medical History  Polysubstance abuse - cocaine, opioids Hypertension Hyperlipidemia CAD Anxiety Insomnia Hx of MI 2013 PTSD Tobacco use  Significant Hospital Events: Including procedures, antibiotic start and stop dates in addition to other pertinent events   1/15: Admitted for circulatory shock s/t suspected sepsis, severe hypothermia and acute renal failure with hypoxic respiratory failure requiring mechanical ventilation and vasopressor support. Positive for salicylates, poison control contacted and recommended started fomepizole  after sending off for a toxic ethanol level. PCCM consulted for admission. 05/04/24- Remains critically ill on MV.  Ordered workup for ethylene glycol poisoning.  Repeat EKG.  RVP. Continue abx. Weaned to 40% PRVC    Objective    Blood pressure (!) 161/78, pulse (!) 109, temperature 99.5 F (37.5 C), resp. rate 17, height 5' 8 (1.727 m), weight 97 kg, SpO2 93%.    Vent Mode: PRVC FiO2 (%):  [28 %-30 %] 28 % Set Rate:  [22 bmp] 22  bmp Vt Set:  [500 mL] 500 mL PEEP:  [5 cmH20] 5 cmH20 Plateau Pressure:  [13 cmH20-17 cmH20] 14 cmH20   Intake/Output Summary (Last 24 hours) at 05/09/2024 1104 Last data filed at 05/09/2024 1000 Gross per 24 hour  Intake 2187.85 ml  Output 3500 ml  Net -1312.15 ml   Filed Weights   05/07/24 0356 05/08/24 0345 05/09/24 0408  Weight: 99.4 kg 93.3 kg 97 kg    Examination: General: Adult male, critically ill, lying in bed intubated & sedated requiring mechanical ventilation, NAD HENT: anicteric sclerae, atraumatic, normocephalic, neck supple, no JVD CV: irregularly irregular, S1 S2, atrial fibrillation on monitor, no r/m/g Pulm: Regular, non labored on the ventilator , breath sounds equal bilaterally without wheezing, rhonchi or rales GI: soft, non-tender, mild distention, hypoactive bowels  Extremities: cool and dry, peripheral pulses present but thready, no significant edema noted Skin: multiple scattered abrasions on extremities, abdomen and back Neuro: intubated and sedated, not following commands, does not withdraw from pain, PERRL  GU: foley in place, with very minimal dark yellow urine    Assessment and Plan   #Acute Metabolic Encephalopathy #Polysubstance Abuse #Hypothermia Hx: Polysubstance Abuse, Insomnia, PTSD, Alcohol Abuse Head CT: negative for acute intracranial abnormality - -s/p extubation - refeeding  #Acute Hypoxic Respiratory Failure #CAP vs Aspiration Pneumonia #COPD PMHx: COPD #Circulatory Shock s/t Acute Renal Failure vs Sepsis #Elevated Troponin likely d/t Demand Ischemia #Atrial Fibrillation #Non-sustained Ventricular Tachycardia #Severe AGMA with + salicylates #Lactic Acidosis #Acute Renal Failure + Uremia #Rhabdomyolysis - Strict I/O's; inform provider if UO is <0.5/hr - Trend BMP, Mg and Phos - Avoid nephrotoxins as able - Ensure adequate renal perfusion - ICU electrolyte replacement protocol as indicated - Poison control contacted and  recommend repeat salicylate - Appreciate recommendations per Poison Control - Continue fomepizole  for suspected toxic alcohol ingestion - Continue IVFs and trend CK  - Likely will need CRRT - Nephrology consulted; appreciate input  #Transaminitis s/t Shock Liver #Hyperammonemia #Supratherapeutic INR - Trend hepatic function - AST/ALT: 3895/2503 on admission - Ammonia 40; consider lactulose if worsening - Diet: NPO - Constipation protocol prn - GI prophylaxis: Famotidine  20mg  per tube - Continue IVFs and trend CK - Given INR of 3.5, consider Vitamin K  #Thrombocytopenia - Trend CBC - Transfuse if Hgb < 7 or platelets < 7 - Monitor for s/sx of bleeding - VTE prophylaxis: SQ heparin , SCDs  #Type II Diabetes Mellitus - ICU hypo/hyperglycemia protocol - SSI - CBG Q4h - Goal range 140-180  #Suspected Sepsis with unknown etiology - CAP vs Aspiration Pneumonia Initial interventions: IVFs + flagly + vanc + cefepime  - Trend WBC and monitor fever curve - Blood cultures pending - Respiratory PCR: negative - Respiratory 20 pathogen panel ordered - Broad spectrum ABX: zosyn  and vanc - Narrow pending culture and sensitivities  Labs   CBC: Recent Labs  Lab 05/04/24 0318 05/04/24 0518 05/05/24 0345 05/06/24 0334 05/07/24 0423 05/08/24 0239 05/09/24 0351  WBC 9.3   < > 4.0 3.9* 6.7 5.7 6.0  NEUTROABS 7.3  --   --  1.5* 3.6  --   --  HGB 12.7*   < > 11.8* 12.1* 10.8* 12.0* 12.2*  HCT 40.8   < > 34.0* 36.5* 31.8* 36.4* 37.4*  MCV 102.3*   < > 92.9 96.1 94.1 97.1 98.2  PLT 103*   < > 87* 74* 89* 92* 126*   < > = values in this interval not displayed.    Basic Metabolic Panel: Recent Labs  Lab 05/05/24 0345 05/05/24 2221 05/06/24 0334 05/06/24 2020 05/07/24 0423 05/08/24 0239 05/09/24 0343 05/09/24 0351  NA 138  --  136  --  137 139  --  141  K 3.1*   < > 3.1* 4.6 5.4* 3.5  --  3.9  CL 99  --  101  --  104 103  --  105  CO2 30  --  26  --  22 25  --  27  GLUCOSE  113*  --  103*  --  101* 124*  --  119*  BUN 20  --  11  --  10 13  --  20  CREATININE 1.27*  --  0.94  --  0.90 0.81  --  0.92  CALCIUM  7.1*  --  7.5*  --  8.1* 8.4*  --  9.0  MG 2.2  --  2.0  --  2.0 2.1 2.3  --   PHOS 1.7*   < > 1.5* 3.4 2.7 3.0  --  3.5   < > = values in this interval not displayed.   GFR: Estimated Creatinine Clearance: 94 mL/min (by C-G formula based on SCr of 0.92 mg/dL). Recent Labs  Lab 05/04/24 0318 05/04/24 0518 05/05/24 0345 05/05/24 9360 05/06/24 0334 05/07/24 0423 05/08/24 0239 05/09/24 0351  PROCALCITON 63.20  --   --   --   --   --   --   --   WBC 9.3 8.3   < >  --  3.9* 6.7 5.7 6.0  LATICACIDVEN 3.7* >9.0*  --  1.8  --   --   --   --    < > = values in this interval not displayed.    Liver Function Tests: Recent Labs  Lab 05/04/24 0318 05/05/24 0332 05/06/24 0334 05/07/24 0423 05/08/24 0239  AST 3,895* 1,420*  --   --  123*  ALT 2,503* 1,398*  --   --  398*  ALKPHOS 128* 102  --   --  128*  BILITOT 1.0 1.2  --   --  1.7*  PROT 5.9* 4.8*  --   --  5.5*  ALBUMIN 3.4* 2.5* 2.5* 2.8* 2.8*   No results for input(s): LIPASE, AMYLASE in the last 168 hours. Recent Labs  Lab 05/04/24 0318  AMMONIA 40*    ABG    Component Value Date/Time   PHART 7.19 (LL) 05/06/2024 1300   PCO2ART 69 (HH) 05/06/2024 1300   PO2ART 104 05/06/2024 1300   HCO3 25.6 05/06/2024 1300   ACIDBASEDEF 3.6 (H) 05/06/2024 1300   O2SAT 98.4 05/06/2024 1300     Coagulation Profile: Recent Labs  Lab 05/04/24 0433  INR 3.5*    Cardiac Enzymes: Recent Labs  Lab 05/04/24 0318 05/04/24 0938 05/05/24 0332 05/08/24 0239  CKTOTAL 411* 677* 999* 80    HbA1C: Hgb A1c MFr Bld  Date/Time Value Ref Range Status  05/04/2024 05:18 AM 5.5 4.8 - 5.6 % Final    Comment:    (NOTE) Diagnosis of Diabetes The following HbA1c ranges recommended by the American Diabetes Association (ADA) may be  used as an aid in the diagnosis of diabetes  mellitus.  Hemoglobin             Suggested A1C NGSP%              Diagnosis  <5.7                   Non Diabetic  5.7-6.4                Pre-Diabetic  >6.4                   Diabetic  <7.0                   Glycemic control for                       adults with diabetes.    12/31/2020 11:28 AM 5.5 4.8 - 5.6 % Final    Comment:             Prediabetes: 5.7 - 6.4          Diabetes: >6.4          Glycemic control for adults with diabetes: <7.0     CBG: Recent Labs  Lab 05/08/24 1625 05/08/24 1932 05/08/24 2338 05/09/24 0350 05/09/24 0743  GLUCAP 147* 124* 130* 122* 127*    Review of Systems:   Unable to assess as patient is intubated and sedated.  Past Medical History:  He,  has a past medical history of Alcohol abuse, Anxiety, CA - skin cancer, CAD, residual CFX LAD disease (06/01/2011), Depression, Headache(784.0), Hyperlipemia, Hypertension, Insomnia, Mental disorder, MI (myocardial infarction) (HCC), Polysubstance abuse (HCC), and PTSD (post-traumatic stress disorder).   Surgical History:   Past Surgical History:  Procedure Laterality Date   CARDIAC CATHETERIZATION Bilateral 04/09/2015   Procedure: Coronary Angiogram;  Surgeon: Deatrice DELENA Cage, MD;  Location: [facility] INVASIVE CV LAB;  Service: Cardiovascular;  Laterality: Bilateral;   CARDIAC CATHETERIZATION [name] 04/09/2015   Procedure: Coronary Stent Intervention;  Surgeon: Deatrice DELENA Cage, MD;  Location: [facility] INVASIVE CV LAB;  Service: Cardiovascular;  Laterality: [name];   COLONOSCOPY WITH PROPOFOL  [name] 03/19/2019   Procedure: COLONOSCOPY WITH PROPOFOL ;  Surgeon: Unk Corinn Skiff, MD;  Location: [facility] ENDOSCOPY;  Service: Gastroenterology;  Laterality: [name];   LEFT HEART CATH Right 05/29/2011   Procedure: LEFT HEART CATH;  Surgeon: Alm LELON Clay, MD;  Location: [facility] CATH LAB;  Service: Cardiovascular;  Laterality: Right;   LEFT HEART CATHETERIZATION WITH CORONARY ANGIOGRAM [name] 06/01/2011   Procedure: LEFT HEART  CATHETERIZATION WITH CORONARY ANGIOGRAM;  Surgeon: Alm LELON Clay, MD;  Location: [facility] CATH LAB;  Service: Cardiovascular;  Laterality: [name];  relook cath, possible PCI   TONSILLECTOMY AND ADENOIDECTOMY       Social History:   reports that he has been smoking cigarettes. He has a 45 pack-year smoking history. He has never used smokeless tobacco. He reports that he does not drink alcohol and does not use drugs.   Family History:  His family history includes Anxiety disorder in his father and mother; Cancer in his mother; Chronic Renal Failure in his mother; Coronary artery disease (age of onset: 31) in his maternal grandfather; Depression in his father and mother; Other in his brother.   Allergies Allergies[1]   Home Medications  Prior to Admission medications  Medication Sig Start Date End Date Taking? Authorizing Provider  aspirin  81 MG EC tablet TAKE ONE TABLET BY MOUTH  EVERY DAY 01/09/16   Helon Kirsch A, PA-C  clonazePAM  (KLONOPIN ) 1 MG tablet Take 1 tablet (1 mg total) by mouth 3 (three) times daily. 04/23/24     clopidogrel  (PLAVIX ) 75 MG tablet Take 1 tablet (75 mg total) by mouth daily. 05/23/23     doxepin  (SINEQUAN ) 25 MG capsule Take 1-2 capsules (25-50 mg total) by mouth at bedtime. 03/26/24     gabapentin  (NEURONTIN ) 300 MG capsule Take 1 capsule (300 mg total) by mouth 3 (three) times daily. 02/14/24     gabapentin  (NEURONTIN ) 300 MG capsule Take 2 capsules (600 mg total) by mouth 3 (three) times daily. 04/05/24     hydrOXYzine  (VISTARIL ) 25 MG capsule Take 1 capsule (25 mg total) by mouth 2 (two) times daily. 03/12/24     hydrOXYzine  (VISTARIL ) 25 MG capsule Take 1 capsule (25 mg total) by mouth 2 (two) times daily. 04/23/24     lidocaine  (LIDODERM ) 5 % Place 1 patch onto the skin daily Apply patch to the most painful area for up to 12 hours in a 24 hour period. Patient taking differently: Place 1 patch onto the skin as directed. PRN 01/17/24     losartan  (COZAAR ) 100 MG tablet Take  1 tablet (100 mg total) by mouth daily. 10/17/23     losartan  (COZAAR ) 100 MG tablet Take 1 tablet (100 mg total) by mouth daily. 03/12/24     metFORMIN  (GLUCOPHAGE ) 500 MG tablet Take 1 tablet (500 mg total) by mouth daily with breakfast. 01/11/24     metoprolol  tartrate (LOPRESSOR ) 25 MG tablet Take 1 tablet (25 mg total) by mouth 2 (two) times daily. 05/23/23     QUEtiapine  (SEROQUEL ) 200 MG tablet Take 1 tablet (200 mg total) by mouth at bedtime as needed for insomnia. 01/18/24     QUEtiapine  (SEROQUEL ) 400 MG tablet Take 1 tablet (400 mg total) by mouth at bedtime. 02/01/24     QUEtiapine  (SEROQUEL ) 400 MG tablet Take 1 tablet (400 mg total) by mouth at bedtime 03/01/24     rosuvastatin  (CRESTOR ) 20 MG tablet Take 1 tablet (20 mg total) by mouth daily. 01/16/24     tirzepatide  (MOUNJARO ) 10 MG/0.5ML Pen Inject 10 mg into the skin once a week. 07/07/23     tirzepatide  (MOUNJARO ) 10 MG/0.5ML Pen Inject 10 mg into the skin once a week. 08/01/23     tirzepatide  (MOUNJARO ) 12.5 MG/0.5ML Pen Inject 12.5 mg into the skin once a week. 10/24/23     traMADol  (ULTRAM ) 50 MG tablet Take 1 tablet (50 mg total) by mouth 2 (two) times daily as needed. 12/14/23     traZODone  (DESYREL ) 150 MG tablet Take 1 tablet (150 mg total) by mouth at bedtime. 01/11/24     traZODone  (DESYREL ) 150 MG tablet Take 1 tablet (150 mg total) by mouth at bedtime. 03/12/24     umeclidinium-vilanterol (ANORO ELLIPTA ) 62.5-25 MCG/ACT AEPB Inhale 1 puff into the lungs daily. 02/08/24   Isadora Hose, MD  ARIPiprazole  (ABILIFY ) 2 MG tablet Take 1 tablet (2 mg total) by mouth once daily for 120 days 09/01/23 12/08/23       Critical care provider statement:   Total critical care time: 33 minutes   Performed by: Parris MD   Critical care time was exclusive of separately billable procedures and treating other patients.   Critical care was necessary to treat or prevent imminent or life-threatening deterioration.   Critical care was time  spent personally by me on the following activities: development  of treatment plan with patient and/or surrogate as well as nursing, discussions with consultants, evaluation of patient's response to treatment, examination of patient, obtaining history from patient or surrogate, ordering and performing treatments and interventions, ordering and review of laboratory studies, ordering and review of radiographic studies, pulse oximetry and re-evaluation of patient's condition.    Ettore Trebilcock, M.D.  Pulmonary & Critical Care Medicine                   [1] No Known Allergies  "

## 2024-05-09 NOTE — Consult Note (Signed)
 Pharmacy Consult Note - Electrolytes  Sodium (mmol/L)  Date Value  05/09/2024 141  02/11/2022 136  03/07/2014 143   Potassium (mmol/L)  Date Value  05/09/2024 3.9  03/07/2014 3.2 (L)   Magnesium  (mg/dL)  Date Value  98/79/7973 2.1  03/04/2014 2.3   Calcium  (mg/dL)  Date Value  98/78/7973 9.0   Calcium , Total (mg/dL)  Date Value  88/80/7984 7.4 (L)   Albumin (g/dL)  Date Value  98/79/7973 2.8 (L)  10/22/2020 4.2  03/07/2014 3.4   Phosphorus (mg/dL)  Date Value  98/78/7973 3.5    ASSESSMENT: 64 y.o. male with PMH including CAD/STEMI s/p DES (2012), alcohol abuse presents with hypothermia after being found unresponsive outside in cold. Pharmacy has been consulted to monitor and replace electrolytes. Patient's renal function is currently poor because he is in AKI > AKI resolved. Patient with new Afib w/ RVR on EKG 1/19  Lab Results  Component Value Date   CREATININE 0.92 05/09/2024   CREATININE 0.81 05/08/2024   CREATININE 0.90 05/07/2024    mIVF: [name]  Pertinent medications: n/a  Goal of Therapy:  Potassium 4 - 5.1 Magnesium  > 2  PLAN: Potassium 3.9 today following Kcl 20mEq x1 yesterday Administer Kcl 20mEq per tube F/u with AM labs.   Thank you for allowing pharmacy to be a part of this patients care.  Belvie Macintosh, PharmD Candidate 05/09/2024 8:01 AM

## 2024-05-10 LAB — BASIC METABOLIC PANEL WITH GFR
Anion gap: 9 (ref 5–15)
BUN: 28 mg/dL — ABNORMAL HIGH (ref 8–23)
CO2: 27 mmol/L (ref 22–32)
Calcium: 9.3 mg/dL (ref 8.9–10.3)
Chloride: 108 mmol/L (ref 98–111)
Creatinine, Ser: 0.78 mg/dL (ref 0.61–1.24)
GFR, Estimated: >60 mL/min
Glucose, Bld: 131 mg/dL — ABNORMAL HIGH (ref 70–99)
Potassium: 4 mmol/L (ref 3.5–5.1)
Sodium: 144 mmol/L (ref 135–145)

## 2024-05-10 LAB — BLOOD GAS, ARTERIAL
Acid-Base Excess: 7.6 mmol/L — ABNORMAL HIGH (ref 0.0–2.0)
Bicarbonate: 32 mmol/L — ABNORMAL HIGH (ref 20.0–28.0)
FIO2: 28 %
MECHVT: 500 mL
Mechanical Rate: 22
O2 Saturation: 97.8 %
PEEP: 5 cmH2O
Patient temperature: 37
pCO2 arterial: 43 mmHg (ref 32–48)
pH, Arterial: 7.48 — ABNORMAL HIGH (ref 7.35–7.45)
pO2, Arterial: 72 mmHg — ABNORMAL LOW (ref 83–108)

## 2024-05-10 LAB — HEPATIC FUNCTION PANEL
ALT: 201 U/L — ABNORMAL HIGH (ref 0–44)
AST: 61 U/L — ABNORMAL HIGH (ref 15–41)
Albumin: 2.9 g/dL — ABNORMAL LOW (ref 3.5–5.0)
Alkaline Phosphatase: 116 U/L (ref 38–126)
Bilirubin, Direct: 0.7 mg/dL — ABNORMAL HIGH (ref 0.0–0.2)
Indirect Bilirubin: 0.3 mg/dL (ref 0.3–0.9)
Total Bilirubin: 0.9 mg/dL (ref 0.0–1.2)
Total Protein: 6.1 g/dL — ABNORMAL LOW (ref 6.5–8.1)

## 2024-05-10 LAB — CBC
HCT: 38.6 % — ABNORMAL LOW (ref 39.0–52.0)
Hemoglobin: 12.4 g/dL — ABNORMAL LOW (ref 13.0–17.0)
MCH: 32.1 pg (ref 26.0–34.0)
MCHC: 32.1 g/dL (ref 30.0–36.0)
MCV: 100 fL (ref 80.0–100.0)
Platelets: 135 K/uL — ABNORMAL LOW (ref 150–400)
RBC: 3.86 MIL/uL — ABNORMAL LOW (ref 4.22–5.81)
RDW: 16.9 % — ABNORMAL HIGH (ref 11.5–15.5)
WBC: 7.1 K/uL (ref 4.0–10.5)
nRBC: 0 % (ref 0.0–0.2)

## 2024-05-10 LAB — PHOSPHORUS: Phosphorus: 3.6 mg/dL (ref 2.5–4.6)

## 2024-05-10 LAB — PROTIME-INR
INR: 1.1 (ref 0.8–1.2)
Prothrombin Time: 15.2 s (ref 11.4–15.2)

## 2024-05-10 LAB — GLUCOSE, CAPILLARY
Glucose-Capillary: 119 mg/dL — ABNORMAL HIGH (ref 70–99)
Glucose-Capillary: 121 mg/dL — ABNORMAL HIGH (ref 70–99)
Glucose-Capillary: 121 mg/dL — ABNORMAL HIGH (ref 70–99)
Glucose-Capillary: 125 mg/dL — ABNORMAL HIGH (ref 70–99)
Glucose-Capillary: 127 mg/dL — ABNORMAL HIGH (ref 70–99)
Glucose-Capillary: 128 mg/dL — ABNORMAL HIGH (ref 70–99)
Glucose-Capillary: 147 mg/dL — ABNORMAL HIGH (ref 70–99)

## 2024-05-10 LAB — MAGNESIUM: Magnesium: 2.6 mg/dL — ABNORMAL HIGH (ref 1.7–2.4)

## 2024-05-10 MED ORDER — ENOXAPARIN SODIUM 60 MG/0.6ML IJ SOSY
45.0000 mg | PREFILLED_SYRINGE | INTRAMUSCULAR | Status: DC
  Administered 2024-05-10 – 2024-05-11 (×2): 45 mg via SUBCUTANEOUS
  Filled 2024-05-10 (×3): qty 0.6

## 2024-05-10 MED ORDER — FUROSEMIDE 10 MG/ML IJ SOLN
40.0000 mg | Freq: Once | INTRAMUSCULAR | Status: AC
  Administered 2024-05-10: 40 mg via INTRAVENOUS
  Filled 2024-05-10: qty 4

## 2024-05-10 MED ORDER — CHLORHEXIDINE GLUCONATE CLOTH 2 % EX PADS
6.0000 | MEDICATED_PAD | Freq: Every day | CUTANEOUS | Status: DC
  Administered 2024-05-10 – 2024-05-12 (×2): 6 via TOPICAL

## 2024-05-10 NOTE — IPAL (Signed)
" °  Interdisciplinary Goals of Care Family Meeting   Date carried out: 05/10/2024  Location of the meeting: Bedside  Member's involved: Nurse Practitioner, Bedside Registered Nurse, Family Member or next of kin, and Palliative care team member  Durable Power of Attorney or acting medical decision maker: Pts father Shai Mckenzie, Cousin [employer], and additional family members      Discussion: We discussed goals of care for [employer] .  Pts father stated the pt would NOT want a tracheostomy or PEG tube.  Following goals of care conversation pts father decided he would like to proceed with 1-way extubation once pt is medically optimized, and if pt fails extubation will transition pt to comfort measures only.  He also decided to change pts code status to DNR (no CPR or chest compressions)   Code status:   Code Status: Do not attempt resuscitation (DNR) PRE-ARREST INTERVENTIONS DESIRED   Disposition: Continue current acute care  Time spent for the meeting: 15 minutes    Lonell Moose, AGNP  Pulmonary/Critical Care Pager [phone] (please enter 7 digits) PCCM Consult Pager [phone] (please enter 7 digits)    "

## 2024-05-10 NOTE — Consult Note (Addendum)
 Pharmacy Consult Note - Electrolytes  Sodium (mmol/L)  Date Value  05/10/2024 144  02/11/2022 136  03/07/2014 143   Potassium (mmol/L)  Date Value  05/10/2024 4.0  03/07/2014 3.2 (L)   Magnesium  (mg/dL)  Date Value  98/77/7973 2.6 (H)  03/04/2014 2.3   Calcium  (mg/dL)  Date Value  98/77/7973 9.3   Calcium , Total (mg/dL)  Date Value  88/80/7984 7.4 (L)   Albumin (g/dL)  Date Value  98/77/7973 2.9 (L)  10/22/2020 4.2  03/07/2014 3.4   Phosphorus (mg/dL)  Date Value  98/77/7973 3.6    ASSESSMENT: 64 y.o. male with PMH including CAD/STEMI s/p DES (2012), alcohol abuse presents with hypothermia after being found unresponsive outside in cold. Pharmacy has been consulted to monitor and replace electrolytes. Patient's renal function is currently poor because he is in AKI > AKI resolved. Patient with new Afib w/ RVR on EKG 1/19  Lab Results  Component Value Date   CREATININE 0.78 05/10/2024   CREATININE 0.92 05/09/2024   CREATININE 0.81 05/08/2024    mIVF: [name]  Diet: tube feeds 49mL/hr + feeding supplement TID + free water  30mL Q4h  Pertinent medications: n/a  Goal of Therapy:  Potassium 4 - 5.1 Magnesium  > 2  PLAN: No repletion indicated at this time F/u with AM labs.   Thank you for allowing pharmacy to be a part of this patients care.  Belvie Macintosh, PharmD Candidate 05/10/2024 8:06 AM

## 2024-05-10 NOTE — TOC Progression Note (Addendum)
 Transition of Care [facility]) - Progression Note    Patient Details  Name: Larry French MRN: [id number] Date of Birth: [birth year]  Transition of Care [facility]) CM/SW Contact  Lauraine JAYSON Carpen, LCSW Phone Number: 05/10/2024, 3:33 PM  Clinical Narrative:   CSW left voicemail for APS.  4:34 pm: Received call back from APS social worker. Provided update.  Expected Discharge Plan and Services                                               Social Drivers of Health (SDOH) Interventions SDOH Screenings   Food Insecurity: No Food Insecurity (01/11/2024)   Received from [facility]  Recent Concern: Food Insecurity - Food Insecurity Present (11/02/2023)   Received from [facility]  Housing: Low Risk  (01/11/2024)   Received from [facility]  Transportation Needs: No Transportation Needs (01/11/2024)   Received from [facility]  Recent Concern: Transportation Needs - Unmet Transportation Needs (11/02/2023)   Received from [facility]  Utilities: Not At Risk (01/11/2024)   Received from [facility]  Alcohol Screen: Low Risk (02/11/2022)  Depression (PHQ2-9): Medium Risk (02/11/2022)  Financial Resource Strain: Low Risk  (01/11/2024)   Received from [facility]  Physical Activity: Sufficiently Active (02/11/2022)  Social Connections: Moderately Isolated (02/11/2022)  Stress: Stress Concern Present (02/11/2022)  Tobacco Use: High Risk (05/04/2024)    Readmission Risk Interventions     No data to display

## 2024-05-10 NOTE — Plan of Care (Signed)
" °  Problem: Fluid Volume: Goal: Ability to maintain a balanced intake and output will improve Outcome: Progressing   Problem: Metabolic: Goal: Ability to maintain appropriate glucose levels will improve Outcome: Progressing   Problem: Nutritional: Goal: Maintenance of adequate nutrition will improve Outcome: Progressing   Problem: Tissue Perfusion: Goal: Adequacy of tissue perfusion will improve Outcome: Progressing   Problem: Coping: Goal: Ability to adjust to condition or change in health will improve Outcome: Not Progressing   Problem: Health Behavior/Discharge Planning: Goal: Ability to manage health-related needs will improve Outcome: Not Progressing   "

## 2024-05-10 NOTE — Plan of Care (Signed)
" °  Problem: Education: Goal: Ability to describe self-care measures that may prevent or decrease complications (Diabetes Survival Skills Education) will improve Outcome: Not Progressing   Problem: Coping: Goal: Ability to adjust to condition or change in health will improve Outcome: Not Progressing   Problem: Fluid Volume: Goal: Ability to maintain a balanced intake and output will improve Outcome: Progressing   Problem: Health Behavior/Discharge Planning: Goal: Ability to manage health-related needs will improve Outcome: Not Progressing   Problem: Metabolic: Goal: Ability to maintain appropriate glucose levels will improve Outcome: Progressing   Problem: Nutritional: Goal: Maintenance of adequate nutrition will improve Outcome: Progressing   Problem: Skin Integrity: Goal: Risk for impaired skin integrity will decrease Outcome: Progressing   Problem: Tissue Perfusion: Goal: Adequacy of tissue perfusion will improve Outcome: Progressing   "

## 2024-05-10 NOTE — Progress Notes (Deleted)
 "                                                                                                                                                                                                          Daily Progress Note   Patient Name: Larry French       Date: 05/10/2024 DOB: 02-May-1960  Age: 64 y.o. MRN#: [id number] Attending Physician: Parris Manna, MD Primary Care Physician: [name], MD Admit Date: 05/04/2024  Reason for Consultation/Follow-up: Establishing goals of care  Subjective: Notes, labs, and diagnostics reviewed.  In to see patient.  He is currently resting in bed with his father and 2 cousins at bedside.  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  Dr. Parris entered conversation and I stepped out with Jenna.  Jenna discusses questions of patient's presentation prior to coming to the hospital.  She discusses that there was blood on the ground where he was laying as well as some blood in the house.  She discusses concern for the amount of time that he laid on the ground before 911 was called by her husband.  Spoke with CCM; Jenna to nurses station to recap the above and discuss further with CCM and myself.    TOC was called and came to unit, and case was discussed in great depth.  TOC will continue to work with DSS.  CCM Lonell NP and myself, and nurse returned to bedside to speak with family.  In-depth discussion with status recapped. Father is clear that they do not want the patient to suffer.  He is clear that the patient would not want tracheostomy.  We discussed patient's labs and diagnostics.  With in-depth discussion, family would like to provide more time to medically optimize prior to one-way extubation.   Length of Stay: 6  Current Medications: Scheduled Meds:   B-complex with vitamin C  1 tablet Per Tube Daily   Chlorhexidine  Gluconate Cloth  6 each Topical Daily   clonazePAM   1 mg Per Tube TID   enoxaparin  (LOVENOX ) injection  45 mg Subcutaneous Q24H   famotidine    20 mg Per Tube BID   feeding supplement (PROSource TF20)  60 mL Per Tube TID   feeding supplement (VITAL HIGH PROTEIN)  1,000 mL Per Tube Q24H   folic acid   1 mg Oral Daily   free water   30 mL Per Tube Q4H   furosemide   40 mg Intravenous Once   insulin  aspart  0-15 Units Subcutaneous Q4H   multivitamin with minerals  1 tablet Oral Daily   nutrition  supplement (JUVEN)  1 packet Per Tube BID BM   mouth rinse  15 mL Mouth Rinse Q2H   polyethylene glycol  17 g Per Tube Daily   senna  1 tablet Per Tube BID   thiamine   100 mg Oral Daily   Or   thiamine   100 mg Intravenous Daily    Continuous Infusions:  fentaNYL  infusion INTRAVENOUS 125 mcg/hr (05/10/24 1200)   norepinephrine  (LEVOPHED ) Adult infusion 3 mcg/min (05/06/24 1839)   piperacillin -tazobactam (ZOSYN )  IV 3.375 g (05/10/24 1310)   propofol  (DIPRIVAN ) infusion 40 mcg/kg/min (05/10/24 1308)   valproate sodium  500 mg (05/10/24 1354)   vasopressin  Stopped (05/04/24 0747)    PRN Meds: acetaminophen , fentaNYL , hydrALAZINE , ipratropium-albuterol , metoprolol  tartrate, mouth rinse, polyethylene glycol  Physical Exam Constitutional:      Comments: Eyes closed.  Sedated  Pulmonary:     Comments: On ventilator            Vital Signs: BP 114/65   Pulse (!) 53   Temp 98.7 F (37.1 C) (Oral)   Resp (!) 22   Ht 5' 8 (1.727 m)   Wt 99 kg   SpO2 99%   BMI 33.19 kg/m  SpO2: SpO2: 99 % O2 Device: O2 Device: Ventilator O2 Flow Rate: O2 Flow Rate (L/min): 5 L/min  Intake/output summary:  Intake/Output Summary (Last 24 hours) at 05/10/2024 1444 Last data filed at 05/10/2024 1200 Gross per 24 hour  Intake 2146.95 ml  Output 1000 ml  Net 1146.95 ml   LBM: Last BM Date : 05/08/24 Baseline Weight: Weight: 101.2 kg Most recent weight: Weight: 99 kg  Patient Active Problem List   Diagnosis Date Noted   Altered mental status 05/06/2024   Pressure injury of skin 05/06/2024   Acute respiratory failure (HCC) 05/04/2024   Cerumen  impaction 04/29/2021   Controlled type 2 diabetes mellitus without complication, without long-term current use of insulin  (HCC) 01/28/2021   Fall 01/10/2020   Insomnia 12/05/2019   Anxiety 08/14/2019   Numbness and tingling 08/14/2019   Fleshy skin mole 02/28/2019   Elevated liver enzymes 10/17/2018   Hearing loss 10/17/2018   Erectile dysfunction 10/17/2018   Type 2 diabetes mellitus with hyperglycemia, without long-term current use of insulin  (HCC) 03/22/2017   Hypertension 11/04/2016   Healthcare maintenance 11/04/2016   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   Benzodiazepine dependence (HCC) 04/25/2015   Alcohol abuse with alcohol-induced mood disorder (HCC) 04/22/2015   GAD (generalized anxiety disorder)    Severe episode of recurrent major depressive disorder, without psychotic features (HCC)    Alcohol abuse 04/17/2015   Benzodiazepine abuse (HCC) 04/17/2015   Posttraumatic stress disorder 04/17/2015   Suicidal ideation 04/17/2015   Abnormal EKG    Benzodiazepine withdrawal (HCC)    NSTEMI (non-ST elevated myocardial infarction) (HCC)    Positive cardiac stress test    Elevated troponin    Withdrawal from benzodiazepine (HCC) 04/05/2015   STEMI, RCA DES 05/28/10 06/01/2011   CAD, residual CFX LAD disease 06/01/2011   Tobacco abuse 05/29/2011   History of ETOH abuse and Xanax  abuse. In rehab pta 05/29/2011    Palliative Care Assessment & Plan    Recommendations/Plan: Father would like more time for outcomes.  Day 6 of intubation.   Code Status:    Code Status Orders  (From admission, onward)           Start     Ordered   05/10/24 1438  Do not attempt resuscitation (DNR)  Pre-Arrest Interventions Desired  (Code Status)  Continuous       Question Answer Comment  If pulseless and not breathing No CPR or chest compressions.   In Pre-Arrest Conditions (Patient Has Pulse and Is Breathing) May intubate, use advanced airway interventions  and cardioversion/ACLS medications if appropriate or indicated. May transfer to ICU.   Consent: Discussion documented in EHR or advanced directives reviewed      05/10/24 1437           Code Status History     Date Active Date Inactive Code Status Order ID Comments User Context   05/04/2024 0414 05/10/2024 1437 Full Code [id number]  Terrace Robet RIGGERS ED   06/19/2015 0110 06/26/2015 1939 Full Code [id number]  Ray Jacques FORBES RIGGERS Inpatient   04/22/2015 2156 04/29/2015 1854 Full Code [id number]  Ray Jacques FORBES RIGGERS Inpatient   04/09/2015 1219 04/10/2015 1425 Full Code [id number]  Darron Deatrice LABOR, MD Inpatient   04/05/2015 2048 04/09/2015 1219 Full Code [id number]  Tobie Press, MD Inpatient       Care plan was discussed with CCM and TOC  Thank you for allowing the Palliative Medicine Team to assist in the care of this patient.   Time In: 1:00 Time Out: 3:00 Total Time 2 hours Prolonged Time Billed  yes       Greater than 50%  of this time was spent counseling and coordinating care related to the above assessment and plan.  Camelia Lewis, NP  Please contact Palliative Medicine Team phone at [phone] for questions and concerns.       "

## 2024-05-10 NOTE — Progress Notes (Signed)
 "  NAME:  Larry French, MRN:  [id number], DOB:  03/17/1961, LOS: 6 ADMISSION DATE:  05/04/2024, CONSULTATION DATE:  05/03/24 REFERRING MD:  Dr. Neomi, CHIEF COMPLAINT:  AMS   History of Present Illness:  Larry French is a 64 y.o. male with history of COPD, alcohol and benzodiazepine abuse, hypertension, hyperlipidemia and CAD who presented to the [facility] ED after he was found down outside of his father's house with altered mental status.    History gathered per chart review EMS reports they had an axillary temperature of 88, and were unable to get an oxygen saturation or blood pressure at the time of arrival on scene.  Blood glucose was in the low 100s.  It was reported that the patient was awake and breathing on his own but unable to answer questions or following commands.  Intermittently agitated. Per EMS, father told him that if he wanted to come inside he would have to crawl inside. On arrival to the home, it was reported he was found outside naked with various abrasions scattered throughout his body after attempting to crawl inside.  It is unknown how long he was outside. The patient did not make it inside. His father found him and called 911.   The father states that the last time he saw patient was around suppertime, around 5 to 6 PM, stating he was normal at that time. He states that he woke up around 1:30 AM and noticed that his bedroom light was on and found the patient lying on the ground outside of their carport in the gravel driveway. States he was awake but could not talk much. When asked why he thought the patient was unable to talk father states that because he was cold. His father does not think that he was intoxicated but states that he does not know what medications the patient takes or if he has used any drugs or alcohol recently. It is reported the last time he notes that the patient was actively drinking was about 2 weeks ago. He states the patient did fall about 2  days ago because he loses his balance often. Denied any other known injury. No known recent fevers, cough, vomiting or diarrhea. He does not think that the patient had any toxic alcohols or illicit drugs but is not sure. He does report that the patient takes Anastacia powders regularly. Denies that the patient has had any recent suicidal thoughts and does not think that this was a suicide attempt.   ED course: Upon arrival to the ED, the patient was profoundly hypotensive requiring emergent central line placement for vasopressor support given difficulty with access. Although he was lethargic but awake on arrival, he did become more lethargic and agitated requiring sedation and ultimately was emergently intubated at bedside by the EDP for airway protection. He was put on the [facility] and started on warmed IVFs with heated packs to assist with severe hypothermia. He was found to have severe AGMA and was given 3 amps of bicarb followed by a bicarb gtt. Mild lactic acidosis iso sepsis but found to be + for salicylates. Poison control contacted and directed to start fomepizole  for potential toxic ethanol ingestion. Nephrology contacted with concern for the need of CRRT and plan to see him during the day.   Vitals on Arrival: temp: 85.4 F, BP 61/45, HR 82, RR 16, SpO2 78%  Pertinent Labs/Diagnostics: Found to be with profound AG metabolic acidosis and lactic acidosis iso of acute renal failure,  rhabdomyolysis and with a supratherapeutic INR.  LILLETTE Robet Kim, PA-C personally viewed and interpreted this ECG. EKG Interpretation: Date: 05/03/24, EKG Time: 0320, Rate: 102, Rhythm: irregularly irregular, QRS Duration:  131 Intervals: prolonged QT interval, ST/T Wave abnormalities: severe global ischemia without acute ST elevation, Narrative Interpretation: atrial fibrillation, PVCs, with severe global ischemia, hx of inferior STEMI  Chemistry: Na+:132, K+: 436, BUN/Cr.: 37/3.53, Serum CO2/ AG: 10/28, Mg: 3.1, Alk  Phos 128, AST/ALT: 3895/2503, CK 411, Beta Hydoxy: 0.93, Glucose: 260  Hematology: WBC: 9.3, Hgb: 12.7, Platelets 103, INR 3.5 Troponin: 32, Lactic/ PCT: 3.7 >> >9.0/ 63.20, COVID-19 & Influenza A/B: negative ABG: ph 6.95 pCO2 56, pO2 44, acid base deficit 20.9, bicarb 11.8  Ethanol: negative UDS: + benzos  CXR Impression :  1. Patchy airspace consolidation in the base of both lungs, consistent with pneumonia or aspiration. Mid and upper lungs remain clear. 2. Endotracheal tube and nasogastric tube are in satisfactory position.  CT Head Impression:  1. No acute intracranial abnormality. 2. Chronic microvascular ischemic disease in bilateral cerebral hemispheres deep and periventricular white matter. 3. Chronic left thalamic lacunar infarctions. 4. Atherosclerotic calcifications within the cavernous internal carotid and vertebral arteries.  CT C Spine Impression: 1. No evidence of fracture or acute traumatic injury. 2. Mild diffuse degenerative disc disease throughout the cervical spine.  CT Chest/Abd/Pelvis Impression: 1. Endotracheal tube tip in the proximal right mainstem bronchus; recommend retraction/repositioning. 2. Streaky dependent lower lobe atelectasis/consolidation bilaterally with scattered patchy reticular opacities in the right upper lobe. 3. Healing/healed fractures of the right anterior/lateral 4th through 9th ribs; no evidence of acute traumatic injury. 4. Cardiomegaly with moderate calcific coronary artery disease. 5. Hepatic steatosis and small exophytic right renal cysts. 6. Chronic mild-to-moderate L1 compression deformity and chronic bilateral L5 spondylolysis.  Medications Administered:  NaCl IVF Sodium bicarb amps Ativan  1mg  x 2 Tdap Thiamine  Sodium bicarb gtt 2g magnesium  Cefepime  Vanc Flagyl  Fentanyl  + propofol   PCCM consulted for admission due to respiratory failure requiring mechanical ventilation and circulatory shock requiring  vasopressors.  05/05/24- patient remains critically ill on MV.  Treating for pneumonia and antifreeze overdose. Ventilator management -weaned FiO2 on PRVC from 100 to 40%. SBT today. Unable to wean due to heavy mucus per ETT clogging circuit.  05/06/24- patient was performing SBT with sedation off he self extubated.  After appx 1 hour he was in severe resp distress and required re-intubation. Poison control is continuing to manage ethylene glycol poisoning and recommends continuing fomepizole .  05/07/24-patient on MV. Secretions are heavy,  BAL results no growth. He is still being treated for antifreeze poisoning.  05/08/24- propofol  and fentanyl  are off and precedex  still maximal dose infusing for awakening trial but patient is already aggitated 05/09/24- Failed sbt. I met with family today to review plan. They were present during SBT and saw patient struggling severely.  We briefly discussed potential trache in future.  05/10/24- patient had failed SBT yesterday and previously failed self extubation.  Family wants to meet with palliative and shares patients wishes are DNR/DNI and he requested palliative extubation.  We will have palliative team meet with family today.   Pertinent Medical History  Polysubstance abuse - cocaine, opioids Hypertension Hyperlipidemia CAD Anxiety Insomnia Hx of MI 2013 PTSD Tobacco use  Significant Hospital Events: Including procedures, antibiotic start and stop dates in addition to other pertinent events   1/15: Admitted for circulatory shock s/t suspected sepsis, severe hypothermia and acute renal failure with hypoxic respiratory failure requiring mechanical  ventilation and vasopressor support. Positive for salicylates, poison control contacted and recommended started fomepizole  after sending off for a toxic ethanol level. PCCM consulted for admission. 05/04/24- Remains critically ill on MV.  Ordered workup for ethylene glycol poisoning.  Repeat EKG.  RVP. Continue  abx. Weaned to 40% PRVC    Objective    Blood pressure (!) 101/58, pulse (!) 55, temperature 98.1 F (36.7 C), temperature source Axillary, resp. rate (!) 22, height 5' 8 (1.727 m), weight 99 kg, SpO2 96%.    Vent Mode: PRVC FiO2 (%):  [28 %] 28 % Set Rate:  [22 bmp] 22 bmp Vt Set:  [500 mL] 500 mL PEEP:  [5 cmH20] 5 cmH20 Plateau Pressure:  [19 cmH20] 19 cmH20   Intake/Output Summary (Last 24 hours) at 05/10/2024 0850 Last data filed at 05/10/2024 0600 Gross per 24 hour  Intake 2030.07 ml  Output 1000 ml  Net 1030.07 ml   Filed Weights   05/08/24 0345 05/09/24 0408 05/10/24 0500  Weight: 93.3 kg 97 kg 99 kg    Examination: General: Adult male, critically ill, lying in bed intubated & sedated requiring mechanical ventilation, NAD HENT: anicteric sclerae, atraumatic, normocephalic, neck supple, no JVD CV: irregularly irregular, S1 S2, atrial fibrillation on monitor, no r/m/g Pulm: Regular, non labored on the ventilator , breath sounds equal bilaterally without wheezing, rhonchi or rales GI: soft, non-tender, mild distention, hypoactive bowels  Extremities: cool and dry, peripheral pulses present but thready, no significant edema noted Skin: multiple scattered abrasions on extremities, abdomen and back Neuro: intubated and sedated, not following commands, does not withdraw from pain, PERRL  GU: foley in place, with very minimal dark yellow urine    Assessment and Plan   #Acute Metabolic Encephalopathy #Polysubstance Abuse #Hypothermia Hx: Polysubstance Abuse, Insomnia, PTSD, Alcohol  Abuse Head CT: negative for acute intracranial abnormality - -s/p extubation - refeeding  #Acute Hypoxic Respiratory Failure #CAP vs Aspiration Pneumonia #COPD PMHx: COPD #Circulatory Shock s/t Acute Renal Failure vs Sepsis #Elevated Troponin likely d/t Demand Ischemia #Atrial Fibrillation #Non-sustained Ventricular Tachycardia #Severe AGMA with + salicylates #Lactic  Acidosis #Acute Renal Failure + Uremia #Rhabdomyolysis - Strict I/O's; inform provider if UO is <0.5/hr - Trend BMP, Mg and Phos - Avoid nephrotoxins as able - Ensure adequate renal perfusion - ICU electrolyte replacement protocol as indicated - Poison control contacted and recommend repeat salicylate - Appreciate recommendations per Poison Control - Continue fomepizole  for suspected toxic alcohol  ingestion - Continue IVFs and trend CK  - Likely will need CRRT - Nephrology consulted; appreciate input  #Transaminitis s/t Shock Liver #Hyperammonemia #Supratherapeutic INR - Trend hepatic function - AST/ALT: 3895/2503 on admission - Ammonia 40; consider lactulose if worsening - Diet: NPO - Constipation protocol prn - GI prophylaxis: Famotidine  20mg  per tube - Continue IVFs and trend CK - Given INR of 3.5, consider Vitamin K  #Thrombocytopenia - Trend CBC - Transfuse if Hgb < 7 or platelets < 7 - Monitor for s/sx of bleeding - VTE prophylaxis: SQ heparin , SCDs  #Type II Diabetes Mellitus - ICU hypo/hyperglycemia protocol - SSI - CBG Q4h - Goal range 140-180  #Suspected Sepsis with unknown etiology - CAP vs Aspiration Pneumonia Initial interventions: IVFs + flagly + vanc + cefepime  - Trend WBC and monitor fever curve - Blood cultures pending - Respiratory PCR: negative - Respiratory 20 pathogen panel ordered - Broad spectrum ABX: zosyn  and vanc - Narrow pending culture and sensitivities  Labs   CBC: Recent Labs  Lab 05/04/24 0318  05/04/24 0518 05/06/24 0334 05/07/24 0423 05/08/24 0239 05/09/24 0351 05/10/24 0240  WBC 9.3   < > 3.9* 6.7 5.7 6.0 7.1  NEUTROABS 7.3  --  1.5* 3.6  --   --   --   HGB 12.7*   < > 12.1* 10.8* 12.0* 12.2* 12.4*  HCT 40.8   < > 36.5* 31.8* 36.4* 37.4* 38.6*  MCV 102.3*   < > 96.1 94.1 97.1 98.2 100.0  PLT 103*   < > 74* 89* 92* 126* 135*   < > = values in this interval not displayed.    Basic Metabolic Panel: Recent Labs  Lab  05/06/24 0334 05/06/24 2020 05/07/24 0423 05/08/24 0239 05/09/24 0343 05/09/24 0351 05/10/24 0240  NA 136  --  137 139  --  141 144  K 3.1* 4.6 5.4* 3.5  --  3.9 4.0  CL 101  --  104 103  --  105 108  CO2 26  --  22 25  --  27 27  GLUCOSE 103*  --  101* 124*  --  119* 131*  BUN 11  --  10 13  --  20 28*  CREATININE 0.94  --  0.90 0.81  --  0.92 0.78  CALCIUM  7.5*  --  8.1* 8.4*  --  9.0 9.3  MG 2.0  --  2.0 2.1 2.3  --  2.6*  PHOS 1.5* 3.4 2.7 3.0  --  3.5 3.6   GFR: Estimated Creatinine Clearance: 109.1 mL/min (by C-G formula based on SCr of 0.78 mg/dL). Recent Labs  Lab 05/04/24 0318 05/04/24 0518 05/05/24 0345 05/05/24 9360 05/06/24 0334 05/07/24 0423 05/08/24 0239 05/09/24 0351 05/10/24 0240  PROCALCITON 63.20  --   --   --   --   --   --   --   --   WBC 9.3 8.3   < >  --    < > 6.7 5.7 6.0 7.1  LATICACIDVEN 3.7* >9.0*  --  1.8  --   --   --   --   --    < > = values in this interval not displayed.    Liver Function Tests: Recent Labs  Lab 05/04/24 0318 05/05/24 0332 05/06/24 0334 05/07/24 0423 05/08/24 0239 05/10/24 0240  AST 3,895* 1,420*  --   --  123* 61*  ALT 2,503* 1,398*  --   --  398* 201*  ALKPHOS 128* 102  --   --  128* 116  BILITOT 1.0 1.2  --   --  1.7* 0.9  PROT 5.9* 4.8*  --   --  5.5* 6.1*  ALBUMIN 3.4* 2.5* 2.5* 2.8* 2.8* 2.9*   No results for input(s): LIPASE, AMYLASE in the last 168 hours. Recent Labs  Lab 05/04/24 0318  AMMONIA 40*    ABG    Component Value Date/Time   PHART 7.19 (LL) 05/06/2024 1300   PCO2ART 69 (HH) 05/06/2024 1300   PO2ART 104 05/06/2024 1300   HCO3 25.6 05/06/2024 1300   ACIDBASEDEF 3.6 (H) 05/06/2024 1300   O2SAT 98.4 05/06/2024 1300     Coagulation Profile: Recent Labs  Lab 05/04/24 0433 05/10/24 0240  INR 3.5* 1.1    Cardiac Enzymes: Recent Labs  Lab 05/04/24 0318 05/04/24 0938 05/05/24 0332 05/08/24 0239  CKTOTAL 411* 677* 999* 80    HbA1C: Hgb A1c MFr Bld  Date/Time Value  Ref Range Status  05/04/2024 05:18 AM 5.5 4.8 - 5.6 % Final  Comment:    (NOTE) Diagnosis of Diabetes The following HbA1c ranges recommended by the American Diabetes Association (ADA) may be used as an aid in the diagnosis of diabetes mellitus.  Hemoglobin             Suggested A1C NGSP%              Diagnosis  <5.7                   Non Diabetic  5.7-6.4                Pre-Diabetic  >6.4                   Diabetic  <7.0                   Glycemic control for                       adults with diabetes.    12/31/2020 11:28 AM 5.5 4.8 - 5.6 % Final    Comment:             Prediabetes: 5.7 - 6.4          Diabetes: >6.4          Glycemic control for adults with diabetes: <7.0     CBG: Recent Labs  Lab 05/09/24 1619 05/09/24 1928 05/10/24 0016 05/10/24 0350 05/10/24 0731  GLUCAP 149* 121* 119* 127* 147*    Review of Systems:   Unable to assess as patient is intubated and sedated.  Past Medical History:  He,  has a past medical history of Alcohol abuse, Anxiety, CA - skin cancer, CAD, residual CFX LAD disease (06/01/2011), Depression, Headache(784.0), Hyperlipemia, Hypertension, Insomnia, Mental disorder, MI (myocardial infarction) (HCC), Polysubstance abuse (HCC), and PTSD (post-traumatic stress disorder).   Surgical History:   Past Surgical History:  Procedure Laterality Date   CARDIAC CATHETERIZATION Bilateral 04/09/2015   Procedure: Coronary Angiogram;  Surgeon: Deatrice DELENA Cage, MD;  Location: [facility] INVASIVE CV LAB;  Service: Cardiovascular;  Laterality: Bilateral;   CARDIAC CATHETERIZATION [name] 04/09/2015   Procedure: Coronary Stent Intervention;  Surgeon: Deatrice DELENA Cage, MD;  Location: [facility] INVASIVE CV LAB;  Service: Cardiovascular;  Laterality: [name];   COLONOSCOPY WITH PROPOFOL  [name] 03/19/2019   Procedure: COLONOSCOPY WITH PROPOFOL ;  Surgeon: Unk Corinn Skiff, MD;  Location: [facility] ENDOSCOPY;  Service: Gastroenterology;  Laterality: [name];   LEFT HEART CATH  Right 05/29/2011   Procedure: LEFT HEART CATH;  Surgeon: Alm LELON Clay, MD;  Location: [facility] CATH LAB;  Service: Cardiovascular;  Laterality: Right;   LEFT HEART CATHETERIZATION WITH CORONARY ANGIOGRAM [name] 06/01/2011   Procedure: LEFT HEART CATHETERIZATION WITH CORONARY ANGIOGRAM;  Surgeon: Alm LELON Clay, MD;  Location: [facility] CATH LAB;  Service: Cardiovascular;  Laterality: [name];  relook cath, possible PCI   TONSILLECTOMY AND ADENOIDECTOMY       Social History:   reports that he has been smoking cigarettes. He has a 45 pack-year smoking history. He has never used smokeless tobacco. He reports that he does not drink alcohol and does not use drugs.   Family History:  His family history includes Anxiety disorder in his father and mother; Cancer in his mother; Chronic Renal Failure in his mother; Coronary artery disease (age of onset: 74) in his maternal grandfather; Depression in his father and mother; Other in his brother.   Allergies Allergies[1]   Home Medications  Prior to Admission medications  Medication Sig Start Date End Date Taking? Authorizing Provider  aspirin  81 MG EC tablet TAKE ONE TABLET BY MOUTH EVERY DAY 01/09/16   McGowan, Clotilda A, PA-C  clonazePAM  (KLONOPIN ) 1 MG tablet Take 1 tablet (1 mg total) by mouth 3 (three) times daily. 04/23/24     clopidogrel  (PLAVIX ) 75 MG tablet Take 1 tablet (75 mg total) by mouth daily. 05/23/23     doxepin  (SINEQUAN ) 25 MG capsule Take 1-2 capsules (25-50 mg total) by mouth at bedtime. 03/26/24     gabapentin  (NEURONTIN ) 300 MG capsule Take 1 capsule (300 mg total) by mouth 3 (three) times daily. 02/14/24     gabapentin  (NEURONTIN ) 300 MG capsule Take 2 capsules (600 mg total) by mouth 3 (three) times daily. 04/05/24     hydrOXYzine  (VISTARIL ) 25 MG capsule Take 1 capsule (25 mg total) by mouth 2 (two) times daily. 03/12/24     hydrOXYzine  (VISTARIL ) 25 MG capsule Take 1 capsule (25 mg total) by mouth 2 (two) times daily. 04/23/24     lidocaine   (LIDODERM ) 5 % Place 1 patch onto the skin daily Apply patch to the most painful area for up to 12 hours in a 24 hour period. Patient taking differently: Place 1 patch onto the skin as directed. PRN 01/17/24     losartan  (COZAAR ) 100 MG tablet Take 1 tablet (100 mg total) by mouth daily. 10/17/23     losartan  (COZAAR ) 100 MG tablet Take 1 tablet (100 mg total) by mouth daily. 03/12/24     metFORMIN  (GLUCOPHAGE ) 500 MG tablet Take 1 tablet (500 mg total) by mouth daily with breakfast. 01/11/24     metoprolol  tartrate (LOPRESSOR ) 25 MG tablet Take 1 tablet (25 mg total) by mouth 2 (two) times daily. 05/23/23     QUEtiapine  (SEROQUEL ) 200 MG tablet Take 1 tablet (200 mg total) by mouth at bedtime as needed for insomnia. 01/18/24     QUEtiapine  (SEROQUEL ) 400 MG tablet Take 1 tablet (400 mg total) by mouth at bedtime. 02/01/24     QUEtiapine  (SEROQUEL ) 400 MG tablet Take 1 tablet (400 mg total) by mouth at bedtime 03/01/24     rosuvastatin  (CRESTOR ) 20 MG tablet Take 1 tablet (20 mg total) by mouth daily. 01/16/24     tirzepatide  (MOUNJARO ) 10 MG/0.5ML Pen Inject 10 mg into the skin once a week. 07/07/23     tirzepatide  (MOUNJARO ) 10 MG/0.5ML Pen Inject 10 mg into the skin once a week. 08/01/23     tirzepatide  (MOUNJARO ) 12.5 MG/0.5ML Pen Inject 12.5 mg into the skin once a week. 10/24/23     traMADol  (ULTRAM ) 50 MG tablet Take 1 tablet (50 mg total) by mouth 2 (two) times daily as needed. 12/14/23     traZODone  (DESYREL ) 150 MG tablet Take 1 tablet (150 mg total) by mouth at bedtime. 01/11/24     traZODone  (DESYREL ) 150 MG tablet Take 1 tablet (150 mg total) by mouth at bedtime. 03/12/24     umeclidinium-vilanterol (ANORO ELLIPTA ) 62.5-25 MCG/ACT AEPB Inhale 1 puff into the lungs daily. 02/08/24   Isadora Hose, MD  ARIPiprazole  (ABILIFY ) 2 MG tablet Take 1 tablet (2 mg total) by mouth once daily for 120 days 09/01/23 12/08/23       Critical care provider statement:   Total critical care time: 33 minutes    Performed by: Parris MD   Critical care time was exclusive of separately billable procedures and treating other patients.   Critical care was necessary to treat or  prevent imminent or life-threatening deterioration.   Critical care was time spent personally by me on the following activities: development of treatment plan with patient and/or surrogate as well as nursing, discussions with consultants, evaluation of patient's response to treatment, examination of patient, obtaining history from patient or surrogate, ordering and performing treatments and interventions, ordering and review of laboratory studies, ordering and review of radiographic studies, pulse oximetry and re-evaluation of patient's condition.    Alajiah Dutkiewicz, M.D.  Pulmonary & Critical Care Medicine                    [1] No Known Allergies  "

## 2024-05-11 DIAGNOSIS — T68XXXA Hypothermia, initial encounter: Secondary | ICD-10-CM | POA: Diagnosis not present

## 2024-05-11 DIAGNOSIS — I4891 Unspecified atrial fibrillation: Secondary | ICD-10-CM | POA: Diagnosis not present

## 2024-05-11 DIAGNOSIS — E119 Type 2 diabetes mellitus without complications: Secondary | ICD-10-CM | POA: Diagnosis not present

## 2024-05-11 DIAGNOSIS — Z7985 Long-term (current) use of injectable non-insulin antidiabetic drugs: Secondary | ICD-10-CM | POA: Diagnosis not present

## 2024-05-11 DIAGNOSIS — M6282 Rhabdomyolysis: Secondary | ICD-10-CM | POA: Diagnosis not present

## 2024-05-11 DIAGNOSIS — J9601 Acute respiratory failure with hypoxia: Secondary | ICD-10-CM | POA: Diagnosis not present

## 2024-05-11 DIAGNOSIS — F1910 Other psychoactive substance abuse, uncomplicated: Secondary | ICD-10-CM

## 2024-05-11 DIAGNOSIS — G928 Other toxic encephalopathy: Secondary | ICD-10-CM

## 2024-05-11 DIAGNOSIS — J449 Chronic obstructive pulmonary disease, unspecified: Secondary | ICD-10-CM

## 2024-05-11 DIAGNOSIS — E872 Acidosis, unspecified: Secondary | ICD-10-CM

## 2024-05-11 DIAGNOSIS — N179 Acute kidney failure, unspecified: Secondary | ICD-10-CM

## 2024-05-11 DIAGNOSIS — Z7984 Long term (current) use of oral hypoglycemic drugs: Secondary | ICD-10-CM | POA: Diagnosis not present

## 2024-05-11 LAB — BASIC METABOLIC PANEL WITH GFR
Anion gap: 12 (ref 5–15)
BUN: 25 mg/dL — ABNORMAL HIGH (ref 8–23)
CO2: 29 mmol/L (ref 22–32)
Calcium: 8.8 mg/dL — ABNORMAL LOW (ref 8.9–10.3)
Chloride: 106 mmol/L (ref 98–111)
Creatinine, Ser: 0.79 mg/dL (ref 0.61–1.24)
GFR, Estimated: >60 mL/min
Glucose, Bld: 136 mg/dL — ABNORMAL HIGH (ref 70–99)
Potassium: 3.6 mmol/L (ref 3.5–5.1)
Sodium: 148 mmol/L — ABNORMAL HIGH (ref 135–145)

## 2024-05-11 LAB — PHOSPHORUS: Phosphorus: 3.8 mg/dL (ref 2.5–4.6)

## 2024-05-11 LAB — GLUCOSE, CAPILLARY
Glucose-Capillary: 112 mg/dL — ABNORMAL HIGH (ref 70–99)
Glucose-Capillary: 134 mg/dL — ABNORMAL HIGH (ref 70–99)
Glucose-Capillary: 136 mg/dL — ABNORMAL HIGH (ref 70–99)
Glucose-Capillary: 138 mg/dL — ABNORMAL HIGH (ref 70–99)
Glucose-Capillary: 143 mg/dL — ABNORMAL HIGH (ref 70–99)
Glucose-Capillary: 150 mg/dL — ABNORMAL HIGH (ref 70–99)

## 2024-05-11 LAB — CBC
HCT: 39.3 % (ref 39.0–52.0)
Hemoglobin: 12.7 g/dL — ABNORMAL LOW (ref 13.0–17.0)
MCH: 32.4 pg (ref 26.0–34.0)
MCHC: 32.3 g/dL (ref 30.0–36.0)
MCV: 100.3 fL — ABNORMAL HIGH (ref 80.0–100.0)
Platelets: 157 K/uL (ref 150–400)
RBC: 3.92 MIL/uL — ABNORMAL LOW (ref 4.22–5.81)
RDW: 17.2 % — ABNORMAL HIGH (ref 11.5–15.5)
WBC: 6.2 K/uL (ref 4.0–10.5)
nRBC: 0 % (ref 0.0–0.2)

## 2024-05-11 LAB — MAGNESIUM: Magnesium: 2.4 mg/dL (ref 1.7–2.4)

## 2024-05-11 LAB — TRIGLYCERIDES: Triglycerides: 298 mg/dL — ABNORMAL HIGH

## 2024-05-11 MED ORDER — POLYETHYLENE GLYCOL 3350 17 G PO PACK
17.0000 g | PACK | Freq: Two times a day (BID) | ORAL | Status: DC
  Administered 2024-05-11 – 2024-05-12 (×2): 17 g
  Filled 2024-05-11 (×2): qty 1

## 2024-05-11 MED ORDER — DEXMEDETOMIDINE HCL IN NACL 400 MCG/100ML IV SOLN
INTRAVENOUS | Status: AC
  Filled 2024-05-11: qty 100

## 2024-05-11 MED ORDER — DEXMEDETOMIDINE HCL IN NACL 400 MCG/100ML IV SOLN: 0.0000 ug/kg/h | INTRAVENOUS | Status: DC

## 2024-05-11 MED ORDER — POTASSIUM CHLORIDE 20 MEQ PO PACK
20.0000 meq | PACK | Freq: Once | ORAL | Status: AC
  Administered 2024-05-11: 20 meq
  Filled 2024-05-11: qty 1

## 2024-05-11 MED ORDER — FREE WATER
200.0000 mL | Status: DC
  Administered 2024-05-11 – 2024-05-12 (×7): 200 mL

## 2024-05-11 NOTE — Consult Note (Addendum)
 Pharmacy Consult Note - Electrolytes  Sodium (mmol/L)  Date Value  05/11/2024 148 (H)  02/11/2022 136  03/07/2014 143   Potassium (mmol/L)  Date Value  05/11/2024 3.6  03/07/2014 3.2 (L)   Magnesium  (mg/dL)  Date Value  98/76/7973 2.4  03/04/2014 2.3   Calcium  (mg/dL)  Date Value  98/76/7973 8.8 (L)   Calcium , Total (mg/dL)  Date Value  88/80/7984 7.4 (L)   Albumin (g/dL)  Date Value  98/77/7973 2.9 (L)  10/22/2020 4.2  03/07/2014 3.4   Phosphorus (mg/dL)  Date Value  98/76/7973 3.8    ASSESSMENT: 64 y.o. male with PMH including CAD/STEMI s/p DES (2012), alcohol  abuse presents with hypothermia after being found unresponsive outside in cold. Pharmacy has been consulted to monitor and replace electrolytes. Patient's renal function is currently poor because he is in AKI > AKI resolved. Patient with new Afib w/ RVR on EKG 1/19  Lab Results  Component Value Date   CREATININE 0.79 05/11/2024   CREATININE 0.78 05/10/2024   CREATININE 0.92 05/09/2024    nutrition: tube feeds 59mL/hr + feeding supplement TID + free water  200mL Q4h  Pertinent medications: n/a  Goal of Therapy:  Potassium 4.0 - 5.1 mmol/L Magnesium  2.0 - 2.4 mg/dL All Other Electrolytes WNL   PLAN: --adjusted free water  flushes to 200 mL every 4 hours ISO hypernatremia --20 mEq Kcl per tube x 1 --F/u with AM labs.  Thank you for allowing pharmacy to be a part of this patients care.  Adriana Bolster, PharmD, BCPS 05/11/2024 8:06 AM

## 2024-05-11 NOTE — Consult Note (Addendum)
 "                                                                                   Consultation Note Date: 05/11/2024   Patient Name: Larry French  DOB: 10-18-1960  MRN: [id number]  Age / Sex: 64 y.o., male  PCP: [name], MD Referring Physician: Isadora Hose, MD  Subjective: Notes, labs, and diagnostics reviewed.  In to see patient.  He is currently resting in bed with his father and 2 cousins at bedside.  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 They discuss his history of ETOH and medication use. Jenna discusses ongoing ETOH use. 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    Dr. Parris entered conversation with father and uncle, and I stepped out with Jenna.  Jenna discusses her questions of patient's presentation prior to coming to the hospital.  She discusses that there was blood on the ground where he was laying as well as some blood in the house.  She discusses concern for the amount of time that he laid on the ground before 911 was called by her husband.  Spoke with CCM; Jenna to nurses station to recap the above and discuss further with CCM and myself. She discusses history of drug use, and previous rehab attempts. Jenna went with family to cafeteria.   TOC was called, and came to unit to talk with team, and case was discussed between CCM, PMT and TOC in great depth.  TOC will continue to work with DSS.   CCM Lonell NP and myself, and nurse returned to bedside to speak with family.  In-depth discussion with patient status recapped. Aggressive care vs comfort care, scenarios and options discussed. Father is clear that they do not want the patient to suffer.  He is clear that the patient would not want tracheostomy. He states he would not want CPR. We discussed patient's labs and diagnostics.  With in-depth discussion, family would like to provide more time to medically optimize prior to one-way extubation.  Jenna returned to nurse's station to discuss and ask clarifying  questions regarding plans moving forward to PMT and CCM.     Length of Stay: 6   Current Medications: Scheduled Meds:   B-complex with vitamin C  1 tablet Per Tube Daily   Chlorhexidine  Gluconate Cloth  6 each Topical Daily   clonazePAM   1 mg Per Tube TID   enoxaparin  (LOVENOX ) injection  45 mg Subcutaneous Q24H   famotidine   20 mg Per Tube BID   feeding supplement (PROSource TF20)  60 mL Per Tube TID   feeding supplement (VITAL HIGH PROTEIN)  1,000 mL Per Tube Q24H   folic acid   1 mg Oral Daily   free water   30 mL Per Tube Q4H   furosemide   40 mg Intravenous Once   insulin  aspart  0-15 Units Subcutaneous Q4H   multivitamin with minerals  1 tablet Oral Daily   nutrition supplement (JUVEN)  1 packet Per Tube BID BM   mouth rinse  15 mL Mouth Rinse Q2H   polyethylene glycol  17 g Per Tube Daily   senna  1 tablet Per Tube  BID   thiamine   100 mg Oral Daily    Or   thiamine   100 mg Intravenous Daily          Continuous Infusions:  fentaNYL  infusion INTRAVENOUS 125 mcg/hr (05/10/24 1200)   norepinephrine  (LEVOPHED ) Adult infusion 3 mcg/min (05/06/24 1839)   piperacillin -tazobactam (ZOSYN )  IV 3.375 g (05/10/24 1310)   propofol  (DIPRIVAN ) infusion 40 mcg/kg/min (05/10/24 1308)   valproate sodium  500 mg (05/10/24 1354)   vasopressin  Stopped (05/04/24 0747)          PRN Meds: acetaminophen , fentaNYL , hydrALAZINE , ipratropium-albuterol , metoprolol  tartrate, mouth rinse, polyethylene glycol       Physical Exam Constitutional:      Comments: Eyes closed.  Sedated  Pulmonary:     Comments: On ventilator             Vital Signs: BP 114/65   Pulse (!) 53   Temp 98.7 F (37.1 C) (Oral)   Resp (!) 22   Ht 5' 8 (1.727 m)   Wt 99 kg   SpO2 99%   BMI 33.19 kg/m  SpO2: SpO2: 99 % O2 Device: O2 Device: Ventilator O2 Flow Rate: O2 Flow Rate (L/min): 5 L/min   Intake/output summary:  Intake/Output Summary (Last 24 hours) at 05/10/2024 1444 Last data filed at 05/10/2024  1200    Gross per 24 hour  Intake 2146.95 ml  Output 1000 ml  Net 1146.95 ml    LBM: Last BM Date : 05/08/24 Baseline Weight: Weight: 101.2 kg Most recent weight: Weight: 99 kg       Patient Active Problem List    Diagnosis Date Noted   Altered mental status 05/06/2024   Pressure injury of skin 05/06/2024   Acute respiratory failure (HCC) 05/04/2024   Cerumen impaction 04/29/2021   Controlled type 2 diabetes mellitus without complication, without long-term current use of insulin  (HCC) 01/28/2021   Fall 01/10/2020   Insomnia 12/05/2019   Anxiety 08/14/2019   Numbness and tingling 08/14/2019   Fleshy skin mole 02/28/2019   Elevated liver enzymes 10/17/2018   Hearing loss 10/17/2018   Erectile dysfunction 10/17/2018   Type 2 diabetes mellitus with hyperglycemia, without long-term current use of insulin  (HCC) 03/22/2017   Hypertension 11/04/2016   Healthcare maintenance 11/04/2016   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) 06/19/2015   Benzodiazepine dependence (HCC) 04/25/2015   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) 04/22/2015   GAD (generalized anxiety disorder)     Severe episode of recurrent major depressive disorder, without psychotic features (HCC)     Alcohol  abuse 04/17/2015   Benzodiazepine abuse (HCC) 04/17/2015   Posttraumatic stress disorder 04/17/2015   Suicidal ideation 04/17/2015   Abnormal EKG     Benzodiazepine withdrawal (HCC)     NSTEMI (non-ST elevated myocardial infarction) (HCC)     Positive cardiac stress test     Elevated troponin     Withdrawal from benzodiazepine (HCC) 04/05/2015   STEMI, RCA DES 05/28/10 06/01/2011   CAD, residual CFX LAD disease 06/01/2011   Tobacco abuse 05/29/2011   History of ETOH abuse and Xanax  abuse. In rehab pta 05/29/2011      Palliative Care Assessment & Plan      Recommendations/Plan: Father would like more time for outcomes.  Day 6 of intubation. No trach, no CPR.  Plans in  place to medically optimize and extubate when medically appropriate. If patient fails extubation, plans to shift to comfort care.     Code Status:  Code Status Orders  (From admission, onward)                 Start     Ordered    05/10/24 1438   Do not attempt resuscitation (DNR) Pre-Arrest Interventions Desired  (Code Status)  Continuous       Question Answer Comment  If pulseless and not breathing No CPR or chest compressions.    In Pre-Arrest Conditions (Patient Has Pulse and Is Breathing) May intubate, use advanced airway interventions and cardioversion/ACLS medications if appropriate or indicated. May transfer to ICU.    Consent: Discussion documented in EHR or advanced directives reviewed       05/10/24 1437                Code Status History       Date Active Date Inactive Code Status Order ID Comments User Context    05/04/2024 0414 05/10/2024 1437 Full Code [id number]   Terrace Robet RIGGERS ED    06/19/2015 0110 06/26/2015 1939 Full Code [id number]   Ray Jacques FORBES RIGGERS Inpatient    04/22/2015 2156 04/29/2015 1854 Full Code [id number]   Ray Jacques FORBES RIGGERS Inpatient    04/09/2015 1219 04/10/2015 1425 Full Code [id number]   Darron Deatrice LABOR, MD Inpatient    04/05/2015 2048 04/09/2015 1219 Full Code [id number]   Tobie Press, MD Inpatient           Care plan was discussed with CCM and TOC   Thank you for allowing the Palliative Medicine Team to assist in the care of this patient.     Time In: 1:00 Time Out: 2:40 Total Time 1 hours 40 min Prolonged Time Billed  yes          Greater than 50%  of this time was spent counseling and coordinating care related to the above assessment and plan.   Camelia Lewis, NP   Please contact Palliative Medicine Team phone at [phone] for questions and concerns.                   "

## 2024-05-11 NOTE — Progress Notes (Signed)
 "  NAME:  Larry French, MRN:  [id number], DOB:  1960/08/24, LOS: 7 ADMISSION DATE:  05/04/2024, CHIEF COMPLAINT:  Altered Mental Status   History of Present Illness:   Larry French is a 64 y.o. male with history of COPD, alcohol  and benzodiazepine abuse, hypertension, hyperlipidemia and CAD who presented to the [facility] ED after he was found down outside of his father's house with altered mental status.     History gathered per chart review EMS reports they had an axillary temperature of 88, and were unable to get an oxygen saturation or blood pressure at the time of arrival on scene.  Blood glucose was in the low 100s.  It was reported that the patient was awake and breathing on his own but unable to answer questions or following commands.  Intermittently agitated. Per EMS, father told him that if he wanted to come inside he would have to crawl inside. On arrival to the home, it was reported he was found outside naked with various abrasions scattered throughout his body after attempting to crawl inside.  It is unknown how long he was outside. The patient did not make it inside. His father found him and called 911.    The father states that the last time he saw patient was around suppertime, around 5 to 6 PM, stating he was normal at that time. He states that he woke up around 1:30 AM and noticed that his bedroom light was on and found the patient lying on the ground outside of their carport in the gravel driveway. States he was awake but could not talk much. When asked why he thought the patient was unable to talk father states that because he was cold. His father does not think that he was intoxicated but states that he does not know what medications the patient takes or if he has used any drugs or alcohol  recently. It is reported the last time he notes that the patient was actively drinking was about 2 weeks ago. He states the patient did fall about 2 days ago because he loses his  balance often. Denied any other known injury. No known recent fevers, cough, vomiting or diarrhea. He does not think that the patient had any toxic alcohols or illicit drugs but is not sure. He does report that the patient takes Anastacia powders regularly. Denies that the patient has had any recent suicidal thoughts and does not think that this was a suicide attempt.    ED course: Upon arrival to the ED, the patient was profoundly hypotensive requiring emergent central line placement for vasopressor support given difficulty with access. Although he was lethargic but awake on arrival, he did become more lethargic and agitated requiring sedation and ultimately was emergently intubated at bedside by the EDP for airway protection. He was put on the [facility] and started on warmed IVFs with heated packs to assist with severe hypothermia. He was found to have severe AGMA and was given 3 amps of bicarb followed by a bicarb gtt. Mild lactic acidosis iso sepsis but found to be + for salicylates. Poison control contacted and directed to start fomepizole  for potential toxic ethanol ingestion. Nephrology contacted with concern for the need of CRRT and plan to see him during the day.    Vitals on Arrival: temp: 85.4 F, BP 61/45, HR 82, RR 16, SpO2 78%   Pertinent Labs/Diagnostics: Found to be with profound AG metabolic acidosis and lactic acidosis iso of acute renal failure, rhabdomyolysis  and with a supratherapeutic INR.  CXR Impression :  1. Patchy airspace consolidation in the base of both lungs, consistent with pneumonia or aspiration. Mid and upper lungs remain clear. 2. Endotracheal tube and nasogastric tube are in satisfactory position.   CT Head Impression:  1. No acute intracranial abnormality. 2. Chronic microvascular ischemic disease in bilateral cerebral hemispheres deep and periventricular white matter. 3. Chronic left thalamic lacunar infarctions. 4. Atherosclerotic calcifications within the  cavernous internal carotid and vertebral arteries.   CT C Spine Impression: 1. No evidence of fracture or acute traumatic injury. 2. Mild diffuse degenerative disc disease throughout the cervical spine.   CT Chest/Abd/Pelvis Impression: 1. Endotracheal tube tip in the proximal right mainstem bronchus; recommend retraction/repositioning. 2. Streaky dependent lower lobe atelectasis/consolidation bilaterally with scattered patchy reticular opacities in the right upper lobe. 3. Healing/healed fractures of the right anterior/lateral 4th through 9th ribs; no evidence of acute traumatic injury. 4. Cardiomegaly with moderate calcific coronary artery disease. 5. Hepatic steatosis and small exophytic right renal cysts. 6. Chronic mild-to-moderate L1 compression deformity and chronic bilateral L5 spondylolysis.  Pertinent  Medical History   Polysubstance abuse - cocaine, opioids Hypertension Hyperlipidemia CAD Anxiety Insomnia Hx of MI 2013 PTSD Tobacco use  Significant Hospital Events: Including procedures, antibiotic start and stop dates in addition to other pertinent events   05/05/24- patient remains critically ill on MV.  Treating for pneumonia and antifreeze overdose. Ventilator management -weaned FiO2 on PRVC from 100 to 40%. SBT today. Unable to wean due to heavy mucus per ETT clogging circuit.  05/06/24- patient was performing SBT with sedation off he self extubated.  After appx 1 hour he was in severe resp distress and required re-intubation. Poison control is continuing to manage ethylene glycol poisoning and recommends continuing fomepizole .  05/07/24-patient on MV. Secretions are heavy,  BAL results no growth. He is still being treated for antifreeze poisoning. 05/08/24- propofol  and fentanyl  are off and precedex  still maximal dose infusing for awakening trial but patient is already agitated 05/09/24- Failed sbt. I met with family today to review plan. They were present during SBT  and saw patient struggling severely.  We briefly discussed potential trache in future.  05/10/24- patient had failed SBT yesterday and previously failed self extubation.  Family wants to meet with palliative and shares patients wishes are DNR/DNI and he requested palliative extubation.  We will have palliative team meet with family today.   Interim History / Subjective:  Patient remains intubated and sedated, father updated at bedside. Failed wake up assessment.  Objective    Blood pressure 128/71, pulse 70, temperature 100 F (37.8 C), temperature source Axillary, resp. rate (!) 22, height 5' 8 (1.727 m), weight 96.1 kg, SpO2 97%.    Vent Mode: PRVC FiO2 (%):  [28 %] 28 % Set Rate:  [22 bmp] 22 bmp Vt Set:  [500 mL] 500 mL PEEP:  [5 cmH20] 5 cmH20 Plateau Pressure:  [15 cmH20-16 cmH20] 16 cmH20   Intake/Output Summary (Last 24 hours) at 05/11/2024 0814 Last data filed at 05/11/2024 0700 Gross per 24 hour  Intake 1834.41 ml  Output 2750 ml  Net -915.59 ml   Filed Weights   05/09/24 0408 05/10/24 0500 05/11/24 0500  Weight: 97 kg 99 kg 96.1 kg    Examination: Physical Exam Cardiovascular:     Rate and Rhythm: Normal rate and regular rhythm.     Pulses: Normal pulses.     Heart sounds: Normal heart sounds.  Pulmonary:  Effort: Pulmonary effort is normal.     Breath sounds: Normal breath sounds. No wheezing.  Neurological:     Mental Status: He is alert. He is disoriented.      Assessment and Plan   #Toxic Metabolic Encephalopathy  #Polysubstance Abuse #Hypothermia #Acute Hypoxic Respiratory Failure #Circulatory Shock  #Suspected Sepsis #AKI  #Rhabdomyolysis  #HAGMA #A. Fib #T2DM #COPD  Neurology - fentanyl  and propofol  for analgosedation. Patient with significant history of EtOH use disorder, and there's potential for severe EtOH withdrawal given presentation and likelihood of active drinking. Goal RASS -1.  Cardiovascular - circulatory shock secondary to  sedation, resolved and now off vasopressors. Goal MAP > 65 mmHg  Pulmonary - intubated for encephalopathy in the setting of severe hypothermia. Underwent bronchoscopy on 1/17 with BAL in RML given secretions. He self extubated on 1/18 and was re-intubated after recurrent respiratory failure.  Currently on broad spectrum antibiotics with plan for daily SBT and WUA.  No PEG/Trach per father who expressed wishes for DNR/DNI. We will continue with aggressive medical interventions and work towards weaning him off the ventilator. Mental status is current barrier to extubation.  Gastrointestinal - on tube feeds. LFT's elevated on presentation, likely secondary to active EtOH use, and have trended down.  Renal - AKI on presentation with findings of rhabdo given hypothermia and being found down. He developed ATN and subsequent improvement in kidney function. Found to be be salicylate positive on presentation, and received fomepizole  for concern for ethylene glycol toxicity, though toxic alcohol  levels sent and was negative, now stopped. Kidney function today is back to baseline, with some hypernatremia. Holding diuresis  Endocrine - ICU glycemic protocol  Hem/Onc - enoxaparin  for DVT prophylaxis  ID - on zosyn  for aspiration pneumonia and has received a prolonged course. Respiratory cultures with no bacterial growth to date, but do show yeast. Will await speciation and continue antibiotics for the time being.  MSK - CK's improved, hypothermia resolved  Other - family updated at bedside (including father). Family report patient is actively drinking and driving. I had previously seen the patient for an interlock device exemption evaluation, and signed paperwork given his inability to generate flow. Given his active drinking and driving, I have reached out to the [facility] and faxed paperwork requesting re-examination for the patient as I am concerned his driving might represent a public risk. As to goals of  care, we decided to proceed with current treatment plan, and re-evaluate for extubation over the next 48 hours.   Labs   CBC: Recent Labs  Lab 05/06/24 0334 05/07/24 0423 05/08/24 0239 05/09/24 0351 05/10/24 0240 05/11/24 0435  WBC 3.9* 6.7 5.7 6.0 7.1 6.2  NEUTROABS 1.5* 3.6  --   --   --   --   HGB 12.1* 10.8* 12.0* 12.2* 12.4* 12.7*  HCT 36.5* 31.8* 36.4* 37.4* 38.6* 39.3  MCV 96.1 94.1 97.1 98.2 100.0 100.3*  PLT 74* 89* 92* 126* 135* 157    Basic Metabolic Panel: Recent Labs  Lab 05/07/24 0423 05/08/24 0239 05/09/24 0343 05/09/24 0351 05/10/24 0240 05/11/24 0435  NA 137 139  --  141 144 148*  K 5.4* 3.5  --  3.9 4.0 3.6  CL 104 103  --  105 108 106  CO2 22 25  --  27 27 29   GLUCOSE 101* 124*  --  119* 131* 136*  BUN 10 13  --  20 28* 25*  CREATININE 0.90 0.81  --  0.92 0.78 0.79  CALCIUM  8.1* 8.4*  --  9.0 9.3 8.8*  MG 2.0 2.1 2.3  --  2.6* 2.4  PHOS 2.7 3.0  --  3.5 3.6 3.8   GFR: Estimated Creatinine Clearance: 107.7 mL/min (by C-G formula based on SCr of 0.79 mg/dL). Recent Labs  Lab 05/05/24 0639 05/06/24 0334 05/08/24 0239 05/09/24 0351 05/10/24 0240 05/11/24 0435  WBC  --    < > 5.7 6.0 7.1 6.2  LATICACIDVEN 1.8  --   --   --   --   --    < > = values in this interval not displayed.    Liver Function Tests: Recent Labs  Lab 05/05/24 0332 05/06/24 0334 05/07/24 0423 05/08/24 0239 05/10/24 0240  AST 1,420*  --   --  123* 61*  ALT 1,398*  --   --  398* 201*  ALKPHOS 102  --   --  128* 116  BILITOT 1.2  --   --  1.7* 0.9  PROT 4.8*  --   --  5.5* 6.1*  ALBUMIN 2.5* 2.5* 2.8* 2.8* 2.9*   No results for input(s): LIPASE, AMYLASE in the last 168 hours. No results for input(s): AMMONIA in the last 168 hours.  ABG    Component Value Date/Time   PHART 7.48 (H) 05/10/2024 1500   PCO2ART 43 05/10/2024 1500   PO2ART 72 (L) 05/10/2024 1500   HCO3 32.0 (H) 05/10/2024 1500   ACIDBASEDEF 3.6 (H) 05/06/2024 1300   O2SAT 97.8 05/10/2024  1500     Coagulation Profile: Recent Labs  Lab 05/10/24 0240  INR 1.1    Cardiac Enzymes: Recent Labs  Lab 05/04/24 0938 05/05/24 0332 05/08/24 0239  CKTOTAL 677* 999* 80    HbA1C: Hgb A1c MFr Bld  Date/Time Value Ref Range Status  05/04/2024 05:18 AM 5.5 4.8 - 5.6 % Final    Comment:    (NOTE) Diagnosis of Diabetes The following HbA1c ranges recommended by the American Diabetes Association (ADA) may be used as an aid in the diagnosis of diabetes mellitus.  Hemoglobin             Suggested A1C NGSP%              Diagnosis  <5.7                   Non Diabetic  5.7-6.4                Pre-Diabetic  >6.4                   Diabetic  <7.0                   Glycemic control for                       adults with diabetes.    12/31/2020 11:28 AM 5.5 4.8 - 5.6 % Final    Comment:             Prediabetes: 5.7 - 6.4          Diabetes: >6.4          Glycemic control for adults with diabetes: <7.0     CBG: Recent Labs  Lab 05/10/24 1551 05/10/24 1930 05/10/24 2317 05/11/24 0312 05/11/24 0736  GLUCAP 125* 128* 121* 134* 150*    Review of Systems:   [name]  Past Medical History:  He,  has a past medical history of Alcohol  abuse,  Anxiety, CA - skin cancer, CAD, residual CFX LAD disease (06/01/2011), Depression, Headache(784.0), Hyperlipemia, Hypertension, Insomnia, Mental disorder, MI (myocardial infarction) (HCC), Polysubstance abuse (HCC), and PTSD (post-traumatic stress disorder).   Surgical History:   Past Surgical History:  Procedure Laterality Date   CARDIAC CATHETERIZATION Bilateral 04/09/2015   Procedure: Coronary Angiogram;  Surgeon: Deatrice DELENA Cage, MD;  Location: [facility] INVASIVE CV LAB;  Service: Cardiovascular;  Laterality: Bilateral;   CARDIAC CATHETERIZATION [name] 04/09/2015   Procedure: Coronary Stent Intervention;  Surgeon: Deatrice DELENA Cage, MD;  Location: [facility] INVASIVE CV LAB;  Service: Cardiovascular;  Laterality: [name];   COLONOSCOPY WITH PROPOFOL   [name] 03/19/2019   Procedure: COLONOSCOPY WITH PROPOFOL ;  Surgeon: Unk Corinn Skiff, MD;  Location[facility] ENDOSCOPY;  Service: Gastroenterology;  Laterality: [name];   LEFT HEART CATH Right 05/29/2011   Procedure: LEFT HEART CATH;  Surgeon: Alm LELON Clay, MD;  Location: [facility] CATH LAB;  Service: Cardiovascular;  Laterality: Right;   LEFT HEART CATHETERIZATION WITH CORONARY ANGIOGRAM [name] 06/01/2011   Procedure: LEFT HEART CATHETERIZATION WITH CORONARY ANGIOGRAM;  Surgeon: Alm LELON Clay, MD;  Location: [facility] CATH LAB;  Service: Cardiovascular;  Laterality: [name];  relook cath, possible PCI   TONSILLECTOMY AND ADENOIDECTOMY       Social History:   reports that he has been smoking cigarettes. He has a 45 pack-year smoking history. He has never used smokeless tobacco. He reports that he does not drink alcohol  and does not use drugs.   Family History:  His family history includes Anxiety disorder in his father and mother; Cancer in his mother; Chronic Renal Failure in his mother; Coronary artery disease (age of onset: 78) in his maternal grandfather; Depression in his father and mother; Other in his brother.   Allergies Allergies[1]   Home Medications  Prior to Admission medications  Medication Sig Start Date End Date Taking? Authorizing Provider  aspirin  81 MG EC tablet TAKE ONE TABLET BY MOUTH EVERY DAY Patient taking differently: 325 mg daily. 01/09/16  Yes McGowan, Clotilda A, PA-C  clonazePAM  (KLONOPIN ) 1 MG tablet Take 1 tablet (1 mg total) by mouth 3 (three) times daily. 04/23/24  Yes   clopidogrel  (PLAVIX ) 75 MG tablet Take 1 tablet (75 mg total) by mouth daily. 05/23/23  Yes   doxepin  (SINEQUAN ) 25 MG capsule Take 1-2 capsules (25-50 mg total) by mouth at bedtime. 03/26/24  Yes   gabapentin  (NEURONTIN ) 300 MG capsule Take 1 capsule (300 mg total) by mouth 3 (three) times daily. 02/14/24  Yes   gabapentin  (NEURONTIN ) 300 MG capsule Take 2 capsules (600 mg total) by mouth 3 (three) times daily. 04/05/24   Yes   hydrOXYzine  (VISTARIL ) 25 MG capsule Take 1 capsule (25 mg total) by mouth 2 (two) times daily. 03/12/24  Yes   hydrOXYzine  (VISTARIL ) 25 MG capsule Take 1 capsule (25 mg total) by mouth 2 (two) times daily. 04/23/24  Yes   lidocaine  (LIDODERM ) 5 % Place 1 patch onto the skin daily Apply patch to the most painful area for up to 12 hours in a 24 hour period. Patient taking differently: Place 1 patch onto the skin as directed. PRN 01/17/24  Yes   losartan  (COZAAR ) 100 MG tablet Take 1 tablet (100 mg total) by mouth daily. 10/17/23  Yes   losartan  (COZAAR ) 100 MG tablet Take 1 tablet (100 mg total) by mouth daily. 03/12/24  Yes   metFORMIN  (GLUCOPHAGE ) 500 MG tablet Take 1 tablet (500 mg total) by mouth daily with breakfast. 01/11/24  Yes  metoprolol  tartrate (LOPRESSOR ) 25 MG tablet Take 1 tablet (25 mg total) by mouth 2 (two) times daily. 05/23/23  Yes   QUEtiapine  (SEROQUEL ) 400 MG tablet Take 1 tablet (400 mg total) by mouth at bedtime. 02/01/24  Yes   QUEtiapine  (SEROQUEL ) 400 MG tablet Take 1 tablet (400 mg total) by mouth at bedtime 03/01/24  Yes   rosuvastatin  (CRESTOR ) 20 MG tablet Take 1 tablet (20 mg total) by mouth daily. 01/16/24  Yes   tirzepatide  (MOUNJARO ) 12.5 MG/0.5ML Pen Inject 12.5 mg into the skin once a week. 10/24/23  Yes   traMADol  (ULTRAM ) 50 MG tablet Take 1 tablet (50 mg total) by mouth 2 (two) times daily as needed. 12/14/23  Yes   traZODone  (DESYREL ) 150 MG tablet Take 1 tablet (150 mg total) by mouth at bedtime. 01/11/24  Yes   umeclidinium-vilanterol (ANORO ELLIPTA ) 62.5-25 MCG/ACT AEPB Inhale 1 puff into the lungs daily. 02/08/24  Yes Tristina Sahagian, Belva, MD  tirzepatide  (MOUNJARO ) 10 MG/0.5ML Pen Inject 10 mg into the skin once a week. Patient not taking: Reported on 05/04/2024 08/01/23     traZODone  (DESYREL ) 150 MG tablet Take 1 tablet (150 mg total) by mouth at bedtime. Patient not taking: Reported on 05/04/2024 03/12/24     ARIPiprazole  (ABILIFY ) 2 MG tablet Take 1 tablet (2  mg total) by mouth once daily for 120 days 09/01/23 12/08/23       The patient is critically ill due to acute hypoxic respiratory failure, toxic metabolic encephalopathy.  Critical care was necessary to treat or prevent imminent or life-threatening deterioration. I personally performed high risk medication and infusion titration and management, titration, monitoring, and management of vasopressor/ionotrope infusion, and mechanical ventilation management and titration. Critical care time was spent by me on the following activities: development of a treatment plan with the patient and/or surrogate as well as nursing, discussions with consultants, evaluation of the patient's response to treatment, examination of the patient, obtaining a history from the patient or surrogate, ordering and performing treatments and interventions, ordering and review of laboratory studies, ordering and review of radiographic studies, review of telemetry data including pulse oximetry, re-evaluation of patient's condition and participation in multidisciplinary rounds.   I personally spent 45 minutes providing critical care not including any separately billable procedures.   [name], MD [facility] 05/11/2024 8:20 PM        [1] No Known Allergies  "

## 2024-05-11 NOTE — Plan of Care (Signed)
 Continuing with plan of care.

## 2024-05-11 NOTE — Plan of Care (Signed)
" °  Problem: Education: Goal: Ability to describe self-care measures that may prevent or decrease complications (Diabetes Survival Skills Education) will improve Outcome: Not Progressing   Problem: Coping: Goal: Ability to adjust to condition or change in health will improve Outcome: Not Progressing   Problem: Fluid Volume: Goal: Ability to maintain a balanced intake and output will improve Outcome: Progressing   Problem: Health Behavior/Discharge Planning: Goal: Ability to manage health-related needs will improve Outcome: Not Progressing   Problem: Metabolic: Goal: Ability to maintain appropriate glucose levels will improve Outcome: Progressing   Problem: Nutritional: Goal: Maintenance of adequate nutrition will improve Outcome: Progressing   Problem: Skin Integrity: Goal: Risk for impaired skin integrity will decrease Outcome: Progressing   Problem: Tissue Perfusion: Goal: Adequacy of tissue perfusion will improve Outcome: Progressing   "

## 2024-05-12 DIAGNOSIS — T68XXXA Hypothermia, initial encounter: Secondary | ICD-10-CM | POA: Diagnosis not present

## 2024-05-12 DIAGNOSIS — J9601 Acute respiratory failure with hypoxia: Secondary | ICD-10-CM | POA: Diagnosis not present

## 2024-05-12 DIAGNOSIS — F1910 Other psychoactive substance abuse, uncomplicated: Secondary | ICD-10-CM | POA: Diagnosis not present

## 2024-05-12 DIAGNOSIS — G928 Other toxic encephalopathy: Secondary | ICD-10-CM | POA: Diagnosis not present

## 2024-05-12 LAB — CBC
HCT: 40.4 % (ref 39.0–52.0)
Hemoglobin: 12.6 g/dL — ABNORMAL LOW (ref 13.0–17.0)
MCH: 31.8 pg (ref 26.0–34.0)
MCHC: 31.2 g/dL (ref 30.0–36.0)
MCV: 102 fL — ABNORMAL HIGH (ref 80.0–100.0)
Platelets: 156 10*3/uL (ref 150–400)
RBC: 3.96 MIL/uL — ABNORMAL LOW (ref 4.22–5.81)
RDW: 17.2 % — ABNORMAL HIGH (ref 11.5–15.5)
WBC: 5.5 10*3/uL (ref 4.0–10.5)
nRBC: 0 % (ref 0.0–0.2)

## 2024-05-12 LAB — BASIC METABOLIC PANEL WITH GFR
Anion gap: 9 (ref 5–15)
BUN: 27 mg/dL — ABNORMAL HIGH (ref 8–23)
CO2: 29 mmol/L (ref 22–32)
Calcium: 9 mg/dL (ref 8.9–10.3)
Chloride: 110 mmol/L (ref 98–111)
Creatinine, Ser: 0.66 mg/dL (ref 0.61–1.24)
GFR, Estimated: >60 mL/min
Glucose, Bld: 117 mg/dL — ABNORMAL HIGH (ref 70–99)
Potassium: 3.9 mmol/L (ref 3.5–5.1)
Sodium: 148 mmol/L — ABNORMAL HIGH (ref 135–145)

## 2024-05-12 LAB — GLUCOSE, CAPILLARY
Glucose-Capillary: 115 mg/dL — ABNORMAL HIGH (ref 70–99)
Glucose-Capillary: 156 mg/dL — ABNORMAL HIGH (ref 70–99)
Glucose-Capillary: 142 mg/dL — ABNORMAL HIGH (ref 70–99)

## 2024-05-12 LAB — MAGNESIUM: Magnesium: 2.6 mg/dL — ABNORMAL HIGH (ref 1.7–2.4)

## 2024-05-12 MED ORDER — GLYCOPYRROLATE 0.2 MG/ML IJ SOLN
0.2000 mg | INTRAMUSCULAR | Status: DC | PRN
  Administered 2024-05-12: 0.2 mg via INTRAVENOUS

## 2024-05-12 MED ORDER — SODIUM CHLORIDE 0.9 % IV SOLN: INTRAVENOUS | Status: AC

## 2024-05-12 MED ORDER — MIDAZOLAM HCL 2 MG/2ML IJ SOLN
INTRAMUSCULAR | Status: AC
  Filled 2024-05-12: qty 4

## 2024-05-12 MED ORDER — GLYCOPYRROLATE 1 MG PO TABS: 1.0000 mg | ORAL_TABLET | ORAL | Status: DC | PRN

## 2024-05-12 MED ORDER — MORPHINE SULFATE (PF) 2 MG/ML IV SOLN
2.0000 mg | INTRAVENOUS | Status: DC | PRN
  Administered 2024-05-12 (×3): 4 mg via INTRAVENOUS
  Administered 2024-05-12: 2 mg via INTRAVENOUS
  Administered 2024-05-13 (×2): 4 mg via INTRAVENOUS
  Administered 2024-05-13: 2 mg via INTRAVENOUS
  Administered 2024-05-14 (×2): 4 mg via INTRAVENOUS
  Filled 2024-05-12: qty 2
  Filled 2024-05-12: qty 1
  Filled 2024-05-12 (×3): qty 2
  Filled 2024-05-12: qty 1
  Filled 2024-05-12 (×2): qty 2

## 2024-05-12 MED ORDER — GLYCOPYRROLATE 0.2 MG/ML IJ SOLN: 0.2000 mg | INTRAMUSCULAR | Status: DC | PRN

## 2024-05-12 MED ORDER — MIDAZOLAM HCL (PF) 2 MG/2ML IJ SOLN
2.0000 mg | INTRAMUSCULAR | Status: DC | PRN
  Administered 2024-05-12 – 2024-05-15 (×3): 4 mg via INTRAVENOUS
  Filled 2024-05-12 (×2): qty 4

## 2024-05-12 MED ORDER — GLYCOPYRROLATE 0.2 MG/ML IJ SOLN
INTRAMUSCULAR | Status: AC
  Filled 2024-05-12: qty 1

## 2024-05-12 MED ORDER — POLYVINYL ALCOHOL 1.4 % OP SOLN: 1.0000 [drp] | Freq: Four times a day (QID) | OPHTHALMIC | Status: DC | PRN

## 2024-05-12 MED ORDER — MORPHINE SULFATE (PF) 4 MG/ML IV SOLN
INTRAVENOUS | Status: AC
  Filled 2024-05-12: qty 1

## 2024-05-12 NOTE — Progress Notes (Signed)
 "  NAME:  Larry French, MRN:  [id number], DOB:  April 15, 1961, LOS: 8 ADMISSION DATE:  05/04/2024, CHIEF COMPLAINT:  Altered Mental Status   History of Present Illness:   Larry French is a 64 y.o. male with history of COPD, alcohol  and benzodiazepine abuse, hypertension, hyperlipidemia and CAD who presented to the [facility] ED after he was found down outside of his father's house with altered mental status.     History gathered per chart review EMS reports they had an axillary temperature of 88, and were unable to get an oxygen saturation or blood pressure at the time of arrival on scene.  Blood glucose was in the low 100s.  It was reported that the patient was awake and breathing on his own but unable to answer questions or following commands.  Intermittently agitated. Per EMS, father told him that if he wanted to come inside he would have to crawl inside. On arrival to the home, it was reported he was found outside naked with various abrasions scattered throughout his body after attempting to crawl inside.  It is unknown how long he was outside. The patient did not make it inside. His father found him and called 911.    The father states that the last time he saw patient was around suppertime, around 5 to 6 PM, stating he was normal at that time. He states that he woke up around 1:30 AM and noticed that his bedroom light was on and found the patient lying on the ground outside of their carport in the gravel driveway. States he was awake but could not talk much. When asked why he thought the patient was unable to talk father states that because he was cold. His father does not think that he was intoxicated but states that he does not know what medications the patient takes or if he has used any drugs or alcohol  recently. It is reported the last time he notes that the patient was actively drinking was about 2 weeks ago. He states the patient did fall about 2 days ago because he loses his  balance often. Denied any other known injury. No known recent fevers, cough, vomiting or diarrhea. He does not think that the patient had any toxic alcohols or illicit drugs but is not sure. He does report that the patient takes Anastacia powders regularly. Denies that the patient has had any recent suicidal thoughts and does not think that this was a suicide attempt.    ED course: Upon arrival to the ED, the patient was profoundly hypotensive requiring emergent central line placement for vasopressor support given difficulty with access. Although he was lethargic but awake on arrival, he did become more lethargic and agitated requiring sedation and ultimately was emergently intubated at bedside by the EDP for airway protection. He was put on the [facility] and started on warmed IVFs with heated packs to assist with severe hypothermia. He was found to have severe AGMA and was given 3 amps of bicarb followed by a bicarb gtt. Mild lactic acidosis iso sepsis but found to be + for salicylates. Poison control contacted and directed to start fomepizole  for potential toxic ethanol ingestion. Nephrology contacted with concern for the need of CRRT and plan to see him during the day.    Vitals on Arrival: temp: 85.4 F, BP 61/45, HR 82, RR 16, SpO2 78%   Pertinent Labs/Diagnostics: Found to be with profound AG metabolic acidosis and lactic acidosis iso of acute renal failure, rhabdomyolysis  and with a supratherapeutic INR.  CXR Impression :  1. Patchy airspace consolidation in the base of both lungs, consistent with pneumonia or aspiration. Mid and upper lungs remain clear. 2. Endotracheal tube and nasogastric tube are in satisfactory position.   CT Head Impression:  1. No acute intracranial abnormality. 2. Chronic microvascular ischemic disease in bilateral cerebral hemispheres deep and periventricular white matter. 3. Chronic left thalamic lacunar infarctions. 4. Atherosclerotic calcifications within the  cavernous internal carotid and vertebral arteries.   CT C Spine Impression: 1. No evidence of fracture or acute traumatic injury. 2. Mild diffuse degenerative disc disease throughout the cervical spine.   CT Chest/Abd/Pelvis Impression: 1. Endotracheal tube tip in the proximal right mainstem bronchus; recommend retraction/repositioning. 2. Streaky dependent lower lobe atelectasis/consolidation bilaterally with scattered patchy reticular opacities in the right upper lobe. 3. Healing/healed fractures of the right anterior/lateral 4th through 9th ribs; no evidence of acute traumatic injury. 4. Cardiomegaly with moderate calcific coronary artery disease. 5. Hepatic steatosis and small exophytic right renal cysts. 6. Chronic mild-to-moderate L1 compression deformity and chronic bilateral L5 spondylolysis.  Pertinent  Medical History   Polysubstance abuse - cocaine, opioids Hypertension Hyperlipidemia CAD Anxiety Insomnia Hx of MI 2013 PTSD Tobacco use  Significant Hospital Events: Including procedures, antibiotic start and stop dates in addition to other pertinent events   05/05/24- patient remains critically ill on MV.  Treating for pneumonia and antifreeze overdose. Ventilator management -weaned FiO2 on PRVC from 100 to 40%. SBT today. Unable to wean due to heavy mucus per ETT clogging circuit.  05/06/24- patient was performing SBT with sedation off he self extubated.  After appx 1 hour he was in severe resp distress and required re-intubation. Poison control is continuing to manage ethylene glycol poisoning and recommends continuing fomepizole .  05/07/24-patient on MV. Secretions are heavy,  BAL results no growth. He is still being treated for antifreeze poisoning. 05/08/24- propofol  and fentanyl  are off and precedex  still maximal dose infusing for awakening trial but patient is already agitated 05/09/24- Failed sbt. I met with family today to review plan. They were present during SBT  and saw patient struggling severely.  We briefly discussed potential trache in future.  05/10/24- patient had failed SBT yesterday and previously failed self extubation.  Family wants to meet with palliative and shares patients wishes are DNR/DNI and he requested palliative extubation.  We will have palliative team meet with family today.  05/11/24 - failed WUA, became agitated. 05/12/24 - failed WUA, encephalopathic. Family requesting one way extubation  Interim History / Subjective:  Intubated and sedated. WUA this AM.  Objective    Blood pressure (!) 163/80, pulse (!) 104, temperature 99.1 F (37.3 C), temperature source Oral, resp. rate (!) 38, height 5' 8 (1.727 m), weight 96.1 kg, SpO2 (!) 85%.    Vent Mode: PSV FiO2 (%):  [28 %] 28 % PEEP:  [8 cmH20] 8 cmH20 Pressure Support:  [5 cmH20] 5 cmH20   Intake/Output Summary (Last 24 hours) at 05/12/2024 1406 Last data filed at 05/12/2024 1200 Gross per 24 hour  Intake 2581.55 ml  Output 1075 ml  Net 1506.55 ml   Filed Weights   05/10/24 0500 05/11/24 0500 05/12/24 0500  Weight: 99 kg 96.1 kg 96.1 kg    Examination: Physical Exam Cardiovascular:     Rate and Rhythm: Normal rate and regular rhythm.     Pulses: Normal pulses.     Heart sounds: Normal heart sounds.  Pulmonary:  Effort: Pulmonary effort is normal.     Breath sounds: Normal breath sounds. No wheezing.  Neurological:     Mental Status: He is alert. He is disoriented.      Assessment and Plan   #Toxic Metabolic Encephalopathy  #Polysubstance Abuse #Hypothermia #Acute Hypoxic Respiratory Failure #Circulatory Shock  #Suspected Sepsis #AKI  #Rhabdomyolysis  #HAGMA #A. Fib #T2DM #COPD  Neurology - fentanyl  and propofol  for analgosedation. Patient with significant history of EtOH use disorder, and there's potential for severe EtOH withdrawal given presentation and likelihood of active drinking. Patient has continued to be severely encephalopathic, and  remains encephalopathic with today's wake up assessment. Family requesting re-evaluation of goals of care, and want to proceed with withdrawal of care.  Cardiovascular - circulatory shock secondary to sedation, resolved and now off vasopressors. Goal MAP > 65 mmHg  Pulmonary - intubated for encephalopathy in the setting of severe hypothermia. Underwent bronchoscopy on 1/17 with BAL in RML given secretions. He self extubated on 1/18 and was re-intubated after recurrent respiratory failure.  Currently on broad spectrum antibiotics with plan for daily SBT and WUA.  No PEG/Trach per father who expressed wishes for DNR/DNI. With mental status being barrier towards extubation, he again failed WUA this AM and remains encephalopathic. Family requesting withdrawal of care today, with one way extubation and switch to comfort measures only if he fails extubation attempt.  Gastrointestinal - on tube feeds. LFT's elevated on presentation, likely secondary to active EtOH use, and have trended down.  Renal - AKI on presentation with findings of rhabdo given hypothermia and being found down. He developed ATN and subsequent improvement in kidney function. Found to be be salicylate positive on presentation, and received fomepizole  for concern for ethylene glycol toxicity, though toxic alcohol  levels sent and was negative, now stopped. Kidney function today is back to baseline, with some hypernatremia. Holding diuresis  Endocrine - ICU glycemic protocol  Hem/Onc - enoxaparin  for DVT prophylaxis  ID - on zosyn  for aspiration pneumonia and has received a prolonged course. Respiratory cultures with no bacterial growth to date, but do show yeast. Will await speciation and continue antibiotics for the time being.  MSK - CK's improved, hypothermia resolved  Other - family updated at bedside today, with father and cousin present. Discussed overall condition and dense encephalopathy in the setting of chronic alcohol  use.  Family reports that patient would not want to undergo aggressive medical interventions, and with encephalopathy they request that care be withdrawn. They would like to proceed with a one way extubation and switch to comfort measures only if his condition deteriorates.   Labs   CBC: Recent Labs  Lab 05/06/24 0334 05/07/24 0423 05/08/24 0239 05/09/24 0351 05/10/24 0240 05/11/24 0435 05/12/24 0338  WBC 3.9* 6.7 5.7 6.0 7.1 6.2 5.5  NEUTROABS 1.5* 3.6  --   --   --   --   --   HGB 12.1* 10.8* 12.0* 12.2* 12.4* 12.7* 12.6*  HCT 36.5* 31.8* 36.4* 37.4* 38.6* 39.3 40.4  MCV 96.1 94.1 97.1 98.2 100.0 100.3* 102.0*  PLT 74* 89* 92* 126* 135* 157 156    Basic Metabolic Panel: Recent Labs  Lab 05/07/24 0423 05/08/24 0239 05/09/24 0343 05/09/24 0351 05/10/24 0240 05/11/24 0435 05/12/24 0338  NA 137 139  --  141 144 148* 148*  K 5.4* 3.5  --  3.9 4.0 3.6 3.9  CL 104 103  --  105 108 106 110  CO2 22 25  --  27 27  29 29  GLUCOSE 101* 124*  --  119* 131* 136* 117*  BUN 10 13  --  20 28* 25* 27*  CREATININE 0.90 0.81  --  0.92 0.78 0.79 0.66  CALCIUM  8.1* 8.4*  --  9.0 9.3 8.8* 9.0  MG 2.0 2.1 2.3  --  2.6* 2.4 2.6*  PHOS 2.7 3.0  --  3.5 3.6 3.8  --    GFR: Estimated Creatinine Clearance: 107.7 mL/min (by C-G formula based on SCr of 0.66 mg/dL). Recent Labs  Lab 05/09/24 0351 05/10/24 0240 05/11/24 0435 05/12/24 0338  WBC 6.0 7.1 6.2 5.5    Liver Function Tests: Recent Labs  Lab 05/06/24 0334 05/07/24 0423 05/08/24 0239 05/10/24 0240  AST  --   --  123* 61*  ALT  --   --  398* 201*  ALKPHOS  --   --  128* 116  BILITOT  --   --  1.7* 0.9  PROT  --   --  5.5* 6.1*  ALBUMIN 2.5* 2.8* 2.8* 2.9*   No results for input(s): LIPASE, AMYLASE in the last 168 hours. No results for input(s): AMMONIA in the last 168 hours.  ABG    Component Value Date/Time   PHART 7.48 (H) 05/10/2024 1500   PCO2ART 43 05/10/2024 1500   PO2ART 72 (L) 05/10/2024 1500   HCO3 32.0  (H) 05/10/2024 1500   ACIDBASEDEF 3.6 (H) 05/06/2024 1300   O2SAT 97.8 05/10/2024 1500     Coagulation Profile: Recent Labs  Lab 05/10/24 0240  INR 1.1    Cardiac Enzymes: Recent Labs  Lab 05/08/24 0239  CKTOTAL 80    HbA1C: Hgb A1c MFr Bld  Date/Time Value Ref Range Status  05/04/2024 05:18 AM 5.5 4.8 - 5.6 % Final    Comment:    (NOTE) Diagnosis of Diabetes The following HbA1c ranges recommended by the American Diabetes Association (ADA) may be used as an aid in the diagnosis of diabetes mellitus.  Hemoglobin             Suggested A1C NGSP%              Diagnosis  <5.7                   Non Diabetic  5.7-6.4                Pre-Diabetic  >6.4                   Diabetic  <7.0                   Glycemic control for                       adults with diabetes.    12/31/2020 11:28 AM 5.5 4.8 - 5.6 % Final    Comment:             Prediabetes: 5.7 - 6.4          Diabetes: >6.4          Glycemic control for adults with diabetes: <7.0     CBG: Recent Labs  Lab 05/11/24 1935 05/11/24 2336 05/12/24 0325 05/12/24 0800 05/12/24 1144  GLUCAP 136* 143* 115* 156* 142*    Review of Systems:   [name]  Past Medical History:  He,  has a past medical history of Alcohol  abuse, Anxiety, CA - skin cancer, CAD, residual CFX LAD disease (06/01/2011), Depression, Headache(784.0),  Hyperlipemia, Hypertension, Insomnia, Mental disorder, MI (myocardial infarction) (HCC), Polysubstance abuse (HCC), and PTSD (post-traumatic stress disorder).   Surgical History:   Past Surgical History:  Procedure Laterality Date   CARDIAC CATHETERIZATION Bilateral 04/09/2015   Procedure: Coronary Angiogram;  Surgeon: Deatrice DELENA Cage, MD;  Location: [facility] INVASIVE CV LAB;  Service: Cardiovascular;  Laterality: Bilateral;   CARDIAC CATHETERIZATION [name] 04/09/2015   Procedure: Coronary Stent Intervention;  Surgeon: Deatrice DELENA Cage, MD;  Location: [facility] INVASIVE CV LAB;  Service: Cardiovascular;   Laterality: [name];   COLONOSCOPY WITH PROPOFOL  [name] 03/19/2019   Procedure: COLONOSCOPY WITH PROPOFOL ;  Surgeon: Unk Corinn Skiff, MD;  Location: [facility] ENDOSCOPY;  Service: Gastroenterology;  Laterality: [name];   LEFT HEART CATH Right 05/29/2011   Procedure: LEFT HEART CATH;  Surgeon: Alm LELON Clay, MD;  Location: [facility] CATH LAB;  Service: Cardiovascular;  Laterality: Right;   LEFT HEART CATHETERIZATION WITH CORONARY ANGIOGRAM [name] 06/01/2011   Procedure: LEFT HEART CATHETERIZATION WITH CORONARY ANGIOGRAM;  Surgeon: Alm LELON Clay, MD;  Location: [facility] CATH LAB;  Service: Cardiovascular;  Laterality: [name];  relook cath, possible PCI   TONSILLECTOMY AND ADENOIDECTOMY       Social History:   reports that he has been smoking cigarettes. He has a 45 pack-year smoking history. He has never used smokeless tobacco. He reports that he does not drink alcohol  and does not use drugs.   Family History:  His family history includes Anxiety disorder in his father and mother; Cancer in his mother; Chronic Renal Failure in his mother; Coronary artery disease (age of onset: 54) in his maternal grandfather; Depression in his father and mother; Other in his brother.   Allergies Allergies[1]   Home Medications  Prior to Admission medications  Medication Sig Start Date End Date Taking? Authorizing Provider  aspirin  81 MG EC tablet TAKE ONE TABLET BY MOUTH EVERY DAY Patient taking differently: 325 mg daily. 01/09/16  Yes McGowan, Clotilda A, PA-C  clonazePAM  (KLONOPIN ) 1 MG tablet Take 1 tablet (1 mg total) by mouth 3 (three) times daily. 04/23/24  Yes   clopidogrel  (PLAVIX ) 75 MG tablet Take 1 tablet (75 mg total) by mouth daily. 05/23/23  Yes   doxepin  (SINEQUAN ) 25 MG capsule Take 1-2 capsules (25-50 mg total) by mouth at bedtime. 03/26/24  Yes   gabapentin  (NEURONTIN ) 300 MG capsule Take 1 capsule (300 mg total) by mouth 3 (three) times daily. 02/14/24  Yes   gabapentin  (NEURONTIN ) 300 MG capsule Take 2 capsules (600 mg  total) by mouth 3 (three) times daily. 04/05/24  Yes   hydrOXYzine  (VISTARIL ) 25 MG capsule Take 1 capsule (25 mg total) by mouth 2 (two) times daily. 03/12/24  Yes   hydrOXYzine  (VISTARIL ) 25 MG capsule Take 1 capsule (25 mg total) by mouth 2 (two) times daily. 04/23/24  Yes   lidocaine  (LIDODERM ) 5 % Place 1 patch onto the skin daily Apply patch to the most painful area for up to 12 hours in a 24 hour period. Patient taking differently: Place 1 patch onto the skin as directed. PRN 01/17/24  Yes   losartan  (COZAAR ) 100 MG tablet Take 1 tablet (100 mg total) by mouth daily. 10/17/23  Yes   losartan  (COZAAR ) 100 MG tablet Take 1 tablet (100 mg total) by mouth daily. 03/12/24  Yes   metFORMIN  (GLUCOPHAGE ) 500 MG tablet Take 1 tablet (500 mg total) by mouth daily with breakfast. 01/11/24  Yes   metoprolol  tartrate (LOPRESSOR ) 25 MG tablet Take 1 tablet (25 mg  total) by mouth 2 (two) times daily. 05/23/23  Yes   QUEtiapine  (SEROQUEL ) 400 MG tablet Take 1 tablet (400 mg total) by mouth at bedtime. 02/01/24  Yes   QUEtiapine  (SEROQUEL ) 400 MG tablet Take 1 tablet (400 mg total) by mouth at bedtime 03/01/24  Yes   rosuvastatin  (CRESTOR ) 20 MG tablet Take 1 tablet (20 mg total) by mouth daily. 01/16/24  Yes   tirzepatide  (MOUNJARO ) 12.5 MG/0.5ML Pen Inject 12.5 mg into the skin once a week. 10/24/23  Yes   traMADol  (ULTRAM ) 50 MG tablet Take 1 tablet (50 mg total) by mouth 2 (two) times daily as needed. 12/14/23  Yes   traZODone  (DESYREL ) 150 MG tablet Take 1 tablet (150 mg total) by mouth at bedtime. 01/11/24  Yes   umeclidinium-vilanterol (ANORO ELLIPTA ) 62.5-25 MCG/ACT AEPB Inhale 1 puff into the lungs daily. 02/08/24  Yes Ragnar Waas, Belva, MD  tirzepatide  (MOUNJARO ) 10 MG/0.5ML Pen Inject 10 mg into the skin once a week. Patient not taking: Reported on 05/04/2024 08/01/23     traZODone  (DESYREL ) 150 MG tablet Take 1 tablet (150 mg total) by mouth at bedtime. Patient not taking: Reported on 05/04/2024 03/12/24      ARIPiprazole  (ABILIFY ) 2 MG tablet Take 1 tablet (2 mg total) by mouth once daily for 120 days 09/01/23 12/08/23       The patient is critically ill due to toxic metabolic encephalopathy, acute hypoxic respiratory failure.  Critical care was necessary to treat or prevent imminent or life-threatening deterioration. I personally performed high risk medication and infusion titration and management, titration, monitoring, and management of vasopressor/ionotrope infusion, mechanical ventilation management and titration, and weaning of mechanical ventilation. Critical care time was spent by me on the following activities: development of a treatment plan with the patient and/or surrogate as well as nursing, discussions with consultants, evaluation of the patient's response to treatment, examination of the patient, obtaining a history from the patient or surrogate, ordering and performing treatments and interventions, ordering and review of laboratory studies, ordering and review of radiographic studies, review of telemetry data including pulse oximetry, re-evaluation of patient's condition and participation in multidisciplinary rounds.   I personally spent 60 minutes providing critical care not including any separately billable procedures.   [name], MD [facility] 05/12/2024 2:10 PM          [1] No Known Allergies  "

## 2024-05-12 NOTE — Plan of Care (Signed)
 Continuing with plan of care.

## 2024-05-12 NOTE — Plan of Care (Signed)
" °  Problem: Coping: Goal: Ability to adjust to condition or change in health will improve Outcome: Not Progressing   Problem: Fluid Volume: Goal: Ability to maintain a balanced intake and output will improve Outcome: Progressing   Problem: Health Behavior/Discharge Planning: Goal: Ability to manage health-related needs will improve Outcome: Not Progressing   Problem: Metabolic: Goal: Ability to maintain appropriate glucose levels will improve Outcome: Progressing   Problem: Nutritional: Goal: Maintenance of adequate nutrition will improve Outcome: Progressing   Problem: Tissue Perfusion: Goal: Adequacy of tissue perfusion will improve Outcome: Progressing   "

## 2024-05-12 NOTE — IPAL (Signed)
" °  Interdisciplinary Goals of Care Family Meeting   Date carried out: 05/12/2024  Location of the meeting: Bedside  Member's involved: Physician and Family Member or next of kin  Durable Power of Attorney or acting medical decision maker: Father, Atlas Crossland.    Discussion: We discussed goals of care for Lonni LITTIE Masters. Explained that patient is uncomfortable in his current state and is struggling to breath after being extubated. They would not like for him to suffer, and do not want to pursue any further aggressive interventions. Patient transitioned to comfort measures only.  Code status:   Code Status: Do not attempt resuscitation (DNR) - Comfort care   Disposition: In-patient comfort care  Time spent for the meeting: 10 minutes    [name], MD  05/12/2024, 1:52 PM   "

## 2024-05-12 NOTE — IPAL (Signed)
" °  Interdisciplinary Goals of Care Family Meeting   Date carried out: 05/12/2024  Location of the meeting: Bedside  Member's involved: Physician, Bedside Registered Nurse, and Family Member or next of kin  Durable Power of Attorney or acting medical decision maker: Father, Larry French. Also present were his two cousins.    Discussion: We discussed goals of care for Larry French. I mean with Larry French's father and his family members. They expressed that Larry French would not have wanted to be intubated, and that he would not have wanted to be kept alive with machines and mechanical ventilation. They were clear about their wishes to not have a tracheostomy tube. Discussed that I cannot be certain that he would not regain his consciousness, and that neurological prognostication would require an MRI of the brain, as well as repeat wake up assessments to see how he fares.  Given he's been medical optimized, and hasn't regained his consciousness, his family requested that we proceed with a one way extubation, removing the breathing tube and seeing how he fares. They would not like for him to be re-intubated should his respiratory status decompensated. Plan will be for a one way extubation, with sedation discontinued. If his respiratory status decompensates or he shows signs of distress, they would like to transition his goals of care to comfort measures only.  Code status:   Code Status: Do not attempt resuscitation (DNR) PRE-ARREST INTERVENTIONS DESIRED   Disposition: Extubation, consider CMO if worsening respiratory status.  Time spent for the meeting: 20 minutes    Larry November, MD  05/12/2024, 12:01 PM   "

## 2024-05-12 NOTE — Progress Notes (Signed)
 Pt extubated, per md order,to 2L [place].

## 2024-05-12 NOTE — Progress Notes (Signed)
 Report given to receiving nurse for room 120.

## 2024-05-12 NOTE — Consult Note (Signed)
 Pharmacy Consult Note - Electrolytes  Sodium (mmol/L)  Date Value  05/12/2024 148 (H)  02/11/2022 136  03/07/2014 143   Potassium (mmol/L)  Date Value  05/12/2024 3.9  03/07/2014 3.2 (L)   Magnesium  (mg/dL)  Date Value  98/75/7973 2.6 (H)  03/04/2014 2.3   Calcium  (mg/dL)  Date Value  98/75/7973 9.0   Calcium , Total (mg/dL)  Date Value  88/80/7984 7.4 (L)   Albumin (g/dL)  Date Value  98/77/7973 2.9 (L)  10/22/2020 4.2  03/07/2014 3.4   Phosphorus (mg/dL)  Date Value  98/76/7973 3.8    ASSESSMENT: 64 y.o. male with PMH including CAD/STEMI s/p DES (2012), alcohol  abuse presents with hypothermia after being found unresponsive outside in cold. Pharmacy has been consulted to monitor and replace electrolytes. Patient's renal function is currently poor because he is in AKI > AKI resolved. Patient with new Afib w/ RVR on EKG 1/19  nutrition: tube feeds 45mL/hr + feeding supplement TID + free water  200mL Q4h  Pertinent medications: n/a  Goal of Therapy:  Potassium 4.0 - 5.1 mmol/L Magnesium  2.0 - 2.4 mg/dL All Other Electrolytes WNL   PLAN: --Na 148, stable with free water  adjustment yesterday --Follow-up electrolytes with AM labs tomorrow  Thank you for allowing pharmacy to be a part of this patients care.  Marolyn KATHEE Mare 05/12/2024 7:05 AM

## 2024-05-12 NOTE — Progress Notes (Signed)
 Earlier in the shift provider and this nurse at patient bedside with patient's family present.  Extensive goals of care discussion occurred.  Family expressed desire for one-way extubation and if patient declines afterwards to transition to comfort care.  Patient was extubated at 1239 by respiratory therapy, patient's respiratory rate soon escalated to the 40s with desaturations to the mid to low 80s, provider to bedside, and patient transitioned to comfort care.

## 2024-05-12 NOTE — Progress Notes (Signed)
 Patient transferred to room 121 with all belongings, family present at bedside on arrival.

## 2024-05-13 DIAGNOSIS — R4182 Altered mental status, unspecified: Secondary | ICD-10-CM | POA: Diagnosis not present

## 2024-05-13 DIAGNOSIS — K7200 Acute and subacute hepatic failure without coma: Secondary | ICD-10-CM | POA: Diagnosis not present

## 2024-05-13 DIAGNOSIS — Z7189 Other specified counseling: Secondary | ICD-10-CM | POA: Diagnosis not present

## 2024-05-13 DIAGNOSIS — J9600 Acute respiratory failure, unspecified whether with hypoxia or hypercapnia: Secondary | ICD-10-CM | POA: Diagnosis not present

## 2024-05-13 DIAGNOSIS — Z515 Encounter for palliative care: Secondary | ICD-10-CM | POA: Diagnosis not present

## 2024-05-13 DIAGNOSIS — T68XXXA Hypothermia, initial encounter: Secondary | ICD-10-CM | POA: Diagnosis not present

## 2024-05-13 DIAGNOSIS — J9601 Acute respiratory failure with hypoxia: Secondary | ICD-10-CM | POA: Diagnosis not present

## 2024-05-13 LAB — BLOOD GAS, ARTERIAL
Acid-base deficit: 18.1 mmol/L — ABNORMAL HIGH (ref 0.0–2.0)
Bicarbonate: 11.9 mmol/L — ABNORMAL LOW (ref 20.0–28.0)
FIO2: 100 %
MECHVT: 480 mL
Mechanical Rate: 22
O2 Saturation: 100 %
PEEP: 5 cmH2O
Patient temperature: 37
pCO2 arterial: 43 mmHg (ref 32–48)
pH, Arterial: 7.05 — CL (ref 7.35–7.45)
pO2, Arterial: 200 mmHg — ABNORMAL HIGH (ref 83–108)

## 2024-05-13 MED ORDER — LORAZEPAM 2 MG/ML IJ SOLN
1.0000 mg | Freq: Once | INTRAMUSCULAR | Status: AC
  Administered 2024-05-13: 1 mg via INTRAVENOUS
  Filled 2024-05-13: qty 1

## 2024-05-13 MED ORDER — ZINC OXIDE 40 % EX OINT
1.0000 | TOPICAL_OINTMENT | Freq: Three times a day (TID) | CUTANEOUS | Status: DC
  Administered 2024-05-13 – 2024-05-15 (×7): 1 via TOPICAL
  Filled 2024-05-13: qty 113

## 2024-05-13 NOTE — Plan of Care (Signed)

## 2024-05-13 NOTE — Progress Notes (Signed)
 " PROGRESS NOTE    Larry French  FMW:[id number] DOB: 04-12-1961 DOA: 05/04/2024 PCP: [name], MD  Chief Complaint  Patient presents with   Altered Mental Status   Cold Exposure    Hospital Course:  Larry French is a 64 year old male with COPD, alcohol  and benzodiazepine abuse, hypertension, hyperlipidemia, CAD.  He had a complicated hospital course.  He initially presented to the ED after being found down outside his father's house with altered mental status.  On arrival he had a axillary temperature of 88, and was profoundly hypotensive, with severe anion gap metabolic acidosis.  He was initially admitted to the ICU on ventilator support and pressor support.  There was briefly a concern for potential toxic ethanol ingestion though ultimately this was determined to be unlikely.  While in ICU patient was found to be very agitated, perhaps withdrawing from alcohol .  On 1/18 patient was performing SBT and self extubated but was quickly in respiratory distress and required reintubation. He subsequently failed SBT.  ICU MD had extensive discussion with the family regarding potential tracheostomy in the future, palliative care was consulted.  Ultimately on 1/24 family decided to withdraw aggressive care and proceed with one-way extubation.  Patient was made comfort measures only and transferred to TRH service.  Subjective: No family present at bedside today.  He is awake and oriented to self.  When I shared with the patient that he was currently comfort measures only pending hospice he replies Larry French I asked the patient who he wanted to make decisions for him, to which she stated his father Larry French.  I attempted to elicit opinions regarding his care plan but he was very drowsy and fell back asleep.  Upon evaluation later in the day the patient is disoriented and speech is nonsensical.  He cannot participate in conversation or follow commands.  He is no longer answering questions as he  was earlier today  I attempted to reach his father, Larry French, no answer, no vm set up.  I was able to speak with his cousin, Larry French and provide updates.   Objective: Vitals:   05/12/24 1700 05/12/24 1939 05/13/24 0500 05/13/24 0830  BP:  (!) 144/77  (!) 152/31  Pulse: (!) 102 99  (!) 32  Resp: (!) 28 18  (!) 42  Temp:  97.8 F (36.6 C)  98.6 F (37 C)  TempSrc:  Oral    SpO2: 95% 93%  (!) 89%  Weight:   96.1 kg   Height:        Intake/Output Summary (Last 24 hours) at 05/13/2024 1404 Last data filed at 05/12/2024 2330 Gross per 24 hour  Intake 144 ml  Output 1050 ml  Net -906 ml   Filed Weights   05/11/24 0500 05/12/24 0500 05/13/24 0500  Weight: 96.1 kg 96.1 kg 96.1 kg    Examination: General exam: Appears calm and comfortable Respiratory system: No work of breathing, symmetric chest wall expansion Cardiovascular system: S1 & S2 heard, RRR.  Gastrointestinal system: Abdomen is distended, soft.  Does not appear to be tender Neuro: Awake, alert, speech is nonsensical.  Inconsistently following commands.  Assessment & Plan:  Principal Problem:   Acute respiratory failure (HCC) Active Problems:   Goals of care, counseling/discussion   Altered mental status   Pressure injury of skin    Comfort measures only - Family has decided to proceed with comfort measures only - Discontinue all lab draws, discontinue alarms. q shift only vitals. - Proceed with only  medications that provide immediate comfort for agitation, pain, anxiety -- Schedule morphine  if frequently requiring  - Foley catheter placed for comfort  Also treated during this hospitalization: Toxic metabolic encephalopathy Polysubstance abuse Alcohol  abuse Hypothermia Acute hypoxic respiratory failure Circulatory shock Suspected sepsis AKI Rhabdomyolysis Anion gap metabolic acidosis Atrial fibrillation Type 2 diabetes COPD Body mass index is 32.21 kg/m. Obesity Class 1   DVT prophylaxis: n/a   Code  Status: Do not attempt resuscitation (DNR) - Comfort care Disposition:  Outpatient hospice if he stabilizes  Consultants:    Procedures:    Antimicrobials:  Anti-infectives (From admission, onward)    Start     Dose/Rate Route Frequency Ordered Stop   05/04/24 0645  piperacillin -tazobactam (ZOSYN ) IVPB 3.375 g  Status:  Discontinued        3.375 g 12.5 mL/hr over 240 Minutes Intravenous Every 8 hours 05/04/24 0634 05/11/24 1122   05/04/24 0315  ceFEPIme  (MAXIPIME ) 2 g in sodium chloride  0.9 % 100 mL IVPB        2 g 200 mL/hr over 30 Minutes Intravenous  Once 05/04/24 0304 05/04/24 0440   05/04/24 0315  metroNIDAZOLE  (FLAGYL ) IVPB 500 mg        500 mg 100 mL/hr over 60 Minutes Intravenous  Once 05/04/24 0304 05/04/24 0737   05/04/24 0315  vancomycin  (VANCOCIN ) IVPB 1000 mg/200 mL premix        1,000 mg 200 mL/hr over 60 Minutes Intravenous  Once 05/04/24 0304 05/04/24 1213       Data Reviewed: I have personally reviewed following labs and imaging studies CBC: Recent Labs  Lab 05/07/24 0423 05/08/24 0239 05/09/24 0351 05/10/24 0240 05/11/24 0435 05/12/24 0338  WBC 6.7 5.7 6.0 7.1 6.2 5.5  NEUTROABS 3.6  --   --   --   --   --   HGB 10.8* 12.0* 12.2* 12.4* 12.7* 12.6*  HCT 31.8* 36.4* 37.4* 38.6* 39.3 40.4  MCV 94.1 97.1 98.2 100.0 100.3* 102.0*  PLT 89* 92* 126* 135* 157 156   Basic Metabolic Panel: Recent Labs  Lab 05/07/24 0423 05/08/24 0239 05/09/24 0343 05/09/24 0351 05/10/24 0240 05/11/24 0435 05/12/24 0338  NA 137 139  --  141 144 148* 148*  K 5.4* 3.5  --  3.9 4.0 3.6 3.9  CL 104 103  --  105 108 106 110  CO2 22 25  --  27 27 29 29   GLUCOSE 101* 124*  --  119* 131* 136* 117*  BUN 10 13  --  20 28* 25* 27*  CREATININE 0.90 0.81  --  0.92 0.78 0.79 0.66  CALCIUM  8.1* 8.4*  --  9.0 9.3 8.8* 9.0  MG 2.0 2.1 2.3  --  2.6* 2.4 2.6*  PHOS 2.7 3.0  --  3.5 3.6 3.8  --    GFR: Estimated Creatinine Clearance: 107.7 mL/min (by C-G formula based on SCr of  0.66 mg/dL). Liver Function Tests: Recent Labs  Lab 05/07/24 0423 05/08/24 0239 05/10/24 0240  AST  --  123* 61*  ALT  --  398* 201*  ALKPHOS  --  128* 116  BILITOT  --  1.7* 0.9  PROT  --  5.5* 6.1*  ALBUMIN 2.8* 2.8* 2.9*   CBG: Recent Labs  Lab 05/11/24 1935 05/11/24 2336 05/12/24 0325 05/12/24 0800 05/12/24 1144  GLUCAP 136* 143* 115* 156* 142*    Recent Results (from the past 240 hours)  Blood Culture (routine x 2)     Status: None  Collection Time: 05/04/24  3:18 AM   Specimen: BLOOD  Result Value Ref Range Status   Specimen Description BLOOD BLOOD RIGHT ARM  Final   Special Requests   Final    BOTTLES DRAWN AEROBIC AND ANAEROBIC Blood Culture adequate volume   Culture   Final    NO GROWTH 5 DAYS Performed at [facility], [address] [place], [place] [address]    Report Status 05/09/2024 FINAL  Final  Resp panel by RT-PCR (RSV, Flu A&B, Covid) Anterior Nasal Swab     Status: None   Collection Time: 05/04/24  3:22 AM   Specimen: Anterior Nasal Swab  Result Value Ref Range Status   SARS Coronavirus 2 by RT PCR NEGATIVE NEGATIVE Final    Comment: (NOTE) SARS-CoV-2 target nucleic acids are NOT DETECTED.  The SARS-CoV-2 RNA is generally detectable in upper respiratory specimens during the acute phase of infection. The lowest concentration of SARS-CoV-2 viral copies this assay can detect is 138 copies/mL. A negative result does not preclude SARS-Cov-2 infection and should not be used as the sole basis for treatment or other patient management decisions. A negative result may occur with  improper specimen collection/handling, submission of specimen other than nasopharyngeal swab, presence of viral mutation(s) within the areas targeted by this assay, and inadequate number of viral copies(<138 copies/mL). A negative result must be combined with clinical observations, patient history, and epidemiological information. The expected result is  Negative.  Fact Sheet for Patients:  bloggercourse.com  Fact Sheet for Healthcare Providers:  seriousbroker.it  This test is no t yet approved or cleared by the [place]  FDA and  has been authorized for detection and/or diagnosis of SARS-CoV-2 by FDA under an Emergency Use Authorization (EUA). This EUA will remain  in effect (meaning this test can be used) for the duration of the COVID-19 declaration under Section 564(b)(1) of the Act, 21 U.S.C.section 360bbb-3(b)(1), unless the authorization is terminated  or revoked sooner.       Influenza A by PCR NEGATIVE NEGATIVE Final   Influenza B by PCR NEGATIVE NEGATIVE Final    Comment: (NOTE) The Xpert Xpress SARS-CoV-2/FLU/RSV plus assay is intended as an aid in the diagnosis of influenza from Nasopharyngeal swab specimens and should not be used as a sole basis for treatment. Nasal washings and aspirates are unacceptable for Xpert Xpress SARS-CoV-2/FLU/RSV testing.  Fact Sheet for Patients: bloggercourse.com  Fact Sheet for Healthcare Providers: seriousbroker.it  This test is not yet approved or cleared by the [place]  FDA and has been authorized for detection and/or diagnosis of SARS-CoV-2 by FDA under an Emergency Use Authorization (EUA). This EUA will remain in effect (meaning this test can be used) for the duration of the COVID-19 declaration under Section 564(b)(1) of the Act, 21 U.S.C. section 360bbb-3(b)(1), unless the authorization is terminated or revoked.     Resp Syncytial Virus by PCR NEGATIVE NEGATIVE Final    Comment: (NOTE) Fact Sheet for Patients: bloggercourse.com  Fact Sheet for Healthcare Providers: seriousbroker.it  This test is not yet approved or cleared by the [place]  FDA and has been authorized for detection and/or diagnosis of  SARS-CoV-2 by FDA under an Emergency Use Authorization (EUA). This EUA will remain in effect (meaning this test can be used) for the duration of the COVID-19 declaration under Section 564(b)(1) of the Act, 21 U.S.C. section 360bbb-3(b)(1), unless the authorization is terminated or revoked.  Performed at [facility], [address] [place], [place] [address]  MRSA Next Gen by PCR, Nasal     Status: None   Collection Time: 05/04/24  9:38 AM   Specimen: Nasal Mucosa; Nasal Swab  Result Value Ref Range Status   MRSA by PCR Next Gen NOT DETECTED NOT DETECTED Final    Comment: (NOTE) The GeneXpert MRSA Assay (FDA approved for NASAL specimens only), is one component of a comprehensive MRSA colonization surveillance program. It is not intended to diagnose MRSA infection nor to guide or monitor treatment for MRSA infections. Test performance is not FDA approved in patients less than 77 years old. Performed at [facility], [address] [place], [place] [address]   Respiratory (~20 pathogens) panel by PCR     Status: None   Collection Time: 05/04/24  5:50 PM   Specimen: Nasopharyngeal Swab; Respiratory  Result Value Ref Range Status   Adenovirus NOT DETECTED NOT DETECTED Final   Coronavirus 229E NOT DETECTED NOT DETECTED Final    Comment: (NOTE) The Coronavirus on the Respiratory Panel, DOES NOT test for the novel  Coronavirus (2019 nCoV)    Coronavirus HKU1 NOT DETECTED NOT DETECTED Final   Coronavirus NL63 NOT DETECTED NOT DETECTED Final   Coronavirus OC43 NOT DETECTED NOT DETECTED Final   Metapneumovirus NOT DETECTED NOT DETECTED Final   Rhinovirus / Enterovirus NOT DETECTED NOT DETECTED Final   Influenza A NOT DETECTED NOT DETECTED Final   Influenza B NOT DETECTED NOT DETECTED Final   Parainfluenza Virus 1 NOT DETECTED NOT DETECTED Final   Parainfluenza Virus 2 NOT DETECTED NOT DETECTED Final   Parainfluenza Virus 3 NOT DETECTED NOT DETECTED Final    Parainfluenza Virus 4 NOT DETECTED NOT DETECTED Final   Respiratory Syncytial Virus NOT DETECTED NOT DETECTED Final   Bordetella pertussis NOT DETECTED NOT DETECTED Final   Bordetella Parapertussis NOT DETECTED NOT DETECTED Final   Chlamydophila pneumoniae NOT DETECTED NOT DETECTED Final   Mycoplasma pneumoniae NOT DETECTED NOT DETECTED Final    Comment: Performed at [facility], [address], [place], [place] [address]  Culture, Respiratory w Gram Stain     Status: None   Collection Time: 05/05/24  9:40 AM   Specimen: Tracheal Aspirate; Respiratory  Result Value Ref Range Status   Specimen Description   Final    TRACHEAL ASPIRATE Performed at [facility], [address], [place], [place] [address]    Special Requests   Final    NONE Performed at [facility], [address] [place], [place] [address]    Gram Stain   Final    RARE WBC SEEN FEW BUDDING YEAST SEEN Performed at [facility], [address], [place], [place] [address]    Culture FEW CANDIDA ALBICANS  Final   Report Status 05/07/2024 FINAL  Final  Culture, BAL-quantitative w Gram Stain     Status: None   Collection Time: 05/05/24  2:23 PM   Specimen: Bronchoalveolar Lavage; Respiratory  Result Value Ref Range Status   Specimen Description   Final    BRONCHIAL ALVEOLAR LAVAGE Performed at [facility], [address], [place], [place] [address]    Special Requests   Final    NONE Performed at [facility], [address] [place], [place] [address]    Gram Stain   Final    RARE WBC PRESENT, PREDOMINANTLY PMN MODERATE BUDDING YEAST SEEN Performed at [facility], [address], [place], [place] [address]    Culture MODERATE CANDIDA  ALBICANS  Final   Report Status 05/08/2024 FINAL  Final     Radiology Studies: No results found.  Scheduled Meds:  liver oil-zinc  oxide  1 Application Topical TID   Continuous Infusions:   LOS: 9 days  MDM:  Patient is high risk for one or more organ failure.  They necessitate ongoing hospitalization for continued IV therapies and subsequent lab monitoring. Total time spent interpreting labs and vitals, reviewing the medical record, coordinating care amongst consultants and care team members, directly assessing and discussing care with the patient and/or family: 55 min  Xiong Haidar, DO [facility]  To contact the attending physician between 7A-7P please use Epic Chat. To contact the covering physician during after hours 7P-7A, please review Amion.  05/13/2024, 2:04 PM   *This document has been created with the assistance of dictation software. Please excuse typographical errors. *   "

## 2024-05-14 DIAGNOSIS — R4182 Altered mental status, unspecified: Secondary | ICD-10-CM | POA: Diagnosis not present

## 2024-05-14 DIAGNOSIS — T68XXXA Hypothermia, initial encounter: Secondary | ICD-10-CM

## 2024-05-14 DIAGNOSIS — K7200 Acute and subacute hepatic failure without coma: Secondary | ICD-10-CM

## 2024-05-14 DIAGNOSIS — J9601 Acute respiratory failure with hypoxia: Secondary | ICD-10-CM

## 2024-05-14 MED ORDER — ACETAMINOPHEN 325 MG PO TABS
650.0000 mg | ORAL_TABLET | Freq: Four times a day (QID) | ORAL | Status: DC | PRN
  Administered 2024-05-14: 650 mg via ORAL
  Filled 2024-05-14: qty 2

## 2024-05-14 MED ORDER — HALOPERIDOL LACTATE 2 MG/ML PO CONC: 0.5000 mg | ORAL | Status: DC | PRN

## 2024-05-14 MED ORDER — HALOPERIDOL LACTATE 5 MG/ML IJ SOLN
0.5000 mg | INTRAMUSCULAR | Status: DC | PRN
  Administered 2024-05-14: 0.5 mg via INTRAVENOUS
  Filled 2024-05-14: qty 1

## 2024-05-14 MED ORDER — HYDROMORPHONE HCL-NACL 50-0.9 MG/50ML-% IV SOLN
1.0000 mg/h | INTRAVENOUS | Status: DC
  Administered 2024-05-14: 1 mg/h via INTRAVENOUS
  Filled 2024-05-14: qty 50

## 2024-05-14 MED ORDER — LORAZEPAM 2 MG/ML IJ SOLN
2.0000 mg | Freq: Four times a day (QID) | INTRAMUSCULAR | Status: DC
  Administered 2024-05-14: 2 mg via INTRAVENOUS
  Filled 2024-05-14: qty 1

## 2024-05-14 MED ORDER — LORAZEPAM 2 MG/ML IJ SOLN
2.0000 mg | INTRAMUSCULAR | Status: DC
  Administered 2024-05-14 – 2024-05-15 (×7): 2 mg via INTRAVENOUS
  Filled 2024-05-14 (×7): qty 1

## 2024-05-14 MED ORDER — HYDROMORPHONE HCL 1 MG/ML IJ SOLN
1.0000 mg | INTRAMUSCULAR | Status: DC | PRN
  Administered 2024-05-14 (×3): 1 mg via INTRAVENOUS
  Filled 2024-05-14 (×3): qty 1

## 2024-05-14 MED ORDER — HALOPERIDOL 0.5 MG PO TABS: 0.5000 mg | ORAL_TABLET | ORAL | Status: DC | PRN

## 2024-05-14 NOTE — Progress Notes (Signed)
 " PROGRESS NOTE    Larry French  FMW:[id number] DOB: Jun 11, 1960 DOA: 05/04/2024 PCP: [name], MD  Chief Complaint  Patient presents with   Altered Mental Status   Cold Exposure    Hospital Course:  Larry French is a 64 year old male with COPD, alcohol  and benzodiazepine abuse, hypertension, hyperlipidemia, CAD.  He had a complicated hospital course.  He initially presented to the ED after being found down outside his father's house with altered mental status.  On arrival he had a axillary temperature of 88, and was profoundly hypotensive, with severe anion gap metabolic acidosis.  He was initially admitted to the ICU on ventilator support and pressor support.  There was briefly a concern for potential toxic ethanol ingestion though ultimately this was determined to be unlikely.  While in ICU patient was found to be very agitated, perhaps withdrawing from alcohol .  On 1/18 patient was performing SBT and self extubated but was quickly in respiratory distress and required reintubation. He subsequently failed SBT.  ICU MD had extensive discussion with the family regarding potential tracheostomy in the future, palliative care was consulted.  Ultimately on 1/24 family decided to withdraw aggressive care and proceed with one-way extubation.  Patient was made comfort measures only and transferred to TRH service.  Subjective: Patient has been intermittently agitated today, trying to get out of bed.  Minimally responsive to Ativan  so far. At the time my evaluation his cousin [name] and her husband are at bedside.  We discussed the expected clinical course.  Objective: Vitals:   05/13/24 0830 05/13/24 2014 05/13/24 2026 05/14/24 0846  BP: (!) 152/31 (!) 160/69  (!) 178/70  Pulse: (!) 32 (!) 57  61  Resp: (!) 42 20  (!) 28  Temp: 98.6 F (37 C) (!) 100.8 F (38.2 C) 99.5 F (37.5 C) (!) 100.4 F (38 C)  TempSrc:   Oral Oral  SpO2: (!) 89% 93%  91%  Weight:      Height:         Intake/Output Summary (Last 24 hours) at 05/14/2024 1449 Last data filed at 05/13/2024 2014 Gross per 24 hour  Intake --  Output 350 ml  Net -350 ml   Filed Weights   05/11/24 0500 05/12/24 0500 05/13/24 0500  Weight: 96.1 kg 96.1 kg 96.1 kg    Examination: General exam: Lying sideways in the bed, legs hanging off.  Nursing working on repositioning Respiratory system: No work of breathing, symmetric chest wall expansion Cardiovascular system: S1 & S2 heard, RRR.  Neuro: Awake, alert, speech is nonsensical.  Is not following commands.  Trying to get out of bed, difficult to redirect  Assessment & Plan:  Principal Problem:   Acute respiratory failure (HCC) Active Problems:   Goals of care, counseling/discussion   Altered mental status   Pressure injury of skin    Comfort measures only - Family has decided to proceed with comfort measures only - Discontinue all lab draws, discontinue alarms. q shift only vitals. - Proceed only with medications that provide immediate comfort for agitation, pain, anxiety - Currently on scheduled Ativan , have increased to q4h dosing - Also has Haldol  and Versed  available as needed - Morphine  has been switched to Dilaudid  - Patient does have history of substance abuse and EtOH abuse.  Will likely require higher doses of benzodiazepines for effective management.  Monitor closely.  Titrate medications as needed - Palliative care consulted and assisting with symptom management - Hospice liaison to evaluate the patient  for inpatient hospice tomorrow - Foley catheter placed for comfort 1/25  Also treated during this hospitalization: Toxic metabolic encephalopathy Polysubstance abuse Alcohol  abuse Hypothermia Acute hypoxic respiratory failure Circulatory shock Suspected sepsis AKI Rhabdomyolysis Anion gap metabolic acidosis Atrial fibrillation Type 2 diabetes COPD Body mass index is 32.21 kg/m. Obesity Class 1   DVT prophylaxis:  n/a   Code Status: Do not attempt resuscitation (DNR) - Comfort care Disposition:  Outpatient hospice if he stabilizes  Consultants:    Procedures:    Antimicrobials:  Anti-infectives (From admission, onward)    Start     Dose/Rate Route Frequency Ordered Stop   05/04/24 0645  piperacillin -tazobactam (ZOSYN ) IVPB 3.375 g  Status:  Discontinued        3.375 g 12.5 mL/hr over 240 Minutes Intravenous Every 8 hours 05/04/24 0634 05/11/24 1122   05/04/24 0315  ceFEPIme  (MAXIPIME ) 2 g in sodium chloride  0.9 % 100 mL IVPB        2 g 200 mL/hr over 30 Minutes Intravenous  Once 05/04/24 0304 05/04/24 0440   05/04/24 0315  metroNIDAZOLE  (FLAGYL ) IVPB 500 mg        500 mg 100 mL/hr over 60 Minutes Intravenous  Once 05/04/24 0304 05/04/24 0737   05/04/24 0315  vancomycin  (VANCOCIN ) IVPB 1000 mg/200 mL premix        1,000 mg 200 mL/hr over 60 Minutes Intravenous  Once 05/04/24 0304 05/04/24 1213       Data Reviewed: I have personally reviewed following labs and imaging studies CBC: Recent Labs  Lab 05/08/24 0239 05/09/24 0351 05/10/24 0240 05/11/24 0435 05/12/24 0338  WBC 5.7 6.0 7.1 6.2 5.5  HGB 12.0* 12.2* 12.4* 12.7* 12.6*  HCT 36.4* 37.4* 38.6* 39.3 40.4  MCV 97.1 98.2 100.0 100.3* 102.0*  PLT 92* 126* 135* 157 156   Basic Metabolic Panel: Recent Labs  Lab 05/08/24 0239 05/09/24 0343 05/09/24 0351 05/10/24 0240 05/11/24 0435 05/12/24 0338  NA 139  --  141 144 148* 148*  K 3.5  --  3.9 4.0 3.6 3.9  CL 103  --  105 108 106 110  CO2 25  --  27 27 29 29   GLUCOSE 124*  --  119* 131* 136* 117*  BUN 13  --  20 28* 25* 27*  CREATININE 0.81  --  0.92 0.78 0.79 0.66  CALCIUM  8.4*  --  9.0 9.3 8.8* 9.0  MG 2.1 2.3  --  2.6* 2.4 2.6*  PHOS 3.0  --  3.5 3.6 3.8  --    GFR: Estimated Creatinine Clearance: 107.7 mL/min (by C-G formula based on SCr of 0.66 mg/dL). Liver Function Tests: Recent Labs  Lab 05/08/24 0239 05/10/24 0240  AST 123* 61*  ALT 398* 201*  ALKPHOS  128* 116  BILITOT 1.7* 0.9  PROT 5.5* 6.1*  ALBUMIN 2.8* 2.9*   CBG: Recent Labs  Lab 05/11/24 1935 05/11/24 2336 05/12/24 0325 05/12/24 0800 05/12/24 1144  GLUCAP 136* 143* 115* 156* 142*    Recent Results (from the past 240 hours)  Respiratory (~20 pathogens) panel by PCR     Status: None   Collection Time: 05/04/24  5:50 PM   Specimen: Nasopharyngeal Swab; Respiratory  Result Value Ref Range Status   Adenovirus NOT DETECTED NOT DETECTED Final   Coronavirus 229E NOT DETECTED NOT DETECTED Final    Comment: (NOTE) The Coronavirus on the Respiratory Panel, DOES NOT test for the novel  Coronavirus (2019 nCoV)    Coronavirus HKU1 NOT DETECTED  NOT DETECTED Final   Coronavirus NL63 NOT DETECTED NOT DETECTED Final   Coronavirus OC43 NOT DETECTED NOT DETECTED Final   Metapneumovirus NOT DETECTED NOT DETECTED Final   Rhinovirus / Enterovirus NOT DETECTED NOT DETECTED Final   Influenza A NOT DETECTED NOT DETECTED Final   Influenza B NOT DETECTED NOT DETECTED Final   Parainfluenza Virus 1 NOT DETECTED NOT DETECTED Final   Parainfluenza Virus 2 NOT DETECTED NOT DETECTED Final   Parainfluenza Virus 3 NOT DETECTED NOT DETECTED Final   Parainfluenza Virus 4 NOT DETECTED NOT DETECTED Final   Respiratory Syncytial Virus NOT DETECTED NOT DETECTED Final   Bordetella pertussis NOT DETECTED NOT DETECTED Final   Bordetella Parapertussis NOT DETECTED NOT DETECTED Final   Chlamydophila pneumoniae NOT DETECTED NOT DETECTED Final   Mycoplasma pneumoniae NOT DETECTED NOT DETECTED Final    Comment: Performed at [facility], [address], [place], [place] [address]  Culture, Respiratory w Gram Stain     Status: None   Collection Time: 05/05/24  9:40 AM   Specimen: Tracheal Aspirate; Respiratory  Result Value Ref Range Status   Specimen Description   Final    TRACHEAL ASPIRATE Performed at [facility], [address], [place], [place] [address]    Special Requests   Final     NONE Performed at [facility], [address] [place], [place] [address]    Gram Stain   Final    RARE WBC SEEN FEW BUDDING YEAST SEEN Performed at [facility], [address], [place], [place] [address]    Culture FEW CANDIDA ALBICANS  Final   Report Status 05/07/2024 FINAL  Final  Culture, BAL-quantitative w Gram Stain     Status: None   Collection Time: 05/05/24  2:23 PM   Specimen: Bronchoalveolar Lavage; Respiratory  Result Value Ref Range Status   Specimen Description   Final    BRONCHIAL ALVEOLAR LAVAGE Performed at [facility], [address], [place], [place] [address]    Special Requests   Final    NONE Performed at [facility], [address] [place], [place] [address]    Gram Stain   Final    RARE WBC PRESENT, PREDOMINANTLY PMN MODERATE BUDDING YEAST SEEN Performed at [facility], [address], [place], [place] [address]    Culture MODERATE CANDIDA ALBICANS  Final   Report Status 05/08/2024 FINAL  Final     Radiology Studies: No results found.  Scheduled Meds:  liver oil-zinc  oxide  1 Application Topical TID   LORazepam   2 mg Intravenous Q4H   Continuous Infusions:   LOS: 10 days  MDM: Patient is high risk for one or more organ failure.  They necessitate ongoing hospitalization for continued IV therapies and subsequent lab monitoring. Total time spent interpreting labs and vitals, reviewing the medical record, coordinating care amongst consultants and care team members, directly assessing and discussing care with the patient and/or family: 55 min  Dakia Schifano, DO [facility]  To contact the attending physician between 7A-7P please use Epic Chat. To contact the covering physician during after hours 7P-7A, please review Amion.  05/14/2024, 2:49 PM   *This document has been created with the assistance of dictation software. Please excuse typographical errors. *   "

## 2024-05-14 NOTE — Progress Notes (Signed)
 Pt is on Dilaudid  1mg /hr IV 45.8 mg remaining. Periods of restlessness.

## 2024-05-14 NOTE — Progress Notes (Signed)
 [facility] Liaison Note.  Referral received from ICM, Daved Rigg, RN, to evaluate patient for the [facility].  Hospice physician would like to see how patient does over the next day or so before making a determination.   HL will continue to follow.  Please call with any hospice related questions or concerns.    Thank you for the opportunity to participate in this patient's care.  [facility] Liaison [phone]

## 2024-05-14 NOTE — Progress Notes (Signed)
 "                                                                                                                                                                                               Palliative Care Progress Note, Assessment & Plan   Patient Name: Larry French       Date: 05/14/2024 DOB: [birth year]  Age: 64 y.o. MRN#: [id number] Attending Physician: Dezii, Alexandra, DO Primary Care Physician: [name], MD Admit Date: 05/04/2024  Subjective: Patient is lying in bed, fidgeting, moaning, and attempted to get out of the bed.  He does not meet my gaze but appears to be staring off beyond me.  His cousin [name] is present at bedside during my visit.  HPI:  Larry French is a 64 y.o. male with history of COPD, alcohol  and benzodiazepine abuse, hypertension, hyperlipidemia and CAD who presented to the [facility] ED after he was found down outside of his father's house with altered mental status.  On arrival to the ED, patient was hypothermic, hypotensive, and admitted to ICU on ventilatory support and pressor support.  On ICU, on 1/18, patient had SBT and self-extubated but was quickly respiratory distress requiring intubation.  Subsequently, patient failed SBT.  After discussion with ICU team, decision was made by family to withdraw aggressive care patient.  Patient was made comfort measures only.  Following up today for symptom management for comfort focused measures.  Summary of counseling/coordination of care: Chart review completed prior to meeting patient including:  -Vital signs: Last vital signs taken at 0826-hypertensive, tachycardic, tachypneic on 2 L of nasal cannula -Progress notes: As per attending's note on 1/25, hospice inpatient evaluation pending -Orders: Current to reflect as needed medications for agitation, pain, anxiety with Foley catheter in place  After reviewing the patient's chart and assessing the patient at bedside, I spoke with patient and his  cousin [name] present at bedside in regards to symptom management and goals of care.   Symptoms assessed.  Patient is able to say yes/no to some questions but does not respond to others.  Nonverbal signs of pain and discomfort noted-fidgeting, moaning, groaning, brow furrowing, attempting to get out of bed, and elevated respiratory rate.  Patient's cousin at bedside shares that morphine , Ativan , and Haldol  given recently have not been able to calm patient's agitation.  Reviewed MAR.  Discussed with attending and RN.  Advised morphine  be changed to Dilaudid  with Ativan  scheduled.  Attending in agreement.  Adjustments made to [facility].    I stayed present at bedside while nurse gave first dose of Dilaudid .  After administration, patient had  respiratory rate WNL, no longer appeared agitated and not attempting to climb out of bed.  Respirations were even and unlabored.  Education provided to patient and cousin at bedside in regards to aggressive symptom management at end-of-life.  Education provided on terminal agitation and various medications utilized to address this symptom.  Additionally, discussed patient's previous EtOH and substance use that may require more significant amounts of as needed medications to appropriately manage his end-of-life symptoms.  Patient's cousin endorsed understanding.  Again, patient is unable to fully comprehend complex medical decision making at this time.  Prior to leaving, patient appeared comfortable and relaxed.  Discussed with cousin at bedside that the next step is for hospice inpatient unit to evaluate patient for placement.  Additionally, if Dilaudid  is given often for keeping the patient comfortable, then initiation of Dilaudid  drip can occur.  She remained in agreement and was appreciative of my visit.  Therapeutic silence, active listening, and emotional support provided.  Cousin shares that she has relayed all information to patient's father.  He is aware that  patient continues on comfort measures and is awaiting hospice evaluation.  She shares that she and family are in agreement that they do not want the patient to suffer any pain. She believes he is not in pain but has seen him appear agitated and uncomfortable all morning-appears to be suffering.  She is grateful that the Dilaudid  is on board now and appears to be working appropriately.  Advised her that symptom management will continue to be evaluated and ongoing adjustments to be made to keep him as comfortable as possible.  Awaiting response from hospice as far as placement in IPU.  Secure chat sent to hospice liaison [place] and informed of above adjustments.  Full comfort measures continue.  Physical Exam HENT:     Head: Normocephalic.     Mouth/Throat:     Mouth: Mucous membranes are moist.  Eyes:     Pupils: Pupils are equal, round, and reactive to light.  Cardiovascular:     Rate and Rhythm: Tachycardia present.  Pulmonary:     Comments: tachypnic Abdominal:     Palpations: Abdomen is soft.  Neurological:     Comments: Responds appropriately to yes/no questions but intermittently responds Speaks to people not present in the room              Recommendations:   Awaiting hospice evaluation Discontinue morphine  Utilize Dilaudid  1 mg every 2 hours as needed for severe agitation and discomfort  I personally spent a total of 50 minutes in the care of the patient today including preparing to see the patient, getting/reviewing separately obtained history, performing a medically appropriate exam/evaluation, counseling and educating, placing orders, referring and communicating with other health care professionals, and documenting clinical information in the EHR.   Lamarr L. Arvid, DNP, FNP-BC Palliative Medicine Team   "

## 2024-05-14 NOTE — Progress Notes (Signed)
 Nutrition Brief Note  Chart reviewed. Pt now transitioning to comfort care.  No further nutrition interventions planned at this time.  Please re-consult as needed.   Margery ORN, RD, LDN, CDCES Registered Dietitian III Certified Diabetes Care and Education Specialist If unable to reach this RD, please use RD Inpatient group chat on secure chat between hours of 8am-4 pm daily

## 2024-05-14 NOTE — Plan of Care (Signed)
  Problem: Education: Goal: Ability to describe self-care measures that may prevent or decrease complications (Diabetes Survival Skills Education) will improve Outcome: Not Progressing Goal: Individualized Educational Video(s) Outcome: Not Progressing   Problem: Coping: Goal: Ability to adjust to condition or change in health will improve Outcome: Not Progressing   Problem: Fluid Volume: Goal: Ability to maintain a balanced intake and output will improve Outcome: Not Progressing   Problem: Health Behavior/Discharge Planning: Goal: Ability to identify and utilize available resources and services will improve Outcome: Not Progressing Goal: Ability to manage health-related needs will improve Outcome: Not Progressing   Problem: Metabolic: Goal: Ability to maintain appropriate glucose levels will improve Outcome: Not Progressing   Problem: Nutritional: Goal: Maintenance of adequate nutrition will improve Outcome: Not Progressing Goal: Progress toward achieving an optimal weight will improve Outcome: Not Progressing   Problem: Skin Integrity: Goal: Risk for impaired skin integrity will decrease Outcome: Not Progressing   Problem: Tissue Perfusion: Goal: Adequacy of tissue perfusion will improve Outcome: Not Progressing   Problem: Education: Goal: Knowledge of General Education information will improve Description: Including pain rating scale, medication(s)/side effects and non-pharmacologic comfort measures Outcome: Not Progressing   Problem: Health Behavior/Discharge Planning: Goal: Ability to manage health-related needs will improve Outcome: Not Progressing   Problem: Clinical Measurements: Goal: Ability to maintain clinical measurements within normal limits will improve Outcome: Not Progressing Goal: Will remain free from infection Outcome: Not Progressing Goal: Diagnostic test results will improve Outcome: Not Progressing Goal: Respiratory complications will  improve Outcome: Not Progressing Goal: Cardiovascular complication will be avoided Outcome: Not Progressing   Problem: Activity: Goal: Risk for activity intolerance will decrease Outcome: Not Progressing   Problem: Nutrition: Goal: Adequate nutrition will be maintained Outcome: Not Progressing   Problem: Coping: Goal: Level of anxiety will decrease Outcome: Not Progressing   Problem: Elimination: Goal: Will not experience complications related to bowel motility Outcome: Not Progressing Goal: Will not experience complications related to urinary retention Outcome: Not Progressing   Problem: Pain Managment: Goal: General experience of comfort will improve and/or be controlled Outcome: Not Progressing   Problem: Safety: Goal: Ability to remain free from injury will improve Outcome: Not Progressing   Problem: Skin Integrity: Goal: Risk for impaired skin integrity will decrease Outcome: Not Progressing   Problem: Education: Goal: Knowledge of the prescribed therapeutic regimen will improve Outcome: Not Progressing   Problem: Coping: Goal: Ability to identify and develop effective coping behavior will improve Outcome: Not Progressing   Problem: Clinical Measurements: Goal: Quality of life will improve Outcome: Not Progressing   Problem: Respiratory: Goal: Verbalizations of increased ease of respirations will increase Outcome: Not Progressing   Problem: Role Relationship: Goal: Family's ability to cope with current situation will improve Outcome: Not Progressing Goal: Ability to verbalize concerns, feelings, and thoughts to partner or family member will improve Outcome: Not Progressing   Problem: Pain Management: Goal: Satisfaction with pain management regimen will improve Outcome: Not Progressing

## 2024-05-15 DIAGNOSIS — Z7189 Other specified counseling: Secondary | ICD-10-CM | POA: Diagnosis not present

## 2024-05-15 DIAGNOSIS — L8990 Pressure ulcer of unspecified site, unspecified stage: Secondary | ICD-10-CM | POA: Diagnosis not present

## 2024-05-15 DIAGNOSIS — J9601 Acute respiratory failure with hypoxia: Secondary | ICD-10-CM | POA: Diagnosis not present

## 2024-05-15 DIAGNOSIS — R4182 Altered mental status, unspecified: Secondary | ICD-10-CM | POA: Diagnosis not present

## 2024-05-15 MED ORDER — HYDROMORPHONE BOLUS VIA INFUSION: 0.5000 mg | INTRAVENOUS | Status: DC | PRN

## 2024-05-15 NOTE — Progress Notes (Addendum)
 "                                                                                                                                                                                               Palliative Care Progress Note, Assessment & Plan   Patient Name: Larry French       Date: 05/15/2024 DOB: 06-Mar-1961  Age: 64 y.o. MRN#: [id number] Attending Physician: Dezii, Alexandra, DO Primary Care Physician: [name], MD Admit Date: 05/04/2024  Subjective: Patient is lying in bed on his right side.  He is asleep but easily awakens to my presence.  No family or friends are present at bedside during my visit.  RN is present at bedside during my assessment.  HPI: TANAV ORSAK is a 64 y.o. male with history of COPD, alcohol  and benzodiazepine abuse, hypertension, hyperlipidemia and CAD who presented to the [facility] ED after he was found down outside of his father's house with altered mental status.   On arrival to the ED, patient was hypothermic, hypotensive, and admitted to ICU on ventilatory support and pressor support.   On ICU, on 1/18, patient had SBT and self-extubated but was quickly respiratory distress requiring intubation.  Subsequently, patient failed SBT.  After discussion with ICU team, decision was made by family to withdraw aggressive care patient.  Patient was made comfort measures only.  Following up today for continued monitoring of comfort focused care and management of end-of-life symptoms.  Summary of counseling/coordination of care: Chart review completed prior to meeting patient including:  Given patient is on comfort measures only, no labs, vital signs, or other imaging is available.  Progress notes: As per secure chat with hospice liaison, patient has been accepted and approved for transfer to hospice home.  After reviewing the patient's chart, I assessed the patient at bedside.  He is unable to make vocalizations during my visit.  His gaze meets mine that  he does not attempt to speak or engage in discussions.  Respirations are even, unlabored.  Nonverbal signs of pain or distress not noted.  Patient is resting and appears to be comfortable at this time.  Reviewed Dilaudid  drip orders.  Adjusted MAR to reflect ability to bolus from IV bag versus as needed IV Dilaudid .  Additionally, broadened parameters of Dilaudid  drip to extend from just 1 mg/h to 1 to 10 mg/h based on clinical presentation.  Counseled with RN who understands adjustment to order.  Spoke with patient's cousin and updated her on above.  She was appreciative for palliative support.  No acute palliative needs at this time.  Plan remains for  patient to transfer to hospice home today.  PMT will continue to follow and support throughout his hospitalization.  Full comfort measures continue.   Addendum: 1630-received message from nurse tech stating that patient's father and friend were at bedside and asking questions about plan of care.  I came to bedside and spoke with patient's father Gaither and Roy's caregiver Quintin.  Gaither and Quintin share understanding that patient is dying and that he will be transferred to hospice shortly.  However, they are inquiring about how patient got to this point.  Discussed course of hospitalization from admission for hypothermia, use of ventilatory support, ongoing agitation, respiratory distress, extubation-reintubation.  Highlighted that family had extensive discussion with ICU team on 1/24 where they decided to withdraw aggressive care and proceed with one-way extubation.  Patient was transferred to MedSurg floor and full comfort measures were put in place.  Both Gaither and Quintin shared [place] of course of care and appreciation for our discussion.  Gaither asked if patient would be able to eat and drink at hospice inpatient facility.  Discussed terminal anorexia and likelihood that patient will not be eating and drinking as Gaither once knew him to do.  Reviewed  that if patient is awake, alert, and safely can take in p.o.'s while simultaneously asking for p.o.'s he will be given those.  However, at this stage I do not think patient will awaken at this capacity.  Additionally, Gaither asked how long it would be before his son passed away.  I shared that it would likely be hours to days given his current medical status.  Gaither asked what would be done at hospice if [place] had secretions.  Discussed medications can be given for secretions and oral care can be provided for comfort.  Again, discussed comfort measures involves focusing on symptom management as patient's natural disease process occurs.  Gaither and Quintin expressed appreciation for our discussion.  I offered for attending to come bedside to further discuss.  However, they politely declined saying that they have now had all of their questions answered.  I again confirmed with patient's father Gaither that plan remains to focus on patient's comfort and to transfer patient to hospice inpatient unit.  He confirmed he is in agreement for transfer and asks when the patient will leave the hospital.  Assured that patient is fourth on the list for transport but that does not give us  a clear sense of time.  After visiting with Gaither armin Quintin at bedside, I spoke with RN and charge RN.  Notified that there has been no change to plan of care.  Secure chat sent to attending Dr. Leesa, conveying I had fielded questions and discussed concerns from patient's family at bedside.  No change to plan of care at this time.  Physical Exam Vitals reviewed.  Constitutional:      General: He is not in acute distress.    Appearance: He is ill-appearing.  HENT:     Head: Normocephalic.     Mouth/Throat:     Mouth: Mucous membranes are dry.  Eyes:     Pupils: Pupils are equal, round, and reactive to light.  Cardiovascular:     Pulses: Normal pulses.  Pulmonary:     Effort: Pulmonary effort is normal.  Abdominal:      Palpations: Abdomen is soft.  Skin:    General: Skin is warm and dry.  Neurological:     Mental Status: He is alert.  Psychiatric:  Mood and Affect: Mood normal.        Behavior: Behavior normal.              Recommendations:   Comfort measures continue Plan to transfer to hospice home today  I personally spent a total of 70 minutes in the care of the patient today including preparing to see the patient, getting/reviewing separately obtained history, performing a medically appropriate exam/evaluation, counseling and educating, placing orders, and documenting clinical information in the EHR.   Lamarr L. Arvid, DNP, FNP-BC Palliative Medicine Team   "

## 2024-05-15 NOTE — Discharge Summary (Signed)
 " DISCHARGE SUMMARY    Larry French FMW:[id number] DOB: 1961-02-06 DOA: 05/04/2024  PCP: [name], MD  Admit date: 05/04/2024 Discharge date: 05/15/2024  Discharge to inpatient hospice   Hospital Course: Larry French is a 64 year old male with COPD, alcohol  and benzodiazepine abuse, hypertension, hyperlipidemia, CAD.  He had a complicated hospital course.  He initially presented to the ED after being found down outside his father's house with altered mental status.  On arrival he had a axillary temperature of 88, and was profoundly hypotensive, with severe anion gap metabolic acidosis.  He was initially admitted to the ICU on ventilator support and pressor support.  There was briefly a concern for potential toxic ethanol ingestion though ultimately this was determined to be unlikely.  While in ICU patient was found to be very agitated, perhaps withdrawing from alcohol .  On 1/18 patient was performing SBT and self extubated but was quickly in respiratory distress and required reintubation. He subsequently failed SBT.  ICU MD had extensive discussion with the family regarding potential tracheostomy in the future, palliative care was consulted.  Ultimately on 1/24 family decided to withdraw aggressive care and proceed with one-way extubation.  Patient was made comfort measures only and transferred to TRH service.  He is discharging today directly to inpatient hospice.    Comfort measures only - Family has decided to proceed with comfort measures only - Patient does have history of substance abuse and EtOH abuse.  Will likely require higher doses of benzodiazepines/opioids for effective management.  - Foley catheter placed for comfort 1/25 Discharge to inpatient hospice today   Also treated during this hospitalization: Toxic metabolic encephalopathy Polysubstance abuse Alcohol  abuse Hypothermia Acute hypoxic respiratory failure Circulatory shock Suspected  sepsis AKI Rhabdomyolysis Anion gap metabolic acidosis Atrial fibrillation Type 2 diabetes COPD Body mass index is 32.21 kg/m. Obesity Class 1 Discharge Instructions  Discharge Instructions     Call MD for:  difficulty breathing, headache or visual disturbances   Complete by: As directed    Call MD for:  persistant dizziness or light-headedness   Complete by: As directed    Call MD for:  persistant nausea and vomiting   Complete by: As directed    Call MD for:  severe uncontrolled pain   Complete by: As directed    Call MD for:  temperature >100.4   Complete by: As directed    Diet general   Complete by: As directed    No wound care   Complete by: As directed       Allergies as of 05/15/2024   No Known Allergies      Medication List     STOP taking these medications    Anoro Ellipta  62.5-25 MCG/ACT Aepb Generic drug: umeclidinium-vilanterol   aspirin  EC 81 MG tablet   clonazePAM  1 MG tablet Commonly known as: KLONOPIN    clopidogrel  75 MG tablet Commonly known as: PLAVIX    doxepin  25 MG capsule Commonly known as: SINEQUAN    gabapentin  300 MG capsule Commonly known as: NEURONTIN    hydrOXYzine  25 MG capsule Commonly known as: VISTARIL    lidocaine  5 % Commonly known as: LIDODERM    losartan  100 MG tablet Commonly known as: COZAAR    metFORMIN  500 MG tablet Commonly known as: GLUCOPHAGE    metoprolol  tartrate 25 MG tablet Commonly known as: LOPRESSOR    Mounjaro  10 MG/0.5ML Pen Generic drug: tirzepatide    Mounjaro  12.5 MG/0.5ML Pen Generic drug: tirzepatide    QUEtiapine  400 MG tablet Commonly known as: SEROQUEL   rosuvastatin  20 MG tablet Commonly known as: CRESTOR    traMADol  50 MG tablet Commonly known as: ULTRAM    traZODone  150 MG tablet Commonly known as: DESYREL         Allergies[1]  Consultations:    Procedures/Studies: DG Abd 1 View Result Date: 05/06/2024 EXAM: 1 VIEW XRAY OF THE ABDOMEN 05/06/2024 12:36:28 PM  COMPARISON: 05/04/2024 CLINICAL HISTORY: Encounter for orogastric tube placement FINDINGS: LINES, TUBES AND DEVICES: Enteric tube in place with side port and tip in the gastric body. BOWEL: Nonobstructive bowel gas pattern. SOFT TISSUES: No abnormal calcifications. BONES: No acute fracture. LUNGS: Patchy opacities in the bilateral lungs and interstitial coarsening. IMPRESSION: 1. Enteric tube in place with side port and tip in the gastric body. Electronically signed by: Waddell Calk MD 05/06/2024 01:09 PM EST RP Workstation: [id number]   DG Chest Port 1 View Result Date: 05/06/2024 EXAM: 1 VIEW(S) XRAY OF THE CHEST 05/06/2024 12:36:28 PM COMPARISON: 05/04/2024 CLINICAL HISTORY: Endotracheal tube present FINDINGS: LINES, TUBES AND DEVICES: Endotracheal tube unchanged in position. Enteric tube courses below diaphragm with tip obscured. LUNGS AND PLEURA: Possible small right pleural effusion. Interstitial prominence suggesting interstitial edema. No pneumothorax. HEART AND MEDIASTINUM: Unchanged cardiomegaly. BONES AND SOFT TISSUES: No acute osseous abnormality. No significant interval change. IMPRESSION: 1. Endotracheal tube unchanged in position, with enteric tube coursing below the diaphragm and tip obscured. 2. Possible small right pleural effusion. 3. Interstitial prominence suggesting interstitial edema. Electronically signed by: Norleen Boxer MD 05/06/2024 01:09 PM EST RP Workstation: [id number]   ECHOCARDIOGRAM COMPLETE Result Date: 05/05/2024    ECHOCARDIOGRAM REPORT   Patient Name:   Larry French Date of Exam: 05/05/2024 Medical Rec #:  [id number]            Height:       68.0 in Accession #:    [id number]           Weight:       229.1 lb Date of Birth:  09-01-1960            BSA:          2.165 m Patient Age:    63 years             BP:           119/65 mmHg Patient Gender: M                    HR:           96 bpm. Exam Location:  [facility] Procedure: 2D Echo, Color Doppler and Cardiac Doppler (Both  Spectral and Color            Flow Doppler were utilized during procedure). Indications:     R57.9 Shock  History:         Patient has prior history of Echocardiogram examinations. CAD                  and Previous Myocardial Infarction; Risk Factors:Hypertension                  and Polysubstance Abuse.  Sonographer:     L. Thornton-Maynard, RDCS Referring Phys:  [id number] ROBET KIM Diagnosing Phys: Redell Cave MD IMPRESSIONS  1. Left ventricular ejection fraction, by estimation, is 55 to 60%. The left ventricle has normal function. The left ventricle has no regional wall motion abnormalities. There is mild left ventricular hypertrophy. Left ventricular diastolic parameters were normal.  2. Right ventricular systolic function is normal. The right  ventricular size is normal.  3. The mitral valve is normal in structure. No evidence of mitral valve regurgitation.  4. The aortic valve is calcified. Aortic valve regurgitation is mild. Mild aortic valve stenosis. Aortic valve mean gradient measures 11.4 mmHg.  5. The inferior vena cava is normal in size with greater than 50% respiratory variability, suggesting right atrial pressure of 3 mmHg. FINDINGS  Left Ventricle: Left ventricular ejection fraction, by estimation, is 55 to 60%. The left ventricle has normal function. The left ventricle has no regional wall motion abnormalities. Global longitudinal strain performed but not reported based on interpreter judgement due to suboptimal tracking. The left ventricular internal cavity size was normal in size. There is mild left ventricular hypertrophy. Left ventricular diastolic parameters were normal. Right Ventricle: The right ventricular size is normal. No increase in right ventricular wall thickness. Right ventricular systolic function is normal. Left Atrium: Left atrial size was normal in size. Right Atrium: Right atrial size was normal in size. Pericardium: There is no evidence of pericardial effusion. Mitral  Valve: The mitral valve is normal in structure. No evidence of mitral valve regurgitation. Tricuspid Valve: The tricuspid valve is normal in structure. Tricuspid valve regurgitation is mild. Aortic Valve: The aortic valve is calcified. Aortic valve regurgitation is mild. Mild aortic stenosis is present. Aortic valve mean gradient measures 11.4 mmHg. Aortic valve peak gradient measures 21.3 mmHg. Aortic valve area, by VTI measures 1.99 cm. Pulmonic Valve: The pulmonic valve was normal in structure. Pulmonic valve regurgitation is trivial. Aorta: The aortic root and ascending aorta are structurally normal, with no evidence of dilitation. Venous: The inferior vena cava is normal in size with greater than 50% respiratory variability, suggesting right atrial pressure of 3 mmHg. IAS/Shunts: No atrial level shunt detected by color flow Doppler.  LEFT VENTRICLE PLAX 2D LVIDd:         4.30 cm      Diastology LVIDs:         2.60 cm      LV e' medial:   8.16 cm/s LV PW:         1.20 cm      LV E/e' medial: 13.5 LV IVS:        1.40 cm LVOT diam:     1.90 cm LV SV:         66 LV SV Index:   30 LVOT Area:     2.84 cm LV IVRT:       71 msec  LV Volumes (MOD) LV vol d, MOD A2C: 96.9 ml LV vol d, MOD A4C: 102.0 ml LV vol s, MOD A2C: 44.7 ml LV vol s, MOD A4C: 47.9 ml LV SV MOD A2C:     52.2 ml LV SV MOD A4C:     102.0 ml LV SV MOD BP:      52.0 ml RIGHT VENTRICLE             IVC RV Basal diam:  3.20 cm     IVC diam: 1.60 cm RV Mid diam:    2.20 cm RV S prime:     19.70 cm/s TAPSE (M-mode): 2.1 cm LEFT ATRIUM             Index        RIGHT ATRIUM           Index LA diam:        3.40 cm 1.57 cm/m   RA Area:     12.90 cm LA  Vol [facility]):   50.7 ml 23.42 ml/m  RA Volume:   28.80 ml  13.30 ml/m LA Vol (A4C):   21.1 ml 9.75 ml/m LA Biplane Vol: 36.1 ml 16.67 ml/m  AORTIC VALVE                     PULMONIC VALVE AV Area (Vmax):    2.00 cm      PV Vmax:          1.34 m/s AV Area (Vmean):   1.89 cm      PV Peak grad:     7.2 mmHg AV  Area (VTI):     1.99 cm      PR End Diast Vel: 7.18 msec AV Vmax:           230.80 cm/s AV Vmean:          153.200 cm/s AV VTI:            0.330 m AV Peak Grad:      21.3 mmHg AV Mean Grad:      11.4 mmHg LVOT Vmax:         163.00 cm/s LVOT Vmean:        102.000 cm/s LVOT VTI:          0.232 m LVOT/AV VTI ratio: 0.70  AORTA Ao Root diam: 3.20 cm Ao Asc diam:  3.70 cm MITRAL VALVE MV Area (PHT): 4.68 cm     SHUNTS MV Decel Time: 162 msec     Systemic VTI:  0.23 m MV E velocity: 110.00 cm/s  Systemic Diam: 1.90 cm MV A velocity: 105.00 cm/s MV E/A ratio:  1.05 Redell Cave MD Electronically signed by Redell Cave MD Signature Date/Time: 05/05/2024/3:45:47 PM    Final    CT CERVICAL SPINE WO CONTRAST Result Date: 05/04/2024 EXAM: CT CERVICAL SPINE WITHOUT CONTRAST 05/04/2024 05:08:55 AM TECHNIQUE: CT of the cervical spine was performed without the administration of intravenous contrast. Multiplanar reformatted images are provided for review. Automated exposure control, iterative reconstruction, and/or weight based adjustment of the mA/kV was utilized to reduce the radiation dose to as low as reasonably achievable. COMPARISON: None available. CLINICAL HISTORY: Polytrauma, blunt. FINDINGS: BONES AND ALIGNMENT: There is no evidence of fracture or acute traumatic injury. DEGENERATIVE CHANGES: There is mild diffuse degenerative disc disease throughout the cervical spine. SOFT TISSUES: Endotracheal and orogastric tubes are present. No prevertebral soft tissue swelling. IMPRESSION: 1. No evidence of fracture or acute traumatic injury. 2. Mild diffuse degenerative disc disease throughout the cervical spine. Electronically signed by: Evalene Coho MD 05/04/2024 05:52 AM EST RP Workstation: [id number]   CT CHEST ABDOMEN PELVIS WO CONTRAST Result Date: 05/04/2024 EXAM: CT CHEST, ABDOMEN AND PELVIS WITHOUT CONTRAST 05/04/2024 05:08:55 AM TECHNIQUE: CT of the chest, abdomen and pelvis was performed without the  administration of intravenous contrast. Multiplanar reformatted images are provided for review. Automated exposure control, iterative reconstruction, and/or weight based adjustment of the mA/kV was utilized to reduce the radiation dose to as low as reasonably achievable. COMPARISON: CT angiogram of the chest dated 04/19/2015. CLINICAL HISTORY: Polytrauma, blunt. FINDINGS: CHEST: MEDIASTINUM AND LYMPH NODES: Heart is enlarged. Moderate calcific coronary artery disease present. Endotracheal tube is present with its tip in the proximal right mainstem bronchus. The central airways are clear otherwise. No mediastinal, hilar or axillary lymphadenopathy. LUNGS AND PLEURA: Streaky consolidation/atelectasis present independently within the lower lobes bilaterally. Scattered patchy and reticular opacities within the right upper lobe. No pleural effusion. No  pneumothorax. ABDOMEN AND PELVIS: LIVER: Fatty infiltration of the liver. GALLBLADDER AND BILE DUCTS: Unremarkable. No biliary ductal dilatation. SPLEEN: No acute abnormality. PANCREAS: No acute abnormality. ADRENAL GLANDS: No acute abnormality. KIDNEYS, URETERS AND BLADDER: Small exophytic cysts arising laterally from the right kidney. Per consensus, no follow-up is needed for simple Bosniak type 1 and 2 renal cysts, unless the patient has a malignancy history or risk factors. No stones in the kidneys or ureters. No hydronephrosis. No perinephric or periureteral stranding. A Foley catheter is present within the urinary bladder. GI AND BOWEL: Nasogastric tube is present with its tip in the distal stomach. The bowel is unremarkable. There is no bowel obstruction. REPRODUCTIVE ORGANS: No acute abnormality. PERITONEUM AND RETROPERITONEUM: No ascites. No free air. VASCULATURE: Aorta is normal in caliber and demonstrates moderate calcific atheromatous disease. ABDOMINAL AND PELVIS LYMPH NODES: No lymphadenopathy. REPRODUCTIVE ORGANS: No acute abnormality. BONES AND SOFT  TISSUES: Healing or healed fractures of the right anterior and lateral fourth through ninth ribs. Mild-to-moderate chronic compression deformity of L1. Bilateral chronic spondylolysis of L5. No focal soft tissue abnormality. IMPRESSION: 1. Endotracheal tube tip in the proximal right mainstem bronchus; recommend retraction/repositioning. 2. Streaky dependent lower lobe atelectasis/consolidation bilaterally with scattered patchy reticular opacities in the right upper lobe. 3. Healing/healed fractures of the right anterior/lateral 4th through 9th ribs; no evidence of acute traumatic injury. 4. Cardiomegaly with moderate calcific coronary artery disease. 5. Hepatic steatosis and small exophytic right renal cysts. 6. Chronic mild-to-moderate L1 compression deformity and chronic bilateral L5 spondylolysis. Electronically signed by: Evalene Coho MD 05/04/2024 05:51 AM EST RP Workstation: [id number]   CT HEAD WO CONTRAST Result Date: 05/04/2024 EXAM: CT HEAD WITHOUT CONTRAST 05/04/2024 05:08:55 AM TECHNIQUE: CT of the head was performed without the administration of intravenous contrast. Automated exposure control, iterative reconstruction, and/or weight based adjustment of the mA/kV was utilized to reduce the radiation dose to as low as reasonably achievable. COMPARISON: CT of the head dated 04/05/2015. CLINICAL HISTORY: Head trauma, moderate-severe. FINDINGS: BRAIN AND VENTRICLES: No acute hemorrhage. No evidence of acute infarct. Patchy and confluent decreased attenuation throughout bilateral cerebral hemispheres deep and periventricular white matter, compatible with chronic microvascular ischemic disease. Chronic left thalamic lacunar infarctions. No hydrocephalus. No extra-axial collection. No mass effect or midline shift. Atherosclerotic calcifications within the cavernous internal carotid and vertebral arteries. ORBITS: No acute abnormality. SINUSES: No acute abnormality. SOFT TISSUES AND SKULL: No acute soft  tissue abnormality. No skull fracture. IMPRESSION: 1. No acute intracranial abnormality. 2. Chronic microvascular ischemic disease in bilateral cerebral hemispheres deep and periventricular white matter. 3. Chronic left thalamic lacunar infarctions. 4. Atherosclerotic calcifications within the cavernous internal carotid and vertebral arteries. Electronically signed by: Evalene Coho MD 05/04/2024 05:15 AM EST RP Workstation: [id number]   DG Abdomen 1 View Result Date: 05/04/2024 EXAM: 1 VIEW XRAY OF THE ABDOMEN 05/04/2024 04:07:00 AM COMPARISON: None available. CLINICAL HISTORY: OG placement OG placement FINDINGS: LINES, TUBES AND DEVICES: NGT is in place with the tip in the body of the stomach. BOWEL: Nonobstructive bowel gas pattern. There is moderate retained stool in the transverse colon. No dilated bowel is seen in the abdomen, with the pelvis not included in the image. There is no supine evidence of free air. SOFT TISSUES: There is abdominal aortic atherosclerosis. BONES: No acute fracture. LUNG BASES: Patchy consolidation is noted in both lung bases. IMPRESSION: 1. Enteric tube tip in the stomach. 2. Patchy consolidation in both lung bases. Electronically signed by: Francis Quam MD 05/04/2024 04:21  AM EST RP Workstation: [id number]   DG Chest Port 1 View Result Date: 05/04/2024 EXAM: 1 VIEW XRAY OF THE CHEST 05/04/2024 04:07:00 AM COMPARISON: CTA chest 04/19/2015. CLINICAL HISTORY: Questionable sepsis - evaluate for abnormality. FINDINGS: LINES, TUBES AND DEVICES: ETT is in place with the tip 2.4 cm from the carina. NGT is well within the stomach, the tip probably in the distal gastric body based on the position of the side hole. Multiple overlying monitor leads. LUNGS AND PLEURA: Patchy airspace consolidation in the base of both lungs consistent with pneumonia or aspiration. Mid and upper lungs remain clear. No pleural effusion. No pneumothorax. HEART AND MEDIASTINUM: Mild cardiomegaly. No findings  of acute CHF. The mediastinum is normally outlined with mild aortic atherosclerosis. BONES AND SOFT TISSUES: Healed fracture deformities of the right rib cage. Mild thoracic dextroscoliosis. IMPRESSION: 1. Patchy airspace consolidation in the base of both lungs, consistent with pneumonia or aspiration. Mid and upper lungs remain clear. 2. Endotracheal tube and nasogastric tube are in satisfactory position. Electronically signed by: Francis Quam MD 05/04/2024 04:17 AM EST RP Workstation: [id number]      Discharge Exam: Vitals:   05/15/24 0400 05/15/24 0925  BP: (!) 140/88 112/78  Pulse: 64 (!) 109  Resp:  15  Temp:  100.1 F (37.8 C)  SpO2:  91%   Vitals:   05/15/24 0032 05/15/24 0400 05/15/24 0500 05/15/24 0925  BP: (!) 150/90 (!) 140/88  112/78  Pulse: 68 64  (!) 109  Resp:    15  Temp:    100.1 F (37.8 C)  TempSrc:      SpO2:    91%  Weight:   87.7 kg   Height:        Constitutional:  NAD, calm Pulmonary: Non labored, symmetric rise of chest wall.  Neurological: tracks, does not speak or follow commands   The results of significant diagnostics from this hospitalization (including imaging, microbiology, ancillary and laboratory) are listed below for reference.     Microbiology: Recent Results (from the past 240 hours)  Culture, BAL-quantitative w Gram Stain     Status: None   Collection Time: 05/05/24  2:23 PM   Specimen: Bronchoalveolar Lavage; Respiratory  Result Value Ref Range Status   Specimen Description   Final    BRONCHIAL ALVEOLAR LAVAGE Performed at [facility], [address] [place], [place] [address]    Special Requests   Final    NONE Performed at [facility], [address] [place], [place] [address]    Gram Stain   Final    RARE WBC PRESENT, PREDOMINANTLY PMN MODERATE BUDDING YEAST SEEN Performed at [facility], [address], [place], [place] [address]    Culture MODERATE CANDIDA ALBICANS  Final   Report  Status 05/08/2024 FINAL  Final     Labs: BNP (last 3 results) No results for input(s): BNP in the last 8760 hours. Basic Metabolic Panel: Recent Labs  Lab 05/09/24 0343 05/09/24 0351 05/10/24 0240 05/11/24 0435 05/12/24 0338  NA  --  141 144 148* 148*  K  --  3.9 4.0 3.6 3.9  CL  --  105 108 106 110  CO2  --  27 27 29 29   GLUCOSE  --  119* 131* 136* 117*  BUN  --  20 28* 25* 27*  CREATININE  --  0.92 0.78 0.79 0.66  CALCIUM   --  9.0 9.3 8.8* 9.0  MG 2.3  --  2.6* 2.4  2.6*  PHOS  --  3.5 3.6 3.8  --    Liver Function Tests: Recent Labs  Lab 05/10/24 0240  AST 61*  ALT 201*  ALKPHOS 116  BILITOT 0.9  PROT 6.1*  ALBUMIN 2.9*   No results for input(s): LIPASE, AMYLASE in the last 168 hours. No results for input(s): AMMONIA in the last 168 hours. CBC: Recent Labs  Lab 05/09/24 0351 05/10/24 0240 05/11/24 0435 05/12/24 0338  WBC 6.0 7.1 6.2 5.5  HGB 12.2* 12.4* 12.7* 12.6*  HCT 37.4* 38.6* 39.3 40.4  MCV 98.2 100.0 100.3* 102.0*  PLT 126* 135* 157 156   Cardiac Enzymes: No results for input(s): CKTOTAL, CKMB, CKMBINDEX, TROPONINI in the last 168 hours. BNP: Invalid input(s): POCBNP CBG: Recent Labs  Lab 05/11/24 1935 05/11/24 2336 05/12/24 0325 05/12/24 0800 05/12/24 1144  GLUCAP 136* 143* 115* 156* 142*   D-Dimer No results for input(s): DDIMER in the last 72 hours. Hgb A1c No results for input(s): HGBA1C in the last 72 hours. Lipid Profile No results for input(s): CHOL, HDL, LDLCALC, TRIG, CHOLHDL, LDLDIRECT in the last 72 hours. Thyroid function studies No results for input(s): TSH, T4TOTAL, T3FREE, THYROIDAB in the last 72 hours.  Invalid input(s): FREET3 Anemia work up No results for input(s): VITAMINB12, FOLATE, FERRITIN, TIBC, IRON, RETICCTPCT in the last 72 hours. Urinalysis    Component Value Date/Time   COLORURINE AMBER (A) 05/04/2024 1750   APPEARANCEUR HAZY (A) 05/04/2024  1750   APPEARANCEUR Clear 03/05/2014 1542   LABSPEC 1.019 05/04/2024 1750   LABSPEC 1.005 03/05/2014 1542   PHURINE 5.0 05/04/2024 1750   GLUCOSEU NEGATIVE 05/04/2024 1750   GLUCOSEU Negative 03/05/2014 1542   HGBUR LARGE (A) 05/04/2024 1750   BILIRUBINUR NEGATIVE 05/04/2024 1750   BILIRUBINUR Negative 03/05/2014 1542   KETONESUR NEGATIVE 05/04/2024 1750   PROTEINUR 30 (A) 05/04/2024 1750   NITRITE NEGATIVE 05/04/2024 1750   LEUKOCYTESUR NEGATIVE 05/04/2024 1750   LEUKOCYTESUR Negative 03/05/2014 1542   Sepsis Labs Recent Labs  Lab 05/09/24 0351 05/10/24 0240 05/11/24 0435 05/12/24 0338  WBC 6.0 7.1 6.2 5.5   Microbiology Recent Results (from the past 240 hours)  Culture, BAL-quantitative w Gram Stain     Status: None   Collection Time: 05/05/24  2:23 PM   Specimen: Bronchoalveolar Lavage; Respiratory  Result Value Ref Range Status   Specimen Description   Final    BRONCHIAL ALVEOLAR LAVAGE Performed at [facility], [address], [place], [place] [address]    Special Requests   Final    NONE Performed at [facility], [address] [place], [place] [address]    Gram Stain   Final    RARE WBC PRESENT, PREDOMINANTLY PMN MODERATE BUDDING YEAST SEEN Performed at [facility], [address] [place], [place] [address]    Culture MODERATE CANDIDA ALBICANS  Final   Report Status 05/08/2024 FINAL  Final     Time coordinating discharge: 32 min   SIGNED: Aislynn Cifelli, DO [facility] 05/15/2024, 12:07 PM Pager   If 7PM-7AM, please contact night-coverage     [1] No Known Allergies  "

## 2024-05-15 NOTE — Progress Notes (Signed)
 [facility] Liaison Note  Patient approved for admission to the Hospice Home today.  PLease send DNR with patient.  RN, please call report to the hospice home at [phone].    [facility] Liaison Note [phone]

## 2024-05-15 NOTE — TOC Transition Note (Addendum)
 Transition of Care [facility]) - Discharge Note   Patient Details  Name: Larry French MRN: [id number] Date of Birth: [birth year]  Transition of Care [facility]) CM/SW Contact:  Daved JONETTA Hamilton, RN Phone Number: 05/15/2024, 1:36 PM   Clinical Narrative:     Patient will DC to: [facility] Anticipated DC date: 05/15/24 Family notified: Family aware Transport by: [facility] Trip/Auth# [id number]  Per MD patient ready for DC to [facility]. RN, patient's family, and facility notified of DC. RN given number for report.   TOC signing off.   Final next level of care: Hospice Medical Facility Barriers to Discharge: Barriers Resolved   Patient Goals and CMS Choice            Discharge Placement              Patient chooses bed at:  (Patient is going to [facility])) Patient to be transferred to facility by: [facility] Name of family member notified: Family aware Patient and family notified of of transfer: 05/15/24  Discharge Plan and Services Additional resources added to the After Visit Summary for                                       Social Drivers of Health (SDOH) Interventions SDOH Screenings   Food Insecurity: No Food Insecurity (01/11/2024)   Received from [facility]  Recent Concern: Food Insecurity - Food Insecurity Present (11/02/2023)   Received from [facility]  Housing: Low Risk  (01/11/2024)   Received from [facility]  Transportation Needs: No Transportation Needs (01/11/2024)   Received from [facility]  Recent Concern: Transportation Needs - Unmet Transportation Needs (11/02/2023)   Received from [facility]  Utilities: Not At Risk (01/11/2024)   Received from [facility]  Alcohol  Screen: Low Risk (02/11/2022)  Depression (PHQ2-9): Medium Risk (02/11/2022)  Financial Resource Strain: Low Risk   (01/11/2024)   Received from [facility]  Physical Activity: Sufficiently Active (02/11/2022)  Social Connections: Moderately Isolated (02/11/2022)  Stress: Stress Concern Present (02/11/2022)  Tobacco Use: High Risk (05/04/2024)     Readmission Risk Interventions     No data to display

## 2024-05-15 NOTE — Plan of Care (Signed)

## 2024-05-15 NOTE — TOC Progression Note (Addendum)
 Transition of Care [facility]) - Progression Note    Patient Details  Name: Larry French MRN: [id number] Date of Birth: [birth year]  Transition [facility]) CM/SW Contact  Daved JONETTA Hamilton, RN Phone Number: 05/15/2024, 11:07 AM  Clinical Narrative:     Patient will discharge to Hospice Home today, medical necessity form has been completed. Attempted to reach [facility] APS, lvmm requesting to return my call.   UPDATE 1:00pm-  Spoke with Neale Ferretti APS and advised her of patient's disposition, she verbalized understanding.                     Expected Discharge Plan and Services                                               Social Drivers of Health (SDOH) Interventions SDOH Screenings   Food Insecurity: No Food Insecurity (01/11/2024)   Received from [facility]  Recent Concern: Food Insecurity - Food Insecurity Present (11/02/2023)   Received from [facility]  Housing: Low Risk  (01/11/2024)   Received from [facility]  Transportation Needs: No Transportation Needs (01/11/2024)   Received from [facility]  Recent Concern: Transportation Needs - Unmet Transportation Needs (11/02/2023)   Received from [facility]  Utilities: Not At Risk (01/11/2024)   Received from [facility]  Alcohol  Screen: Low Risk (02/11/2022)  Depression (PHQ2-9): Medium Risk (02/11/2022)  Financial Resource Strain: Low Risk  (01/11/2024)   [facility]  Physical Activity: Sufficiently Active (02/11/2022)  Social Connections: Moderately Isolated (02/11/2022)  Stress: Stress Concern Present (02/11/2022)  Tobacco Use: High Risk (05/04/2024)    Readmission Risk Interventions     No data to display
